# Patient Record
Sex: Female | Born: 1940
Health system: Southern US, Community
[De-identification: ages and names within clinical notes are randomized; demographics above are authoritative.]

## PROBLEM LIST (undated history)

## (undated) DIAGNOSIS — K859 Acute pancreatitis without necrosis or infection, unspecified: Secondary | ICD-10-CM

## (undated) DIAGNOSIS — F329 Major depressive disorder, single episode, unspecified: Secondary | ICD-10-CM

## (undated) DIAGNOSIS — Z8659 Personal history of other mental and behavioral disorders: Secondary | ICD-10-CM

## (undated) DIAGNOSIS — T7840XA Allergy, unspecified, initial encounter: Secondary | ICD-10-CM

## (undated) DIAGNOSIS — I779 Disorder of arteries and arterioles, unspecified: Secondary | ICD-10-CM

## (undated) DIAGNOSIS — F419 Anxiety disorder, unspecified: Secondary | ICD-10-CM

## (undated) DIAGNOSIS — M65849 Other synovitis and tenosynovitis, unspecified hand: Secondary | ICD-10-CM

## (undated) DIAGNOSIS — I701 Atherosclerosis of renal artery: Secondary | ICD-10-CM

## (undated) DIAGNOSIS — M169 Osteoarthritis of hip, unspecified: Secondary | ICD-10-CM

## (undated) DIAGNOSIS — M65839 Other synovitis and tenosynovitis, unspecified forearm: Secondary | ICD-10-CM

## (undated) DIAGNOSIS — K219 Gastro-esophageal reflux disease without esophagitis: Secondary | ICD-10-CM

## (undated) DIAGNOSIS — IMO0001 Reserved for inherently not codable concepts without codable children: Secondary | ICD-10-CM

## (undated) DIAGNOSIS — R32 Unspecified urinary incontinence: Secondary | ICD-10-CM

## (undated) DIAGNOSIS — IMO0002 Reserved for concepts with insufficient information to code with codable children: Secondary | ICD-10-CM

## (undated) DIAGNOSIS — G458 Other transient cerebral ischemic attacks and related syndromes: Secondary | ICD-10-CM

## (undated) DIAGNOSIS — C44310 Basal cell carcinoma of skin of unspecified parts of face: Secondary | ICD-10-CM

## (undated) DIAGNOSIS — F32A Depression, unspecified: Secondary | ICD-10-CM

## (undated) DIAGNOSIS — G5760 Lesion of plantar nerve, unspecified lower limb: Secondary | ICD-10-CM

## (undated) DIAGNOSIS — R0989 Other specified symptoms and signs involving the circulatory and respiratory systems: Secondary | ICD-10-CM

## (undated) DIAGNOSIS — Z8711 Personal history of peptic ulcer disease: Secondary | ICD-10-CM

## (undated) DIAGNOSIS — D649 Anemia, unspecified: Secondary | ICD-10-CM

## (undated) DIAGNOSIS — C50919 Malignant neoplasm of unspecified site of unspecified female breast: Secondary | ICD-10-CM

## (undated) DIAGNOSIS — I739 Peripheral vascular disease, unspecified: Secondary | ICD-10-CM

## (undated) DIAGNOSIS — M67919 Unspecified disorder of synovium and tendon, unspecified shoulder: Secondary | ICD-10-CM

## (undated) DIAGNOSIS — F1021 Alcohol dependence, in remission: Secondary | ICD-10-CM

## (undated) DIAGNOSIS — G629 Polyneuropathy, unspecified: Secondary | ICD-10-CM

## (undated) DIAGNOSIS — Z981 Arthrodesis status: Secondary | ICD-10-CM

## (undated) DIAGNOSIS — R010 Benign and innocent cardiac murmurs: Secondary | ICD-10-CM

## (undated) DIAGNOSIS — F1910 Other psychoactive substance abuse, uncomplicated: Secondary | ICD-10-CM

## (undated) DIAGNOSIS — R911 Solitary pulmonary nodule: Secondary | ICD-10-CM

## (undated) DIAGNOSIS — Z5189 Encounter for other specified aftercare: Secondary | ICD-10-CM

## (undated) DIAGNOSIS — J42 Unspecified chronic bronchitis: Secondary | ICD-10-CM

## (undated) DIAGNOSIS — I10 Essential (primary) hypertension: Secondary | ICD-10-CM

## (undated) DIAGNOSIS — E039 Hypothyroidism, unspecified: Secondary | ICD-10-CM

## (undated) DIAGNOSIS — Z973 Presence of spectacles and contact lenses: Secondary | ICD-10-CM

## (undated) DIAGNOSIS — F988 Other specified behavioral and emotional disorders with onset usually occurring in childhood and adolescence: Secondary | ICD-10-CM

## (undated) DIAGNOSIS — N6009 Solitary cyst of unspecified breast: Secondary | ICD-10-CM

## (undated) HISTORY — DX: Arthrodesis status: Z98.1

## (undated) HISTORY — PX: WISDOM TOOTH EXTRACTION: SHX21

## (undated) HISTORY — DX: Other specified symptoms and signs involving the circulatory and respiratory systems: R09.89

## (undated) HISTORY — DX: Personal history of peptic ulcer disease: Z87.11

## (undated) HISTORY — DX: Solitary pulmonary nodule: R91.1

## (undated) HISTORY — DX: Malignant neoplasm of unspecified site of unspecified female breast: C50.919

## (undated) HISTORY — DX: Other synovitis and tenosynovitis, unspecified forearm: M65.839

## (undated) HISTORY — DX: Alcohol dependence, in remission: F10.21

## (undated) HISTORY — DX: Atherosclerosis of renal artery: I70.1

## (undated) HISTORY — DX: Disorder of arteries and arterioles, unspecified: I77.9

## (undated) HISTORY — DX: Allergy, unspecified, initial encounter: T78.40XA

## (undated) HISTORY — DX: Other specified behavioral and emotional disorders with onset usually occurring in childhood and adolescence: F98.8

## (undated) HISTORY — DX: Encounter for other specified aftercare: Z51.89

## (undated) HISTORY — PX: COLONOSCOPY: SHX174

## (undated) HISTORY — DX: Gastro-esophageal reflux disease without esophagitis: K21.9

## (undated) HISTORY — DX: Acute pancreatitis without necrosis or infection, unspecified: K85.90

## (undated) HISTORY — PX: TONSILLECTOMY AND ADENOIDECTOMY: SUR1326

## (undated) HISTORY — DX: Lesion of plantar nerve, unspecified lower limb: G57.60

## (undated) HISTORY — DX: Other synovitis and tenosynovitis, unspecified hand: M65.849

## (undated) HISTORY — DX: Solitary cyst of unspecified breast: N60.09

## (undated) HISTORY — DX: Osteoarthritis of hip, unspecified: M16.9

## (undated) HISTORY — DX: Hypothyroidism, unspecified: E03.9

## (undated) HISTORY — DX: Personal history of other mental and behavioral disorders: Z86.59

## (undated) HISTORY — PX: OTHER SURGICAL HISTORY: SHX169

## (undated) HISTORY — PX: JOINT REPLACEMENT: SHX530

## (undated) HISTORY — DX: Unspecified urinary incontinence: R32

## (undated) HISTORY — DX: Basal cell carcinoma of skin of unspecified parts of face: C44.310

## (undated) HISTORY — DX: Unspecified chronic bronchitis: J42

## (undated) HISTORY — PX: APPENDECTOMY: SHX54

## (undated) HISTORY — DX: Peripheral vascular disease, unspecified: I73.9

## (undated) HISTORY — DX: Reserved for inherently not codable concepts without codable children: IMO0001

## (undated) HISTORY — DX: Other transient cerebral ischemic attacks and related syndromes: G45.8

## (undated) HISTORY — DX: Essential (primary) hypertension: I10

## (undated) HISTORY — DX: Polyneuropathy, unspecified: G62.9

## (undated) HISTORY — DX: Benign and innocent cardiac murmurs: R01.0

## (undated) HISTORY — DX: Other psychoactive substance abuse, uncomplicated: F19.10

---

## 1997-10-31 ENCOUNTER — Ambulatory Visit (HOSPITAL_COMMUNITY): Admission: RE | Admit: 1997-10-31 | Discharge: 1997-10-31 | Payer: Self-pay | Admitting: *Deleted

## 1998-10-04 ENCOUNTER — Encounter: Payer: Self-pay | Admitting: Emergency Medicine

## 1998-10-04 ENCOUNTER — Emergency Department (HOSPITAL_COMMUNITY): Admission: EM | Admit: 1998-10-04 | Discharge: 1998-10-04 | Payer: Self-pay | Admitting: Emergency Medicine

## 1998-10-09 ENCOUNTER — Ambulatory Visit (HOSPITAL_BASED_OUTPATIENT_CLINIC_OR_DEPARTMENT_OTHER): Admission: RE | Admit: 1998-10-09 | Discharge: 1998-10-09 | Payer: Self-pay | Admitting: Orthopedic Surgery

## 1998-12-08 ENCOUNTER — Other Ambulatory Visit: Admission: RE | Admit: 1998-12-08 | Discharge: 1998-12-08 | Payer: Self-pay | Admitting: Obstetrics and Gynecology

## 1998-12-22 ENCOUNTER — Encounter: Payer: Self-pay | Admitting: Internal Medicine

## 1998-12-22 ENCOUNTER — Emergency Department (HOSPITAL_COMMUNITY): Admission: EM | Admit: 1998-12-22 | Discharge: 1998-12-22 | Payer: Self-pay | Admitting: Emergency Medicine

## 1999-03-16 HISTORY — PX: OTHER SURGICAL HISTORY: SHX169

## 1999-06-17 ENCOUNTER — Ambulatory Visit (HOSPITAL_COMMUNITY): Admission: RE | Admit: 1999-06-17 | Discharge: 1999-06-17 | Payer: Self-pay | Admitting: Family Medicine

## 1999-06-17 ENCOUNTER — Encounter: Payer: Self-pay | Admitting: Family Medicine

## 1999-07-09 ENCOUNTER — Ambulatory Visit (HOSPITAL_COMMUNITY): Admission: RE | Admit: 1999-07-09 | Discharge: 1999-07-09 | Payer: Self-pay | Admitting: Vascular Surgery

## 1999-07-09 ENCOUNTER — Encounter: Payer: Self-pay | Admitting: Vascular Surgery

## 1999-07-17 ENCOUNTER — Observation Stay (HOSPITAL_COMMUNITY): Admission: RE | Admit: 1999-07-17 | Discharge: 1999-07-18 | Payer: Self-pay | Admitting: Vascular Surgery

## 1999-07-17 ENCOUNTER — Encounter: Payer: Self-pay | Admitting: Vascular Surgery

## 1999-07-19 ENCOUNTER — Inpatient Hospital Stay (HOSPITAL_COMMUNITY): Admission: EM | Admit: 1999-07-19 | Discharge: 1999-07-20 | Payer: Self-pay

## 1999-11-13 ENCOUNTER — Encounter: Admission: RE | Admit: 1999-11-13 | Discharge: 1999-11-13 | Payer: Self-pay | Admitting: Vascular Surgery

## 1999-11-17 ENCOUNTER — Encounter: Payer: Self-pay | Admitting: Vascular Surgery

## 1999-11-17 ENCOUNTER — Encounter: Admission: RE | Admit: 1999-11-17 | Discharge: 1999-11-17 | Payer: Self-pay | Admitting: Vascular Surgery

## 1999-12-02 ENCOUNTER — Other Ambulatory Visit: Admission: RE | Admit: 1999-12-02 | Discharge: 1999-12-02 | Payer: Self-pay | Admitting: Obstetrics and Gynecology

## 1999-12-08 ENCOUNTER — Encounter: Admission: RE | Admit: 1999-12-08 | Discharge: 1999-12-08 | Payer: Self-pay | Admitting: Obstetrics and Gynecology

## 1999-12-08 ENCOUNTER — Encounter: Payer: Self-pay | Admitting: Obstetrics and Gynecology

## 2000-12-02 ENCOUNTER — Other Ambulatory Visit: Admission: RE | Admit: 2000-12-02 | Discharge: 2000-12-02 | Payer: Self-pay | Admitting: Obstetrics and Gynecology

## 2000-12-12 ENCOUNTER — Encounter: Payer: Self-pay | Admitting: Obstetrics and Gynecology

## 2000-12-12 ENCOUNTER — Encounter: Admission: RE | Admit: 2000-12-12 | Discharge: 2000-12-12 | Payer: Self-pay | Admitting: Obstetrics and Gynecology

## 2001-05-02 ENCOUNTER — Inpatient Hospital Stay (HOSPITAL_COMMUNITY): Admission: EM | Admit: 2001-05-02 | Discharge: 2001-05-08 | Payer: Self-pay

## 2001-05-02 ENCOUNTER — Encounter: Payer: Self-pay | Admitting: Internal Medicine

## 2001-05-05 ENCOUNTER — Encounter: Payer: Self-pay | Admitting: Internal Medicine

## 2001-06-30 ENCOUNTER — Emergency Department (HOSPITAL_COMMUNITY): Admission: EM | Admit: 2001-06-30 | Discharge: 2001-06-30 | Payer: Self-pay

## 2001-12-19 ENCOUNTER — Other Ambulatory Visit: Admission: RE | Admit: 2001-12-19 | Discharge: 2001-12-19 | Payer: Self-pay | Admitting: Obstetrics and Gynecology

## 2001-12-27 ENCOUNTER — Encounter: Payer: Self-pay | Admitting: Obstetrics and Gynecology

## 2001-12-27 ENCOUNTER — Encounter: Admission: RE | Admit: 2001-12-27 | Discharge: 2001-12-27 | Payer: Self-pay | Admitting: Obstetrics and Gynecology

## 2003-04-03 ENCOUNTER — Encounter: Admission: RE | Admit: 2003-04-03 | Discharge: 2003-04-03 | Payer: Self-pay | Admitting: Obstetrics and Gynecology

## 2003-12-25 ENCOUNTER — Inpatient Hospital Stay (HOSPITAL_COMMUNITY): Admission: RE | Admit: 2003-12-25 | Discharge: 2003-12-29 | Payer: Self-pay | Admitting: Orthopaedic Surgery

## 2004-01-02 ENCOUNTER — Emergency Department (HOSPITAL_COMMUNITY): Admission: EM | Admit: 2004-01-02 | Discharge: 2004-01-03 | Payer: Self-pay | Admitting: Emergency Medicine

## 2004-07-13 ENCOUNTER — Encounter: Payer: Self-pay | Admitting: Internal Medicine

## 2004-07-13 LAB — CONVERTED CEMR LAB

## 2004-07-13 LAB — HM MAMMOGRAPHY

## 2004-07-31 ENCOUNTER — Ambulatory Visit: Payer: Self-pay | Admitting: Family Medicine

## 2004-09-12 HISTORY — PX: LUMBAR FUSION: SHX111

## 2004-10-07 ENCOUNTER — Ambulatory Visit: Payer: Self-pay | Admitting: Internal Medicine

## 2005-01-21 ENCOUNTER — Ambulatory Visit: Payer: Self-pay | Admitting: Internal Medicine

## 2005-03-03 ENCOUNTER — Emergency Department (HOSPITAL_COMMUNITY): Admission: EM | Admit: 2005-03-03 | Discharge: 2005-03-03 | Payer: Self-pay | Admitting: Emergency Medicine

## 2005-03-15 HISTORY — PX: CERVICAL FUSION: SHX112

## 2005-06-10 ENCOUNTER — Encounter: Admission: RE | Admit: 2005-06-10 | Discharge: 2005-06-10 | Payer: Self-pay | Admitting: Orthopaedic Surgery

## 2005-07-08 ENCOUNTER — Ambulatory Visit: Payer: Self-pay | Admitting: Internal Medicine

## 2005-07-14 ENCOUNTER — Inpatient Hospital Stay (HOSPITAL_COMMUNITY): Admission: RE | Admit: 2005-07-14 | Discharge: 2005-07-15 | Payer: Self-pay | Admitting: Orthopaedic Surgery

## 2005-09-17 ENCOUNTER — Ambulatory Visit: Payer: Self-pay | Admitting: Internal Medicine

## 2005-11-06 ENCOUNTER — Emergency Department (HOSPITAL_COMMUNITY): Admission: EM | Admit: 2005-11-06 | Discharge: 2005-11-06 | Payer: Self-pay | Admitting: Family Medicine

## 2005-11-10 ENCOUNTER — Ambulatory Visit: Payer: Self-pay | Admitting: Internal Medicine

## 2005-11-20 ENCOUNTER — Emergency Department (HOSPITAL_COMMUNITY): Admission: EM | Admit: 2005-11-20 | Discharge: 2005-11-20 | Payer: Self-pay | Admitting: Family Medicine

## 2005-11-25 ENCOUNTER — Ambulatory Visit: Payer: Self-pay | Admitting: Internal Medicine

## 2005-11-29 ENCOUNTER — Ambulatory Visit: Payer: Self-pay | Admitting: Cardiology

## 2005-11-29 ENCOUNTER — Ambulatory Visit: Payer: Self-pay | Admitting: Internal Medicine

## 2006-02-15 ENCOUNTER — Ambulatory Visit: Payer: Self-pay | Admitting: Internal Medicine

## 2006-02-16 ENCOUNTER — Emergency Department (HOSPITAL_COMMUNITY): Admission: EM | Admit: 2006-02-16 | Discharge: 2006-02-16 | Payer: Self-pay | Admitting: Emergency Medicine

## 2006-03-15 HISTORY — PX: TOTAL HIP ARTHROPLASTY: SHX124

## 2006-03-25 ENCOUNTER — Ambulatory Visit: Payer: Self-pay | Admitting: Internal Medicine

## 2006-04-19 ENCOUNTER — Ambulatory Visit: Payer: Self-pay | Admitting: Internal Medicine

## 2006-04-19 LAB — CONVERTED CEMR LAB: TSH: 0.07 microintl units/mL — ABNORMAL LOW (ref 0.35–5.50)

## 2006-05-10 ENCOUNTER — Encounter: Admission: RE | Admit: 2006-05-10 | Discharge: 2006-05-10 | Payer: Self-pay | Admitting: Obstetrics and Gynecology

## 2006-06-15 ENCOUNTER — Ambulatory Visit: Payer: Self-pay | Admitting: Internal Medicine

## 2006-06-21 ENCOUNTER — Ambulatory Visit: Payer: Self-pay | Admitting: Internal Medicine

## 2006-07-18 ENCOUNTER — Encounter: Admission: RE | Admit: 2006-07-18 | Discharge: 2006-07-18 | Payer: Self-pay | Admitting: Orthopaedic Surgery

## 2006-08-09 ENCOUNTER — Encounter: Payer: Self-pay | Admitting: Internal Medicine

## 2006-08-09 DIAGNOSIS — R011 Cardiac murmur, unspecified: Secondary | ICD-10-CM | POA: Insufficient documentation

## 2006-08-09 DIAGNOSIS — I10 Essential (primary) hypertension: Secondary | ICD-10-CM | POA: Insufficient documentation

## 2006-08-09 DIAGNOSIS — K219 Gastro-esophageal reflux disease without esophagitis: Secondary | ICD-10-CM | POA: Insufficient documentation

## 2007-02-13 ENCOUNTER — Encounter: Payer: Self-pay | Admitting: Internal Medicine

## 2007-02-21 ENCOUNTER — Encounter: Payer: Self-pay | Admitting: *Deleted

## 2007-02-21 DIAGNOSIS — C44300 Unspecified malignant neoplasm of skin of unspecified part of face: Secondary | ICD-10-CM | POA: Insufficient documentation

## 2007-02-21 DIAGNOSIS — E039 Hypothyroidism, unspecified: Secondary | ICD-10-CM | POA: Insufficient documentation

## 2007-02-21 DIAGNOSIS — N6009 Solitary cyst of unspecified breast: Secondary | ICD-10-CM | POA: Insufficient documentation

## 2007-02-21 DIAGNOSIS — Z8711 Personal history of peptic ulcer disease: Secondary | ICD-10-CM | POA: Insufficient documentation

## 2007-02-21 DIAGNOSIS — F988 Other specified behavioral and emotional disorders with onset usually occurring in childhood and adolescence: Secondary | ICD-10-CM | POA: Insufficient documentation

## 2007-02-21 DIAGNOSIS — Z8659 Personal history of other mental and behavioral disorders: Secondary | ICD-10-CM | POA: Insufficient documentation

## 2007-02-21 DIAGNOSIS — C44309 Unspecified malignant neoplasm of skin of other parts of face: Secondary | ICD-10-CM | POA: Insufficient documentation

## 2007-02-21 DIAGNOSIS — Z9089 Acquired absence of other organs: Secondary | ICD-10-CM | POA: Insufficient documentation

## 2007-02-21 DIAGNOSIS — R0989 Other specified symptoms and signs involving the circulatory and respiratory systems: Secondary | ICD-10-CM | POA: Insufficient documentation

## 2007-02-21 DIAGNOSIS — G5760 Lesion of plantar nerve, unspecified lower limb: Secondary | ICD-10-CM | POA: Insufficient documentation

## 2007-04-25 ENCOUNTER — Encounter: Admission: RE | Admit: 2007-04-25 | Discharge: 2007-04-25 | Payer: Self-pay | Admitting: Orthopaedic Surgery

## 2007-05-04 ENCOUNTER — Telehealth: Payer: Self-pay | Admitting: Internal Medicine

## 2007-05-05 ENCOUNTER — Encounter: Payer: Self-pay | Admitting: Internal Medicine

## 2007-06-26 ENCOUNTER — Encounter: Payer: Self-pay | Admitting: Internal Medicine

## 2007-09-05 ENCOUNTER — Telehealth: Payer: Self-pay | Admitting: Internal Medicine

## 2007-09-07 ENCOUNTER — Telehealth: Payer: Self-pay | Admitting: Internal Medicine

## 2007-09-08 ENCOUNTER — Telehealth: Payer: Self-pay | Admitting: Internal Medicine

## 2007-10-11 ENCOUNTER — Ambulatory Visit: Payer: Self-pay | Admitting: Internal Medicine

## 2007-10-11 DIAGNOSIS — IMO0001 Reserved for inherently not codable concepts without codable children: Secondary | ICD-10-CM | POA: Insufficient documentation

## 2007-10-11 DIAGNOSIS — I701 Atherosclerosis of renal artery: Secondary | ICD-10-CM | POA: Insufficient documentation

## 2007-10-20 ENCOUNTER — Telehealth: Payer: Self-pay | Admitting: Internal Medicine

## 2007-12-25 ENCOUNTER — Telehealth: Payer: Self-pay | Admitting: Internal Medicine

## 2008-01-11 ENCOUNTER — Telehealth: Payer: Self-pay | Admitting: Internal Medicine

## 2008-01-23 ENCOUNTER — Telehealth (INDEPENDENT_AMBULATORY_CARE_PROVIDER_SITE_OTHER): Payer: Self-pay | Admitting: *Deleted

## 2008-01-23 ENCOUNTER — Ambulatory Visit: Payer: Self-pay | Admitting: Internal Medicine

## 2008-01-23 DIAGNOSIS — T8484XA Pain due to internal orthopedic prosthetic devices, implants and grafts, initial encounter: Secondary | ICD-10-CM | POA: Insufficient documentation

## 2008-01-23 DIAGNOSIS — Z96649 Presence of unspecified artificial hip joint: Secondary | ICD-10-CM

## 2008-01-29 ENCOUNTER — Encounter (INDEPENDENT_AMBULATORY_CARE_PROVIDER_SITE_OTHER): Payer: Self-pay | Admitting: *Deleted

## 2008-01-29 ENCOUNTER — Telehealth: Payer: Self-pay | Admitting: Internal Medicine

## 2008-02-05 ENCOUNTER — Encounter: Payer: Self-pay | Admitting: Internal Medicine

## 2008-02-27 ENCOUNTER — Telehealth: Payer: Self-pay | Admitting: Internal Medicine

## 2008-03-15 HISTORY — PX: ORIF TIBIA FRACTURE: SHX5416

## 2008-03-19 ENCOUNTER — Encounter: Payer: Self-pay | Admitting: Internal Medicine

## 2008-03-25 ENCOUNTER — Encounter: Admission: RE | Admit: 2008-03-25 | Discharge: 2008-04-30 | Payer: Self-pay | Admitting: Neurology

## 2008-03-26 ENCOUNTER — Telehealth: Payer: Self-pay | Admitting: Internal Medicine

## 2008-04-03 ENCOUNTER — Telehealth: Payer: Self-pay | Admitting: Internal Medicine

## 2008-04-10 ENCOUNTER — Encounter: Payer: Self-pay | Admitting: Internal Medicine

## 2008-04-12 ENCOUNTER — Telehealth: Payer: Self-pay | Admitting: Internal Medicine

## 2008-04-19 ENCOUNTER — Telehealth: Payer: Self-pay | Admitting: Internal Medicine

## 2008-04-24 ENCOUNTER — Encounter: Payer: Self-pay | Admitting: Internal Medicine

## 2008-05-01 ENCOUNTER — Encounter: Admission: RE | Admit: 2008-05-01 | Discharge: 2008-06-27 | Payer: Self-pay | Admitting: Orthopedic Surgery

## 2008-05-08 ENCOUNTER — Telehealth: Payer: Self-pay | Admitting: Internal Medicine

## 2008-06-05 ENCOUNTER — Encounter: Payer: Self-pay | Admitting: Internal Medicine

## 2008-07-16 ENCOUNTER — Encounter: Payer: Self-pay | Admitting: Internal Medicine

## 2008-07-17 ENCOUNTER — Telehealth: Payer: Self-pay | Admitting: Internal Medicine

## 2008-07-24 ENCOUNTER — Encounter: Payer: Self-pay | Admitting: Internal Medicine

## 2008-07-29 ENCOUNTER — Encounter: Admission: RE | Admit: 2008-07-29 | Discharge: 2008-08-27 | Payer: Self-pay | Admitting: Neurology

## 2008-08-02 ENCOUNTER — Encounter: Admission: RE | Admit: 2008-08-02 | Discharge: 2008-08-02 | Payer: Self-pay | Admitting: Neurology

## 2008-08-16 ENCOUNTER — Encounter: Payer: Self-pay | Admitting: Internal Medicine

## 2008-08-22 ENCOUNTER — Telehealth: Payer: Self-pay | Admitting: Internal Medicine

## 2008-09-03 ENCOUNTER — Telehealth (INDEPENDENT_AMBULATORY_CARE_PROVIDER_SITE_OTHER): Payer: Self-pay | Admitting: *Deleted

## 2008-09-12 HISTORY — PX: SHOULDER ARTHROSCOPY: SHX128

## 2008-09-18 ENCOUNTER — Ambulatory Visit: Payer: Self-pay | Admitting: Internal Medicine

## 2008-09-18 LAB — CONVERTED CEMR LAB
Cholesterol: 194 mg/dL (ref 0–200)
HDL: 59.2 mg/dL (ref >39.00)
LDL Cholesterol: 117 mg/dL — ABNORMAL HIGH (ref 0–99)
Total CHOL/HDL Ratio: 3
Triglycerides: 91 mg/dL (ref 0.0–149.0)
VLDL: 18.2 mg/dL (ref 0.0–40.0)

## 2008-09-20 ENCOUNTER — Ambulatory Visit: Payer: Self-pay | Admitting: Internal Medicine

## 2008-09-20 DIAGNOSIS — G458 Other transient cerebral ischemic attacks and related syndromes: Secondary | ICD-10-CM | POA: Insufficient documentation

## 2008-09-21 DIAGNOSIS — F1021 Alcohol dependence, in remission: Secondary | ICD-10-CM | POA: Insufficient documentation

## 2008-09-21 DIAGNOSIS — I6529 Occlusion and stenosis of unspecified carotid artery: Secondary | ICD-10-CM | POA: Insufficient documentation

## 2008-09-25 ENCOUNTER — Telehealth: Payer: Self-pay | Admitting: Internal Medicine

## 2008-09-28 ENCOUNTER — Encounter: Payer: Self-pay | Admitting: Internal Medicine

## 2008-09-30 ENCOUNTER — Encounter: Payer: Self-pay | Admitting: Internal Medicine

## 2008-10-01 ENCOUNTER — Ambulatory Visit (HOSPITAL_COMMUNITY): Admission: RE | Admit: 2008-10-01 | Discharge: 2008-10-01 | Payer: Self-pay | Admitting: Orthopedic Surgery

## 2008-10-09 ENCOUNTER — Encounter: Payer: Self-pay | Admitting: Internal Medicine

## 2008-10-17 ENCOUNTER — Encounter: Admission: RE | Admit: 2008-10-17 | Discharge: 2009-01-15 | Payer: Self-pay | Admitting: Orthopedic Surgery

## 2008-10-30 ENCOUNTER — Telehealth: Payer: Self-pay | Admitting: Internal Medicine

## 2008-11-01 ENCOUNTER — Encounter: Payer: Self-pay | Admitting: Internal Medicine

## 2008-11-07 ENCOUNTER — Telehealth: Payer: Self-pay | Admitting: Internal Medicine

## 2008-11-11 ENCOUNTER — Encounter: Payer: Self-pay | Admitting: Internal Medicine

## 2008-12-12 ENCOUNTER — Encounter: Payer: Self-pay | Admitting: Internal Medicine

## 2008-12-12 ENCOUNTER — Ambulatory Visit: Payer: Self-pay | Admitting: Vascular Surgery

## 2008-12-23 ENCOUNTER — Encounter: Payer: Self-pay | Admitting: Internal Medicine

## 2009-01-17 ENCOUNTER — Encounter: Payer: Self-pay | Admitting: Internal Medicine

## 2009-01-24 ENCOUNTER — Encounter: Payer: Self-pay | Admitting: Internal Medicine

## 2009-01-30 ENCOUNTER — Telehealth: Payer: Self-pay | Admitting: Internal Medicine

## 2009-02-12 ENCOUNTER — Encounter: Payer: Self-pay | Admitting: Internal Medicine

## 2009-02-18 ENCOUNTER — Encounter: Payer: Self-pay | Admitting: Internal Medicine

## 2009-02-19 ENCOUNTER — Emergency Department (HOSPITAL_BASED_OUTPATIENT_CLINIC_OR_DEPARTMENT_OTHER): Admission: EM | Admit: 2009-02-19 | Discharge: 2009-02-19 | Payer: Self-pay | Admitting: Emergency Medicine

## 2009-02-25 ENCOUNTER — Telehealth: Payer: Self-pay | Admitting: Internal Medicine

## 2009-04-24 ENCOUNTER — Encounter: Payer: Self-pay | Admitting: Internal Medicine

## 2009-04-24 ENCOUNTER — Ambulatory Visit: Payer: Self-pay | Admitting: Vascular Surgery

## 2009-05-05 ENCOUNTER — Emergency Department (HOSPITAL_BASED_OUTPATIENT_CLINIC_OR_DEPARTMENT_OTHER)
Admission: EM | Admit: 2009-05-05 | Discharge: 2009-05-05 | Payer: Self-pay | Source: Home / Self Care | Admitting: Emergency Medicine

## 2009-05-05 ENCOUNTER — Ambulatory Visit: Payer: Self-pay | Admitting: Diagnostic Radiology

## 2009-05-07 ENCOUNTER — Encounter: Payer: Self-pay | Admitting: Internal Medicine

## 2009-05-15 ENCOUNTER — Telehealth: Payer: Self-pay | Admitting: Internal Medicine

## 2009-05-16 ENCOUNTER — Telehealth: Payer: Self-pay | Admitting: Internal Medicine

## 2009-06-11 ENCOUNTER — Telehealth: Payer: Self-pay | Admitting: Internal Medicine

## 2009-06-17 ENCOUNTER — Emergency Department (HOSPITAL_BASED_OUTPATIENT_CLINIC_OR_DEPARTMENT_OTHER): Admission: EM | Admit: 2009-06-17 | Discharge: 2009-06-17 | Payer: Self-pay | Admitting: Emergency Medicine

## 2009-06-17 ENCOUNTER — Ambulatory Visit: Payer: Self-pay | Admitting: Diagnostic Radiology

## 2009-06-20 ENCOUNTER — Ambulatory Visit (HOSPITAL_BASED_OUTPATIENT_CLINIC_OR_DEPARTMENT_OTHER): Admission: RE | Admit: 2009-06-20 | Discharge: 2009-06-20 | Payer: Self-pay | Admitting: Orthopedic Surgery

## 2009-06-27 ENCOUNTER — Encounter: Admission: RE | Admit: 2009-06-27 | Discharge: 2009-09-25 | Payer: Self-pay | Admitting: Orthopedic Surgery

## 2009-06-30 ENCOUNTER — Encounter: Payer: Self-pay | Admitting: Internal Medicine

## 2009-07-25 ENCOUNTER — Telehealth: Payer: Self-pay | Admitting: Internal Medicine

## 2009-08-04 ENCOUNTER — Encounter: Payer: Self-pay | Admitting: Internal Medicine

## 2009-08-05 ENCOUNTER — Ambulatory Visit (HOSPITAL_BASED_OUTPATIENT_CLINIC_OR_DEPARTMENT_OTHER): Admission: RE | Admit: 2009-08-05 | Discharge: 2009-08-05 | Payer: Self-pay | Admitting: Orthopedic Surgery

## 2009-08-12 ENCOUNTER — Ambulatory Visit (HOSPITAL_COMMUNITY): Admission: RE | Admit: 2009-08-12 | Discharge: 2009-08-12 | Payer: Self-pay | Admitting: Orthopedic Surgery

## 2009-09-11 ENCOUNTER — Encounter: Payer: Self-pay | Admitting: Internal Medicine

## 2009-11-06 ENCOUNTER — Telehealth (INDEPENDENT_AMBULATORY_CARE_PROVIDER_SITE_OTHER): Payer: Self-pay | Admitting: *Deleted

## 2009-11-10 ENCOUNTER — Telehealth: Payer: Self-pay | Admitting: Internal Medicine

## 2009-11-10 ENCOUNTER — Encounter: Payer: Self-pay | Admitting: Internal Medicine

## 2009-11-21 ENCOUNTER — Ambulatory Visit: Payer: Self-pay | Admitting: Internal Medicine

## 2009-11-21 ENCOUNTER — Ambulatory Visit: Payer: Self-pay | Admitting: Vascular Surgery

## 2009-11-21 LAB — CONVERTED CEMR LAB
ALT: 29 units/L (ref 0–35)
AST: 17 units/L (ref 0–37)
Albumin: 3.9 g/dL (ref 3.5–5.2)
Alkaline Phosphatase: 64 units/L (ref 39–117)
BUN: 21 mg/dL (ref 6–23)
Basophils Absolute: 0 10*3/uL (ref 0.0–0.1)
Basophils Relative: 0.3 % (ref 0.0–3.0)
Bilirubin, Direct: 0.1 mg/dL (ref 0.0–0.3)
CO2: 32 meq/L (ref 19–32)
Calcium: 9.3 mg/dL (ref 8.4–10.5)
Chloride: 103 meq/L (ref 96–112)
Cholesterol: 205 mg/dL — ABNORMAL HIGH (ref 0–200)
Creatinine, Ser: 0.9 mg/dL (ref 0.4–1.2)
Direct LDL: 124.5 mg/dL
Eosinophils Absolute: 0.2 10*3/uL (ref 0.0–0.7)
Eosinophils Relative: 2.1 % (ref 0.0–5.0)
GFR calc non Af Amer: 63.15 mL/min (ref >60)
Glucose, Bld: 79 mg/dL (ref 70–99)
HCT: 35.9 % — ABNORMAL LOW (ref 36.0–46.0)
HDL: 56.1 mg/dL (ref >39.00)
Hemoglobin: 12.4 g/dL (ref 12.0–15.0)
Lymphocytes Relative: 30.8 % (ref 12.0–46.0)
Lymphs Abs: 2.2 10*3/uL (ref 0.7–4.0)
MCHC: 34.5 g/dL (ref 30.0–36.0)
MCV: 97.3 fL (ref 78.0–100.0)
Monocytes Absolute: 0.7 10*3/uL (ref 0.1–1.0)
Monocytes Relative: 9.4 % (ref 3.0–12.0)
Neutro Abs: 4.2 10*3/uL (ref 1.4–7.7)
Neutrophils Relative %: 57.4 % (ref 43.0–77.0)
Platelets: 183 10*3/uL (ref 150.0–400.0)
Potassium: 4.4 meq/L (ref 3.5–5.1)
RBC: 3.69 M/uL — ABNORMAL LOW (ref 3.87–5.11)
RDW: 14.9 % — ABNORMAL HIGH (ref 11.5–14.6)
Sed Rate: 11 mm/hr (ref 0–22)
Sodium: 141 meq/L (ref 135–145)
TSH: 0.47 microintl units/mL (ref 0.35–5.50)
Total Bilirubin: 0.7 mg/dL (ref 0.3–1.2)
Total CHOL/HDL Ratio: 4
Total Protein: 6.3 g/dL (ref 6.0–8.3)
Triglycerides: 125 mg/dL (ref 0.0–149.0)
VLDL: 25 mg/dL (ref 0.0–40.0)
WBC: 7.3 10*3/uL (ref 4.5–10.5)

## 2009-11-25 ENCOUNTER — Ambulatory Visit: Payer: Self-pay | Admitting: Internal Medicine

## 2009-11-25 DIAGNOSIS — G609 Hereditary and idiopathic neuropathy, unspecified: Secondary | ICD-10-CM | POA: Insufficient documentation

## 2009-11-26 DIAGNOSIS — M65849 Other synovitis and tenosynovitis, unspecified hand: Secondary | ICD-10-CM

## 2009-11-26 DIAGNOSIS — M65839 Other synovitis and tenosynovitis, unspecified forearm: Secondary | ICD-10-CM | POA: Insufficient documentation

## 2009-11-28 ENCOUNTER — Encounter: Admission: RE | Admit: 2009-11-28 | Discharge: 2009-11-28 | Payer: Self-pay | Admitting: Orthopaedic Surgery

## 2010-04-05 ENCOUNTER — Encounter: Payer: Self-pay | Admitting: Internal Medicine

## 2010-04-05 ENCOUNTER — Encounter: Payer: Self-pay | Admitting: Obstetrics and Gynecology

## 2010-04-06 ENCOUNTER — Encounter: Payer: Self-pay | Admitting: Neurology

## 2010-04-08 ENCOUNTER — Encounter
Admission: RE | Admit: 2010-04-08 | Discharge: 2010-04-08 | Payer: Self-pay | Source: Home / Self Care | Attending: Obstetrics and Gynecology | Admitting: Obstetrics and Gynecology

## 2010-04-14 NOTE — Progress Notes (Signed)
Summary: LAB ORDER  Phone Note Call from Patient   Summary of Call: Patient is requesting order for labs. Especially for thyroid. Is this ok? What labs are pt's due for?  Initial call taken by: Lamar Sprinkles, CMA,  November 06, 2009 4:48 PM  Follow-up for Phone Call        last OV July '10. Had labs Nov '10.  1) needs annual exam 2) TSH 244.9, metabolic 401.9, CBC 530.81, hepatic V11.3, ESR 729.1 Follow-up by: Jacques Navy MD,  November 06, 2009 5:00 PM  Additional Follow-up for Phone Call Additional follow up Details #1::        left mess to call office back.............Marland KitchenLamar Sprinkles, CMA  November 06, 2009 5:12 PM   Patient is requesting to have cholesterol added to above orders. OK?  Additional Follow-up by: Lamar Sprinkles, CMA,  November 19, 2009 12:08 PM    Additional Follow-up for Phone Call Additional follow up Details #2::    THE LABS ARE IN [facility].  YEARLY FU IS ON SEPT 13.  LMOM FOR HER TO CALL SO WE CAN TELL HER TO GO TO THE LAB BEFORE THE APPT. Follow-up by: Hilarie Fredrickson,  November 13, 2009 10:57 AM  Additional Follow-up for Phone Call Additional follow up Details #3:: Details for Additional Follow-up Action Taken: ok for lipid 443.10  Additional Follow-up by: Jacques Navy MD,  November 19, 2009 3:30 PM

## 2010-04-14 NOTE — Letter (Signed)
Summary: [facility]  [facility]   Imported By: Sherian Rein 05/16/2009 07:44:34  _____________________________________________________________________  External Attachment:    Type:   Image     Comment:   External Document

## 2010-04-14 NOTE — Progress Notes (Signed)
Summary: DIOVAN RX  Phone Note Refill Request   Refills Requested: Medication #1:  DIOVAN 160 MG TABS 1 by mouth once daily MAIL RX for 90 day supply to pt's home, she wants to get med thru [facility].   Initial call taken by: Lamar Sprinkles, CMA,  Jul 25, 2009 3:14 PM  Follow-up for Phone Call        k, we will provide written Rx for her to fill where ever she chooses. Follow-up by: Jacques Navy MD,  Jul 28, 2009 5:01 AM  Additional Follow-up for Phone Call Additional follow up Details #1::        Mailed Additional Follow-up by: Lamar Sprinkles, CMA,  Jul 28, 2009 11:53 AM    Prescriptions: DIOVAN 160 MG TABS (VALSARTAN) 1 by mouth once daily  #90 x 3   Entered by:   Lamar Sprinkles, CMA   Authorized by:   Jacques Navy MD   Signed by:   Lamar Sprinkles, CMA on 07/28/2009   Method used:   Print then Give to Patient   RxID:   [id number]

## 2010-04-14 NOTE — Assessment & Plan Note (Signed)
Summary: YEARLY FU /MEDICARE/ LABS NEXT DAY/DIDN'T WANT TO WAIT FOR A ...   Vital Signs:  Patient profile:   70 year old female Height:      65 inches Weight:      121.50 pounds BMI:     20.29 O2 Sat:      96 % on Room air Temp:     97.8 degrees F oral Pulse rate:   76 / minute BP sitting:   128 / 62  (left arm) Cuff size:   regular  Vitals Entered By: Rose [name] CMA (November 25, 2009 2:53 PM)  O2 Flow:  Room air CC: pt here for cpx/ ab Comments Pt is due for eye exam   Primary Care Provider:  Tamberlyn [name]  CC:  pt here for cpx/ ab.  History of Present Illness: patient presents for a wellness/prevention exam and routine medical follow-up.   she is generally feeling well except for left hip pain. She reports that she has severe pain with limited walking. the pain is in the left hip which has been replaced. she has had x-rays - no abnormality. She has had intra-articular injection - no help; oral steroids - no help. she is sheduled for MRI and consult with Rose [name].  Peripheral neuropathy - she gets good relief with gabapentin and lyrica which she says works well in combination.   Feb '10 - follow up with Dr. Edilia [name] for Carotid artery disease: doppler without progressive disease - 40-59% occlusion bilaterally  Hand surgeon for injection for right thumb tenosynovitis Nov and Dec '10  Despite her hip pain she reamins 100% independent in ADLs. She shops, pays her bills, balances her check book and has normal cognitive function.   Preventive Screening-Counseling & Management  Alcohol-Tobacco     Alcohol drinks/day: 0     Smoking Status: quit  Caffeine-Diet-Exercise     Diet Comments: healthy     Does Patient Exercise: physical therapy     MSH Depression Score: not depressed  Hep-HIV-STD-Contraception     Hepatitis Risk: no risk noted     HIV Risk: no risk noted     Dental Visit-last 6 months yes     Sun Exposure-Excessive: no  Safety-Violence-Falls     Seat Belt Use:  yes     Firearms in the Home: no firearms in the home     Smoke Detectors: yes     Sexual Abuse: no     Fall Risk: moderate with hip pain     Fall Risk Counseling: yes  Current Medications (verified): 1)  Aspirin 81 Mg Tbec (Aspirin) .... Take 1 Tablet By Mouth Two Times A Day 2)  Detrol La 4 Mg Cp24 (Tolterodine Tartrate) .... Take 1 Capsule By Mouth Once A Day 3)  Glucosamine Sulfate 500 Mg Tabs (Glucosamine Sulfate) .... Take 1 Tablet By Mouth Two Times A Day 4)  Nexium 40 Mg Cpdr (Esomeprazole Magnesium) .Marland Kitchen.. 1 Capsule By Mouth Once A Day On M, W, F. 5)  Amlodipine Besylate 10 Mg Tabs (Amlodipine Besylate) .Marland Kitchen.. 1 By Mouth Once Daily 6)  Skelaxin 800 Mg Tabs (Metaxalone) .... Take 1 Tablet By Mouth Three Times A Day 7)  Sm Calcium/vitamin D 500-200 Mg-Unit Tabs (Calcium Carbonate-Vitamin D) .... Take 2 Tablet By Mouth Once A Day 8)  Synthroid 125 Mcg Tabs (Levothyroxine Sodium) .Marland Kitchen.. 1 By Mouth Once Daily 9)  Wellbutrin Xl 300 Mg Tb24 (Bupropion Hcl) .... Take 1 Tablet By Mouth Once A Day 10)  Diovan  160 Mg Tabs (Valsartan) .Marland Kitchen.. 1 By Mouth Once Daily 11)  Cymbalta 60 Mg Cpep (Duloxetine Hcl) .... Take 1 Tablet By Mouth Once A Day 12)  Furosemide 40 Mg Tabs (Furosemide) .Marland Kitchen.. 1 By Mouth Once Daily 13)  Fish Oil Otc .... Take 1 Cap By Mouth Three Times A Day 14)  Lyrica 100 Mg Caps (Pregabalin) .... Take 1 Tablet By Mouth Three Times A Day 15)  Neurontin 600 Mg Tabs (Gabapentin) .... Take 2 Tablet By Mouth Three Times A Day 16)  Slow Fe 160 (50 Fe) Mg Cr-Tabs (Ferrous Sulfate Dried) 17)  Wellbutrin Xl 150 Mg Xr24h-Tab (Bupropion Hcl) .... 1/2 At Bedtime  Allergies (verified): 1)  ! Vicodin 2)  ! 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 3 daughters - '61, '70, '71; 2 sons - '53, '55 (schizophrenic-died); 10 grandchildren retired Secretary/administrator, had own firm Lives alone with her two dogs.  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) CAROTID ARTERY DISEASE (ICD-433.10) ALCOHOL ABUSE, HX OF (ICD-V11.3) DEGENERATIVE JOINT DISEASE, LEFT HIP (ICD-715.95) FIBROMYALGIA (ICD-729.1) RENAL ARTERY STENOSIS (ICD-440.1) DEPRESSION, HX OF (ICD-V11.8) MORTON'S NEUROMA, LEFT (ICD-355.6) CAROTID BRUIT, LEFT (ICD-785.9) * SPINAL FUSION SURGERY DUODENAL ULCER, HX OF (ICD-V12.71) Hx of BREAST CYST (ICD-610.0) CARCINOMA, BASAL CELL, FACE (ICD-173.3) ATTENTION DEFICIT DISORDER, ADULT (ICD-314.00) HYPOTHYROIDISM (ICD-244.9) * LEFT BRACHIAL ARTERY REPAIR OF PSEUDOANEURYSM * PERCUTANOUS TRANSLUMINAL ANGIOPLASTY OF RENAL ARTERIES HEART MURMUR, BENIGN (ICD-785.2) GERD (ICD-530.81) HYPERTENSION (ICD-401.9)   * LEFT BRACHIAL ARTERY REPAIR OF PSEUDOANEURYSM * PERCUTANOUS TRANSLUMINAL ANGIOPLASTY OF RENAL ARTERIES HEART MURMUR, BENIGN (ICD-785.2) GERD (ICD-530.81) HYPERTENSION (ICD-401.9)        Past Surgical History: APPENDECTOMY, HX OF (ICD-V45.79) TONSILLECTOMY AND ADENOIDECTOMY, HX OF (ICD-V45.79) * LEFT BRACHIAL ARTERY REPAIR OF PSEUDOANEURYSM * PERCUTANOUS TRANSLUMINAL ANGIOPLASTY OF RENAL ARTERIES Lumbar fusion July '06  T12-L5 (Rose [name]) Cervical fusion '07 (Rose [name]) Total hip replacement '09 Left Shoulder Arthroscopy July '10 ORIF left distal tibial fx '10  Social History: Does Patient Exercise:  physical therapy Hepatitis Risk:  no risk noted HIV Risk:  no risk noted Dental Care w/in 6 mos.:  yes Sun Exposure-Excessive:  no Seat Belt Use:  yes Fall Risk:  moderate with hip pain  Review of Systems       The patient complains of decreased hearing.  The patient denies  anorexia, fever, weight loss, weight gain, vision loss, hoarseness, chest pain, syncope, dyspnea on exertion, peripheral edema, prolonged cough, headaches, abdominal pain, severe indigestion/heartburn, hematuria, incontinence, muscle weakness, suspicious skin lesions, transient blindness, difficulty walking, depression, unusual weight change, abnormal bleeding, enlarged lymph nodes, angioedema, and breast masses.    Physical Exam  General:  Thin white female i no distress Head:  normocephalic, atraumatic, and no abnormalities observed.   Eyes:  vision grossly intact, pupils equal, pupils round, corneas and lenses clear, and no injection.   Ears:  External ear exam shows no significant lesions or deformities.  Otoscopic examination reveals clear canals, tympanic membranes are intact bilaterally without bulging, retraction, inflammation or discharge. Hearing is grossly normal bilaterally. Nose:  no external deformity and no external erythema.   Mouth:  Oral mucosa and oropharynx without lesions or exudates.  Teeth in good repair. Neck:  supple, full  ROM, and no thyromegaly.   Chest Wall:  no deformities and no tenderness.   Breasts:  deferred to gyn Lungs:  Normal respiratory effort, chest expands symmetrically. Lungs are clear to auscultation, no crackles or wheezes. Heart:  Normal rate and regular rhythm. S1 and S2 normal without gallop, murmur, click, rub or other extra sounds. Abdomen:  soft, non-tender, normal bowel sounds, no guarding, no rebound tenderness, and no hepatomegaly.   Genitalia:  deferred Msk:  no joint tenderness, no joint swelling, no joint warmth, and no joint deformities.   Pulses:  2+ radial pulse Extremities:  no peripheral edema, no obvious deformity Neurologic:  alert & oriented X3, cranial nerves II-XII intact, strength normal in all extremities, gait normal, and DTRs symmetrical and normal.   Skin:  turgor normal, color normal, no suspicious lesions, and no  ulcerations.   Cervical Nodes:  no anterior cervical adenopathy and no posterior cervical adenopathy.   Psych:  Oriented X3, memory intact for recent and remote, normally interactive, good eye contact, and not anxious appearing.     Impression & Recommendations:  Problem # 1:  PERIPHERAL NEUROPATHY (ICD-356.9) A stable problem.She does get better relief with the combination of gabapentin and lyrica. She has seen Dr. Sandria Manly in the past and is also seeing a pain management physician who prescribes the lyrica.  Problem # 2:  CAROTID ARTERY DISEASE (ICD-433.10) Current with follow-up to Dr. Edilia [name] and with annual carotid dopplers.  Her updated medication list for this problem includes:    Aspirin 81 Mg Tbec (Aspirin) .Marland Kitchen... Take 1 tablet by mouth two times a day  Problem # 3:  DEGENERATIVE JOINT DISEASE, LEFT HIP (ICD-715.95) Being evaluated for on-going pain. Did suggest that if her work-up was negative that the diagnosis of piriformis syndrom be entertained. This may respond to PT  Her updated medication list for this problem includes:    Aspirin 81 Mg Tbec (Aspirin) .Marland Kitchen... Take 1 tablet by mouth two times a day  Problem # 4:  ATTENTION DEFICIT DISORDER, ADULT (ICD-314.00) Patient on multiple meds prescribed by Dr. Nolen Mu. She seems to be doing well.  Problem # 5:  HYPOTHYROIDISM (ICD-244.9) Lab results show normal range TSH - will continue present dose of synthroid.   Her updated medication list for this problem includes:    Synthroid 125 Mcg Tabs (Levothyroxine sodium) .Marland Kitchen... 1 by mouth once daily  Problem # 6:  HYPERTENSION (ICD-401.9)  Her updated medication list for this problem includes:    Amlodipine Besylate 10 Mg Tabs (Amlodipine besylate) .Marland Kitchen... 1 by mouth once daily    Diovan 160 Mg Tabs (Valsartan) .Marland Kitchen... 1 by mouth once daily    Furosemide 40 Mg Tabs (Furosemide) .Marland Kitchen... 1 by mouth once daily  BP today: 128/62 Prior BP: 104/52 (09/20/2008)  Labs Reviewed: K+: 4.4  (11/21/2009) Creat: : 0.9 (11/21/2009)     Good control on present medical regimen.   Problem # 7:  Preventive Health Care (ICD-V70.0)  Mulitple orthopedic issues currently being investigated. Medically she has been stable. Lab results are stable with an excellent lipid panel. Her exam in unremarkable with gyn/breast exam deferred to gynecology. Immunizations-Tetnus today, pneumonia vaccine '09. Recommended shingles vaccine. Last colonosocpy '04. Last mammogram on record is '06 - need current reports.   Patient has no active signs of depression. She is independent in ADLs and highly functional (see HPI).  Counseled on End of LIfe Care and provided her packet on End of LIfe planning. She was educated as to  the approximate statistics involved in resuscitation.  In summary- a complicated woman who does appear to be medically stable at this exam.  Orders: [facility] -Subsequent Annual Wellness Visit [phone])  Complete Medication List: 1)  Aspirin 81 Mg Tbec (Aspirin) .... Take 1 tablet by mouth two times a day 2)  Detrol La 4 Mg Cp24 (Tolterodine tartrate) .... Take 1 capsule by mouth once a day 3)  Glucosamine Sulfate 500 Mg Tabs (Glucosamine sulfate) .... Take 1 tablet by mouth two times a day 4)  Nexium 40 Mg Cpdr (Esomeprazole magnesium) .Marland Kitchen.. 1 capsule by mouth once a day on m, w, f. 5)  Amlodipine Besylate 10 Mg Tabs (Amlodipine besylate) .Marland Kitchen.. 1 by mouth once daily 6)  Skelaxin 800 Mg Tabs (Metaxalone) .... Take 1 tablet by mouth three times a day 7)  Sm Calcium/vitamin D 500-200 Mg-unit Tabs (Calcium carbonate-vitamin d) .... Take 2 tablet by mouth once a day 8)  Synthroid 125 Mcg Tabs (Levothyroxine sodium) .Marland Kitchen.. 1 by mouth once daily 9)  Wellbutrin Xl 300 Mg Tb24 (Bupropion hcl) .... Take 1 tablet by mouth once a day 10)  Diovan 160 Mg Tabs (Valsartan) .Marland Kitchen.. 1 by mouth once daily 11)  Cymbalta 60 Mg Cpep (Duloxetine hcl) .... Take 1 tablet by mouth once a day 12)  Furosemide 40 Mg Tabs  (Furosemide) .Marland Kitchen.. 1 by mouth once daily 13)  Fish Oil Otc  .... Take 1 cap by mouth three times a day 14)  Lyrica 100 Mg Caps (Pregabalin) .... Take 1 tablet by mouth three times a day 15)  Neurontin 600 Mg Tabs (Gabapentin) .... Take 2 tablet by mouth three times a day 16)  Slow Fe 160 (50 Fe) Mg Cr-tabs (Ferrous sulfate dried) 17)  Wellbutrin Xl 150 Mg Xr24h-tab (Bupropion hcl) .... 1/2 at bedtime  Other Orders: Tdap => 44yrs IM ([address]315) Admin 1st Vaccine (17616) Admin 1st Vaccine [facility]) [phone]) Prescriptions: DIOVAN 160 MG TABS (VALSARTAN) 1 by mouth once daily  #90 x 3   Entered by:   Ami Bullins CMA   Authorized by:   Jacques Navy MD   Signed by:   Rose [name] CMA on 11/25/2009   Method used:   Electronically to        [facility] [address] [address] (retail)       [address]       [place], [place]  [address]       Ph: [phone]       Fax: [phone]   RxID:   [id number] FUROSEMIDE 40 MG TABS (FUROSEMIDE) 1 by mouth once daily  #90 x 3   Entered by:   Ami Bullins CMA   Authorized by:   Jacques Navy MD   Signed by:   Rose [name] CMA on 11/25/2009   Method used:   Electronically to        [facility] [address] (retail)       [address]       [place], [place]  [address]       Ph: [phone]       Fax: [phone]   RxID:   [id number] AMLODIPINE BESYLATE 10 MG TABS (AMLODIPINE BESYLATE) 1 by mouth once daily  #90 x 3   Entered by:   Ami Bullins CMA   Authorized by:   Jacques Navy MD  Signed by:   Rose [name] CMA on 11/25/2009   Method used:   Electronically to        [facility] (retail)       [address]       [place], [place]  [address]       Ph: [phone]       Fax: [phone]   RxID:   [id number] SYNTHROID 125 MCG TABS (LEVOTHYROXINE SODIUM) 1 by mouth once daily  #90 x 3   Entered by:   Ami Bullins CMA   Authorized  by:   Jacques Navy MD   Signed by:   Rose [name] CMA on 11/25/2009   Method used:   Electronically to        [facility] [address] (retail)       [address]       [place], [place]  [address]       Ph: [phone]       Fax: [phone]   RxID:   [id number] NEXIUM 40 MG CPDR (ESOMEPRAZOLE MAGNESIUM) 1 capsule by mouth once a day on M, W, F.  #90 x 3   Entered by:   Rose [name] CMA   Authorized by:   Jacques Navy MD   Signed by:   Rose [name] CMA on 11/25/2009   Method used:   Electronically to        [facility] [address] (retail)       [address]       [place], [place]  [address]       Ph: [phone]       Fax: [phone]   RxID:   [id number]    Pneumovax Immunization History:    Pneumovax # 1:  Pneumovax (11/29/2007)  Tetanus/Td Vaccine    Vaccine Type: Tdap    Site: left deltoid    Mfr: GlaxoSmithKline    Dose: 0.5 ml    Route: IM    Given by: Ami Bullins CMA    Exp. Date: 12/04/2011    Lot #: [id number]    VIS given: 01/31/08 version given November 25, 2009.   Appended Document: YEARLY FU /MEDICARE/ LABS NEXT DAY/DIDN'T WANT TO WAIT FOR A ... Patient: Rose [name] Note: All result statuses are Final unless otherwise noted.  Tests: (1) TSH (TSH)   FastTSH                   0.47 uIU/mL                 0.35-5.50  Tests: (2) BMP (METABOL)   Sodium                    141 mEq/L                   135-145   Potassium                 4.4 mEq/L                   3.5-5.1   Chloride                  103 mEq/L  96-112   Carbon Dioxide            32 mEq/L                    19-32   Glucose                   79 mg/dL                    88-41   BUN                       21 mg/dL                    6-60   Creatinine                0.9 mg/dL                   6.3-0.1   Calcium                   9.3 mg/dL                   6.0-10.9   GFR                        63.15 mL/min                >60  Tests: (3) CBC Platelet w/Diff (CBCD)   White Cell Count          7.3 K/uL                    4.5-10.5   Red Cell Count       [L]  3.69 Mil/uL                 3.87-5.11   Hemoglobin                12.4 g/dL                   32.3-55.7   Hematocrit           [L]  35.9 %                      36.0-46.0   MCV                       97.3 fl                     78.0-100.0   MCHC                      34.5 g/dL                   32.2-02.5   RDW                  [H]  14.9 %                      11.5-14.6   Platelet Count            183.0 K/uL                  150.0-400.0   Neutrophil %              57.4 %  43.0-77.0   Lymphocyte %              30.8 %                      12.0-46.0   Monocyte %                9.4 %                       3.0-12.0   Eosinophils%              2.1 %                       0.0-5.0   Basophils %               0.3 %                       0.0-3.0   Neutrophill Absolute      4.2 K/uL                    1.4-7.7   Lymphocyte Absolute       2.2 K/uL                    0.7-4.0   Monocyte Absolute         0.7 K/uL                    0.1-1.0  Eosinophils, Absolute                             0.2 K/uL                    0.0-0.7   Basophils Absolute        0.0 K/uL                    0.0-0.1  Tests: (4) Hepatic/Liver Function Panel (HEPATIC)   Total Bilirubin           0.7 mg/dL                   0.3-4.7   Direct Bilirubin          0.1 mg/dL                   4.2-5.9   Alkaline Phosphatase      64 U/L                      39-117   AST                       17 U/L                      0-37   ALT                       29 U/L                      0-35   Total Protein             6.3 g/dL                    5.6-3.8  Albumin                   3.9 g/dL                    8.1-1.9  Tests: (5) Sed Rate (ESR)   Sed Rate                  11 mm/hr                    0-22  Tests: (6) Lipid Panel (LIPID)   Cholesterol          [H]  205  mg/dL                   1-478     ATP III Classification            Desirable:  < 200 mg/dL                    Borderline High:  200 - 239 mg/dL               High:  > = 240 mg/dL   Triglycerides             125.0 mg/dL                 2.9-562.1     Normal:  <150 mg/dL     Borderline High:  308 - 199 mg/dL   HDL                       65.78 mg/dL                 >46.96   VLDL Cholesterol          25.0 mg/dL                  2.9-52.8  CHO/HDL Ratio:  CHD Risk                             4                    Men          Women     1/2 Average Risk     3.4          3.3     Average Risk          5.0          4.4     2X Average Risk          9.6          7.1     3X Average Risk          15.0          11.0                           Tests: (7) Cholesterol LDL - Direct (DIRLDL)  Cholesterol LDL - Direct                             124.5 mg/dL     Optimal:  <413 mg/dL     Near or Above Optimal:  100-129 mg/dL     Borderline High:  244-010 mg/dL     High:  160-189 mg/dL     Very High:  >161 mg/dL

## 2010-04-14 NOTE — Progress Notes (Signed)
  Phone Note Refill Request Message from:  Fax from Pharmacy on November 10, 2009 9:38 AM  Refills Requested: Medication #1:  SYNTHROID 125 MCG TABS 1 by mouth once daily Initial call taken by: Ami Bullins CMA,  November 10, 2009 9:38 AM    Prescriptions: SYNTHROID 125 MCG TABS (LEVOTHYROXINE SODIUM) 1 by mouth once daily  #30 x 3   Entered by:   Ami Bullins CMA   Authorized by:   Jacques Navy MD   Signed by:   [name] CMA on 11/10/2009   Method used:   Electronically to        [facility] [address] (retail)       [address]       [place], [place]  [address]       Ph: [phone]       Fax: [phone]   RxID:   [id number]

## 2010-04-14 NOTE — Progress Notes (Signed)
Summary: med refill  Phone Note Call from Patient   Summary of Call: Patient called stating that she needs 2 meds refilled. Per the patient she is going out of town and will be out of meds before she can come in for appt. Initial call taken by: Lucious Groves,  May 16, 2009 11:46 AM  Follow-up for Phone Call        Spoke with patient and prescription goes to [facility] on [address]. Patient also needs 90 day supply. Done. Follow-up by: Lucious Groves,  May 16, 2009 3:42 PM    Prescriptions: FUROSEMIDE 40 MG TABS (FUROSEMIDE) 1 by mouth once daily  #90 x 0   Entered by:   Lucious Groves   Authorized by:   Jacques Navy MD   Signed by:   Lucious Groves on 05/16/2009   Method used:   Electronically to        [facility] [address] [address] (retail)       [address]       [place], [place]  [address]       Ph: [phone]       Fax: [phone]   RxID:   [id number] AMLODIPINE BESYLATE 10 MG TABS (AMLODIPINE BESYLATE) 1 by mouth once daily  #90 x 0   Entered by:   Lucious Groves   Authorized by:   Jacques Navy MD   Signed by:   Lucious Groves on 05/16/2009   Method used:   Electronically to        [facility] [address] (retail)       [address]       [place], [place]  [address]       Ph: [phone]       Fax: [phone]   RxID:   [id number] FUROSEMIDE 40 MG TABS (FUROSEMIDE) 1 by mouth once daily  #30 x 12   Entered by:   Zella Ball Ewing   Authorized by:   Jacques Navy MD   Signed by:   Scharlene Gloss on 05/16/2009   Method used:   Electronically to        [facility] (retail)       [address]       [place], [place]  [address]       Ph: [phone]       Fax: [phone]   RxID:   [id number] AMLODIPINE BESYLATE 10 MG TABS (AMLODIPINE BESYLATE) 1 by mouth once daily  #30 x 12   Entered by:   Scharlene Gloss   Authorized by:   Jacques Navy MD   Signed by:   Scharlene Gloss on 05/16/2009   Method used:   Electronically to        [facility] (retail)       [address]       [place], [place]  [address]       Ph: [phone]       Fax: [phone]   RxID:   [id number]

## 2010-04-14 NOTE — Progress Notes (Signed)
  Phone Note Refill Request Message from:  Fax from Pharmacy on June 11, 2009 1:49 PM  Refills Requested: Medication #1:  SYNTHROID 125 MCG TABS 1 by mouth once daily Initial call taken by: Ami Bullins CMA,  June 11, 2009 1:49 PM    Prescriptions: SYNTHROID 125 MCG TABS (LEVOTHYROXINE SODIUM) 1 by mouth once daily  #30 x 3   Entered by:   Ami Bullins CMA   Authorized by:   Jacques Navy MD   Signed by:   [name] CMA on 06/11/2009   Method used:   Electronically to        [facility] (retail)       [address]       [place], [place]  [address]       Ph: [phone]       Fax: [phone]   RxID:   [id number]

## 2010-04-14 NOTE — Progress Notes (Signed)
  Phone Note Refill Request Message from:  Fax from Pharmacy on May 15, 2009 11:23 AM  Refills Requested: Medication #1:  DIOVAN 160 MG TABS 1 by mouth once daily Initial call taken by: Ami Bullins CMA,  May 15, 2009 11:23 AM    Prescriptions: DIOVAN 160 MG TABS (VALSARTAN) 1 by mouth once daily  #90 x 3   Entered by:   Ami Bullins CMA   Authorized by:   Jacques Navy MD   Signed by:   [name] CMA on 05/15/2009   Method used:   Electronically to        [facility] (retail)       [address]       [place], [place]  [address]       Ph: [phone]       Fax: [phone]   RxID:   [id number]

## 2010-04-14 NOTE — Letter (Signed)
Summary: [facility]  [facility]   Imported By: Lester [place] 08/14/2009 10:50:09  _____________________________________________________________________  External Attachment:    Type:   Image     Comment:   External Document

## 2010-04-14 NOTE — Letter (Signed)
Summary: [name]  [name]   Imported By: Sherian Rein 07/10/2009 09:18:10  _____________________________________________________________________  External Attachment:    Type:   Image     Comment:   External Document

## 2010-04-14 NOTE — Letter (Signed)
Summary: [name]  [name]   Imported By: Sherian Rein 09/23/2009 11:37:43  _____________________________________________________________________  External Attachment:    Type:   Image     Comment:   External Document

## 2010-04-14 NOTE — Letter (Signed)
Summary: [facility]  [facility]    Imported By: Sherian Rein 05/08/2009 09:31:55  _____________________________________________________________________  External Attachment:    Type:   Image     Comment:   External Document

## 2010-04-14 NOTE — Letter (Signed)
Summary: [facility]   Imported By: Lester [place] 11/18/2009 10:01:34  _____________________________________________________________________  External Attachment:    Type:   Image     Comment:   External Document

## 2010-04-15 ENCOUNTER — Encounter: Payer: Self-pay | Admitting: Obstetrics and Gynecology

## 2010-05-07 ENCOUNTER — Encounter: Payer: Self-pay | Admitting: Internal Medicine

## 2010-05-07 ENCOUNTER — Ambulatory Visit (INDEPENDENT_AMBULATORY_CARE_PROVIDER_SITE_OTHER): Payer: Medicare Other | Admitting: Internal Medicine

## 2010-05-07 DIAGNOSIS — I10 Essential (primary) hypertension: Secondary | ICD-10-CM

## 2010-05-07 DIAGNOSIS — H1030 Unspecified acute conjunctivitis, unspecified eye: Secondary | ICD-10-CM

## 2010-05-21 NOTE — Assessment & Plan Note (Signed)
Summary: FOLLOW UP /NWS  3   Vital Signs:  Patient profile:   70 year old female Height:      65 inches (165.10 cm) Weight:      124.50 pounds (56.59 kg) BMI:     20.79 O2 Sat:      94 % on Room air Temp:     98.7 degrees F (37.06 degrees C) oral Pulse rate:   67 / minute Resp:     16 per minute BP sitting:   120 / 58  (right arm) Cuff size:   regular  Vitals Entered By: Burnard Leigh CMA(AAMA) (May 07, 2010 11:47 AM)  O2 Flow:  Room air CC: Pt here for F/U.Pt as concerns about weight gain & possibly has infection in eyes/sls,cma Is Patient Diabetic? No Comments Pt states she is no longer taking Fish Oil and needs New Rx for Skelaxin   Primary Care Provider:  Elliotte Marsalis  CC:  Pt here for F/U.Pt as concerns about weight gain & possibly has infection in eyes/sls and cma.  History of Present Illness: Rose [name] presents for weight gain and bloating - chart reviewed she has gained 3 lbs since Sept. She is not exercising.  Her hip/leg pain is much  better since doing PT and exercise.   Conjunctivitis - red eyes with exudate and stickiness. No change in vision  Current Medications (verified): 1)  Aspirin 81 Mg Tbec (Aspirin) .... Take 1 Tablet By Mouth Two Times A Day 2)  Detrol La 4 Mg Cp24 (Tolterodine Tartrate) .... Take 1 Capsule By Mouth Once A Day 3)  Glucosamine Sulfate 500 Mg Tabs (Glucosamine Sulfate) .... Take 1 Tablet By Mouth Two Times A Day 4)  Nexium 40 Mg Cpdr (Esomeprazole Magnesium) .Marland Kitchen.. 1 Capsule By Mouth Once A Day On M, W, F. 5)  Amlodipine Besylate 10 Mg Tabs (Amlodipine Besylate) .Marland Kitchen.. 1 By Mouth Once Daily 6)  Skelaxin 800 Mg Tabs (Metaxalone) .... Take 1 Tablet By Mouth Three Times A Day 7)  Sm Calcium/vitamin D 500-200 Mg-Unit Tabs (Calcium Carbonate-Vitamin D) .... Take 2 Tablet By Mouth Once A Day 8)  Synthroid 125 Mcg Tabs (Levothyroxine Sodium) .Marland Kitchen.. 1 By Mouth Once Daily 9)  Wellbutrin Xl 300 Mg Tb24 (Bupropion Hcl) .... Take 1 Tablet By Mouth Once A  Day 10)  Diovan 160 Mg Tabs (Valsartan) .Marland Kitchen.. 1 By Mouth Once Daily 11)  Cymbalta 60 Mg Cpep (Duloxetine Hcl) .... Take 1 Tablet By Mouth Once A Day 12)  Furosemide 40 Mg Tabs (Furosemide) .Marland Kitchen.. 1 By Mouth Once Daily 13)  Fish Oil Otc .... Take 1 Cap By Mouth Three Times A Day 14)  Lyrica 100 Mg Caps (Pregabalin) .... Take 1 Tablet By Mouth Three Times A Day 15)  Neurontin 600 Mg Tabs (Gabapentin) .... Take 2 Tablet By Mouth Three Times A Day 16)  Slow Fe 160 (50 Fe) Mg Cr-Tabs (Ferrous Sulfate Dried) 17)  Wellbutrin Xl 150 Mg Xr24h-Tab (Bupropion Hcl) .... 1/2 At Bedtime  Allergies (verified): 1)  ! Vicodin 2)  ! * Dilaudid 3)  Amoxicillin (Amoxicillin) 4)  Morphine Sulfate (Morphine Sulfate)  Past History:  Past Medical History: Last updated: 11/25/2009 OTHER TENOSYNOVITIS OF HAND AND WRIST (ICD-727.05) PERIPHERAL NEUROPATHY (ICD-356.9) SUBCLAVIAN STEAL SYNDROME (ICD-435.2) CAROTID ARTERY DISEASE (ICD-433.10) ALCOHOL ABUSE, HX OF (ICD-V11.3) DEGENERATIVE JOINT DISEASE, LEFT HIP (ICD-715.95) FIBROMYALGIA (ICD-729.1) RENAL ARTERY STENOSIS (ICD-440.1) DEPRESSION, HX OF (ICD-V11.8) MORTON'S NEUROMA, LEFT (ICD-355.6) CAROTID BRUIT, LEFT (ICD-785.9) * SPINAL FUSION SURGERY DUODENAL ULCER, HX  OF (ICD-V12.71) Hx of BREAST CYST (ICD-610.0) CARCINOMA, BASAL CELL, FACE (ICD-173.3) ATTENTION DEFICIT DISORDER, ADULT (ICD-314.00) HYPOTHYROIDISM (ICD-244.9) * LEFT BRACHIAL ARTERY REPAIR OF PSEUDOANEURYSM * PERCUTANOUS TRANSLUMINAL ANGIOPLASTY OF RENAL ARTERIES HEART MURMUR, BENIGN (ICD-785.2) GERD (ICD-530.81) HYPERTENSION (ICD-401.9)   * LEFT BRACHIAL ARTERY REPAIR OF PSEUDOANEURYSM * PERCUTANOUS TRANSLUMINAL ANGIOPLASTY OF RENAL ARTERIES HEART MURMUR, BENIGN (ICD-785.2) GERD (ICD-530.81) HYPERTENSION (ICD-401.9)        Past Surgical History: Last updated: 11/25/2009 APPENDECTOMY, HX OF (ICD-V45.79) TONSILLECTOMY AND ADENOIDECTOMY, HX OF (ICD-V45.79) * LEFT BRACHIAL  ARTERY REPAIR OF PSEUDOANEURYSM * PERCUTANOUS TRANSLUMINAL ANGIOPLASTY OF RENAL ARTERIES Lumbar fusion July '06  T12-L5 (Dr. Noel Gerold) Cervical fusion '07 (Dr. Noel Gerold) Total hip replacement '09 Left Shoulder Arthroscopy July '10 ORIF left distal tibial fx '10  Family History: Last updated: 10/11/2007 father-adopted mother - adopted family history unkown  Social History: Last updated: 10/11/2007 HSG, [employer] - accounting married -'60-57yr divorced; '84- 2 years, divorced. 3 daughters - '61, '70, '71; 2 sons - '53, '55 (schizophrenic-died); 10 grandchildren retired Secretary/administrator, had own firm Lives alone with her two dogs.  Physical Exam  General:  Well-developed,well-nourished,in no acute distress; alert,appropriate and cooperative throughout examination Head:  Normocephalic and atraumatic without obvious abnormalities. No apparent alopecia or balding. Eyes:  mild conjunctivial erythema  Lungs:  normal respiratory effort and normal breath sounds.   Heart:  normal rate and regular rhythm.   Abdomen:  soft, non-tender, and normal bowel sounds.     Impression & Recommendations:  Problem # 1:  HYPERTENSION (ICD-401.9)  Her updated medication list for this problem includes:    Amlodipine Besylate 10 Mg Tabs (Amlodipine besylate) .Marland Kitchen... 1 by mouth once daily    Diovan 160 Mg Tabs (Valsartan) .Marland Kitchen... 1 by mouth once daily    Furosemide 40 Mg Tabs (Furosemide) .Marland Kitchen... 1 by mouth once daily  BP today: 120/58 Prior BP: 128/62 (11/25/2009)  Labs Reviewed: K+: 4.4 (11/21/2009) Creat: : 0.9 (11/21/2009)    Good control on present regimen.  Problem # 2:  CONJUNCTIVITIS, ACUTE, BILATERAL (ICD-372.00) Patient with acute conjunctivitis with no vision change or involvment of iris or pupil.  Plan - gentamicin drops.  Her updated medication list for this problem includes:    Gentamicin Sulfate 0.3 % Soln (Gentamicin sulfate) .Marland Kitchen... 2 qtts ou q 4 for two days then three times a day for 7  days of treatment for conjunctiviits  Complete Medication List: 1)  Aspirin 81 Mg Tbec (Aspirin) .... Take 1 tablet by mouth two times a day 2)  Detrol La 4 Mg Cp24 (Tolterodine tartrate) .... Take 1 capsule by mouth once a day 3)  Glucosamine Sulfate 500 Mg Tabs (Glucosamine sulfate) .... Take 1 tablet by mouth two times a day 4)  Nexium 40 Mg Cpdr (Esomeprazole magnesium) .Marland Kitchen.. 1 capsule by mouth once a day on m, w, f. 5)  Amlodipine Besylate 10 Mg Tabs (Amlodipine besylate) .Marland Kitchen.. 1 by mouth once daily 6)  Skelaxin 800 Mg Tabs (Metaxalone) .... Take 1 tablet by mouth three times a day 7)  Sm Calcium/vitamin D 500-200 Mg-unit Tabs (Calcium carbonate-vitamin d) .... Take 2 tablet by mouth once a day 8)  Synthroid 125 Mcg Tabs (Levothyroxine sodium) .Marland Kitchen.. 1 by mouth once daily 9)  Wellbutrin Xl 300 Mg Tb24 (Bupropion hcl) .... Take 1 tablet by mouth once a day 10)  Diovan 160 Mg Tabs (Valsartan) .Marland Kitchen.. 1 by mouth once daily 11)  Cymbalta 60 Mg Cpep (Duloxetine hcl) .... Take 1 tablet by  mouth once a day 12)  Furosemide 40 Mg Tabs (Furosemide) .Marland Kitchen.. 1 by mouth once daily 13)  Fish Oil Otc  .... Take 1 cap by mouth three times a day 14)  Lyrica 100 Mg Caps (Pregabalin) .... Take 1 tablet by mouth three times a day 15)  Neurontin 600 Mg Tabs (Gabapentin) .... Take 2 tablet by mouth three times a day 16)  Slow Fe 160 (50 Fe) Mg Cr-tabs (Ferrous sulfate dried) 17)  Wellbutrin Xl 150 Mg Xr24h-tab (Bupropion hcl) .... 1/2 at bedtime 18)  Gentamicin Sulfate 0.3 % Soln (Gentamicin sulfate) .... 2 qtts ou q 4 for two days then three times a day for 7 days of treatment for conjunctiviits Prescriptions: SKELAXIN 800 MG TABS (METAXALONE) Take 1 tablet by mouth three times a day  #90 x 12   Entered and Authorized by:   Jacques Navy MD   Signed by:   Jacques Navy MD on 05/07/2010   Method used:   Electronically to        [facility] (retail)       [address]       [place], [place]  [address]       Ph: [phone]       Fax: [phone]   RxID:   [id number] GENTAMICIN SULFATE 0.3 % SOLN (GENTAMICIN SULFATE) 2 qtts OU q 4 for two days then three times a day for 7 days of treatment for conjunctiviits  #10 ml x 0   Entered and Authorized by:   Jacques Navy MD   Signed by:   Jacques Navy MD on 05/07/2010   Method used:   Electronically to        [facility] [address] [address] (retail)       [address]       [place], [place]  [address]       Ph: [phone]       Fax: [phone]   RxID:   [id number]    Orders Added: 1)  Est. Patient Level III [88416]

## 2010-06-01 ENCOUNTER — Telehealth: Payer: Self-pay | Admitting: Internal Medicine

## 2010-06-01 LAB — BASIC METABOLIC PANEL
Calcium: 10.2 mg/dL (ref 8.4–10.5)
GFR calc Af Amer: >60 mL/min (ref >60)
GFR calc non Af Amer: 56 mL/min — ABNORMAL LOW (ref >60)
Glucose, Bld: 98 mg/dL (ref 70–99)
Sodium: 140 mEq/L (ref 135–145)

## 2010-06-03 LAB — POCT I-STAT, CHEM 8
Glucose, Bld: 102 mg/dL — ABNORMAL HIGH (ref 70–99)
HCT: 36 % (ref 36.0–46.0)
Hemoglobin: 12.2 g/dL (ref 12.0–15.0)
Potassium: 4.4 mEq/L (ref 3.5–5.1)

## 2010-06-11 NOTE — Progress Notes (Signed)
  Phone Note Refill Request Message from:  Fax from Pharmacy on June 01, 2010 5:45 PM  Refills Requested: Medication #1:  AMLODIPINE BESYLATE 10 MG TABS 1 by mouth once daily Initial call taken by: Ami Bullins CMA,  June 01, 2010 5:45 PM    Prescriptions: AMLODIPINE BESYLATE 10 MG TABS (AMLODIPINE BESYLATE) 1 by mouth once daily  #90 x 3   Entered by:   Ami Bullins CMA   Authorized by:   Jacques Navy MD   Signed by:   [name] CMA on 06/01/2010   Method used:   Electronically to        [facility] [address] [address] (retail)       [address]       [place], [place]  [address]       Ph: [phone]       Fax: [phone]   RxID:   [id number]

## 2010-06-13 ENCOUNTER — Other Ambulatory Visit: Payer: Self-pay | Admitting: Internal Medicine

## 2010-06-18 ENCOUNTER — Telehealth: Payer: Self-pay | Admitting: *Deleted

## 2010-06-18 NOTE — Telephone Encounter (Signed)
Reservations if she had elevated BP on ritalin but can give it a try. Needs to submit BP readings every week.

## 2010-06-18 NOTE — Telephone Encounter (Signed)
Left mess to call office back.   

## 2010-06-18 NOTE — Telephone Encounter (Signed)
Rose [name] wants to prescribe Adderall 5mg  bid. Pt will monitor her BP closely. Is MD ok with this med?

## 2010-06-21 LAB — CBC
HCT: 37.1 % (ref 36.0–46.0)
Platelets: 191 10*3/uL (ref 150–400)
WBC: 4.9 10*3/uL (ref 4.0–10.5)

## 2010-06-21 LAB — BASIC METABOLIC PANEL
BUN: 17 mg/dL (ref 6–23)
Calcium: 10.4 mg/dL (ref 8.4–10.5)
Creatinine, Ser: 0.94 mg/dL (ref 0.4–1.2)
GFR calc non Af Amer: 59 mL/min — ABNORMAL LOW (ref >60)
Potassium: 3.8 mEq/L (ref 3.5–5.1)

## 2010-06-22 NOTE — Telephone Encounter (Signed)
Patient informed, She Rose [name] will be prescribing vyvanse for pt, and says they will be faxing info to Rose [name]. Please review and sign that it is ok for them to prescribe the med.

## 2010-06-23 NOTE — Telephone Encounter (Signed)
It is medically safe to prescribe vyvanse for this patient.

## 2010-07-02 ENCOUNTER — Telehealth: Payer: Self-pay | Admitting: *Deleted

## 2010-07-02 ENCOUNTER — Other Ambulatory Visit: Payer: Self-pay | Admitting: Internal Medicine

## 2010-07-02 ENCOUNTER — Other Ambulatory Visit (INDEPENDENT_AMBULATORY_CARE_PROVIDER_SITE_OTHER): Payer: Medicare Other

## 2010-07-02 DIAGNOSIS — I7789 Other specified disorders of arteries and arterioles: Secondary | ICD-10-CM

## 2010-07-02 DIAGNOSIS — I6529 Occlusion and stenosis of unspecified carotid artery: Secondary | ICD-10-CM

## 2010-07-02 DIAGNOSIS — I701 Atherosclerosis of renal artery: Secondary | ICD-10-CM

## 2010-07-02 DIAGNOSIS — I10 Essential (primary) hypertension: Secondary | ICD-10-CM

## 2010-07-02 LAB — CBC WITH DIFFERENTIAL/PLATELET
Basophils Relative: 0.5 % (ref 0.0–3.0)
Eosinophils Relative: 2 % (ref 0.0–5.0)
HCT: 38.1 % (ref 36.0–46.0)
Hemoglobin: 13.1 g/dL (ref 12.0–15.0)
Lymphs Abs: 2 10*3/uL (ref 0.7–4.0)
Monocytes Relative: 9.9 % (ref 3.0–12.0)
Neutro Abs: 2.5 10*3/uL (ref 1.4–7.7)
RBC: 3.88 Mil/uL (ref 3.87–5.11)
WBC: 5.2 10*3/uL (ref 4.5–10.5)

## 2010-07-02 LAB — COMPREHENSIVE METABOLIC PANEL
CO2: 31 mEq/L (ref 19–32)
Calcium: 10.1 mg/dL (ref 8.4–10.5)
Chloride: 105 mEq/L (ref 96–112)
Creatinine, Ser: 0.9 mg/dL (ref 0.4–1.2)
GFR: 63.04 mL/min (ref >60.00)
Glucose, Bld: 84 mg/dL (ref 70–99)
Total Bilirubin: 0.5 mg/dL (ref 0.3–1.2)

## 2010-07-03 ENCOUNTER — Encounter: Payer: Self-pay | Admitting: Internal Medicine

## 2010-07-07 ENCOUNTER — Telehealth: Payer: Self-pay | Admitting: *Deleted

## 2010-07-07 ENCOUNTER — Other Ambulatory Visit: Payer: Self-pay

## 2010-07-07 NOTE — Telephone Encounter (Signed)
Pt needs a call back, has questions about results and checking her BP

## 2010-07-08 NOTE — Telephone Encounter (Signed)
OK to decrease wellbutrin. BP is good.   Side bar: has Rose [name] been contacted about an appointment for Rose [name]: went to [facility] Monday.

## 2010-07-08 NOTE — Telephone Encounter (Signed)
Pt is very worried about decreasing Wellbutrin. She feels ("knows") her anxiety will increase and she can not tolerate lower dose. She does not understand why Sharyl Nimrod is recommending decrease and wants to know if Dr Debby Bud feels that she should decrease due to BP?

## 2010-07-08 NOTE — Telephone Encounter (Signed)
Spoke w/pt, Bp has been "good" 120's/50's. Valinda Hoar, psych NP, wants pt to decrease Wellbutrin - says due to possibility of increasing her blood pressure.

## 2010-07-09 NOTE — Telephone Encounter (Signed)
I differ to Merideth in regard to psychotropic meds. She is supervised by Dr. Nolen Mu which adds to my confidence.

## 2010-07-13 ENCOUNTER — Telehealth: Payer: Self-pay | Admitting: *Deleted

## 2010-07-13 NOTE — Telephone Encounter (Signed)
Valinda Hoar, NP for Dr Emerson Monte called with update for Dr Debby Bud. She is referring pt to Dr Leonides Cave for neuropsychological testing. Some of Meredith's concerns are - increase in forgetfulness, angry/pushy, decreased focus, alcohol consumption after being advised to stop, admitting to driving after drinking 3 glasses of wine (has hx of alcohol abuse), scattered thinking. Pt has also been told that she needs to decrease Wellbutrin and did for short amount of time but increased med b/c she read on the Internet that it was ok to take increased dose with vyvanse against psych.    When Sharyl Nimrod was walking pt out of the office today pt became lightheaded and lost her balance. They are worried that pt may need OV with Dr Debby Bud for eval. Would you like the office to contact the pt for f/u?

## 2010-07-14 NOTE — Telephone Encounter (Signed)
Ok for ov

## 2010-07-15 ENCOUNTER — Ambulatory Visit (INDEPENDENT_AMBULATORY_CARE_PROVIDER_SITE_OTHER): Payer: Medicare Other | Admitting: Vascular Surgery

## 2010-07-15 ENCOUNTER — Other Ambulatory Visit (INDEPENDENT_AMBULATORY_CARE_PROVIDER_SITE_OTHER): Payer: Medicare Other

## 2010-07-15 DIAGNOSIS — Z48812 Encounter for surgical aftercare following surgery on the circulatory system: Secondary | ICD-10-CM

## 2010-07-15 DIAGNOSIS — I6529 Occlusion and stenosis of unspecified carotid artery: Secondary | ICD-10-CM

## 2010-07-15 NOTE — Telephone Encounter (Signed)
Left message on machine for pt to return my call  

## 2010-07-16 NOTE — Assessment & Plan Note (Signed)
OFFICE VISIT  NARYIAH, SCHLEY DOB:  1940/05/16                                       07/15/2010 HQION#:[id number]  I saw patient in the office today for continued follow-up of her carotid disease.  I last saw her in February 2011.  At that time, she had bilateral 40% to 59% carotid stenoses.  Since that time, she had been seen in our lab and had a follow-up duplex in September 2011 which again showed bilateral 40% to 59% carotid stenosis.  On her follow-up duplex today, her stenosis on the left had progressed, and she is now in the 60% to 79% range.  Since I saw her last, she has had no history of stroke, TIAs, expressive or receptive aphasia, or amaurosis fugax.  SOCIAL HISTORY:  She is single.  She has 5 children.  She quit tobacco approximately 20 years ago.  REVIEW OF SYSTEMS:  CARDIOVASCULAR:  She does states that she occasionally gets some chest pain, although this does not happen frequently.  She does not describe any specific chest pressure, and there is no associated shortness of breath.  She has had no history of DVT or phlebitis.  She has had no history of claudication, rest pain, or nonhealing ulcers. NEUROLOGIC:  She has occasional headache. PULMONARY:  She has had no productive cough bronchitis, asthma, or wheezing.  PHYSICAL EXAMINATION:  This is a pleasant 70 year old woman who appears her stated age.  Blood pressure 118/66, heart rate is 74, respiratory rate is 12.  Lungs are clear bilaterally to auscultation. Cardiovascular:  She has a right carotid bruit.  She has a regular rate and rhythm.  She has palpable femoral pulses and warm, well-perfused feet.  Abdomen:  Soft and nontender with normal-pitched bowel sounds. Neurologic:  She has no focal weakness or paresthesias.  I have independently interpreted her carotid duplex scan which shows evidence of a 40% to 59% right carotid stenosis with a 60% to 79% left carotid stenosis.   The velocities on the left have increased, compared to her previous study, but she still is in the mid range of the 60% to 79% range.  As she is asymptomatic, she understands we would not consider a left carotid endarterectomy unless the stenosis progressed to greater than 80% or she develops new neurologic symptoms.  I have ordered a follow-up carotid duplex scan in 6 months.  I will plan on seeing her back in 1 year unless there is any significant change in her duplex at her 13-month follow-up visit.  In the meantime, I have encouraged her to begin taking aspirin, as apparently she kind of stopped taking this on her own.    [name], M.D. Electronically Signed  CSD/MEDQ  D:  07/15/2010  T:  07/16/2010  Job:  [id number]  cc:   Rosalyn Gess. Norins, MD

## 2010-07-16 NOTE — Telephone Encounter (Signed)
Transferred to scheduler for OV

## 2010-07-17 ENCOUNTER — Encounter: Payer: Self-pay | Admitting: Internal Medicine

## 2010-07-20 ENCOUNTER — Ambulatory Visit (INDEPENDENT_AMBULATORY_CARE_PROVIDER_SITE_OTHER): Payer: Medicare Other | Admitting: Internal Medicine

## 2010-07-20 VITALS — BP 90/44 | HR 91 | Temp 98.2°F | Wt 126.0 lb

## 2010-07-20 DIAGNOSIS — F988 Other specified behavioral and emotional disorders with onset usually occurring in childhood and adolescence: Secondary | ICD-10-CM

## 2010-07-20 NOTE — Progress Notes (Signed)
Subjective:    Patient ID: Rose [name], female    DOB: [birth year], 70 y.o.   MRN: [id number]  HPI Patient presents to discuss the medical safety of increasing vyvanse to 40mg  daily from 20mg . She reports that she is still feeling very disorganized and that her ADD is not helping well. She is taking welbutrin xl 150mg  two in Am , 1 in PM. She is seeing Rosebud Poles, NP. See phone messaging. She is scheduled for neuropsychological testing with Dr. Leonides Cave. She brings with her a record of BP readings all of which are in acceptable range.   Past Medical History  Diagnosis Date  . Other tenosynovitis of hand and wrist   . Unspecified hereditary and idiopathic peripheral neuropathy   . Subclavian steal syndrome   . Occlusion and stenosis of carotid artery without mention of cerebral infarction   . Personal history of alcoholism   . Osteoarthrosis, unspecified whether generalized or localized, pelvic region and thigh   . Myalgia and myositis, unspecified   . Atherosclerosis of renal artery   . Personal history of other mental disorder   . Lesion of plantar nerve   . Other symptoms involving cardiovascular system   . History of spinal fusion   . Personal history of peptic ulcer disease   . Solitary cyst of breast   . Basal cell carcinoma of face   . Attention deficit disorder without mention of hyperactivity   . Unspecified hypothyroidism   . Undiagnosed cardiac murmurs   . Esophageal reflux   . Unspecified essential hypertension   . Esophageal reflux    Past Surgical History  Procedure Date  . Appendectomy   . Tonsillectomy and adenoidectomy   . Left brachial artery repair of pseudoaneurysm   . Percutanous transluminal angioplasty of renal arteris   . Lumbar fusion 09/2004    T12-L5 (Dr. Noel Gerold)  . Cervical fusion 2007    Dr. Noel Gerold  . Total hip arthroplasty   . Shoulder arthroscopy 09/2008    Left  . Orif tibia fracture 2010    Left distal   Family History  Problem  Relation Age of Onset  . Adopted: Yes  . Schizophrenia Son    History   Social History  . Marital Status: Single    Spouse Name: N/A    Number of Children: N/A  . Years of Education: N/A   Occupational History  . retired     IT trainer, had own firm   Social History Main Topics  . Smoking status: Not on file  . Smokeless tobacco: Not on file  . Alcohol Use:   . Drug Use:   . Sexually Active:    Other Topics Concern  . Not on file   Social History Narrative   HSG-[employer]-accountingMarried '60-67yr divorced; '84-2 years, divorced3 daughters- '61, '70, '71; 2 sons-'53,'55 (schizophrenic-died); 10 grandchildrenLives alone with her 2 dogs       Review of Systems Review of Systems  Constitutional:  Negative for fever, chills, activity change and unexpected weight change.  HENT:  Negative for hearing loss, ear pain, congestion, neck stiffness and postnasal drip.   Eyes: Negative for pain, discharge and visual disturbance.  Respiratory: Negative for chest tightness and wheezing.   Cardiovascular: Negative for chest pain and palpitations.       [No decreased exercise tolerance Gastrointestinal: [No change in bowel habit. No bloating or gas. No reflux or indigestion Genitourinary: Negative for urgency, frequency, flank pain and difficulty urinating.  Musculoskeletal: Negative  for myalgias, back pain, arthralgias and gait problem.  Neurological: Negative for dizziness, tremors, weakness and headaches.  Hematological: Negative for adenopathy.  Psychiatric/Behavioral: Negative for behavioral problems and dysphoric mood.        Objective:   Physical Exam WNWD white woman HEENT - nl Cor RRR Psych- stable.        Assessment & Plan:  1. ADD - there is no medical reason, i.e. Elevated blood pressure, to prevent an increase in vyvanse.   Plan - will copy Merideth Baker on the increase.

## 2010-07-23 NOTE — Procedures (Unsigned)
CAROTID DUPLEX EXAM  INDICATION:  Carotid stenosis.  HISTORY: Diabetes:  No. Cardiac:  No. Hypertension:  Yes. Smoking:  Previous. Previous Surgery:  History of left brachial artery pseudoaneurysm repair. CV History:  Currently experiencing dizziness. Amaurosis Fugax No, Paresthesias No, Hemiparesis No.                                      RIGHT             LEFT Brachial systolic pressure:         124 Brachial Doppler waveforms:         Normal            Abnormal Vertebral direction of flow:        Antegrade         Retrograde DUPLEX VELOCITIES (cm/sec) CCA peak systolic                   62                102 ECA peak systolic                   285               123 ICA peak systolic                   136               195 ICA end diastolic                   39                67 PLAQUE MORPHOLOGY:                  Mixed             Mixed PLAQUE AMOUNT:                      Moderate          Moderate PLAQUE LOCATION:                    ECA, proximal ICA ECA, CCA, ICA  IMPRESSION: 1. Right internal carotid artery velocities suggest 40% to 59%     stenosis. 2. Left internal carotid artery velocities suggest 60% to 79%     stenosis. 3. Increased velocities as compared to the previous examination. 4. Right external carotid artery stenosis noted.  ___________________________________________ [name], M.D.  EM/MEDQ  D:  07/15/2010  T:  07/15/2010  Job:  [id number]

## 2010-07-28 ENCOUNTER — Telehealth: Payer: Self-pay

## 2010-07-28 NOTE — Assessment & Plan Note (Signed)
OFFICE VISIT   Rose [name]  DOB:  1940/07/28                                       04/24/2009  ZOXWR#:[id number]   I saw the patient in the office today for continued followup of her  carotid disease.  I had last seen her back in September 2010 with  bilateral 40% to 59% carotid stenoses.  Since I saw her last, she has  had no history of stroke, TIAs, expressive or receptive aphasia, or  amaurosis fugax.  She has had a long history of some paresthesias in her  left thumb and left index finger which she has had for years.  These  symptoms are aggravated by a sewing and seem to be related to certain  positions.  Of note, I had repaired brachial artery pseudoaneurysm in  her left arm back in May 2001.   PAST MEDICAL HISTORY:  Significant for fibromuscular dysplasia of the  renal arteries and a history of hypertension.  Her hypertension has been  well controlled on her current medications.  She also has a history of  peripheral neuropathy which she has had since 2009 and this has also  been stable.   SOCIAL HISTORY:  She is single.  She has 5 children.  She quit tobacco  15 years ago.   REVIEW OF SYSTEMS:  CARDIOVASCULAR:  She has had no recent chest pain,  chest pressure, palpitations, or arrhythmias.  She has had no  claudication, rest pain, or nonhealing ulcers.  She had no history of  DVT or phlebitis.  NEUROLOGIC:  She has had no dizziness, blackouts, headaches, or  seizures.  PULMONARY:  She had no productive cough, bronchitis, asthma, or  wheezing.   PHYSICAL EXAMINATION:  This is a pleasant, 70 year old woman who appears  her stated age.  Blood pressure is 118/67 in the right arm, 77/52 in the  left arm.  Saturation 98%.  Heart rate is 69.  HEENT is unremarkable.  Lungs are clear bilaterally to auscultation without rales, rhonchi, or  wheezing.  On cardiovascular exam, she has a right carotid bruit.  She  has a regular rate and rhythm  without murmur appreciated.  She has  palpable femoral pulses and warm, well-perfused feet.  She has a  palpable right radial pulse.  I cannot palpate a left radial pulse.  Neurologic exam:  She has no focal weakness or paresthesias.  Her  abdomen is soft and nontender with normal-pitched bowel sounds.   I did independently interpret her carotid duplex scan which shows  bilateral 40% to 59% carotid stenosis.  These velocities are stable over  the last 6 months.  She does have retrograde flow in the left vertebral  artery consistent with left subclavian steal.   I have explained we generally do not consider carotid endarterectomy  unless she developed new neurologic symptoms or the stenosis progressed  to greater than 80%.  I have ordered a followup carotid duplex scan in 6  months and plan on seeing her back at that time.  If her velocities  remains stable, we might be able to potentially consider stretching her  followup out to 1 year.   With respect to her left subclavian stenosis, this is asymptomatic.  I  do not think it explains the symptoms she is having in her thumb and  index finger on  the left hand.  If she had problems with dizziness, we  could consider evaluating this.  However, currently, she has had no  problems with dizziness.  She does know to continue taking her aspirin.  Finally, with respect to her hypertension, this has been well  controlled.     Rose [name]. Rose [name], M.D.  Electronically Signed   CSD/MEDQ  D:  04/24/2009  T:  04/25/2009  Job:  [id number]   cc:   Rose Gess. Norins, MD

## 2010-07-28 NOTE — Procedures (Signed)
CAROTID DUPLEX EXAM   INDICATION:  Carotid disease, left subclavian steal.   HISTORY:  Diabetes:  No.  Cardiac:  No.  Hypertension:  Yes.  Smoking:  Previous.  Previous Surgery:  History of left brachial artery pseudoaneurysm  repair.  CV History:  Currently asymptomatic.  Amaurosis Fugax No, Paresthesias No, Hemiparesis No                                       RIGHT             LEFT  Brachial systolic pressure:         124  Brachial Doppler waveforms:         Normal            Abnormal  Vertebral direction of flow:        Antegrade         Retrograde  DUPLEX VELOCITIES (cm/sec)  CCA peak systolic                   52                95  ECA peak systolic                   294               146  ICA peak systolic                   108               124  ICA end diastolic                   31                41  PLAQUE MORPHOLOGY:                  Mixed             Mixed  PLAQUE AMOUNT:                      Mild / moderate   Mild / moderate  PLAQUE LOCATION:                    ECA / proximal ICA                  ECA / CCA / proximal ICA   IMPRESSION:  1. No hemodynamically significant stenosis of the right internal      carotid artery is noted with mild plaque formations as described      above.  2. Doppler velocities suggest a 40%-59% stenosis of the left proximal      internal carotid artery.  3. Mild vessel tortuosity is noted in the bilateral distal internal      carotid arteries.  4. The right external carotid artery demonstrates stenosis.  5. The left brachial pressure was not obtained due to patient refusal.  6. No significant change noted when compared to the previous exam on      04/24/2009.   ___________________________________________  [name], M.D.   CH/MEDQ  D:  11/21/2009  T:  11/22/2009  Job:  [id number]

## 2010-07-28 NOTE — Op Note (Signed)
NAMEJAMAIYAH, Rose [name]              ACCOUNT NO.:  [id number]   MEDICAL RECORD NO.:  [id number]          PATIENT TYPE:  AMB   LOCATION:  SDS                          FACILITY:  [facility]   PHYSICIAN:  Eulas Post, MD    DATE OF BIRTH:  Feb 16, 1941   DATE OF PROCEDURE:  10/01/2008  DATE OF DISCHARGE:  10/01/2008                               OPERATIVE REPORT   PREOPERATIVE DIAGNOSIS:  Left shoulder impingement syndrome and adhesive  capsulitis.   POSTOPERATIVE DIAGNOSIS:  Left shoulder impingement syndrome and  adhesive capsulitis.   OPERATIVE PROCEDURE:  Left shoulder arthroscopy with acromioplasty and  manipulation under anesthesia.   SURGEON:  Eulas Post, MD   PREOPERATIVE INDICATIONS:  [name] is a 70 year old woman  who complained of left shoulder stiffness and moderate-to-severe pain  for many months, if not progressing over the course of years.  She had  difficulty with overhead activity.  She failed injections as well as  physical therapy.  She elected to undergo the above-named procedures.  The risks, benefits, and alternatives were discussed with her  preoperatively including but not limited to risks of infection,  bleeding, nerve injury, recurrent shoulder stiffness, recurrent shoulder  pain, limited function, cardiopulmonary complications, stroke, among  others and she was willing to proceed.   OPERATIVE FINDINGS:  The articular cartilage of the joint was relatively  well maintained.  The capsule had the injected appearance of adhesive  capsulitis.  The biceps tendon was intact as was the superior labrum.  There was mild fraying of the labrum both anteriorly and posteriorly.  There was intact rotator cuff as viewed from the articular and the  bursal side.  The subacromial space had moderate subacromial spurring.  Exam under anesthesia demonstrated forward flexion to 130 degrees.  Manipulation did yield audible and palpable lysis of adhesions.  External rotation was to 80 degrees with the arm at the side after the  manipulation, and external rotation was to 90 degrees with the arm in 90  degrees of abduction and internal rotation to 80 degrees with the arm at  90 degrees of abduction.  Forward flexion was to 180 degrees after the  manipulation.   OPERATIVE PROCEDURE:  The patient was brought to the operating room and  placed in supine position.  IV antibiotics were given, given her history  of a hip arthroplasty.  The left upper extremity was prepped and draped  in the usual sterile fashion after manipulation under anesthesia was  carried out with the above-named findings.  Diagnostic arthroscopy was  carried out with the above-named findings.  The arthroscopic shaver was  used to debride the labrum slightly.  The camera was then placed into  the subacromial space.  Complete bursectomy was carried out.  Acromioplasty was performed with the bur.  The CA ligament was released  with the ArthroCare.  We  then confirmed from multiple views that the rotator cuff was indeed  intact and then irrigated the shoulder copiously and closed the portals  with Monocryl followed by Steri-Strips and sterile gauze.  She tolerated  the procedure well  and there were no complications.      Eulas Post, MD  Electronically Signed     JPL/MEDQ  D:  10/01/2008  T:  10/02/2008  Job:  [id number]

## 2010-07-28 NOTE — Telephone Encounter (Signed)
Patient called Rose [name] stating that she reviewed the past medical history section of her check out sheet and it is not accurate. She is requesting a call back to discuss missing information.

## 2010-07-28 NOTE — Telephone Encounter (Signed)
Spoke w/pt and explained some of her confusion regarding the past medical history...Marland KitchenMarland KitchenMarland Kitchen I changed some of the history to reflect a more clear wording to help the patient (EX: Osteoarthrosis, unspecified whether generalized or localized, pelvic region and thigh - I changed this to how it was worded in centricity - DJD of left hip. I did not change any ICD-9 codes) She would like a couple things changed, See below.   1. Reflux is on past med history twice - I removed one BUT she wants all removed stating she has never had reflux or heartburn. So, Why is she on nexium? 2. She feels that the Renal artery stenosis should at least have FMD beside it.... Please update/add if you feel appropriate.

## 2010-07-28 NOTE — Telephone Encounter (Signed)
GERD - she is taking powerful medication for acid, either dyspepsia or gerd. Fibromuscular dysplasia and RAS are not necessarily the same. Her diagnosis is RAS, renal artery stenosis, whether it is due to FMD or other. I will leave the RAS diagnosis as more specific and descriptive.

## 2010-07-28 NOTE — Consult Note (Signed)
NEW PATIENT CONSULTATION   [name], [name]  DOB:  [birth year]                                       12/12/2008  ZOXWR#:[id number]   I saw Rose [name] in the office today in consultation concerning some mild  carotid disease.  She was referred by Dr. Debby Bud.  This is a pleasant 70-  year-old woman who I actually saw back in 2001 with a brachial artery  pseudoaneurysm of the left arm which was repaired.  The patient had  undergone a carotid duplex scan at [facility] in May  2010 which demonstrated by velocity criteria a 40-59% stenosis  bilaterally.  She was sent for vascular evaluation.  Of note her duplex  scan also at that time showed reversible flow in the left vertebral  artery.   The patient is right-handed.  She denies any history of stroke, TIAs,  expressive or receptive aphasia, or amaurosis fugax.  She has had no  significant problems with dizziness.   PAST MEDICAL HISTORY:  Is significant for fibromuscular dysplasia of the  renal arteries.  Currently her blood pressure is under good control. She  has undergone previous renal angioplasty.  She also has a history of  peripheral neuropathy which she developed in 2009.  She denies any  history of diabetes, hypercholesterolemia, history of previous  myocardial infarction or history of congestive heart failure.   PAST SURGICAL HISTORY:  Significant for a left hip replacement in August  2009.  She has also had spinal fusion in the past and cervical fusions.   SOCIAL HISTORY:  Single.  She has five children.  She is a retired IT trainer.  She quit tobacco 15 years ago.   REVIEW OF SYSTEMS:   MEDICATIONS:  Are documented on the medical history form in her chart.   PHYSICAL EXAMINATION:  GENERAL:  This is a pleasant 70 year old woman  who appears her stated age.  VITAL SIGNS:  Blood pressure is 159/75, heart rate is 101. She would  only allow [place] to take blood pressure in the right arm.  HEENT:   Unremarkable.  NECK:  Supple.  No cervical lymphadenopathy.  She has a right carotid  bruit.  LUNGS:  Clear bilaterally to auscultation.  HEART:  She has a regular rate and rhythm.  ABDOMEN:  Soft and nontender.  I cannot palpate an aneurysm.  She has  palpable femoral pulses and palpable popliteal and posterior tibial  pulses bilaterally.  NEUROLOGIC:  Exam is nonfocal with good strength in the upper  extremities and lower extremities bilaterally.   I have reviewed her Doppler study from May and by velocity criteria I  would call these bilateral 40-59% stenoses.   I have explained that we would generally not consider carotid  endarterectomy in an asymptomatic patient unless the stenosis progressed  to greater than 80%.  I have recommend a follow-up duplex scan at the  end of November which would be 6 months from her last study.  We have  briefly discussed the surgery and the potential risks associated with  this and she understands that at this point there would be no indication  for carotid endarterectomy.  I plan on seeing her back in 6 months.  She  knows to call sooner if she has problems.  In the meantime she knows to  continue taking her aspirin.  Rose [name]. Edilia Bo, M.D.  Electronically Signed   CSD/MEDQ  D:  12/12/2008  T:  12/13/2008  Job:  [id number]   cc:   Rose Gess. Norins, MD

## 2010-07-28 NOTE — Procedures (Signed)
CAROTID DUPLEX EXAM   INDICATION:  Followup carotid artery disease.   HISTORY:  Diabetes:  No.  Cardiac:  No.  Hypertension:  Yes.  Smoking:  Previous 20 years ago.  Previous Surgery:  CV History:  No.  Amaurosis Fugax No, Paresthesias No, Hemiparesis No                                       RIGHT             LEFT  Brachial systolic pressure:         120               68  Brachial Doppler waveforms:         WNL               WNL  Vertebral direction of flow:        Antegrade         Retrograde  DUPLEX VELOCITIES (cm/sec)  CCA peak systolic                   55                95  ECA peak systolic                   231               107  ICA peak systolic                   109               178  ICA end diastolic                   42                58  PLAQUE MORPHOLOGY:                  Heterogeneous     Heterogeneous  PLAQUE AMOUNT:                      Mild              Mild  PLAQUE LOCATION:                    ICA, ECA          ICA   IMPRESSION:  1. Right internal carotid artery suggests 40%-59% stenosis.  2. Left internal carotid artery suggests 40%-59% stenosis.  3. Right vertebral appears antegrade.  Left vertebral appears      retrograde.  4. Right external carotid artery stenosis.   ___________________________________________  [name], M.D.   CB/MEDQ  D:  04/24/2009  T:  04/24/2009  Job:  [id number]

## 2010-07-31 NOTE — Op Note (Signed)
Rose [name], PERREN              ACCOUNT NO.:  [id number]   MEDICAL RECORD NO.:  [id number]          PATIENT TYPE:  INP   LOCATION:  2899                         FACILITY:  [facility]   PHYSICIAN:  Sharolyn Douglas, M.D.        DATE OF BIRTH:  June 08, 1940   DATE OF PROCEDURE:  12/25/2003  DATE OF DISCHARGE:                                 OPERATIVE REPORT   PREOPERATIVE DIAGNOSIS:  Degenerative lumbar scoliosis and spinal stenosis.   POSTOPERATIVE DIAGNOSIS:  Degenerative lumbar scoliosis and spinal stenosis.   OPERATION PERFORMED:  1.  L1 through L5 posterior spinal arthrodesis.  2.  Segmental pedicle screw instrumentation, L1 through L5 using the Spinal      Concepts system.  3.  L4-5 lumbar laminectomy with wide decompression of the thecal sac and      nerve roots bilaterally.  4.  Transforaminal lumbar interbody fusion, L4-5 with placement of 8 mm PEAK      prosthetic cage.  5.  Left posterior iliac crest bone graft and 30 mL Grafton bone graft      substitute.  6.  Neural monitoring with free running and triggered EMGs.   SURGEON:  Sharolyn Douglas, M.D.   ASSISTANT:  Verlin Fester, P.A.   ANESTHESIA:  General endotracheal.   COMPLICATIONS:  None.   INDICATIONS FOR PROCEDURE:  The patient is a pleasant 70 year old female  with progressive back and bilateral lower extremity pain secondary to  degenerative lumbar scoliosis and spinal stenosis.  She has been refractory  to conservative treatment and has elected to undergo decompression and  fusion of her scoliosis in hopes of improving her symptoms and preventing  further collapse.  Risks, benefit and alternatives were reviewed.   DESCRIPTION OF PROCEDURE:  The patient was properly identified in the  holding area and taken to the operating room.  She underwent general  endotracheal anesthesia without difficulty.  She was given prophylactic IV  antibiotics.  She was carefully position on the Wilson frame.  All bony  prominences were  padded.  Face and eyes were protected at all times.  Back  prepped and draped in the usual sterile fashion.  An incision was made over  the left PSIS.  Dissection was carried down to the deep fascia.  The iliac  crest was exposed.  The PSIS was removed using a Leksell rongeur and copious  amounts of bone graft removed from between the iliac tables.  The wound was  irrigated. We closed the deep fascia with #1 Vicryl followed by 2-0 Vicryl  on the subcutaneous layer and then a 3-0 Vicryl suture on the skin edges.  That completed the posterior iliac crest bone graft harvest.   We then turned our attention to the midline.  Incision made from T12 down to  S1 in the midline.  Dissection was carried sharply through the deep fascia.  The paraspinal muscles were elevated out over the tips of the transverse  processes of L1 through L5.  We noted that there was significant hypertrophy  of the facet joints as well as scoliosis and rotational malalignment of  the  spine.  Once the bones had been fully cleaned of all soft tissue, deep  retractors were placed.  We then completed a wide laminectomy at L4-5 by  removing the entire lamina of L4 as well as the spinous process.  We then  completed a facetectomy on the right side where the scoliosis had collapsed  creating severe foraminal and lateral recess stenosis.  We found the  ligamentum to be hypertrophied.  The spinal stenosis at L4-5 was completely  decompressed.  We identified the L4 and L5 nerve roots and decompressed out  the foramen.  We then turned our attention to completing a transforaminal  lumbar interbody fusion on L4-5 on the right side.  The disk space was  identified.  The space was collapsed and we used dilators to sequentially  increase the disk space height up to 8 mm.  We used curettes to scrape the  cartilaginous end plates and remove all disk material across the  contralateral side.  We then packed the disk space with the posterior  iliac  crest bone graft.  We then inserted an 8 mm PEAK cage which had been packed  with iliac crest bone graft into the interspace in an oblique fashion.  We  monitored free running EMGs throughout the decompression as well as the TLIF  procedure and there were no deleterious changes.  We then turned our  attention to placing pedicle screws, L1 through L5 bilaterally.  We used an  anatomic probing technique.  We placed 5.5 x 40 mm screws at L1, 5.5 x 40 mm  screws at L2, 6.5 x 40 mm screws at L3, 6.5 x 45 mm screws at L4 and 7.5 x  40 mm screw on the left at L5 and a 7.5 x 45 mm screw on the right at L5.  Each pedicle hole was palpated with a ball tip feeler and there were no  breeches.  We stimulated each pedicle screw with triggered EMGs and there  were no deleterious changes.  We had good screw purchase and the patient had  adequate bone stock.  We then turned our attention to completing a posterior  spinal arthrodesis by decorticating the transverse processes of L1 through  L5 bilaterally.  The pars interarticularis and facet joints were also  decorticated.  We then packed the remaining posterior iliac crest bone graft  into the lateral gutters and this was supplemented with local bone graft  obtained from the laminectomy and spinous processes.  We also utilized 30 mL  of Grafton bone graft substitute.  We then placed the titanium rods into the  polyaxial screw heads and completed a rod rotation maneuver to partially  reduce the deformity.  The rods were bent into normal sagittal plane  lordosis.  We applied compression along the convexity of the curve and  distraction within the concavity.  The locking caps were sheared off.  Intraoperative x-ray showed appropriate positioning of the instrumentation.  The wound was irrigated.  We placed a deep Hemovac drain.  Deep fascia  closed with a running #1 Vicryl suture, subcutaneous layer closed with 2-0 Vicryl suture followed by a running 3-0  subcuticular suture on the skin  edges.  Benzoin and Steri-Strips placed.  Sterile dressing applied. The  patient was extubated without difficulty and transported to the recovery  room in stable condition, able to move the upper and lower extremities.       MC/MEDQ  D:  12/25/2003  T:  12/25/2003  Job:  [id number]

## 2010-07-31 NOTE — Consult Note (Signed)
[place]  Patient:    Rose [name], Rose [name] Visit Number: [id number] MRN: [id number]          Service Type: OBV Location: [id number] Attending Physician:  Tresa Garter Dictated by:   Ollen Gross, M.D. Proc. Date: 05/01/01 Admit Date:  05/01/2001   CC:         Sonda Primes, M.D.                          Consultation Report  REASON FOR CONSULTATION:  Back and right leg pain.  HISTORY OF PRESENT ILLNESS:  Rose [name] is a 70 year old female with a 2-week history of right of the low back pain. Began when she was sailing in the [place]. She saw a chiropractor there without relief. She returned to [facility] and saw her chiropractor here, again with no relief. She had an colonoscopy 3 days ago with a marked increase in low back and right leg pain since. The leg is not hurting when she is resting. She will only get leg pain when she is ambulating. It mainly goes down to her foot and is hurting in her toes. She is not complaining of any thigh pain at all. Pain is mainly medial in her leg and in her medial foot. She gets numbness in her toes with ambulation. At rest, the majority of her pain is in the right lower lumbar area. She is not having any groin pain at all. She feels as though her leg is somewhat weak. She is not having any left lower extremity symptoms. She is not having any bowel or bladder symptoms.  PHYSICAL EXAMINATION:  GENERAL:  Adult conversive female in no apparent distress. She has no tenderness about her pelvis. She has full range of motion of both hips without pain. Motor is 5/5 left lower extremity and right lower extremity is also 5/5 with the exception of the right EHL and right tibialis anterior both of which are only 4/5. She has slightly diminished sensation right L4, otherwise sensation intact. She has a positive straight leg raise in the right lower extremity. She has a lot of soft tissue tenderness right  lower lumbar paraspinal area, but no bony tenderness. Could not assess lumbar range of motion as she was lying supine in bed. She did not have any radiographs taken yet.  ASSESSMENT AND PLAN:  Back and right lower extremity pain. Exam and history suggests a lumbar herniated disk with impingement in the right L4 and possibly L5 nerve roots. The only concern is the weakness in dorsiflexion in upper foot and great toe. At this time I recommend lumbar plane films and lumbar MRI to rule out a herniated disk. If indeed it is a disk herniation she may need a nerve root injections or possible surgical treatment, but that would not be the first option. She has indicated that she potentially may want neurosurgical evaluation if it is a herniated disk, and that would be her decision. She is to be admitted to Dr. Adah Perl service and she will obtain a lumber MRI. Will follow up on that. Dictated by:   Ollen Gross, M.D. Attending Physician:  Tresa Garter DD:  05/01/01 TD:  05/01/01 Job: [id number]

## 2010-07-31 NOTE — H&P (Signed)
Rose [name], Rose [name]              ACCOUNT NO.:  [id number]   MEDICAL RECORD NO.:  [id number]           PATIENT TYPE:   LOCATION:                               FACILITY:  [facility]   PHYSICIAN:  Sharolyn Douglas, M.D.        DATE OF BIRTH:  1940-09-25   DATE OF ADMISSION:  07/14/2005  DATE OF DISCHARGE:                                HISTORY & PHYSICAL   CHIEF COMPLAINT:  Neck pain.   HISTORY OF PRESENT ILLNESS:  The patient is a 70 year old female who has had  neck pain for quite some time now.  She has failed conservative treatments.  Pain has gotten to the point that it is interfering with her activities of  daily living and quality of life.  It is felt her best course of management  secondary to her MRI/x-ray findings as well as her physical exam findings  would be an ACDF from C4 to C7.  Risks and benefits of this procedure were  discussed with the patient by Dr. Noel Gerold; she indicated understanding and  opted to proceed.   ALLERGIES:  AMOXICILLIN causes a rash.   MEDICATIONS:  Aspirin, Detrol, Nexium, Diovan, Synthroid, methocarbamol,  Lexapro and trazodone.   PAST MEDICAL HISTORY:  1.  Hypothyroidism.  2.  Hypertension.  3.  Urinary incontinence.  4.  Degenerative disk disease.   PAST SURGICAL HISTORY:  1.  Lumbar fusion in October of 2005.  2.  Renal angioplasty.  3.  Brachial artery repair.   SOCIAL HISTORY:  The patient denies tobacco use.  She drinks approximately 2-  3 drinks of wine or beer per day.  She is retired.  Her daughter will help  her with her postoperative course.   FAMILY HISTORY:  Noncontributory and unknown secondary to the patient being  adopted.   REVIEW OF SYSTEMS:  The patient denies fevers, chills, sweats or bleeding  tendencies.  CNS:  Denies blurred vision, double vision, seizures, headaches  or paralysis.  CARDIOVASCULAR:  Denies chest pain, angina, orthopnea,  claudication or palpitations.  PULMONARY:  Denies shortness of breath,  productive  cough or hemoptysis.  GI:  Denies nausea, vomiting, constipation,  diarrhea, melena or bloody stools.  GU:  Denies dysuria, hematuria or  discharge.  MUSCULOSKELETAL:  As per HPI.   PHYSICAL EXAMINATION:  VITAL SIGNS:  Pulse is 76, respirations 16, blood  pressure 100/64.  GENERAL APPEARANCE:  The patient is a 70 year old white female who is alert  and oriented in no acute distress, well-nourished and well-groomed, appears  stated age, pleasant and cooperative.  HEENT:  Head is normocephalic, atraumatic.  Pupils are equal, round and  reactive to light  Extraocular movements intact.  Nares patent.  Pharynx is  clear.  NECK:  Soft to palpation, no lymphadenopathy or thyromegaly noted, no bruits  appreciated.  CHEST:  Clear to auscultation bilaterally.  No rales, rhonchi, stridor,  wheezes or friction rubs.  BREASTS:  Not pertinent and not performed.  HEART:  S1 and S2.  Regular rate and rhythm.  No murmurs, gallops or rubs  noted.  ABDOMEN:  Soft to palpation, nontender and non-distended.  No organomegaly  noted.  GU:  Not pertinent and not performed.  EXTREMITIES:  As per HPI.  SKIN:  Intact without any lesions or rashes.   RADIOLOGIC FINDINGS:  X-rays show cervical degenerative disk disease.   IMPRESSION:  1.  Cervical spondylitic radiculopathy.  2.  Hypertension.  3.  Urinary incontinence.  4.  Degenerative disk disease.   PLAN:  1.  Admit to [facility] on June 14, 2005 for an ACDF from C4 to C7;      this will be done by Dr. Sharolyn Douglas.  2.  We are awaiting medical clearance from her primary care doctor.  3.  The patient refuses a Foley catheter for intraoperative or postoperative      course.  4.  The patient does not have a good response to morphine and would prefer      to Dilaudid should she need any IV medications for pain.      Verlin Fester, P.A.      Sharolyn Douglas, M.D.  Electronically Signed    CM/MEDQ  D:  07/06/2005  T:  07/06/2005  Job:   [id number]

## 2010-07-31 NOTE — Op Note (Signed)
[facility]  Patient:    Rose [name], Rose [name]                     MRN: [id number] Proc. Date: 07/19/99 Adm. Date:  [id number] Disc. Date: [id number] Attending:  Bennye Alm CC:         Evette Georges, M.D. [facility]                           Operative Report  PREOPERATIVE DIAGNOSIS:  Pseudoaneurysm of the left brachial artery.  POSTOPERATIVE DIAGNOSIS:  Pseudoaneurysm of the left brachial artery.  PROCEDURE:  Repair of left brachial artery pseudoaneurysm.  SURGEON:  [name], M.D.  ASSISTANT:  Nurse.  ANESTHESIA:  Local with sedation.  DESCRIPTION OF PROCEDURE:  The patient was taken to the operating room and sedated by anesthesia.  The left upper extremity was prepped and draped in the usual sterile fashion.  After the skin was infiltrated with 1% lidocaine, a longitudinal incision was made over the pulsatile mass and the brachial artery was dissected free above the mass and also below.  Once these were controlled, the patient was heparinized and the vessels were then clamped.  The aneurysm was then opened and the hole in the artery was then identified.  This was irrigated with heparinized saline.  There was excellent inflow and good back bleeding.  It was then closed with four interrupted 7-0 Prolene sutures and closure was done transversely.  At the completion, there was a good Doppler signal in the wrist and also a palpable radial pulse.  The artery was irrigated with papaverine.  Hemostasis was obtained in the wound.  The wound was closed with a deep layer of 3-0 Vicryl and the skin closed with 4-0 Vicryl.  A sterile dressing was applied.  The patient tolerated the procedure well and was transferred to the recovery room in satisfactory condition.  All needle and sponge counts were correct. DD:  07/19/99 TD:  07/21/99 Job: [id number]

## 2010-07-31 NOTE — H&P (Signed)
[facility]  Patient:    Rose [name], Rose [name]                       MRN: [id number] Adm. Date:  07/09/99 Attending:  [name], M.D. Dictator:   Marlowe Kays, P.A. CC:         Evette Georges, M.D. [facility]                         History and Physical  CVTS:  #[id number]                DATE OF BIRTH:  07/21/1940  CHIEF COMPLAINT:  Renovascular hypertension.  HISTORY OF PRESENT ILLNESS:  Rose [name] is a pleasant 70 year old female referred by Dr. Evette Georges for evaluation of uncontrollable hypertension. Apparently the patients condition has been present for about 10-15 years.  She has been on  Altace.  In March 2000, while visiting her primary care physicians office, her blood pressure was 220/98, and on physical examination an abdominal bruit was heard.  The follow-up MRI on June 15, 1999, suggested significant renal artery stenosis.  She will undergo an arteriogram with possible PTA on July 09, 1999.  The patient denies any shortness of breath, dyspnea on exertion, nausea, vomiting, or diaphoresis.  No diabetes mellitus.  No palpitations or chest pain.  No history of PE of DVT.  No symptoms of TIA, CVA, or amaurosis fugax.  Her physical examination is remarkable for an abdominal bruit, as well as bilateral carotid bruits.  There are no other significant findings.  PAST MEDICAL HISTORY:  1. Renovascular hypertension.  2. History of "nervous breakdown", remote.  3. Severe ADD (followed by Dr. Dub Amis).  4. History of hypothyroidism.  5. Allergic rhinitis.  6. Left carotid artery stenosis 50%-79% (newly diagnosed).  7. Arthritis.  8. History of pneumonia in 1959.  9. Significant kyphosis. 10. Status post fracture of the fourth and fifth digits in 1998. 11. Anxiety.  PAST SURGICAL HISTORY: 1. Status post appendectomy and tonsillectomy. 2. Status post Mortons neuroma, removed from the feet. 3. Status post BCC removed  from the face x 2 in 1980.  CURRENT MEDICATIONS:  1. Concerta 54 mg p.o. q.d.  2. Wellbutrin 150 mg one p.o. b.i.d.  3. Tranxene 3.75 mg one p.o. t.i.d.  4. Altace 10 mg one p.o. b.i.d.  5. Vivelle q.d., unknown dose.  6. Lasix 20 mg one p.o. q.d.  7. Synthroid 0.125 mg q.o.d.  8. __________  10 mg p.o. b.i.d.  9. Allegra 60 mg p.o. p.r.n. 10. Glucosamine p.o. q.d., unknown dose. 11. Vitamin E and calcium q.d.  ALLERGIES:  ASPIRIN AND AMOXICILLIN.  REVIEW OF SYSTEMS:  See the HPI and the past medical history for significant positives.  The patient has experienced a 10 pound weight loss in the last 18 months, after she had periodontal work.  The rest of the review of systems is essentially negative.  FAMILY HISTORY:  The patient is adopted.  She does not know anything about her family history.  SOCIAL HISTORY:  She is single.  She has been divorced for 19 years.  She had four children, one died in 24.  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  She quit three years ago the use of tobacco, unknown amount.  She denies any alcohol intake.  PHYSICAL EXAMINATION:  GENERAL:  A  well-developed, well-nourished 70 year old white female, in no acute distress, alert and oriented x 3.  VITAL SIGNS:  Blood pressure 180/80 on the right, 180/90 on the left, pulse 70,  respirations 18.  HEENT:  Head normocephalic, atraumatic.  Mild exophthalmos.  PERRLA.  EOMI. Funduscopic examination without abnormalities.  NECK:  Supple, a 4.0 cm jugular venous distention bilaterally.  There are bruits audible, more pronounced on the right than on the left.  No thyromegaly or lymphadenopathy.  CHEST:  Symmetrical on inspiration.  LUNGS:  Clear to auscultation bilaterally.  CARDIOVASCULAR:  A regular rate and rhythm with a 2/6 systolic murmur and an S4. No S3.  No rubs.  ABDOMEN:  Soft, nontender.  Bowel sounds x 4.  A 2/4 abdominal bruit  audible. o hepatosplenomegaly.  GENITOURINARY:  Deferred.  RECTAL:  Deferred.  EXTREMITIES:  No cyanosis, clubbing, or edema.  SKIN:  Reveals no ulcerations.  Normal hair pattern.  Warm temperature.  PERIPHERAL PULSES:  Carotids 2+ bilaterally.  A 1/4 bruit on the left.  A 2/4 bruit on the right.  Femoral 2+ bilaterally without bruits.  Popliteal, dorsalis pedis, and posterior tibialis 2+ bilaterally.  NEUROLOGIC:  Grossly normal.  Normal gait.  Deep tendon reflexes 2+ bilaterally. Muscle strength 5/5.  ASSESSMENT/PLAN:  A 70 year old white female with uncontrollable hypertension, ho will undergo a renal arteriogram with possible PTA by Dr. [name].  Dr. Edilia Bo has seen and evaluated this patient prior to the admission and has explained the risks and benefits involving the procedure, and the patient has agreed to continue.DD:  07/15/99 TD:  07/15/99 Job: [id number]

## 2010-07-31 NOTE — H&P (Signed)
[facility]  Patient:    Rose [name], Rose [name]                     MRN: [id number] Adm. Date:  [id number] Attending:  Bennye Alm CC:         Evette Georges, M.D. [facility]                         History and Physical  HISTORY:  This a pleasant 70 year old woman who had been having increasing problems with her blood pressure recently.  She was found to have fibromuscular dysplasia of her right renal artery and, also, a branch of her left renal artery.  She was admitted overnight for renal angioplasty, which was performed by Dr. Fredia Sorrow on Jul 17, 1999.  She had a nice result from her angioplasty and her blood pressure was well controlled after the procedure.  On postprocedural day #1, terminal ileum as noted that she had some mild upper extremity swelling, but a good radial pulse nd motor and sensory function were intact.  She was discharged to home.  The following morning, she awoke with some numbness along the ulnar distribution of her left hand.  She presented to the emergency department.  PAST MEDICAL HISTORY:  Significant for, in addition to her hypertension, attention deficit disorder.  She denies any history of diabetes or hypercholesterolemia. She She denies any history of previous myocardial infarction or congestive heart failure.  PAST SURGICAL HISTORY:  Significant for appendectomy and tonsillectomy.  SOCIAL HISTORY:  She quit smoking three years ago.  She is an Airline pilot.  FAMILY HISTORY:  She is unaware of, as she was adopted.  REVIEW OF SYSTEMS:  She has had a 10 pound weight loss in the past 18 months. S he attributes this to problems eating, as she has had periodontal work and has had  sores in her mouth.  She has a history of attention deficit disorder, and is followed by Dr. Jodi Marble.  MEDICATIONS:  Lasix 20 mg q.d.  Altace 10 mg p.o. b.i.d.  Concerta, she does not know the dose.  Wellbutrin, she does not know the  dose.   Tranxene, she does not know the dose.  Periostat, one p.o. b.i.d.  ALLERGIES:  Amoxicillin.  PHYSICAL EXAMINATION:  VITAL SIGNS:  Blood pressure 172/88.  Heart rate 80.  Saturation 97%. Respiratory rate 20.  NECK:  Right carotic bruit.  LUNGS:  Clear bilaterally to auscultation.  CARDIAC:  Regular rate and rhythm.  ABDOMEN:  Soft and nontender.  She is quite thin.  PERIPHERAL PULSES:  SHe has palpable femoral and peripheral pulses.  She has a palpable radial and brachial pulse bilaterally.  There is a pulsatile mass over the puncture site in the brachial artery in the left arm.  There is ecchymosis present.  NEUROLOGIC:  There are some mild paresthesias along the ulnar aspect of the left hand.  Motor function is intact.  IMPRESSION:  The patient appears to have developed a pseudoaneurysm of the brachial artery after catheterization via a brachial approach for angioplasty of bilateral renal artery stenoses secondary to fibromuscular dysplasia.  Given the evidence of paresthesias and possible nerve compression on examination, I think that repair of the pseudoaneurysm is indicated.  We discussed the procedure and potential complications in detail.  She understands the main risk would be wound complications.  She will be taken to the operating room for repair of the brachial artery.  DD:  07/19/99 TD:  07/19/99 Job: [id number]

## 2010-07-31 NOTE — Discharge Summary (Signed)
Rose [name], Rose [name]              ACCOUNT NO.:  [id number]   MEDICAL RECORD NO.:  [id number]          PATIENT TYPE:  INP   LOCATION:  5022                         FACILITY:  [facility]   PHYSICIAN:  Sharolyn Douglas, M.D.        DATE OF BIRTH:  Aug 05, 1940   DATE OF ADMISSION:  12/25/2003  DATE OF DISCHARGE:  12/29/2003                                 DISCHARGE SUMMARY   ADMITTING DIAGNOSES:  1.  Degenerative scoliosis with severe pain.  2.  Anxiety.  3.  Attention deficit hyperactivity disorder.  4.  Hypertension.  5.  History of ulcers in 1986.  6.  Hypothyroidism.  7.  Overactive bladder.   DISCHARGE DIAGNOSES:  1.  Status post L1-L5 posterior spinal fusion, doing well.  2.  Postoperative hemorrhagic anemia that required blood transfusion.  3.  Attention deficit hyperactive disorder.  4.  Hypertension.  5.  History of ulcers in 1986.  6.  Hypothyroidism.  7.  Overactive bladder.  8.  Postoperative mild hyponatremia and postoperative mild hypokalemia.   PROCEDURE:  On December 25, 2003 patient was taking to the operating room for  an L1-L5 posterior spinal fusion with pedicle screws  plus an L4-5 PLIF.  This was done by Sharolyn Douglas, M.D., assistant [facility]C.  Anesthesia used was general.   CONSULTS:  None.   LABORATORIES:  Preoperatively CBC with differential was within normal limits  with exception of a red count of 3.83.  Postoperatively H&H was monitored.  She reached a low of 8.1 and 23.0.  On December 25, 2003 she was transfused 2  units of packed red blood cells and increased up to 10.7 and 29.8.  Following day remained asymptomatic in that range until discharge.  PT, INR,  and PTT were checked preoperatively and were normal.  Complete metabolic  panel preoperatively was normal.  Postoperatively she did have mild  hyponatremia at 131.  Potassium was down mildly at 3.4.  Glucose was up at  149.  Calcium was 7.8, otherwise normal.  UA on October 7 was negative.  Blood  type October 7 was type A, Rh type positive, antibody screen negative.  EKG from October 7 shows normal sinus rhythm, septal infarct age  undetermined.  Additional cardiologist comments are since October 9 of 2000  more __________ with prior septal infarct.  Clinical correlation  recommended.  Read by Aggie Cosier, M.D.  X-rays from December 25, 2003 of  portable spine was used intraoperatively for localization and screw  placement.   BRIEF HISTORY:  Patient is a 70 year old female who is having increasing  progressive problems related to her back.  She has had scoliosis for a  number of years now but she is having increasing pain and problems.  At this  point the pain is getting somewhat severe.  She has failed conservative  management including activity modification, pain medications, anti-  inflammatories.  Discomfort had gotten to the point where it was interfering  with her activities and she wanted to proceed with correction before it got  any worse.  Risks and benefits of the  procedure were discussed with the  patient by Dr. Noel Gerold.  She indicated understanding and opted to proceed.   HOSPITAL COURSE:  On December 25, 2003 patient was taken to the operating  room for the above listed procedures, tolerated procedure well without any  intraoperative complications.  There was Hemovac drain placed intraoperative  with 350 mL of blood lost intraoperatively.  She was transferred to recovery  room in stable condition.  Postoperatively routine orthopedic spine protocol  was followed including 48 hours of prophylactic antibiotics.  Diet was held  at n.p.o. until she passed flatus at which time it was slowly advanced to  her regular diet.  Incentive spirometer was used q.1h.  Foley was  discontinued on postoperative day two.  DVT prophylaxis using a combination  of TED hose,  SCD's as well as early mobilization.  Physical therapy and  occupational therapy were consulted to work with the patient  on a daily  basis on progressive ambulation.  Brace used as well as back precautions.  She progressed along well with them.  Pain control was obtained initially  with combination of a PCA Dilaudid as well as Vicodin for pain.  She was  transitioned over strictly to p.o. pain medication by postoperative day and  pain control remained adequate thereafter.  Again, home medications were  restarted postoperatively.  She did progress along relatively well.  On  postoperative day one her hemoglobin was 8.1.  As previously dictated, she  was transfused at that time and responded nicely to the transfusion,  increasing up to 10.9 the following day.  Overnight on October 14 the  patient did have some agitation and anxiety issues.  Nursing did notice,  however, these resolved and she was not having any difficulty by the time we  saw her in the morning.  Again, on October 15 overnight she became somewhat  agitated with yelling at the staff.  There was some medications found in her  bed that night, five or six pills that were bagged, taken away from her.  She was instructed not to take any of her home medications, she should only  take her medications provided by the pharmacy so we can know all the  medications she is given to avoid any interactions or problems.  On October  15 in the morning she was feeling much better than she had been the previous  evening.  She was alert and oriented, not having any confusion.  By December 29, 2003 patient met all orthopedic goals.  She had progressed along well  with physical therapy.  Medically, she was stable and ready for discharge.   DISCHARGE PLAN:  Patient is a 70 year old female status post posterior  spinal fusion, doing well.   ACTIVITY:  Daily ambulation program.  Brace on when she is up.  Back  precautions at all times.  Daily dressing changes.  Keep incision dry x5  days until the drainage stops.  FOLLOWUP:  Follow up two weeks with Dr. Noel Gerold.    DISCHARGE MEDICATIONS:  1.  Vicodin for pain.  2.  Robaxin for muscle spasm.  3.  Multivitamin daily.  4.  Calcium daily.  5.  Laxative as needed.  6.  Avoid NSAIDs x3 months.  7.  Resume normal home medicines.   DIET:  Regular home diet.   CONDITION ON DISCHARGE:  Stable, improved.   DISPOSITION:  Patient being discharged to her home with her family's  assistance as well as [facility]  physical therapy and occupational  therapy.       CM/MEDQ  D:  02/19/2004  T:  02/19/2004  Job:  [id number]

## 2010-07-31 NOTE — Discharge Summary (Signed)
[facility]  Patient:    Rose [name], Rose [name] Visit Number: [id number] MRN: [id number]          Service Type: MED Location: [id number] Attending Physician:  Tresa Garter Dictated by:   Corwin Levins, M.D. [facility] Admit Date:  05/01/2001 Discharge Date: 05/08/2001                             Discharge Summary  DISCHARGE DIAGNOSES: 1. Intractable back pain and right lower extremity discomfort. 2. Hypertension. 3. Known history of cervical and lumbar degenerative joint disease. 4. Hypothyroidism. 5. History of attention deficient disorder. 6. Depression. 7. Fibromuscular dysplasia.  PROCEDURES: 1. MRI of LS spine May 02, 2001 with degenerative spondylitic changes    involving the lumbar disk, mainly L5-S1 and the facet joints mainly L3-L4,    L4-L5, L5-S1.  There was moderate to marked central canal stenosis.  There    was broad based central, paracentral, left posterolateral foraminal disk    herniations at L3-4 and L4-5 as well as central to right posterolateral    foraminal herniated nucleus pulposus L5-S1.  Also, some degenerative    changes involving SI joints. 2. Interventional radiology procedure February 21 with right L5 nerve root    block and transforaminal epidural injections 80 mg Depo-Medrol and 2 mg    Marcaine.  CONSULTS:  Dr. Lequita Halt, orthopedic.  HISTORY AND PHYSICAL:  See that dictated day of admission May 01, 2001.  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 Jodi Marble who presented for intractable back pain.  MRI was obtained, aspirin discontinued, and interventional radiologic procedure done as above as well as first parenteral and then p.o. narcotic pain control.  She was also started on a Medrol dose pack which seemed to help as well.  She gradually increased her ability to ambulate and by the time of discharge she was able  to "hop" in and out of bed. Blood pressure was mildly elevated during initial hospitalization possibly secondary to pain, +/- exacerbated by hospital use of Avapro as per protocol instead of her usual Diovan at home.  She is well controlled on 300 mg Avapro at this point and consideration should be made for changing to Avapro for outpatient blood pressure control may prove difficult with the previous Diovan.  Of note is a sedimentation rate February 17 which was within normal limits.  Hemoglobin 12.2, white blood cell count 11.1.  Urinalysis essentially normal February 17 as well.  DISPOSITION:  Discharged to home in good condition.  There are no specific activity or dietary restrictions.  She had lots of questions regarding her activity and we left it to common sense measures such as no heavy lifting, no heavy bending or stooping.  She needs to follow up with Dr. Jodi Marble regarding her anxiety and high dose Celexa and apparently she will be seeing the social services consultant prior to discharge for social concerns as well as following up with Dr. Baldo Daub in one to two weeks and is to call Dr. Baldo Daub as well for referral to Dr. Lequita Halt for follow-up lumbar epidural injections which can be done as an outpatient. Dictated by:   Corwin Levins, M.D. [facility] Attending Physician:  Tresa Garter DD:  05/08/01 TD:  05/08/01 Job: [id number]

## 2010-07-31 NOTE — Discharge Summary (Signed)
[facility]  Patient:    Rose [name], Rose [name] Visit Number: [id number] MRN: [id number]          Service Type: MED Location: [id number] Attending Physician:  Tresa Garter Dictated by:   Corwin Levins, M.D. [facility] Admit Date:  05/01/2001                             Discharge Summary  ADDENDUM  DISPOSITION:  She is discharged in good condition.  DISCHARGE MEDICATIONS:  1. Medrol Dosepak.  2. Vicodin five times per day #60 with no refill.  3. Diovan 150 mg p.o. q.d.  4. Synthroid 0.112 alternating with 0.125 mg every-other day.  5. Glucosamine daily.  6. Nexium 40 mg p.o. q.d.  7. FemHRT one q.d.  8. Celexa 80 mg p.o. q.d.  9. Arthrotec 75/200 one p.o. b.i.d. p.r.n. 10. Norvasc 5 mg p.o. q.d. 11. Wellbutrin 150 mg b.i.d. 12. Baby aspirin 81 mg p.o. q.d. 13. Zyprexa 2.5 one-half tablet q.h.s. Dictated by:   Corwin Levins, M.D. [facility] Attending Physician:  Tresa Garter DD:  05/08/01 TD:  05/08/01 Job: [id number]

## 2010-07-31 NOTE — Op Note (Signed)
NAMECARLEENA, Rose [name]              ACCOUNT NO.:  [id number]   MEDICAL RECORD NO.:  [id number]          PATIENT TYPE:  OIB   LOCATION:  5002                         FACILITY:  [facility]   PHYSICIAN:  Sharolyn Douglas, M.D.        DATE OF BIRTH:  Nov 22, 1940   DATE OF PROCEDURE:  07/14/2005  DATE OF DISCHARGE:                                 OPERATIVE REPORT   PREOPERATIVE DIAGNOSIS:  Cervical spondylotic radiculopathy.   PROCEDURE:  1.  Anterior cervical diskectomy C4/5, C5-6 and C6-7 with decompression of      the spinal cord and nerve roots bilaterally.  2.  Anterior cervical fusion C4-5, C5-6 and C6-7 with placement of three      allograft prosthesis spacers packed with local autogenous bone graft.  3.  Anterior cervical plating C4-C7 using the Abbott spine system.   SURGEON:  Sharolyn Douglas, M.D.   ASSISTANT:  Verlin Fester, P.A.   ANESTHESIA:  General endotracheal.   ESTIMATED BLOOD LOSS:  Minimal.   COMPLICATIONS:  None.   INDICATIONS:  The patient is a pleasant 70 year old female with progressive  neck and left greater than right upper extremity pain.  She has multilevel  cervical spondylosis and degenerative disk disease, most involved C4-5, C5-6  and C6-7 with foraminal narrowing and osteophytes.  At this point she has  elected to undergo ACDF three levels in hopes of improving her symptoms.  Risks and benefits were reviewed.   PROCEDURE:  She was identified in the holding area, taken to the operating  room, underwent general endotracheal anesthesia without difficulty, given  prophylactic IV antibiotics.  She was carefully positioned on the operating  room table with her neck in slight extension using the head rest.  5 pounds  halter traction applied.  The neck prepped, draped usual sterile fashion.  A  transverse incision was made at level of the cricoid cartilage in left side  of the neck.  Dissection was carried through the platysma.  The interval  between the SCM and strap  muscles medially was developed down to the  prevertebral space.  The esophagus, trachea, carotid sheath were identified  and protected at all times.  There were large anterior osteophytes at C4-5,  C5-6 and C6-7.  We placed a spinal needle at C4-5, took an x-ray to confirm  the level.  We then elevated the longus coli muscle out over the C4-5, C5-6  at C6-7 disk spaces bilaterally.  Self-retaining retractor placed.  The  microscope was draped and brought into the field.  The anterior osteophytes  were removed with Leksell rongeur.  Starting at C6-7 diskectomy was carried  back to the posterior longitudinal ligament.  Caspar distraction pins were  placed at C4-C5, C6-C7 vertebral bodies and gentle distraction applied.  We  worked back to the posterior vertebral margins.  High-speed bur used to take  down the uncovertebral joints which were enlarged and the posterior  vertebral margins.  2 mm Kerrison punch used to complete wide  foraminotomies.  We prepared the vertebral endplates.  We then placed a 6 mm  allograft prosthesis  spacer which had been packed with local bone graft from  the drill shavings.  This was countersunk 2 mm into the interspace.  We then  performed a similar procedure at C5-6. Again the disk space was severely  degenerative and the uncovertebral joints were overgrown.  Foraminotomies  were completed.  A second 6-mm allograft prosthesis spacer was then packed  with bone graft and countersunk into the C5-6 disk space.  We then turned  our attention to performing the same procedure at C4-5. At this level the  disk was even more collapsed and degenerative.  We were able to distract the  disk space up to 5 mm after taking down the uncovertebral joints.  Foraminotomies were completed.  We placed a 5 mm allograft prosthesis spacer  at C4-5 again packed with local bone graft.  This was again countersunk into  the interspace.  We then applied the anterior cervical plate from Z6-X0  with  eight 12 mm screws.  We had excellent screw purchase.  We ensured the  locking mechanism engaged.  The wound was irrigated.  Hemostasis achieved.  The esophagus, trachea, carotid sheath inspected.  There were no apparent  injuries.  Deep Penrose drain left in place.  Platysma closed with  interrupted 2-0 Vicryl, subcutaneous layer closed with interrupted 3-0  Vicryl followed by a running 4-0 subcuticular Vicryl suture on the skin  edges.  Benzoin, Steri-Strips placed.  Sterile dressing applied.  Aspen  collar placed.  The patient was extubated without difficulty and transferred  to recovery in stable condition.  It should be noted that my assistant,  Verlin Fester, P.A. assisted me throughout the procedure during the initial  exposure with retraction.  During the operation she helped by assisting  through the microscope, using the suction and retracting the soft tissue  structures.  During the fusion and plating she helped with holding the  instrumentation in place and with the wound closure.      Sharolyn Douglas, M.D.  Electronically Signed     MC/MEDQ  D:  07/14/2005  T:  07/15/2005  Job:  [id number]

## 2010-07-31 NOTE — Discharge Summary (Signed)
[facility]  Patient:    Rose [name], Rose [name]                     MRN: [id number] Adm. Date:  [id number] Disc. Date: [id number] Attending:  Bennye Alm Dictator:   Lissa Merlin, P.A. CC:         Evette Georges, M.D. [facility]                           Discharge Summary  DATE OF BIRTH:  08-Mar-1941  ADMISSION DIAGNOSIS:  Left brachial artery pseudoaneurysm (the patient is status post renal artery angioplasty via left brachial artery approach on Jul 17, 1999).  PAST MEDICAL HISTORY: 1. Hypertension, difficult to control recently. 2. Recently diagnosed fibromuscular dysplasia of the right renal artery and a    branch of the left renal artery. 3. Status post renal angioplasty on Jul 17, 1999. 4. Attention deficit disorder.  DISCHARGE DIAGNOSIS:  Left brachial artery pseudoaneurysm.  PROCEDURE:  Repair of left brachial artery pseudoaneurysm on Jul 19, 1999 by Dr.  Edilia Bo.  BRIEF HISTORY:  The patient is a pleasant 70 year old female who had been having increasing problems controlling her blood pressure recently.  SHe was found to ave fibromuscular dysplasia of her right renal artery and a branch of her left renal artery per renal angioplasty on Jul 17, 1999.  She had a nice result from her angioplasty and her blood pressure was well controlled after the procedure.  On  postprocedural day #1, it was noted that she had mild upper extremity swelling, but a good radial pulse, and motor and sensory function were intact.  She was deemed suitable for discharge to home and was discharged.  The following morning, she awoke with some numbness along the ulnar distribution of her left hand.  She presented to the emergency department.  Dr. Edilia Bo was called to evaluate the patient.  He examined the patient and believed that she had developed a pseudoaneurysm of the brachial artery after catheterization via brachial approach for angioplasty of the  bilateral renal artery stenosis secondary to fibromuscular dysplasia.  He advised repair of the pseudoaneurysm.  He discussed the procedure, risks, benefits and details of the operation with the patient.  It was agreed to proceed.  She understood that the main risk would be wound complications.  HOSPITAL COURSE:  She was taken to the operating room for repair of the brachial artery.  She tolerated the procedure well and was taken to the recovery room in  stable condition.  On postoperative day #1, her vital signs were stable.  She was afebrile.  She was alert and oriented.  Her incision appeared fine.  She had a palpable radial pulse.  She was deemed suitable for discharge to home.  She was  subsequently discharged to home.  SPECIAL INSTRUCTIONS:  The patient was instructed to call the office if she noted anything unusual with regard to her wound such as redness, swelling, discharge,  bleeding, numbness, weakness or paresthesias.  She was told to go to the emergency room if she experienced further complications such as numbness or weakness in the extremity, and Dr. Edilia Bo would follow up with her.  She was told to avoid strenuous activity and otherwise engage in activity as tolerated.  She was told  that the office would call her with regard to her follow-up appointment.  She was told to resume her previous home medications.  She was told to keep her left arm elevated when at rest.  She was tolerated to keep the area clean and dry and use soap and water only.  She was told that she could shower.  MEDICATIONS:  As above, she was told to resume her home medications with the addition of Vicodin, 1-2 p.o. q.4-6h. p.r.n. pain.  ALLERGIES:  Amoxicillin, which causes rash.  FOLLOW-UP:  The patient will see Dr. Edilia Bo on May 23 at the [facility] office. DD:  07/21/99 TD:  07/23/99 Job: [id number]

## 2010-07-31 NOTE — Assessment & Plan Note (Signed)
[facility]                             PULMONARY OFFICE NOTE   EVERETT, RICCIARDELLI                     MRN:          [id number]  DATE:02/15/2006                            DOB:          1940/05/12    This is a 70 year old white female remote smoker with a persistent cough  dating back to an acute cold that she developed in August and  persistent since that time.  The cough typically is worse in the  afternoon or evenings and is productive of minimum amounts of white  sputum, although she has a sensation of constant drainage down her  throat.  She actually denies any nocturnal or early morning awakening  due to excessive drainage, fevers, chills, sweats, chest pain or leg  swelling.   She also denies any change in weather, environmental triggers and denies  any improvement from previous exposure to prednisone and several  different courses of antibiotics.   PAST MEDICAL HISTORY:  Significant for hypertension, note that she is  not on ACE inhibitors but does take relatively high doses of Norvasc and  is status post multiple back surgeries.   ALLERGIES:  AMOXICILLIN and MORPHINE, nonspecific reactions.   MEDICATIONS:  Taken in detail on worksheet dated February 15, 2006.  Although she takes Nexium daily, it is not related to meals.   SOCIAL HISTORY:  She quit smoking 13 years ago.  Denies any unusual  travel, pet or hobby exposure.  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   FAMILY HISTORY:  Unknown as she is adopted.   REVIEW OF SYSTEMS:  Taken in detail on the worksheet and negative other  than above.   PHYSICAL EXAMINATION:  This is a pleasant 70 year old white female with  a bit of an unusual affect and attitude.  She has stable vital signs.  HEENT:  It reveals minimal cobblestoning.  No evidence of excessive  postnasal drainage  yet she clears the throat frequently during the  interview exam.  Ear canals are clear bilaterally.  No cough  elicited.  Neck supple without cervical adenopathy or tenderness.  Trachea is  midline.  No thyromegaly.  LUNG FIELDS:  Perfectly clear bilaterally to auscultation and  percussion.  HEART:  There is a regular rate and rhythm without murmur, rub, or  gallop present.  ABDOMEN:  Soft, benign without no palpable organomegaly, masses,  tenderness.  EXTREMITIES:  Warm without calf tenderness, cyanosis, clubbing or edema.   CT scan of the chest was reviewed from November 29, 2005 and suggests a  tree and bud parenchymal pattern consistent with bronchiolitis along  with COPD changes.   IMPRESSION:  This patient does not have typical chronic obstructive  pulmonary disease in that she is not limited by dyspnea nor does she  have any morning exacerbation of cough or sputum production.  In fact,  the more I talk to her, the more convinced I became she has a cyclical  cough that is upper airway in nature.  I note that she is on high doses  of calcium channel blockers which may relax the esophageal sphincter  and  allow reflux to occur and that she is taking Nexium unrelated to meals,  which may reduce its benefit.   I therefore spent extra time showing her a diagram of a cyclical cough  to try to elicit her enthusiastic participation in heavy cough  suppression with a combination of Phenergan BC with  codeine, Nexium  taken before her first and last meal every day for at least two weeks  and prednisone 10 mg tablets #14 to be tapered off over six days.   If her cough continues, the next logical step on the workup was to  proceed with a sinus CT scan and I have given the phone number to call  for this purpose.  If the cough does resolve, no further pulmonary  follow up is needed.     Charlaine Dalton. Sherene Sires, MD, [facility]  Electronically Signed    MBW/MedQ  DD: 02/15/2006  DT: 02/15/2006  Job #: [id number]   cc:   Rosalyn Gess. Norins, MD

## 2010-08-12 ENCOUNTER — Ambulatory Visit: Payer: Medicare Other | Admitting: Internal Medicine

## 2010-08-13 ENCOUNTER — Encounter: Payer: Self-pay | Admitting: Internal Medicine

## 2010-08-14 ENCOUNTER — Ambulatory Visit (INDEPENDENT_AMBULATORY_CARE_PROVIDER_SITE_OTHER): Payer: Medicare Other | Admitting: Internal Medicine

## 2010-08-14 VITALS — BP 100/60 | HR 80 | Temp 97.1°F | Wt 125.0 lb

## 2010-08-14 DIAGNOSIS — R05 Cough: Secondary | ICD-10-CM

## 2010-08-14 DIAGNOSIS — R059 Cough, unspecified: Secondary | ICD-10-CM

## 2010-08-14 MED ORDER — BENZONATATE 100 MG PO CAPS: 100.0000 mg | ORAL_CAPSULE | Freq: Three times a day (TID) | ORAL | Status: AC | PRN

## 2010-08-14 MED ORDER — PREDNISONE 10 MG PO TABS: 10.0000 mg | ORAL_TABLET | Freq: Every day | ORAL | Status: AC

## 2010-08-14 NOTE — Progress Notes (Signed)
  Subjective:    Patient ID: Rose [name], female    DOB: 11/24/1940, 70 y.o.   MRN: [id number]  HPI Patient presents with a h/o 3 weeks of cough. NO fever, chills. She does produce some mucus that isn't purulent. She does c/o post-nasal drainage.   I have reviewed the patient's medical history in detail and updated the computerized patient record.    Review of Systems  Constitutional: Negative.   HENT: Negative.   Eyes: Negative.   Respiratory: Positive for cough. Negative for chest tightness and wheezing.   Cardiovascular: Negative.   Gastrointestinal: Negative.   Neurological: Negative.   Hematological: Negative.        Objective:   Physical Exam Vitals noted HEENT- Throat clear Chest - no rales, wheezes or rhonchi Cor- RRR       Assessment & Plan:  Cough - no sign bacterial infection. Suspect cyclical cough.  Plan - robitussin DM, short course of prednisone, benzonatate perles, loratadine

## 2010-08-14 NOTE — Telephone Encounter (Signed)
refill 

## 2010-08-14 NOTE — Patient Instructions (Signed)
Cough - no evidence of infection. May be triggered by allergy and then becomes a cyclical cough. Plan - robutssin DM or the equivalent; prednisone 10mg  once a day for 7 days; benzonatate perles 1 three times a day for 7 days; loratadine 10mg  once a day as long as you have any symptoms of post-nasal drainage.

## 2010-08-24 ENCOUNTER — Telehealth: Payer: Self-pay | Admitting: *Deleted

## 2010-08-24 DIAGNOSIS — R05 Cough: Secondary | ICD-10-CM

## 2010-08-24 DIAGNOSIS — R053 Chronic cough: Secondary | ICD-10-CM

## 2010-08-24 NOTE — Telephone Encounter (Signed)
Patient informed. 

## 2010-08-24 NOTE — Telephone Encounter (Signed)
Reviewed notes and meds: if she has failed steroids, benzonatate, cough syrup and is already on PPI recommend consult from pulmonary.  Request submitted.

## 2010-08-24 NOTE — Telephone Encounter (Signed)
Pt c/o no improvement in cough since last OV. Please advise.

## 2010-09-01 DIAGNOSIS — R413 Other amnesia: Secondary | ICD-10-CM

## 2010-09-07 DIAGNOSIS — R413 Other amnesia: Secondary | ICD-10-CM

## 2010-09-14 ENCOUNTER — Telehealth: Payer: Self-pay | Admitting: *Deleted

## 2010-09-14 NOTE — Telephone Encounter (Signed)
Keep pulmonary appointment - they are very attuned to allergy and can help make this diagnosis

## 2010-09-14 NOTE — Telephone Encounter (Signed)
Pt was just referred to pulmonary. She feels strongly that she needs to see allergist first, please advise.

## 2010-09-15 NOTE — Telephone Encounter (Signed)
Left vm for pt

## 2010-09-17 ENCOUNTER — Institutional Professional Consult (permissible substitution): Payer: Medicare Other | Admitting: Pulmonary Disease

## 2010-09-23 ENCOUNTER — Ambulatory Visit (INDEPENDENT_AMBULATORY_CARE_PROVIDER_SITE_OTHER): Payer: Medicare Other | Admitting: Pulmonary Disease

## 2010-09-23 ENCOUNTER — Encounter: Payer: Self-pay | Admitting: Pulmonary Disease

## 2010-09-23 VITALS — BP 102/62 | HR 87 | Temp 98.4°F | Ht 63.5 in | Wt 127.6 lb

## 2010-09-23 DIAGNOSIS — R059 Cough, unspecified: Secondary | ICD-10-CM | POA: Insufficient documentation

## 2010-09-23 DIAGNOSIS — R05 Cough: Secondary | ICD-10-CM | POA: Insufficient documentation

## 2010-09-23 NOTE — Patient Instructions (Signed)
Stay on your nasal rinses Stop your current antihistamine, and start chlorpheniramine 8mg  at bedtime Start Qnasal nasal spray, 2 sprays in each nostril each am.  Do not sniff when using, just let inhaler do the work.  Please call in about 2-3 weeks to let me know how things are going.

## 2010-09-23 NOTE — Progress Notes (Signed)
  Subjective:    Patient ID: Rose [name], female    DOB: [birth year], 70 y.o.   MRN: [id number]  HPI The pt comes in today for evaluation of chronic cough.  She began to cough a few months ago, and was productive of only scant clear mucus.  She was treated with abx/pred/tessalon pearls, with no improvement.  She admits to having a lot of postnasal drip, and thinks it may be due to her "cats".  She has been doing nasal rinses, and feel they do help some.  She denies chest congestion, and has no breathing issues.  She denies any reflux sx currently.  She has tried nonsedating antihistamines, but has not tried some of the more potent ones.  She has not had a recent cxr, and denies any h/o asthma.    Review of Systems  Constitutional: Negative for fever and unexpected weight change.  HENT: Positive for congestion and sneezing. Negative for ear pain, nosebleeds, sore throat, rhinorrhea, trouble swallowing, dental problem, postnasal drip and sinus pressure.   Eyes: Negative for redness and itching.  Respiratory: Positive for shortness of breath. Negative for cough, chest tightness and wheezing.   Cardiovascular: Negative for palpitations and leg swelling.  Gastrointestinal: Negative for nausea and vomiting.  Genitourinary: Negative for dysuria.  Musculoskeletal: Negative for joint swelling.  Skin: Negative for rash.  Neurological: Negative for headaches.  Hematological: Does not bruise/bleed easily.  Psychiatric/Behavioral: Negative for dysphoric mood. The patient is not nervous/anxious.        Objective:   Physical Exam Constitutional:  Well developed, no acute distress  HENT:  Nares patent without discharge, mild mucosal inflamm changes.  Oropharynx without exudate, palate and uvula are normal  Eyes:  Perrla, eomi, no scleral icterus  Neck:  No JVD, no TMG  Cardiovascular:  Normal rate, regular rhythm, no rubs or gallops.  No murmurs        Intact distal pulses  Pulmonary :   Normal breath sounds, no stridor or respiratory distress   No rales, rhonchi, or wheezing  Abdominal:  Soft, nondistended, bowel sounds present.  No tenderness noted.   Musculoskeletal:  No lower extremity edema noted.  Lymph Nodes:  No cervical lymphadenopathy noted  Skin:  No cyanosis noted  Neurologic:  Alert, appropriate, moves all 4 extremities without obvious deficit.         Assessment & Plan:

## 2010-09-25 ENCOUNTER — Telehealth: Payer: Self-pay | Admitting: Pulmonary Disease

## 2010-09-25 NOTE — Telephone Encounter (Signed)
[email or web address].Karynn Deblasi, CMA  

## 2010-09-27 NOTE — Assessment & Plan Note (Signed)
The pt's cough sounds more upper airway in origin, and specifically more due to postnasal drip.  I would like to try her on a stronger antihistamine, and will add nasal ICS as a trial to see if this helps.  If she continues to have issues, she will need a cxr and spirometry for further evaluation.  She is to call me if she does not improve.

## 2010-09-30 NOTE — Telephone Encounter (Signed)
lmomtcb  

## 2010-10-01 NOTE — Telephone Encounter (Signed)
Spoke with the pt and she states the 8mg  of the chlorpheniramine made her feel jittery so she cut it in half and has been fine since so nothing further needed. Carron Curie, CMA

## 2010-10-13 ENCOUNTER — Other Ambulatory Visit: Payer: Self-pay | Admitting: Internal Medicine

## 2010-10-14 NOTE — Telephone Encounter (Signed)
Rx Done . 

## 2010-10-15 ENCOUNTER — Other Ambulatory Visit: Payer: Self-pay | Admitting: Internal Medicine

## 2010-10-15 ENCOUNTER — Telehealth: Payer: Self-pay | Admitting: Pulmonary Disease

## 2010-10-15 NOTE — Telephone Encounter (Signed)
Called and spoke with pt.  Pt saw KC on 7/11 and was given sample of Qnasl.  Pt states she is feeling "great"  States she only coughs "some" if she doesn't take the Qnasl.  Pt is also taking the Chlorpheniramine 4mg  qhs. (couldn't tolerate 8mg - made her feel jittery- see phone note from 7/13 for complete details) Pt is requesting rx for Qnasl.  KC, please advise if ok to send rx.  Thanks.

## 2010-10-16 ENCOUNTER — Telehealth: Payer: Self-pay | Admitting: Pulmonary Disease

## 2010-10-16 MED ORDER — FLUTICASONE PROPIONATE 50 MCG/ACT NA SUSP: 2.0000 | Freq: Every day | NASAL | Status: DC

## 2010-10-16 MED ORDER — BECLOMETHASONE DIPROPIONATE 80 MCG/ACT NA AERS: 2.0000 | INHALATION_SPRAY | NASAL | Status: DC

## 2010-10-16 MED ORDER — FLUTICASONE PROPIONATE 50 MCG/ACT NA SUSP: NASAL | Status: DC

## 2010-10-16 NOTE — Telephone Encounter (Signed)
KC the qnasal is too expensive and the pt wants to know if there is something cheaper?  Please advise. thanks

## 2010-10-16 NOTE — Telephone Encounter (Signed)
Pt aware of kc's response and that rx was sent to requested pharmacy

## 2010-10-16 NOTE — Telephone Encounter (Signed)
lmomtcb for pt to make her aware of flonase sent to the pharmacy and directions.

## 2010-10-16 NOTE — Telephone Encounter (Signed)
Can try flonase 2 each nare each am.  Will have to sniff as she sprays with this product, and point away from nasal septum. #1, 6 fills.

## 2010-10-16 NOTE — Telephone Encounter (Signed)
Let her know ok to stay on Qnasal and antihistamine if it is working for her Can call in rx with #1, 45fills. Don't need to see her if cough has resolved, and f/u with me as needed.

## 2010-10-16 NOTE — Telephone Encounter (Signed)
Called, spoke with pt.  She is aware KC recs she try flonase in place of the qnasal.  She is aware to use 2 sprays each nare each am and she will have to sniff as she sprays this product as well as point it aware from the nasal septum.  She verbalized understanding of these instructions and aware rx sent to [facility].  She verbalized understanding of this.

## 2010-11-05 ENCOUNTER — Other Ambulatory Visit: Payer: Self-pay | Admitting: Internal Medicine

## 2010-11-23 ENCOUNTER — Ambulatory Visit: Payer: Medicare Other | Attending: Orthopaedic Surgery | Admitting: Physical Therapy

## 2010-11-23 DIAGNOSIS — IMO0001 Reserved for inherently not codable concepts without codable children: Secondary | ICD-10-CM | POA: Insufficient documentation

## 2010-11-23 DIAGNOSIS — M2569 Stiffness of other specified joint, not elsewhere classified: Secondary | ICD-10-CM | POA: Insufficient documentation

## 2010-11-23 DIAGNOSIS — M542 Cervicalgia: Secondary | ICD-10-CM | POA: Insufficient documentation

## 2010-11-24 ENCOUNTER — Ambulatory Visit: Payer: Medicare Other | Admitting: Physical Therapy

## 2010-11-25 ENCOUNTER — Other Ambulatory Visit: Payer: Self-pay

## 2010-11-30 ENCOUNTER — Ambulatory Visit: Payer: Medicare Other | Admitting: Physical Therapy

## 2010-12-01 ENCOUNTER — Other Ambulatory Visit: Payer: Self-pay | Admitting: *Deleted

## 2010-12-01 MED ORDER — VALSARTAN 160 MG PO TABS: 160.0000 mg | ORAL_TABLET | Freq: Every day | ORAL | Status: DC

## 2010-12-07 ENCOUNTER — Ambulatory Visit: Payer: Medicare Other | Admitting: Physical Therapy

## 2010-12-09 ENCOUNTER — Ambulatory Visit: Payer: Medicare Other | Admitting: Physical Therapy

## 2010-12-10 ENCOUNTER — Ambulatory Visit: Payer: Medicare Other | Admitting: Physical Therapy

## 2010-12-15 ENCOUNTER — Ambulatory Visit: Payer: Medicare Other | Attending: Orthopaedic Surgery | Admitting: Physical Therapy

## 2010-12-15 DIAGNOSIS — IMO0001 Reserved for inherently not codable concepts without codable children: Secondary | ICD-10-CM | POA: Insufficient documentation

## 2010-12-15 DIAGNOSIS — M542 Cervicalgia: Secondary | ICD-10-CM | POA: Insufficient documentation

## 2010-12-15 DIAGNOSIS — M2569 Stiffness of other specified joint, not elsewhere classified: Secondary | ICD-10-CM | POA: Insufficient documentation

## 2010-12-16 ENCOUNTER — Ambulatory Visit: Payer: Medicare Other | Admitting: Physical Therapy

## 2010-12-17 ENCOUNTER — Ambulatory Visit: Payer: Medicare Other | Admitting: Physical Therapy

## 2010-12-22 ENCOUNTER — Ambulatory Visit: Payer: Medicare Other | Admitting: Physical Therapy

## 2010-12-24 ENCOUNTER — Ambulatory Visit: Payer: Medicare Other | Admitting: Physical Therapy

## 2010-12-25 ENCOUNTER — Ambulatory Visit: Payer: Medicare Other | Admitting: Physical Therapy

## 2010-12-28 ENCOUNTER — Ambulatory Visit: Payer: Medicare Other | Admitting: Physical Therapy

## 2010-12-30 ENCOUNTER — Ambulatory Visit: Payer: Medicare Other | Admitting: Physical Therapy

## 2010-12-30 ENCOUNTER — Other Ambulatory Visit: Payer: Self-pay | Admitting: Internal Medicine

## 2011-01-01 ENCOUNTER — Ambulatory Visit: Payer: Medicare Other | Admitting: Physical Therapy

## 2011-01-04 ENCOUNTER — Ambulatory Visit: Payer: Medicare Other | Admitting: Physical Therapy

## 2011-01-06 ENCOUNTER — Encounter: Payer: Medicare Other | Admitting: Physical Therapy

## 2011-01-08 ENCOUNTER — Ambulatory Visit: Payer: Medicare Other | Admitting: Physical Therapy

## 2011-01-11 ENCOUNTER — Ambulatory Visit: Payer: Medicare Other | Admitting: Physical Therapy

## 2011-01-13 ENCOUNTER — Encounter: Payer: Medicare Other | Admitting: Physical Therapy

## 2011-01-15 ENCOUNTER — Encounter: Payer: Medicare Other | Admitting: Physical Therapy

## 2011-01-18 ENCOUNTER — Encounter: Payer: Medicare Other | Admitting: Physical Therapy

## 2011-01-20 ENCOUNTER — Other Ambulatory Visit (INDEPENDENT_AMBULATORY_CARE_PROVIDER_SITE_OTHER): Payer: Medicare Other | Admitting: *Deleted

## 2011-01-20 DIAGNOSIS — I6529 Occlusion and stenosis of unspecified carotid artery: Secondary | ICD-10-CM

## 2011-01-21 ENCOUNTER — Encounter: Payer: Medicare Other | Admitting: Physical Therapy

## 2011-01-25 ENCOUNTER — Ambulatory Visit: Payer: Medicare Other | Attending: Orthopaedic Surgery | Admitting: Physical Therapy

## 2011-01-25 DIAGNOSIS — M2569 Stiffness of other specified joint, not elsewhere classified: Secondary | ICD-10-CM | POA: Insufficient documentation

## 2011-01-25 DIAGNOSIS — IMO0001 Reserved for inherently not codable concepts without codable children: Secondary | ICD-10-CM | POA: Insufficient documentation

## 2011-01-25 DIAGNOSIS — M542 Cervicalgia: Secondary | ICD-10-CM | POA: Insufficient documentation

## 2011-01-27 ENCOUNTER — Ambulatory Visit: Payer: Medicare Other | Admitting: Physical Therapy

## 2011-01-28 NOTE — Procedures (Unsigned)
CAROTID DUPLEX EXAM  INDICATION:  Follow up carotid disease.  HISTORY: Diabetes:  No. Cardiac:  No. Hypertension:  Yes. Smoking:  Previous. Previous Surgery:  Left brachial pseudoaneurysm repair. CV History:  Known left subclavian steal, known fibromuscular dysplasia. Amaurosis Fugax No, Paresthesias No, Hemiparesis No.                                      RIGHT                LEFT Brachial systolic pressure: Brachial Doppler waveforms: Vertebral direction of flow:        Abnormal             Retrograde DUPLEX VELOCITIES (cm/sec) CCA peak systolic                   69                   101 ECA peak systolic                   319                  155 ICA peak systolic                   173                  175 (P), 257 (D) ICA end diastolic                   52                   56 (P), 93 (D) PLAQUE MORPHOLOGY:                  Heterogenous/calcified                   Heterogenous/calcified PLAQUE AMOUNT:                      Moderate             Moderate-to- severe PLAQUE LOCATION:                    ECA/ICA              CCA/ECA/ICA  IMPRESSION: 1. 40% to 59% bilateral internal carotid artery stenosis. 2. Elevated velocities observed in the left distal internal carotid     artery, which are indicative of active fibromuscular dysplasia. 3. Left vertebral artery is retrograde with monophasic left subclavian     waveforms. 4. Right vertebral artery is antegrade but abnormal with an     essentially normal right subclavian artery.  ___________________________________________ [name], M.D.  LT/MEDQ  D:  01/20/2011  T:  01/20/2011  Job:  [id number]

## 2011-01-29 ENCOUNTER — Other Ambulatory Visit: Payer: Self-pay | Admitting: Vascular Surgery

## 2011-01-29 ENCOUNTER — Encounter: Payer: Self-pay | Admitting: Vascular Surgery

## 2011-01-29 DIAGNOSIS — I773 Arterial fibromuscular dysplasia: Secondary | ICD-10-CM

## 2011-01-29 DIAGNOSIS — I6529 Occlusion and stenosis of unspecified carotid artery: Secondary | ICD-10-CM

## 2011-01-29 DIAGNOSIS — G458 Other transient cerebral ischemic attacks and related syndromes: Secondary | ICD-10-CM

## 2011-02-02 ENCOUNTER — Ambulatory Visit: Payer: Medicare Other | Admitting: Physical Therapy

## 2011-02-08 ENCOUNTER — Encounter: Payer: Medicare Other | Admitting: Physical Therapy

## 2011-02-10 ENCOUNTER — Encounter: Payer: Medicare Other | Admitting: Physical Therapy

## 2011-02-16 ENCOUNTER — Ambulatory Visit: Payer: Medicare Other | Attending: Orthopaedic Surgery | Admitting: Physical Therapy

## 2011-02-16 DIAGNOSIS — IMO0001 Reserved for inherently not codable concepts without codable children: Secondary | ICD-10-CM | POA: Insufficient documentation

## 2011-02-16 DIAGNOSIS — M2569 Stiffness of other specified joint, not elsewhere classified: Secondary | ICD-10-CM | POA: Insufficient documentation

## 2011-02-16 DIAGNOSIS — M542 Cervicalgia: Secondary | ICD-10-CM | POA: Insufficient documentation

## 2011-02-23 ENCOUNTER — Ambulatory Visit: Payer: Medicare Other | Admitting: Physical Therapy

## 2011-02-25 ENCOUNTER — Ambulatory Visit: Payer: Medicare Other | Admitting: Physical Therapy

## 2011-03-01 ENCOUNTER — Ambulatory Visit: Payer: Medicare Other | Admitting: Physical Therapy

## 2011-03-04 ENCOUNTER — Ambulatory Visit: Payer: Medicare Other | Admitting: Physical Therapy

## 2011-03-10 ENCOUNTER — Ambulatory Visit: Payer: Medicare Other | Admitting: Physical Therapy

## 2011-03-24 ENCOUNTER — Ambulatory Visit: Payer: Medicare Other | Admitting: Physical Therapy

## 2011-03-25 ENCOUNTER — Ambulatory Visit: Payer: Medicare Other | Attending: Orthopaedic Surgery | Admitting: Physical Therapy

## 2011-03-25 DIAGNOSIS — M2569 Stiffness of other specified joint, not elsewhere classified: Secondary | ICD-10-CM | POA: Insufficient documentation

## 2011-03-25 DIAGNOSIS — M542 Cervicalgia: Secondary | ICD-10-CM | POA: Insufficient documentation

## 2011-03-25 DIAGNOSIS — IMO0001 Reserved for inherently not codable concepts without codable children: Secondary | ICD-10-CM | POA: Insufficient documentation

## 2011-03-26 ENCOUNTER — Ambulatory Visit: Payer: Medicare Other | Admitting: Physical Therapy

## 2011-03-29 ENCOUNTER — Ambulatory Visit: Payer: Medicare Other | Admitting: Physical Therapy

## 2011-03-31 ENCOUNTER — Ambulatory Visit: Payer: Medicare Other | Admitting: Physical Therapy

## 2011-04-02 ENCOUNTER — Ambulatory Visit: Payer: Medicare Other | Admitting: Physical Therapy

## 2011-04-05 ENCOUNTER — Ambulatory Visit: Payer: Medicare Other | Admitting: Physical Therapy

## 2011-04-07 ENCOUNTER — Ambulatory Visit: Payer: Medicare Other | Admitting: Physical Therapy

## 2011-04-09 ENCOUNTER — Ambulatory Visit: Payer: Medicare Other | Admitting: Physical Therapy

## 2011-04-13 ENCOUNTER — Encounter: Payer: Medicare Other | Admitting: Physical Therapy

## 2011-04-14 ENCOUNTER — Encounter: Payer: Medicare Other | Admitting: Physical Therapy

## 2011-04-15 ENCOUNTER — Encounter: Payer: Medicare Other | Admitting: Physical Therapy

## 2011-04-19 ENCOUNTER — Encounter: Payer: Medicare Other | Admitting: Physical Therapy

## 2011-04-21 ENCOUNTER — Encounter: Payer: Medicare Other | Admitting: Physical Therapy

## 2011-04-23 ENCOUNTER — Ambulatory Visit: Payer: Medicare Other | Admitting: Physical Therapy

## 2011-05-19 ENCOUNTER — Other Ambulatory Visit: Payer: Self-pay

## 2011-05-19 MED ORDER — LEVOTHYROXINE SODIUM 125 MCG PO TABS: 125.0000 ug | ORAL_TABLET | Freq: Every day | ORAL | Status: DC

## 2011-06-08 ENCOUNTER — Other Ambulatory Visit: Payer: Self-pay | Admitting: Internal Medicine

## 2011-06-15 ENCOUNTER — Other Ambulatory Visit: Payer: Self-pay | Admitting: Internal Medicine

## 2011-06-22 ENCOUNTER — Telehealth: Payer: Self-pay

## 2011-06-22 DIAGNOSIS — E785 Hyperlipidemia, unspecified: Secondary | ICD-10-CM

## 2011-06-22 NOTE — Telephone Encounter (Signed)
Pt called requesting order for lipid panel after having cholesterol checked at a fair and it was resulted at 250+.

## 2011-06-22 NOTE — Telephone Encounter (Signed)
Pt advise, labs ordered

## 2011-06-22 NOTE — Telephone Encounter (Signed)
K. 272.4

## 2011-06-24 ENCOUNTER — Other Ambulatory Visit (INDEPENDENT_AMBULATORY_CARE_PROVIDER_SITE_OTHER): Payer: Medicare Other

## 2011-06-24 ENCOUNTER — Encounter: Payer: Self-pay | Admitting: Internal Medicine

## 2011-06-24 DIAGNOSIS — E785 Hyperlipidemia, unspecified: Secondary | ICD-10-CM | POA: Diagnosis not present

## 2011-06-24 LAB — LDL CHOLESTEROL, DIRECT: Direct LDL: 161.2 mg/dL

## 2011-06-24 LAB — LIPID PANEL
HDL: 58.4 mg/dL (ref >39.00)
Triglycerides: 96 mg/dL (ref 0.0–149.0)

## 2011-06-29 ENCOUNTER — Telehealth: Payer: Self-pay | Admitting: Internal Medicine

## 2011-06-29 NOTE — Telephone Encounter (Signed)
FYI pt is moving out of [place] this week

## 2011-07-21 ENCOUNTER — Other Ambulatory Visit: Payer: Medicare Other

## 2011-07-21 ENCOUNTER — Ambulatory Visit: Payer: Medicare Other | Admitting: Vascular Surgery

## 2012-07-03 ENCOUNTER — Telehealth: Payer: Self-pay | Admitting: Internal Medicine

## 2012-07-03 NOTE — Telephone Encounter (Signed)
[facility] is calling to request refill on Patients amlodipine 10 mg, you can call it to [phone] or fax the order to [phone]

## 2012-07-03 NOTE — Telephone Encounter (Signed)
Ok to refill 

## 2012-07-04 MED ORDER — AMLODIPINE BESYLATE 10 MG PO TABS: ORAL_TABLET | ORAL | Status: DC

## 2012-07-04 NOTE — Telephone Encounter (Signed)
Faxed script bck to [facility]...Raechel Chute

## 2012-11-07 DIAGNOSIS — S8990XA Unspecified injury of unspecified lower leg, initial encounter: Secondary | ICD-10-CM | POA: Diagnosis not present

## 2012-11-07 DIAGNOSIS — E162 Hypoglycemia, unspecified: Secondary | ICD-10-CM | POA: Diagnosis not present

## 2013-01-31 ENCOUNTER — Telehealth: Payer: Self-pay

## 2013-01-31 NOTE — Telephone Encounter (Signed)
Phone call from [place] at a [facility] dentist office Dr Leonette Most ? States patient has a procedure tomorrow at 12:30. Patient has not been seen with them in over a year. Have recorded in the past that patient needs to be pre medicated for procedures. Need a written order from pcp whether or not patient need to be pre med. Please advise.

## 2013-01-31 NOTE — Telephone Encounter (Signed)
Letter done

## 2013-01-31 NOTE — Telephone Encounter (Signed)
Letter has been faxed to Dr Tomasa Hose office at [phone]

## 2013-02-16 DIAGNOSIS — M19049 Primary osteoarthritis, unspecified hand: Secondary | ICD-10-CM | POA: Diagnosis not present

## 2013-02-20 ENCOUNTER — Other Ambulatory Visit: Payer: Self-pay | Admitting: Orthopedic Surgery

## 2013-03-12 ENCOUNTER — Other Ambulatory Visit: Payer: Self-pay | Admitting: Internal Medicine

## 2013-05-09 DIAGNOSIS — L989 Disorder of the skin and subcutaneous tissue, unspecified: Secondary | ICD-10-CM | POA: Diagnosis not present

## 2013-05-09 DIAGNOSIS — J438 Other emphysema: Secondary | ICD-10-CM | POA: Diagnosis not present

## 2013-05-09 DIAGNOSIS — F3289 Other specified depressive episodes: Secondary | ICD-10-CM | POA: Diagnosis not present

## 2013-05-09 DIAGNOSIS — E039 Hypothyroidism, unspecified: Secondary | ICD-10-CM | POA: Diagnosis not present

## 2013-05-09 DIAGNOSIS — R911 Solitary pulmonary nodule: Secondary | ICD-10-CM | POA: Diagnosis not present

## 2013-05-09 DIAGNOSIS — I10 Essential (primary) hypertension: Secondary | ICD-10-CM | POA: Diagnosis not present

## 2013-05-09 DIAGNOSIS — F329 Major depressive disorder, single episode, unspecified: Secondary | ICD-10-CM | POA: Diagnosis not present

## 2013-05-09 DIAGNOSIS — R32 Unspecified urinary incontinence: Secondary | ICD-10-CM | POA: Diagnosis not present

## 2013-05-09 DIAGNOSIS — G589 Mononeuropathy, unspecified: Secondary | ICD-10-CM | POA: Diagnosis not present

## 2013-05-15 ENCOUNTER — Ambulatory Visit (HOSPITAL_BASED_OUTPATIENT_CLINIC_OR_DEPARTMENT_OTHER): Admission: RE | Admit: 2013-05-15 | Payer: Medicare Other | Source: Ambulatory Visit | Admitting: Orthopedic Surgery

## 2013-05-15 ENCOUNTER — Encounter (HOSPITAL_BASED_OUTPATIENT_CLINIC_OR_DEPARTMENT_OTHER): Admission: RE | Payer: Self-pay | Source: Ambulatory Visit

## 2013-05-15 SURGERY — CARPOMETACARPEL (CMC) SUSPENSION PLASTY
Anesthesia: Choice | Laterality: Right

## 2013-07-19 ENCOUNTER — Telehealth: Payer: Self-pay | Admitting: Physician Assistant

## 2013-07-19 ENCOUNTER — Encounter: Payer: Self-pay | Admitting: Physician Assistant

## 2013-07-19 ENCOUNTER — Ambulatory Visit (INDEPENDENT_AMBULATORY_CARE_PROVIDER_SITE_OTHER): Payer: Medicare Other | Admitting: Physician Assistant

## 2013-07-19 VITALS — BP 140/66 | HR 70 | Temp 98.0°F | Resp 14 | Ht 63.0 in | Wt 112.5 lb

## 2013-07-19 DIAGNOSIS — R0609 Other forms of dyspnea: Secondary | ICD-10-CM

## 2013-07-19 DIAGNOSIS — I10 Essential (primary) hypertension: Secondary | ICD-10-CM

## 2013-07-19 DIAGNOSIS — F3289 Other specified depressive episodes: Secondary | ICD-10-CM | POA: Diagnosis not present

## 2013-07-19 DIAGNOSIS — R053 Chronic cough: Secondary | ICD-10-CM

## 2013-07-19 DIAGNOSIS — E039 Hypothyroidism, unspecified: Secondary | ICD-10-CM | POA: Diagnosis not present

## 2013-07-19 DIAGNOSIS — R222 Localized swelling, mass and lump, trunk: Secondary | ICD-10-CM

## 2013-07-19 DIAGNOSIS — R05 Cough: Secondary | ICD-10-CM

## 2013-07-19 DIAGNOSIS — R918 Other nonspecific abnormal finding of lung field: Secondary | ICD-10-CM | POA: Insufficient documentation

## 2013-07-19 DIAGNOSIS — J449 Chronic obstructive pulmonary disease, unspecified: Secondary | ICD-10-CM

## 2013-07-19 DIAGNOSIS — E559 Vitamin D deficiency, unspecified: Secondary | ICD-10-CM | POA: Diagnosis not present

## 2013-07-19 DIAGNOSIS — J4489 Other specified chronic obstructive pulmonary disease: Secondary | ICD-10-CM

## 2013-07-19 DIAGNOSIS — R0989 Other specified symptoms and signs involving the circulatory and respiratory systems: Secondary | ICD-10-CM

## 2013-07-19 DIAGNOSIS — C44309 Unspecified malignant neoplasm of skin of other parts of face: Secondary | ICD-10-CM

## 2013-07-19 DIAGNOSIS — F32A Depression, unspecified: Secondary | ICD-10-CM | POA: Insufficient documentation

## 2013-07-19 DIAGNOSIS — R059 Cough, unspecified: Secondary | ICD-10-CM

## 2013-07-19 DIAGNOSIS — C44300 Unspecified malignant neoplasm of skin of unspecified part of face: Secondary | ICD-10-CM

## 2013-07-19 DIAGNOSIS — R0600 Dyspnea, unspecified: Secondary | ICD-10-CM

## 2013-07-19 DIAGNOSIS — F329 Major depressive disorder, single episode, unspecified: Secondary | ICD-10-CM

## 2013-07-19 MED ORDER — VALSARTAN 160 MG PO TABS: ORAL_TABLET | ORAL | Status: DC

## 2013-07-19 MED ORDER — LEVOTHYROXINE SODIUM 112 MCG PO TABS: 112.0000 ug | ORAL_TABLET | Freq: Every day | ORAL | Status: DC

## 2013-07-19 MED ORDER — CALCIUM-VITAMIN D 500-200 MG-UNIT PO TABS
1.0000 | ORAL_TABLET | Freq: Every day | ORAL | Status: DC
Start: 2013-07-19 — End: 2013-12-21

## 2013-07-19 MED ORDER — BUPROPION HCL ER (XL) 300 MG PO TB24: 300.0000 mg | ORAL_TABLET | Freq: Every day | ORAL | Status: DC

## 2013-07-19 MED ORDER — DIAZEPAM 5 MG PO TABS: 5.0000 mg | ORAL_TABLET | Freq: Two times a day (BID) | ORAL | Status: DC | PRN

## 2013-07-19 MED ORDER — DULOXETINE HCL 60 MG PO CPEP: 60.0000 mg | ORAL_CAPSULE | Freq: Every day | ORAL | Status: DC

## 2013-07-19 MED ORDER — SOLIFENACIN SUCCINATE 10 MG PO TABS: 10.0000 mg | ORAL_TABLET | Freq: Every day | ORAL | Status: DC

## 2013-07-19 MED ORDER — L-METHYLFOLATE 15 MG PO TABS: 1.0000 | ORAL_TABLET | Freq: Every day | ORAL | Status: DC

## 2013-07-19 MED ORDER — AMLODIPINE BESYLATE 10 MG PO TABS: ORAL_TABLET | ORAL | Status: DC

## 2013-07-19 NOTE — Telephone Encounter (Signed)
Relevant patient education assigned to patient using Emmi. ° °

## 2013-07-19 NOTE — Progress Notes (Signed)
Patient presents to clinic today for medication refill.  Patient will be formally re-establishing next week.  Patient also brings paperwork from her previous MD revealing CT Chest on 11/29/12 which showed a lung mass 0.9 cm in diameter concerning for malignancy.  Recommended PET scan.  Patient has not had imaging performed prior to moving back to this area.  Is requesting a PET scan.  Patient is a former smoker.  Denies cough, chest pain, hemoptysis.  Endorses occasional SOB. Patient with hx of emphysema diagnosed via same CT scan.   Past Medical History  Diagnosis Date  . Other tenosynovitis of hand and wrist   . Peripheral neuropathy   . Subclavian steal syndrome   . Carotid artery disease   . Personal history of alcoholism   . DJD (degenerative joint disease) of hip   . Myalgia and myositis, unspecified   . Atherosclerosis of renal artery   . History of depression   . Morton's neuroma     Hx of, Left  . Carotid bruit     Left  . History of spinal fusion   . Personal history of peptic ulcer disease   . Solitary cyst of breast   . Basal cell carcinoma of face   . Attention deficit disorder without mention of hyperactivity   . Unspecified hypothyroidism   . Benign heart murmur   . Esophageal reflux   . Hypertension   . Centrilobular emphysema   . Pulmonary nodule   . Chronic bronchitis     Current Outpatient Prescriptions on File Prior to Visit  Medication Sig Dispense Refill  . aspirin 81 MG tablet Take 81 mg by mouth daily.       . ferrous sulfate dried (SLOW FE) 160 (50 FE) MG TBCR Take 160 mg by mouth 2 (two) times daily.       . Glucosamine 500 MG TABS Take 1 tablet by mouth 2 (two) times daily. Glucosamine-Chondronton       No current facility-administered medications on file prior to visit.    Allergies  Allergen Reactions  . Amoxicillin     REACTION: unspecified  . Codeine   . Hydrocodone-Acetaminophen     REACTION: itching  . Hydromorphone Hcl   . Morphine  Sulfate     REACTION: unspecified    Family History  Problem Relation Age of Onset  . Adopted: Yes  . Schizophrenia Son     Deceased 56  . Hypertension Daughter     Renal  . Healthy Son     x1  . Healthy Daughter     x2    History   Social History  . Marital Status: Single    Spouse Name: N/A    Number of Children: Y  . Years of Education: N/A   Occupational History  . retired     Engineer, maintenance (IT), had own firm   Social History Main Topics  . Smoking status: Former Smoker -- 1.00 packs/day for 25 years    Types: Cigarettes    Quit date: 03/16/1983  . Smokeless tobacco: None  . Alcohol Use: None  . Drug Use: None  . Sexual Activity: None   Other Topics Concern  . None   Social History Narrative   [employer]-accounting      Married '60-4yr divorced; '84-2 years, divorced      3 daughters- '61, '70, '71; 2 sons-'53,'55 (schizophrenic-died); 10 grandchildren      Lives alone with 1 dog and 3 cats  Pt unsure of family history- was adopted.                  Review of Systems - See HPI.  All other ROS are negative.  BP 140/66  Pulse 70  Temp(Src) 98 F (36.7 C) (Oral)  Resp 14  Ht 5\' 3"  (1.6 m)  Wt 112 lb 8 oz (51.03 kg)  BMI 19.93 kg/m2  SpO2 98%  Physical Exam  Vitals reviewed. Constitutional: She is oriented to person, place, and time and well-developed, well-nourished, and in no distress.  HENT:  Head: Normocephalic and atraumatic.  Eyes: Conjunctivae are normal. Pupils are equal, round, and reactive to light.  Neck: Neck supple.  Cardiovascular: Normal rate, regular rhythm, normal heart sounds and intact distal pulses.   Pulmonary/Chest: Effort normal and breath sounds normal. No respiratory distress. She has no wheezes. She has no rales. She exhibits no tenderness.  Neurological: She is alert and oriented to person, place, and time. No cranial nerve deficit.  Skin: Skin is warm and dry. No rash noted.  Psychiatric: Affect normal.    Assessment/Plan: Essential hypertension, benign Asymptomatic.  Medications refilled.  Take as directed.  Will obtain labs at next visit in 1 week.  COPD (chronic obstructive pulmonary disease) Referral to Pulmonology for PFTs and lung mass placed.  Hypothyroidism Medications refilled.  Will repeat TSH at next visit.   Malignant neoplasm of skin of parts of face No concerning lesion at present.  Referral placed to dermatology for yearly check up.  Depression Medications refilled.  Referral placed to psychiatry for adult ADD symptoms per patient's request.  Lung mass PET scan has been ordered.  Referral placed to pulmonology.

## 2013-07-19 NOTE — Assessment & Plan Note (Signed)
Referral to Pulmonology for PFTs and lung mass placed.

## 2013-07-19 NOTE — Assessment & Plan Note (Signed)
Medications refilled.  Will repeat TSH at next visit.

## 2013-07-19 NOTE — Progress Notes (Signed)
Pre visit review using our clinic review tool, if applicable. No additional management support is needed unless otherwise documented below in the visit note/SLS  

## 2013-07-19 NOTE — Assessment & Plan Note (Signed)
PET scan has been ordered.  Referral placed to pulmonology.

## 2013-07-19 NOTE — Patient Instructions (Signed)
Please continue medications as directed.  Continue Valium PRN.  This will work for anxiety but will also help with muscle spasm.  I do not feel you should be on both the Valium and Flexeril giving your age, health issues and other medications.  For ADD -- my advice would be to let me set you up with psychiatry to find an appropriate regimen as Ritalin and Vyvvanse.  Non-stimulant may interact with antidepressant/anxiety medications. You will be contacted for PET scan and to see a pulmonologist.    Please return to clinic for Medicare Wellness exam. Come fasting for labs.

## 2013-07-19 NOTE — Assessment & Plan Note (Signed)
Asymptomatic.  Medications refilled.  Take as directed.  Will obtain labs at next visit in 1 week.

## 2013-07-19 NOTE — Assessment & Plan Note (Signed)
Medications refilled.  Referral placed to psychiatry for adult ADD symptoms per patient's request.

## 2013-07-19 NOTE — Assessment & Plan Note (Signed)
No concerning lesion at present.  Referral placed to dermatology for yearly check up.

## 2013-07-24 ENCOUNTER — Ambulatory Visit: Payer: Medicare Other | Admitting: Family

## 2013-07-26 ENCOUNTER — Other Ambulatory Visit: Payer: Self-pay | Admitting: Orthopedic Surgery

## 2013-07-27 ENCOUNTER — Institutional Professional Consult (permissible substitution): Payer: No Typology Code available for payment source | Admitting: Pulmonary Disease

## 2013-07-31 ENCOUNTER — Telehealth: Payer: Self-pay

## 2013-07-31 ENCOUNTER — Encounter (HOSPITAL_BASED_OUTPATIENT_CLINIC_OR_DEPARTMENT_OTHER): Payer: Self-pay | Admitting: *Deleted

## 2013-07-31 NOTE — Telephone Encounter (Signed)
Griffin Basil with [facility] pre-[facility] left a message stating that patient is establishing with [facility] tomorrow and she would like [facility] to get an EKG and basic metabolic panel so patient doesn't have to go in there.  Edwena Felty stated that nothing needs to be sent to her she will see it in [facility]

## 2013-07-31 NOTE — Telephone Encounter (Signed)
Patient was new patient and seen on 07/19/13 for acute for medication refills. Was to return to formally establish and for fasting labs/medicare wellness.  Patient had a few appointments scheduled but canceled them.  Was supposed to have appointment on 08/02/13 but I see she has canceled. Please make Edwena Felty aware of this if we have their number. Please call patient to make her aware that she needs labs and EKG, whether she wants to get them from [place] or from the pre-operative center.

## 2013-07-31 NOTE — Progress Notes (Signed)
Pt seeing pcp in [place] tomorrow for labs-have called to see if they can do her ekg and bmet for preop so she does not need to come in here To bring all meds dos-

## 2013-08-01 ENCOUNTER — Ambulatory Visit (HOSPITAL_COMMUNITY): Payer: Medicare Other

## 2013-08-01 NOTE — Telephone Encounter (Signed)
Spoke with patient who stated adamantly that she did not call and cancel 05.21.15 appt scheduled at 10:30am, as she was aware of necessity prior to Sx; appt showed canceled on 05.19.15 at 7:35a by front desk & stated that "we had better move someone else because she would be here for her appt 05.21.15 at 10:30a"; had supervisor run report and it showed that appointment was canceled by patient during automated 'phone tree' reminder call, this happens if patient selects Option #2 at end of message. Called pt back and LMOM to inform her of this information and also that we would see her at 10:30am tomorrow as originally scheduled [NP Transfer/CPE canceled]/SLS

## 2013-08-02 ENCOUNTER — Ambulatory Visit (INDEPENDENT_AMBULATORY_CARE_PROVIDER_SITE_OTHER): Payer: Medicare Other | Admitting: Physician Assistant

## 2013-08-02 ENCOUNTER — Ambulatory Visit: Payer: Medicare Other | Admitting: Physician Assistant

## 2013-08-02 ENCOUNTER — Encounter: Payer: Self-pay | Admitting: Physician Assistant

## 2013-08-02 VITALS — BP 128/64 | HR 71 | Temp 98.2°F | Resp 14 | Ht 63.0 in | Wt 112.2 lb

## 2013-08-02 DIAGNOSIS — E039 Hypothyroidism, unspecified: Secondary | ICD-10-CM

## 2013-08-02 DIAGNOSIS — R5383 Other fatigue: Secondary | ICD-10-CM

## 2013-08-02 DIAGNOSIS — R9431 Abnormal electrocardiogram [ECG] [EKG]: Secondary | ICD-10-CM | POA: Diagnosis not present

## 2013-08-02 DIAGNOSIS — I10 Essential (primary) hypertension: Secondary | ICD-10-CM

## 2013-08-02 DIAGNOSIS — E785 Hyperlipidemia, unspecified: Secondary | ICD-10-CM

## 2013-08-02 DIAGNOSIS — F329 Major depressive disorder, single episode, unspecified: Secondary | ICD-10-CM

## 2013-08-02 DIAGNOSIS — R5381 Other malaise: Secondary | ICD-10-CM

## 2013-08-02 DIAGNOSIS — Z85828 Personal history of other malignant neoplasm of skin: Secondary | ICD-10-CM

## 2013-08-02 DIAGNOSIS — Z0000 Encounter for general adult medical examination without abnormal findings: Secondary | ICD-10-CM | POA: Diagnosis not present

## 2013-08-02 DIAGNOSIS — F3289 Other specified depressive episodes: Secondary | ICD-10-CM

## 2013-08-02 DIAGNOSIS — Z136 Encounter for screening for cardiovascular disorders: Secondary | ICD-10-CM | POA: Diagnosis not present

## 2013-08-02 DIAGNOSIS — F32A Depression, unspecified: Secondary | ICD-10-CM

## 2013-08-02 DIAGNOSIS — Z13820 Encounter for screening for osteoporosis: Secondary | ICD-10-CM

## 2013-08-02 LAB — CBC WITH DIFFERENTIAL/PLATELET
BASOS PCT: 1 % (ref 0–1)
Basophils Absolute: 0.1 10*3/uL (ref 0.0–0.1)
EOS ABS: 0.2 10*3/uL (ref 0.0–0.7)
Eosinophils Relative: 3 % (ref 0–5)
HCT: 39.1 % (ref 36.0–46.0)
Hemoglobin: 13.3 g/dL (ref 12.0–15.0)
Lymphocytes Relative: 39 % (ref 12–46)
Lymphs Abs: 2.1 10*3/uL (ref 0.7–4.0)
MCH: 32.4 pg (ref 26.0–34.0)
MCHC: 34 g/dL (ref 30.0–36.0)
MCV: 95.1 fL (ref 78.0–100.0)
Monocytes Absolute: 0.5 10*3/uL (ref 0.1–1.0)
Monocytes Relative: 9 % (ref 3–12)
NEUTROS PCT: 48 % (ref 43–77)
Neutro Abs: 2.6 10*3/uL (ref 1.7–7.7)
Platelets: 212 10*3/uL (ref 150–400)
RBC: 4.11 MIL/uL (ref 3.87–5.11)
RDW: 13.4 % (ref 11.5–15.5)
WBC: 5.4 10*3/uL (ref 4.0–10.5)

## 2013-08-02 LAB — LIPID PANEL
CHOLESTEROL: 218 mg/dL — AB (ref 0–200)
HDL: 74 mg/dL (ref >39)
LDL CALC: 124 mg/dL — AB (ref 0–99)
Total CHOL/HDL Ratio: 2.9 Ratio
Triglycerides: 98 mg/dL (ref <150)
VLDL: 20 mg/dL (ref 0–40)

## 2013-08-02 LAB — COMPLETE METABOLIC PANEL WITH GFR
ALT: 10 U/L (ref 0–35)
AST: 14 U/L (ref 0–37)
Albumin: 4.4 g/dL (ref 3.5–5.2)
Alkaline Phosphatase: 70 U/L (ref 39–117)
BILIRUBIN TOTAL: 0.5 mg/dL (ref 0.2–1.2)
BUN: 15 mg/dL (ref 6–23)
CO2: 31 mEq/L (ref 19–32)
Calcium: 10.3 mg/dL (ref 8.4–10.5)
Chloride: 103 mEq/L (ref 96–112)
Creat: 0.99 mg/dL (ref 0.50–1.10)
GFR, Est African American: 65 mL/min
GFR, Est Non African American: 56 mL/min — ABNORMAL LOW
Glucose, Bld: 97 mg/dL (ref 70–99)
Potassium: 4.1 mEq/L (ref 3.5–5.3)
SODIUM: 140 meq/L (ref 135–145)
TOTAL PROTEIN: 7.1 g/dL (ref 6.0–8.3)

## 2013-08-02 LAB — TSH: TSH: 0.69 u[IU]/mL (ref 0.350–4.500)

## 2013-08-02 NOTE — Patient Instructions (Signed)
Please obtain labs.  I will call you with your results.  Please continue medications as directed.  You will be contacted by Pulmonology and Dermatology.  Please go to the PET scan appointment.  You will also be contacted for bone density scan to assess for osteoporosis.  We will schedule follow-up based on lab results.  Hypertension As your heart beats, it forces blood through your arteries. This force is your blood pressure. If the pressure is too high, it is called hypertension (HTN) or high blood pressure. HTN is dangerous because you may have it and not know it. High blood pressure may mean that your heart has to work harder to pump blood. Your arteries may be narrow or stiff. The extra work puts you at risk for heart disease, stroke, and other problems.  Blood pressure consists of two numbers, a higher number over a lower, 110/72, for example. It is stated as "110 over 72." The ideal is below 120 for the top number (systolic) and under 80 for the bottom (diastolic). Write down your blood pressure today. You should pay close attention to your blood pressure if you have certain conditions such as:  Heart failure.  Prior heart attack.  Diabetes  Chronic kidney disease.  Prior stroke.  Multiple risk factors for heart disease. To see if you have HTN, your blood pressure should be measured while you are seated with your arm held at the level of the heart. It should be measured at least twice. A one-time elevated blood pressure reading (especially in the Emergency Department) does not mean that you need treatment. There may be conditions in which the blood pressure is different between your right and left arms. It is important to see your caregiver soon for a recheck. Most people have essential hypertension which means that there is not a specific cause. This type of high blood pressure may be lowered by changing lifestyle factors such as:  Stress.  Smoking.  Lack of exercise.  Excessive  weight.  Drug/tobacco/alcohol use.  Eating less salt. Most people do not have symptoms from high blood pressure until it has caused damage to the body. Effective treatment can often prevent, delay or reduce that damage. TREATMENT  When a cause has been identified, treatment for high blood pressure is directed at the cause. There are a large number of medications to treat HTN. These fall into several categories, and your caregiver will help you select the medicines that are best for you. Medications may have side effects. You should review side effects with your caregiver. If your blood pressure stays high after you have made lifestyle changes or started on medicines,   Your medication(s) may need to be changed.  Other problems may need to be addressed.  Be certain you understand your prescriptions, and know how and when to take your medicine.  Be sure to follow up with your caregiver within the time frame advised (usually within two weeks) to have your blood pressure rechecked and to review your medications.  If you are taking more than one medicine to lower your blood pressure, make sure you know how and at what times they should be taken. Taking two medicines at the same time can result in blood pressure that is too low. SEEK IMMEDIATE MEDICAL CARE IF:  You develop a severe headache, blurred or changing vision, or confusion.  You have unusual weakness or numbness, or a faint feeling.  You have severe chest or abdominal pain, vomiting, or breathing problems. MAKE  SURE YOU:   Understand these instructions.  Will watch your condition.  Will get help right away if you are not doing well or get worse. Document Released: 03/01/2005 Document Revised: 05/24/2011 Document Reviewed: 10/20/2007 [facility] Patient Information 2014 [place].

## 2013-08-02 NOTE — Assessment & Plan Note (Addendum)
Initial EKG obtained showed NSR but evience of old anterior infarct.  Repositioning of the leads produced EKG with NSR and no abnormalities.

## 2013-08-02 NOTE — Assessment & Plan Note (Signed)
During the course of the visit the patient was educated and counseled about appropriate screening and preventive services including: Fall prevention, Screening mammography, Bone densitometry screening, Diabetes screening and Nutrition counseling.  Patient up-to-date on tetanus.  Refuses Prevnar and Zostavax.  Will obtain fasting labs.  EKG obtained -- see #7.  Patient followed by OG/GYN for gynecological care and mammography.

## 2013-08-02 NOTE — Assessment & Plan Note (Signed)
Will repeat TSH

## 2013-08-02 NOTE — Assessment & Plan Note (Signed)
Well-controlled at present. Continue current regimen. 

## 2013-08-02 NOTE — Progress Notes (Signed)
Pre visit review using our clinic review tool, if applicable. No additional management support is needed unless otherwise documented below in the visit note/SLS  

## 2013-08-02 NOTE — Assessment & Plan Note (Signed)
Order placed for DEXA

## 2013-08-02 NOTE — Progress Notes (Signed)
Subjective:   Patient here for Medicare annual wellness visit and management of other chronic and acute problems.  Patient also needing labs and EKG done that were ordered by the Preoperative Clinic -- Patient has upcoming Surgery on R thumb 2/2 arthritis.  Patient still waiting on PET Scan to evaluate Pulmonary mass.  Has scheduled for 6/2 with Pulmonary follow-up later that day. See note from last week for further detail.   Hypertension -- hx of RAS s/p stenting. BP controlled with Amlodipine and losartan.  Patient takes baby aspirin daily.  Is due for lipid panel to assess for hyperlipidemia.  Depression -- endorses well controlled with current regimen.  Denies SI/HI or depressed mood.   Hypothyroidism -- due for repeat TSH.       Risk factors: Hypertension, Carotid Artery Disease, Hypothyroidism.   Roster of Physicians Providing Medical Care to Patient: Brunetta Jeans, PA-C -- Family Medicine Dr. Lamonte Sakai -- Pulmonology Dr. Sena Hitch -- Orthopedic Surgery Dr. Raynelle Dick -- Urology  Dr. Radford Pax -- Previous Dermatologist.  Needs new referral.  Activities of Daily Living:  In your present state of health, do you have any difficulty performing the following activities? Preparing food and eating?: No  Bathing yourself: No  Getting dressed: No  Using the toilet:No  Moving around from place to place: No  In the past year have you fallen or had a near fall?:No     Home Safety: Has smoke detector and wears seat belts. No firearms. No excess sun exposure.  Diet and Exercise  Current exercise habits: Walking Dietary issues discussed: Healthy diet, well-balanced.  Drinks mostly skim milk.  Depression Screen  (Note: if answer to either of the following is "Yes", then a more complete depression screening is indicated)   Q1: Over the past two weeks, have you felt down, depressed or hopeless? no  Q2: Over the past two weeks, have you felt little interest or pleasure in doing things? no   The  following portions of the patient's history were reviewed and updated as appropriate: allergies, current medications, past family history, past medical history, past social history, past surgical history and problem list.   Review of Systems  Constitutional: Negative for fever and weight loss.  HENT: Negative for ear discharge, ear pain, hearing loss and tinnitus.   Eyes: Negative for blurred vision, double vision, photophobia and pain.  Respiratory: Negative for cough.   Cardiovascular: Negative for chest pain, palpitations, claudication, leg swelling and PND.  Gastrointestinal: Negative for heartburn, nausea, vomiting, abdominal pain, diarrhea, constipation, blood in stool and melena.  Genitourinary: Negative for dysuria, urgency, frequency, hematuria and flank pain.  Neurological: Negative for dizziness, seizures, loss of consciousness and headaches.  Psychiatric/Behavioral: Negative for depression, suicidal ideas, hallucinations and substance abuse. The patient is not nervous/anxious.    Objective:   Vision:  Hearing: Patient can hear a forced whisper from across the room. Body mass index: Body mass index is 19.89 kg/(m^2). Cognitive Impairment Assessment: cognition, memory and judgment appear normal.   Physical Exam  Vitals reviewed. Constitutional: She is oriented to person, place, and time and well-developed, well-nourished, and in no distress.  Cardiovascular: Normal rate, regular rhythm, normal heart sounds and intact distal pulses.   Pulmonary/Chest: Effort normal and breath sounds normal. No respiratory distress. She has no wheezes. She has no rales. She exhibits no tenderness.  Musculoskeletal: Normal range of motion.  Neurological: She is alert and oriented to person, place, and time.  Skin: Skin is warm and dry. No  rash noted.  Psychiatric: Affect normal.   Assessment:   (1) Medicare wellness utd on preventive parameters  (2) Hypertension (3) Depression  (4) Pulmonary  Mass (5) Hypothyroidism (6) Osteoporosis Screening (7) Screening for Ischemic Heat Disease  Plan:   (1) During the course of the visit the patient was educated and counseled about appropriate screening and preventive services including: Fall prevention, Screening mammography, Bone densitometry screening, Diabetes screening and Nutrition counseling.  Patient up-to-date on tetanus.  Refuses Prevnar and Zostavax.  Will obtain fasting labs.  EKG obtained -- see #7.  Patient followed by OG/GYN for gynecological care and mammography.  (2) Well controlled.  Recheck renal function and electrolytes.  Will obtain fasting lipid panel.  (3) Well controlled at present.  Continue current regimen.  (4) PET scan upcoming on 6/2.  Pulmonary consult on that same day.  (5) Will repeat TSH.   (6) Order placed for DEXA.   (7) Initial EKG obtained showed NSR but evience of old anterior infarct.  Repositioning of the leads shows NSR and no abnormalities.  Patient Instructions (the written plan) was given to the patient.

## 2013-08-02 NOTE — Assessment & Plan Note (Signed)
Well controlled.  Recheck renal function and electrolytes.  Will obtain fasting lipid panel.

## 2013-08-03 ENCOUNTER — Encounter (HOSPITAL_BASED_OUTPATIENT_CLINIC_OR_DEPARTMENT_OTHER): Payer: Self-pay | Admitting: *Deleted

## 2013-08-03 LAB — URINALYSIS, ROUTINE W REFLEX MICROSCOPIC
BILIRUBIN URINE: NEGATIVE
Glucose, UA: NEGATIVE mg/dL
HGB URINE DIPSTICK: NEGATIVE
KETONES UR: NEGATIVE mg/dL
Leukocytes, UA: NEGATIVE
NITRITE: NEGATIVE
PH: 6.5 (ref 5.0–8.0)
Protein, ur: NEGATIVE mg/dL
Specific Gravity, Urine: 1.018 (ref 1.005–1.030)
Urobilinogen, UA: 0.2 mg/dL (ref 0.0–1.0)

## 2013-08-03 NOTE — Progress Notes (Signed)
Spoke with Dr Joslin pt cleared for surgery 

## 2013-08-04 ENCOUNTER — Encounter: Payer: Self-pay | Admitting: Physician Assistant

## 2013-08-07 ENCOUNTER — Ambulatory Visit (HOSPITAL_BASED_OUTPATIENT_CLINIC_OR_DEPARTMENT_OTHER)
Admission: RE | Admit: 2013-08-07 | Discharge: 2013-08-07 | Disposition: A | Discharge status: Home / Self Care | Payer: Medicare Other | Source: Ambulatory Visit | Attending: Orthopedic Surgery | Admitting: Orthopedic Surgery

## 2013-08-07 ENCOUNTER — Encounter (HOSPITAL_BASED_OUTPATIENT_CLINIC_OR_DEPARTMENT_OTHER): Payer: Self-pay | Admitting: Certified Registered"

## 2013-08-07 ENCOUNTER — Encounter (HOSPITAL_BASED_OUTPATIENT_CLINIC_OR_DEPARTMENT_OTHER)
Admission: RE | Disposition: A | Discharge status: Home / Self Care | Payer: Self-pay | Source: Ambulatory Visit | Attending: Orthopedic Surgery

## 2013-08-07 ENCOUNTER — Ambulatory Visit (HOSPITAL_BASED_OUTPATIENT_CLINIC_OR_DEPARTMENT_OTHER): Payer: Medicare Other | Admitting: Certified Registered"

## 2013-08-07 ENCOUNTER — Encounter (HOSPITAL_BASED_OUTPATIENT_CLINIC_OR_DEPARTMENT_OTHER): Payer: Medicare Other | Admitting: Certified Registered"

## 2013-08-07 DIAGNOSIS — I10 Essential (primary) hypertension: Secondary | ICD-10-CM | POA: Insufficient documentation

## 2013-08-07 DIAGNOSIS — F329 Major depressive disorder, single episode, unspecified: Secondary | ICD-10-CM | POA: Insufficient documentation

## 2013-08-07 DIAGNOSIS — Z880 Allergy status to penicillin: Secondary | ICD-10-CM | POA: Insufficient documentation

## 2013-08-07 DIAGNOSIS — K219 Gastro-esophageal reflux disease without esophagitis: Secondary | ICD-10-CM | POA: Insufficient documentation

## 2013-08-07 DIAGNOSIS — J42 Unspecified chronic bronchitis: Secondary | ICD-10-CM | POA: Insufficient documentation

## 2013-08-07 DIAGNOSIS — E039 Hypothyroidism, unspecified: Secondary | ICD-10-CM | POA: Diagnosis not present

## 2013-08-07 DIAGNOSIS — J438 Other emphysema: Secondary | ICD-10-CM | POA: Diagnosis not present

## 2013-08-07 DIAGNOSIS — G609 Hereditary and idiopathic neuropathy, unspecified: Secondary | ICD-10-CM | POA: Insufficient documentation

## 2013-08-07 DIAGNOSIS — M19049 Primary osteoarthritis, unspecified hand: Secondary | ICD-10-CM | POA: Insufficient documentation

## 2013-08-07 DIAGNOSIS — Z885 Allergy status to narcotic agent status: Secondary | ICD-10-CM | POA: Insufficient documentation

## 2013-08-07 DIAGNOSIS — Z87891 Personal history of nicotine dependence: Secondary | ICD-10-CM | POA: Insufficient documentation

## 2013-08-07 DIAGNOSIS — I6529 Occlusion and stenosis of unspecified carotid artery: Secondary | ICD-10-CM | POA: Insufficient documentation

## 2013-08-07 DIAGNOSIS — G8918 Other acute postprocedural pain: Secondary | ICD-10-CM | POA: Diagnosis not present

## 2013-08-07 DIAGNOSIS — F1020 Alcohol dependence, uncomplicated: Secondary | ICD-10-CM | POA: Insufficient documentation

## 2013-08-07 DIAGNOSIS — Z96649 Presence of unspecified artificial hip joint: Secondary | ICD-10-CM | POA: Insufficient documentation

## 2013-08-07 DIAGNOSIS — Z79899 Other long term (current) drug therapy: Secondary | ICD-10-CM | POA: Insufficient documentation

## 2013-08-07 DIAGNOSIS — M25549 Pain in joints of unspecified hand: Secondary | ICD-10-CM | POA: Diagnosis not present

## 2013-08-07 DIAGNOSIS — F3289 Other specified depressive episodes: Secondary | ICD-10-CM | POA: Insufficient documentation

## 2013-08-07 HISTORY — PX: CARPOMETACARPEL SUSPENSION PLASTY: SHX5005

## 2013-08-07 HISTORY — DX: Reserved for concepts with insufficient information to code with codable children: IMO0002

## 2013-08-07 HISTORY — DX: Presence of spectacles and contact lenses: Z97.3

## 2013-08-07 LAB — POCT HEMOGLOBIN-HEMACUE: Hemoglobin: 13 g/dL (ref 12.0–15.0)

## 2013-08-07 SURGERY — CARPOMETACARPEL (CMC) SUSPENSION PLASTY
Anesthesia: General | Site: Thumb | Laterality: Right

## 2013-08-07 MED ORDER — GLYCOPYRROLATE 0.2 MG/ML IJ SOLN
INTRAMUSCULAR | Status: DC | PRN
  Administered 2013-08-07: 0.1 mg via INTRAVENOUS

## 2013-08-07 MED ORDER — PROPOFOL 10 MG/ML IV BOLUS
INTRAVENOUS | Status: DC | PRN
  Administered 2013-08-07: 150 mg via INTRAVENOUS

## 2013-08-07 MED ORDER — VANCOMYCIN HCL IN DEXTROSE 1-5 GM/200ML-% IV SOLN
1000.0000 mg | INTRAVENOUS | Status: AC
  Administered 2013-08-07: 1000 mg via INTRAVENOUS

## 2013-08-07 MED ORDER — 0.9 % SODIUM CHLORIDE (POUR BTL) OPTIME
TOPICAL | Status: DC | PRN
  Administered 2013-08-07: 100 mL

## 2013-08-07 MED ORDER — VANCOMYCIN HCL IN DEXTROSE 1-5 GM/200ML-% IV SOLN
INTRAVENOUS | Status: AC
  Filled 2013-08-07: qty 200

## 2013-08-07 MED ORDER — LIDOCAINE HCL (PF) 1 % IJ SOLN
INTRAMUSCULAR | Status: AC
  Filled 2013-08-07: qty 5

## 2013-08-07 MED ORDER — BUPIVACAINE-EPINEPHRINE (PF) 0.5% -1:200000 IJ SOLN
INTRAMUSCULAR | Status: DC | PRN
  Administered 2013-08-07: 23 mL

## 2013-08-07 MED ORDER — OXYCODONE-ACETAMINOPHEN 5-325 MG PO TABS: 1.0000 | ORAL_TABLET | ORAL | Status: DC | PRN

## 2013-08-07 MED ORDER — PROPOFOL 10 MG/ML IV EMUL
INTRAVENOUS | Status: AC
  Filled 2013-08-07: qty 150

## 2013-08-07 MED ORDER — LIDOCAINE HCL (PF) 1 % IJ SOLN
INTRAMUSCULAR | Status: AC
  Filled 2013-08-07: qty 30

## 2013-08-07 MED ORDER — MIDAZOLAM HCL 2 MG/2ML IJ SOLN
INTRAMUSCULAR | Status: AC
  Filled 2013-08-07: qty 2

## 2013-08-07 MED ORDER — VANCOMYCIN HCL IN DEXTROSE 1-5 GM/200ML-% IV SOLN: 1000.0000 mg | INTRAVENOUS | Status: DC

## 2013-08-07 MED ORDER — FENTANYL CITRATE 0.05 MG/ML IJ SOLN
50.0000 ug | INTRAMUSCULAR | Status: DC | PRN
  Administered 2013-08-07: 50 ug via INTRAVENOUS

## 2013-08-07 MED ORDER — EPHEDRINE SULFATE 50 MG/ML IJ SOLN
INTRAMUSCULAR | Status: DC | PRN
  Administered 2013-08-07 (×3): 10 mg via INTRAVENOUS

## 2013-08-07 MED ORDER — ONDANSETRON HCL 4 MG/2ML IJ SOLN: 4.0000 mg | Freq: Once | INTRAMUSCULAR | Status: DC | PRN

## 2013-08-07 MED ORDER — CHLORHEXIDINE GLUCONATE 4 % EX LIQD: 60.0000 mL | Freq: Once | CUTANEOUS | Status: DC

## 2013-08-07 MED ORDER — DEXAMETHASONE SODIUM PHOSPHATE 10 MG/ML IJ SOLN
INTRAMUSCULAR | Status: DC | PRN
  Administered 2013-08-07: 4 mg via INTRAVENOUS

## 2013-08-07 MED ORDER — BUPIVACAINE HCL (PF) 0.25 % IJ SOLN
INTRAMUSCULAR | Status: AC
  Filled 2013-08-07: qty 30

## 2013-08-07 MED ORDER — LIDOCAINE HCL (CARDIAC) 20 MG/ML IV SOLN
INTRAVENOUS | Status: DC | PRN
  Administered 2013-08-07: 30 mg via INTRAVENOUS

## 2013-08-07 MED ORDER — FENTANYL CITRATE 0.05 MG/ML IJ SOLN
INTRAMUSCULAR | Status: AC
  Filled 2013-08-07: qty 2

## 2013-08-07 MED ORDER — FENTANYL CITRATE 0.05 MG/ML IJ SOLN
INTRAMUSCULAR | Status: AC
  Filled 2013-08-07: qty 6

## 2013-08-07 MED ORDER — FENTANYL CITRATE 0.05 MG/ML IJ SOLN: 25.0000 ug | INTRAMUSCULAR | Status: DC | PRN

## 2013-08-07 MED ORDER — ONDANSETRON HCL 4 MG/2ML IJ SOLN
INTRAMUSCULAR | Status: DC | PRN
  Administered 2013-08-07: 4 mg via INTRAVENOUS

## 2013-08-07 MED ORDER — MIDAZOLAM HCL 2 MG/2ML IJ SOLN
1.0000 mg | INTRAMUSCULAR | Status: DC | PRN
  Administered 2013-08-07: 2 mg via INTRAVENOUS

## 2013-08-07 MED ORDER — LACTATED RINGERS IV SOLN
INTRAVENOUS | Status: DC
  Administered 2013-08-07 (×2): via INTRAVENOUS

## 2013-08-07 SURGICAL SUPPLY — 73 items
BLADE ARTHRO LOK 4 BEAVER (BLADE) IMPLANT
BLADE MINI RND TIP GREEN BEAV (BLADE) ×2 IMPLANT
BLADE SURG 15 STRL LF DISP TIS (BLADE) ×1 IMPLANT
BLADE SURG 15 STRL SS (BLADE) ×2
BNDG CMPR 9X4 STRL LF SNTH (GAUZE/BANDAGES/DRESSINGS) ×1
BNDG COHESIVE 3X5 TAN STRL LF (GAUZE/BANDAGES/DRESSINGS) ×2 IMPLANT
BNDG ESMARK 4X9 LF (GAUZE/BANDAGES/DRESSINGS) ×2 IMPLANT
BNDG GAUZE ELAST 4 BULKY (GAUZE/BANDAGES/DRESSINGS) ×2 IMPLANT
BUR EGG 3PK/BX (BURR) IMPLANT
CHLORAPREP W/TINT 26ML (MISCELLANEOUS) ×2 IMPLANT
CORDS BIPOLAR (ELECTRODE) ×2 IMPLANT
COVER MAYO STAND STRL (DRAPES) ×1 IMPLANT
COVER TABLE BACK 60X90 (DRAPES) ×2 IMPLANT
CUFF TOURNIQUET SINGLE 18IN (TOURNIQUET CUFF) ×2 IMPLANT
DECANTER SPIKE VIAL GLASS SM (MISCELLANEOUS) IMPLANT
DRAPE EXTREMITY T 121X128X90 (DRAPE) ×2 IMPLANT
DRAPE OEC MINIVIEW 54X84 (DRAPES) ×2 IMPLANT
DRAPE SURG 17X23 STRL (DRAPES) ×2 IMPLANT
GAUZE SPONGE 4X4 12PLY STRL (GAUZE/BANDAGES/DRESSINGS) ×2 IMPLANT
GAUZE SPONGE 4X4 16PLY XRAY LF (GAUZE/BANDAGES/DRESSINGS) IMPLANT
GAUZE XEROFORM 1X8 LF (GAUZE/BANDAGES/DRESSINGS) ×2 IMPLANT
GLOVE BIO SURGEON STRL SZ7.5 (GLOVE) ×2 IMPLANT
GLOVE BIOGEL PI IND STRL 7.0 (GLOVE) IMPLANT
GLOVE BIOGEL PI IND STRL 7.5 (GLOVE) ×1 IMPLANT
GLOVE BIOGEL PI IND STRL 8.5 (GLOVE) ×1 IMPLANT
GLOVE BIOGEL PI INDICATOR 7.0 (GLOVE) ×1
GLOVE BIOGEL PI INDICATOR 7.5 (GLOVE) ×1
GLOVE BIOGEL PI INDICATOR 8.5 (GLOVE) ×1
GLOVE ECLIPSE 7.0 STRL STRAW (GLOVE) ×1 IMPLANT
GLOVE EXAM NITRILE LRG STRL (GLOVE) ×1 IMPLANT
GLOVE SURG ORTHO 8.0 STRL STRW (GLOVE) ×2 IMPLANT
GOWN STRL REUS W/ TWL LRG LVL3 (GOWN DISPOSABLE) ×1 IMPLANT
GOWN STRL REUS W/TWL LRG LVL3 (GOWN DISPOSABLE) ×4
GOWN STRL REUS W/TWL XL LVL3 (GOWN DISPOSABLE) ×2 IMPLANT
NDL KEITH (NEEDLE) IMPLANT
NDL SUT 6 .5 CRC .975X.05 MAYO (NEEDLE) IMPLANT
NEEDLE 27GAX1X1/2 (NEEDLE) IMPLANT
NEEDLE KEITH (NEEDLE) IMPLANT
NEEDLE MAYO TAPER (NEEDLE)
NS IRRIG 1000ML POUR BTL (IV SOLUTION) ×2 IMPLANT
PACK BASIN DAY SURGERY FS (CUSTOM PROCEDURE TRAY) ×2 IMPLANT
PAD CAST 3X4 CTTN HI CHSV (CAST SUPPLIES) ×1 IMPLANT
PADDING CAST ABS 3INX4YD NS (CAST SUPPLIES) ×1
PADDING CAST ABS 4INX4YD NS (CAST SUPPLIES)
PADDING CAST ABS COTTON 3X4 (CAST SUPPLIES) ×1 IMPLANT
PADDING CAST ABS COTTON 4X4 ST (CAST SUPPLIES) IMPLANT
PADDING CAST COTTON 3X4 STRL (CAST SUPPLIES) ×2
RUBBERBAND STERILE (MISCELLANEOUS) IMPLANT
SLEEVE SCD COMPRESS KNEE MED (MISCELLANEOUS) ×2 IMPLANT
SPLINT PLASTER CAST XFAST 3X15 (CAST SUPPLIES) ×1 IMPLANT
SPLINT PLASTER XTRA FASTSET 3X (CAST SUPPLIES) ×1
STOCKINETTE 4X48 STRL (DRAPES) ×2 IMPLANT
STRIP CLOSURE SKIN 1/4X4 (GAUZE/BANDAGES/DRESSINGS) IMPLANT
SUT ETHIBOND 2 OS 4 DA (SUTURE) IMPLANT
SUT ETHIBOND 3-0 V-5 (SUTURE) IMPLANT
SUT FIBERWIRE #2 38 T-5 BLUE (SUTURE)
SUT FIBERWIRE 2-0 18 17.9 3/8 (SUTURE)
SUT FIBERWIRE 4-0 18 DIAM BLUE (SUTURE) ×2
SUT MERSILENE 4 0 P 3 (SUTURE) IMPLANT
SUT STEEL 3 0 (SUTURE) IMPLANT
SUT STEEL 4 0 (SUTURE) ×1 IMPLANT
SUT VIC AB 4-0 P-3 18XBRD (SUTURE) IMPLANT
SUT VIC AB 4-0 P2 18 (SUTURE) ×1 IMPLANT
SUT VIC AB 4-0 P3 18 (SUTURE)
SUT VICRYL RAPID 5 0 P 3 (SUTURE) IMPLANT
SUT VICRYL RAPIDE 4/0 PS 2 (SUTURE) ×3 IMPLANT
SUTURE FIBERWR #2 38 T-5 BLUE (SUTURE) IMPLANT
SUTURE FIBERWR 2-0 18 17.9 3/8 (SUTURE) IMPLANT
SUTURE FIBERWR 4-0 18 DIA BLUE (SUTURE) ×1 IMPLANT
SYR BULB 3OZ (MISCELLANEOUS) ×2 IMPLANT
SYR CONTROL 10ML LL (SYRINGE) IMPLANT
TOWEL OR 17X24 6PK STRL BLUE (TOWEL DISPOSABLE) ×4 IMPLANT
UNDERPAD 30X30 INCONTINENT (UNDERPADS AND DIAPERS) IMPLANT

## 2013-08-07 NOTE — Anesthesia Procedure Notes (Addendum)
Anesthesia Regional Block:  Supraclavicular block  Pre-Anesthetic Checklist: ,, timeout performed, Correct Patient, Correct Site, Correct Laterality, Correct Procedure, Correct Position, site marked, Risks and benefits discussed,  Surgical consent,  Pre-op evaluation,  At surgeon's request and post-op pain management  Laterality: Left and Upper  Prep: chloraprep       Needles:  Injection technique: Single-shot  Needle Type: Echogenic Stimulator Needle     Needle Length: 5cm 5 cm Needle Gauge: 21 and 21 G    Additional Needles:  Procedures: ultrasound guided (picture in chart) Supraclavicular block Narrative:  Start time: 08/07/2013 8:05 AM End time: 08/07/2013 8:13 AM Injection made incrementally with aspirations every 5 mL.  Performed by: Personally  Anesthesiologist: Lorrene Reid   Procedure Name: LMA Insertion Date/Time: 08/07/2013 8:41 AM Performed by: Keanu Lesniak Pre-anesthesia Checklist: Patient identified, Emergency Drugs available, Suction available and Patient being monitored Patient Re-evaluated:Patient Re-evaluated prior to inductionOxygen Delivery Method: Circle System Utilized Preoxygenation: Pre-oxygenation with 100% oxygen Intubation Type: IV induction Ventilation: Mask ventilation without difficulty LMA: LMA inserted LMA Size: 4.0 Number of attempts: 1 Airway Equipment and Method: bite block Placement Confirmation: positive ETCO2 Tube secured with: Tape Dental Injury: Teeth and Oropharynx as per pre-operative assessment

## 2013-08-07 NOTE — Transfer of Care (Signed)
Immediate Anesthesia Transfer of Care Note  Patient: Rose [name]  Procedure(s) Performed: Procedure(s): CARPOMETACARPEL ([place]) SUSPENSION PLASTY RIGHT THUMB (Right)  Patient Location: PACU  Anesthesia Type:GA combined with regional for post-op pain  Level of Consciousness: awake and patient cooperative  Airway & Oxygen Therapy: Patient Spontanous Breathing and Patient connected to face mask oxygen  Post-op Assessment: Report given to PACU RN and Post -op Vital signs reviewed and stable  Post vital signs: Reviewed and stable  Complications: No apparent anesthesia complications

## 2013-08-07 NOTE — Discharge Instructions (Addendum)
Post Anesthesia Home Care Instructions  Activity: Get plenty of rest for the remainder of the day. A responsible adult should stay with you for 24 hours following the procedure.  For the next 24 hours, DO NOT: -Drive a car -Paediatric nurse -Drink alcoholic beverages -Take any medication unless instructed by your physician -Make any legal decisions or sign important papers.  Meals: Start with liquid foods such as gelatin or soup. Progress to regular foods as tolerated. Avoid greasy, spicy, heavy foods. If nausea and/or vomiting occur, drink only clear liquids until the nausea and/or vomiting subsides. Call your physician if vomiting continues.  Special Instructions/Symptoms: Your throat may feel dry or sore from the anesthesia or the breathing tube placed in your throat during surgery. If this causes discomfort, gargle with warm salt water. The discomfort should disappear within 24 hours.   Regional Anesthesia Blocks  1. Numbness or the inability to move the "blocked" extremity may last from 3-48 hours after placement. The length of time depends on the medication injected and your individual response to the medication. If the numbness is not going away after 48 hours, call your surgeon.  2. The extremity that is blocked will need to be protected until the numbness is gone and the  Strength has returned. Because you cannot feel it, you will need to take extra care to avoid injury. Because it may be weak, you may have difficulty moving it or using it. You may not know what position it is in without looking at it while the block is in effect.  3. For blocks in the legs and feet, returning to weight bearing and walking needs to be done carefully. You will need to wait until the numbness is entirely gone and the strength has returned. You should be able to move your leg and foot normally before you try and bear weight or walk. You will need someone to be with you when you first try to ensure  you do not fall and possibly risk injury.  4. Bruising and tenderness at the needle site are common side effects and will resolve in a few days.  5. Persistent numbness or new problems with movement should be communicated to the surgeon or the [facility] [phone] [place] [phone]). [facility] Instructions Hand Surgery  Wound Care: Keep your hand elevated above the level of your heart.  Do not allow it to dangle by your side.  Keep the dressing dry and do not remove it unless your doctor advises you to do so.  He will usually change it at the time of your post-op visit.  Moving your fingers is advised to stimulate circulation but will depend on the site of your surgery.  If you have a splint applied, your doctor will advise you regarding movement.  Activity: Do not drive or operate machinery today.  Rest today and then you may return to your normal activity and work as indicated by your physician.  Diet:  Drink liquids today or eat a light diet.  You may resume a regular diet tomorrow.    General expectations: Pain for two to three days. Fingers may become slightly swollen.  Call your doctor if any of the following occur: Severe pain not relieved by pain medication. Elevated temperature. Dressing soaked with blood. Inability to move fingers. White or bluish color to fingers.   Post Anesthesia Home Care Instructions  Activity: Get plenty of rest for the remainder of the day. A responsible  adult should stay with you for 24 hours following the procedure.  For the next 24 hours, DO NOT: -Drive a car -Paediatric nurse -Drink alcoholic beverages -Take any medication unless instructed by your physician -Make any legal decisions or sign important papers.  Meals: Start with liquid foods such as gelatin or soup. Progress to regular foods as tolerated. Avoid greasy, spicy, heavy foods. If nausea and/or vomiting occur, drink only clear liquids  until the nausea and/or vomiting subsides. Call your physician if vomiting continues.  Special Instructions/Symptoms: Your throat may feel dry or sore from the anesthesia or the breathing tube placed in your throat during surgery. If this causes discomfort, gargle with warm salt water. The discomfort should disappear within 24 hours.   Regional Anesthesia Blocks  1. Numbness or the inability to move the "blocked" extremity may last from 3-48 hours after placement. The length of time depends on the medication injected and your individual response to the medication. If the numbness is not going away after 48 hours, call your surgeon.  2. The extremity that is blocked will need to be protected until the numbness is gone and the  Strength has returned. Because you cannot feel it, you will need to take extra care to avoid injury. Because it may be weak, you may have difficulty moving it or using it. You may not know what position it is in without looking at it while the block is in effect.  3. For blocks in the legs and feet, returning to weight bearing and walking needs to be done carefully. You will need to wait until the numbness is entirely gone and the strength has returned. You should be able to move your leg and foot normally before you try and bear weight or walk. You will need someone to be with you when you first try to ensure you do not fall and possibly risk injury.  4. Bruising and tenderness at the needle site are common side effects and will resolve in a few days.  5. Persistent numbness or new problems with movement should be communicated to the surgeon or the [facility] [phone] [place] [phone]).

## 2013-08-07 NOTE — H&P (Signed)
Rose [name] is a 73 year old female complaining of pain in her right thumb basilar joint. She is 72 and right handed. She complains of a mild aching type pain occasionally sharp in nature with activities. This has not resolved with bracing and anti-inflammatories or injections. She desires proceeding to have something done with the basilar joint arthritis.  PAST MEDICAL HISTORY: She is allergic to Morphine. She is on the following medications: Diovan, Amlodipine, Synthroid, Oxybutynin, Nexium, Buperpion, and Cymbalta. She has had the past surgeries: Left hip replacement, cervical spine, spine fusion.  FAMILY H ISTORY: Unknown.  SOCIAL HISTORY: She does not smoke or drink. She is retired.  REVIEW OF SYSTEMS: Positive for glasses and seizures, otherwise negative for 14 points. Rose [name] is an 73 y.o. female.   Chief Complaint: DJD right thumb HPI: see above  Past Medical History  Diagnosis Date  . Other tenosynovitis of hand and wrist   . Peripheral neuropathy   . Personal history of alcoholism   . DJD (degenerative joint disease) of hip   . Myalgia and myositis, unspecified   . History of depression   . Morton's neuroma     Hx of, Left  . Carotid bruit     Left  . History of spinal fusion   . Personal history of peptic ulcer disease   . Solitary cyst of breast   . Basal cell carcinoma of face   . Attention deficit disorder without mention of hyperactivity   . Unspecified hypothyroidism   . Benign heart murmur   . Esophageal reflux   . Hypertension   . Pulmonary nodule   . Chronic bronchitis   . Wears glasses   . Abnormal blood pressure     left arm from old brachial artery repair  . Subclavian steal syndrome   . Carotid artery disease   . Atherosclerosis of renal artery   . Centrilobular emphysema     Past Surgical History  Procedure Laterality Date  . Appendectomy    . Tonsillectomy and adenoidectomy    . Left brachial artery repair of pseudoaneurysm  2001     post a cath  . Percutanous transluminal angioplasty of renal arteris    . Lumbar fusion  09/2004    T12-L5 (Dr. Patrice Paradise)  . Cervical fusion  2007    Dr. Valli Glance approach  . Total hip arthroplasty  2008    left  . Shoulder arthroscopy  09/2008    Left  . Orif tibia fracture  2010    Left distal  . Wisdom tooth extraction      Family History  Problem Relation Age of Onset  . Adopted: Yes  . Schizophrenia Son     Deceased 25  . Hypertension Daughter     Renal  . Healthy Son     x1  . Healthy Daughter     x2   Social History:  reports that she quit smoking about 30 years ago. Her smoking use included Cigarettes. She has a 25 pack-year smoking history. She does not have any smokeless tobacco history on file. She reports that she drinks alcohol. She reports that she does not use illicit drugs.  Allergies:  Allergies  Allergen Reactions  . Amoxicillin     REACTION: unspecified  . Codeine   . Hydrocodone-Acetaminophen     REACTION: itching  . Hydromorphone Hcl   . Morphine And Related Hives and Itching  . Morphine Sulfate     REACTION: unspecified  Medications Prior to Admission  Medication Sig Dispense Refill  . amLODipine (NORVASC) 10 MG tablet TAKE 1 TABLET DAILY  90 tablet  0  . aspirin 81 MG tablet Take 81 mg by mouth daily.       Marland Kitchen buPROPion (WELLBUTRIN XL) 300 MG 24 hr tablet Take 1 tablet (300 mg total) by mouth daily.  30 tablet  1  . Calcium Carbonate-Vitamin D (CALCIUM-VITAMIN D) 500-200 MG-UNIT per tablet Take 1 tablet by mouth daily.  30 tablet  5  . diazepam (VALIUM) 5 MG tablet Take 1 tablet (5 mg total) by mouth every 12 (twelve) hours as needed for anxiety or muscle spasms (use sparingly).  30 tablet  0  . DULoxetine (CYMBALTA) 60 MG capsule Take 1 capsule (60 mg total) by mouth daily.  30 capsule  1  . ferrous sulfate dried (SLOW FE) 160 (50 FE) MG TBCR Take 160 mg by mouth 2 (two) times daily.       . Glucosamine 500 MG TABS Take 1 tablet by  mouth 2 (two) times daily. Glucosamine-Chondronton      . L-Methylfolate 15 MG TABS Take 1 tablet (15 mg total) by mouth daily.  30 tablet  1  . levothyroxine (SYNTHROID, LEVOTHROID) 112 MCG tablet Take 1 tablet (112 mcg total) by mouth daily before breakfast.  30 tablet  1  . solifenacin (VESICARE) 10 MG tablet Take 1 tablet (10 mg total) by mouth daily.  30 tablet  1  . valsartan (DIOVAN) 160 MG tablet TAKE 1 TABLET (160 MG TOTAL) BY MOUTH DAILY.  30 tablet  1    No results found for this or any previous visit (from the past 48 hour(s)).  No results found.   Pertinent items are noted in HPI.  Height 5\' 3"  (1.6 m), weight 50.803 kg (112 lb).  General appearance: alert, cooperative and appears stated age Head: Normocephalic, without obvious abnormality Neck: no JVD Resp: clear to auscultation bilaterally Cardio: regular rate and rhythm, S1, S2 normal, no murmur, click, rub or gallop GI: soft, non-tender; bowel sounds normal; no masses,  no organomegaly Extremities: extremities normal, atraumatic, no cyanosis or edema Pulses: 2+ and symmetric Skin: Skin color, texture, turgor normal. No rashes or lesions Neurologic: Grossly normal Incision/Wound: na  Assessment/Plan X-rays reveal pantrapezial arthritis.  She desires proceeding to have this surgically treated. We would recommend suspensionplasty of her right thumb. The pre, peri and post op course are discussed along with risks and complications. She is aware there is no guarantee with surgery, possibility of infection, recurrence, injury to arteries, nerves and tendons, incomplete relief of symptoms, use of the abductor pollicis longus as a tendon graft and dystrophy. This will be scheduled as an outpatient under regional anesthesia right arm.  Wynonia Sours 08/07/2013, 7:02 AM

## 2013-08-07 NOTE — Brief Op Note (Signed)
08/07/2013  10:21 AM  PATIENT:  Chriss Czar  73 y.o. female  PRE-OPERATIVE DIAGNOSIS:  carpometacarpal arthritis right thumb  POST-OPERATIVE DIAGNOSIS:  carpometacarpal arthritis right thum  PROCEDURE:  Procedure(s): CARPOMETACARPEL ([facility]) SUSPENSION PLASTY RIGHT THUMB (Right)  SURGEON:  Surgeon(s) and Role:    * Wynonia Sours, MD - Primary  PHYSICIAN ASSISTANT:   ASSISTANTS: none   ANESTHESIA:   regional and general  EBL:  Total I/O In: 1200 [I.V.:1200] Out: -   BLOOD ADMINISTERED:none  DRAINS: none   LOCAL MEDICATIONS USED:  NONE  SPECIMEN:  No Specimen  DISPOSITION OF SPECIMEN:  N/A  COUNTS:  YES  TOURNIQUET:   Total Tourniquet Time Documented: Upper Arm (Right) - 66 minutes Total: Upper Arm (Right) - 66 minutes   DICTATION: .Other Dictation: Dictation Number [id number]  PLAN OF CARE: Discharge to home after PACU  PATIENT DISPOSITION:  PACU - hemodynamically stable.

## 2013-08-07 NOTE — Progress Notes (Signed)
Assisted Dr. Crews with right, ultrasound guided, supraclavicular block. Side rails up, monitors on throughout procedure. See vital signs in flow sheet. Tolerated Procedure well. 

## 2013-08-07 NOTE — Op Note (Signed)
Dictation Number [id number]

## 2013-08-07 NOTE — Anesthesia Preprocedure Evaluation (Addendum)
Anesthesia Evaluation  Patient identified by MRN, date of birth, ID band Patient awake    Reviewed: Allergy & Precautions, NPO status   Airway Mallampati: I TM Distance: >3 FB Neck ROM: Full    Dental  (+) Teeth Intact, Dental Advisory Given   Pulmonary COPDformer smoker,  breath sounds clear to auscultation        Cardiovascular hypertension, Pt. on medications + Peripheral Vascular Disease Rhythm:Regular Rate:Normal     Neuro/Psych PSYCHIATRIC DISORDERS Depression    GI/Hepatic GERD-  Medicated,  Endo/Other  Hypothyroidism   Renal/GU Renal disease     Musculoskeletal   Abdominal   Peds  Hematology   Anesthesia Other Findings   Reproductive/Obstetrics                         Anesthesia Physical Anesthesia Plan  ASA: II  Anesthesia Plan: General   Post-op Pain Management:    Induction: Intravenous  Airway Management Planned: LMA  Additional Equipment:   Intra-op Plan:   Post-operative Plan: Extubation in OR  Informed Consent: I have reviewed the patients History and Physical, chart, labs and discussed the procedure including the risks, benefits and alternatives for the proposed anesthesia with the patient or authorized representative who has indicated his/her understanding and acceptance.   Dental advisory given  Plan Discussed with: CRNA, Anesthesiologist and Surgeon  Anesthesia Plan Comments:         Anesthesia Quick Evaluation

## 2013-08-07 NOTE — Anesthesia Postprocedure Evaluation (Signed)
  Anesthesia Post-op Note  Patient: Rose [name]  Procedure(s) Performed: Procedure(s): CARPOMETACARPEL ([place]) SUSPENSION PLASTY RIGHT THUMB (Right)  Patient Location: PACU  Anesthesia Type:GA combined with regional for post-op pain  Level of Consciousness: awake, alert  and oriented  Airway and Oxygen Therapy: Patient Spontanous Breathing  Post-op Pain: none  Post-op Assessment: Post-op Vital signs reviewed  Post-op Vital Signs: Reviewed  Last Vitals:  Filed Vitals:   08/07/13 1100  BP: 112/74  Pulse: 80  Temp:   Resp: 17    Complications: No apparent anesthesia complications

## 2013-08-08 NOTE — Op Note (Signed)
NAMEANJELI, Rose [name]              ACCOUNT NO.:  [id number]  MEDICAL RECORD NO.:  [id number]  LOCATION:                                 FACILITY:  PHYSICIAN:  Daryll Brod, M.D.            DATE OF BIRTH:  DATE OF PROCEDURE:  08/07/2013 DATE OF DISCHARGE:                              OPERATIVE REPORT   PREOPERATIVE DIAGNOSIS:  Degenerative arthritis, carpometacarpal joint, scaphotrapeziotrapezoidal joint, right thumb.  POSTOPERATIVE DIAGNOSIS:  Degenerative arthritis, carpometacarpal joint, scaphotrapeziotrapezoidal joint, right thumb.  OPERATION:  Excision of trapezium, partial resection of trapezoid with suspensionplasty using adductor pollicis longus tendon, right thumb.  SURGEON:  Daryll Brod, MD  ANESTHESIA:  Supraclavicular block, general.  ANESTHESIOLOGIST:  Lorrene Reid, M.D.  HISTORY:  The patient is a 73 year old female with a history of pantrapezial arthritis not responsive to conservative treatment.  She has elected to undergo suspension arthroplasty.  Pre, peri and postoperative course have been discussed along with risks and complications.  She is aware that there is no guarantee with surgery, possibility of infection; recurrence of injury to arteries, nerves, tendons, incomplete relief of symptoms, dystrophy.  In the preoperative area, the patient is seen, the extremity marked by both patient and surgeon.  Antibiotic given.  PROCEDURE IN DETAIL:  The patient was brought to the operating room, where a supraclavicular block carried out.  The preoperative area was then supplemented with a general anesthetic.  She was prepped using ChloraPrep, supine position, and right arm free.  A 3-minute dry time was allowed.  Time-out taken, confirming the patient and procedure.  The limb was exsanguinated with an Esmarch bandage.  Tourniquet placed high on the arm was inflated to 250 mmHg.  A curvilinear incision was made over the base of the first thumb in the metacarpal  carpal joint, carried along the first dorsal extensor compartment, carried down through subcutaneous tissue.  Bleeders were electrocauterized with bipolar. Dissection was carried between the abductor pollicis longus and extensor pollicis brevis tendon.  The joint was opened.  The radial artery identified proximally and the STT joint was also opened.  The periosteum was then elevated off the trapezium.  The trapezium was densely adherent to the trapezoid. The trapezium was then resected in sections using a rongeur.  This allowed visualization of the trapezoid which was spared. Significant degenerative changes were present at the trapezoid scaphoid joint and the proximal aspect of the trapezoid was removed with a rongeur.  Trapezium was removed as possible.  Drill hole was then placed in the base of the thumb metacarpal from the distal proximal direction. A drill hole was then placed in the base of the index metacarpal just volar and distal to the articular surface or for the first metacarpal. The separate incision was then made dorsally longitudinally, where the drill hole came out dorsally on the second metacarpal.  The most dorsal slip to the abductor pollicis longus tendon was then harvested.  A separate incision was made proximally at the musculotendinous junction. A Carroll tendon Passer was then passed through the first dorsal compartment.  A 32-gauge monofilament wire was passed around the most dorsal portion of the abductor pollicis  longus slip.  This was then harvested as a cheese cutter transecting it at the musculotendinous junction.  This was delivered distally.  A 32-gauge monofilament wire was then used to pass the abductor pollicis longus tendon, left attached to the base of the first metacarpal through the distal all proximally through the base of the thumb metacarpal then in a volar to dorsal direction through the second metacarpal.  This was retrieved dorsally. A hemostat  was then used to create a tunnel along the base of the metacarpal proximal to the extensor carpi radialis tendon and the tendon was then passed around the base of the first metacarpal and brought into the wound volarly.  This was then sutured to the abductor pollicis longus tendons and went into the second metacarpal and further reinforced with sutures to the egress from the base of the first metacarpal and then sutured onto itself again with 4-0 FiberWire sutures.  This was done with the metacarpal fully pulled to the metacarpal base with good suspension of the thumb from the second metacarpal.  The wounds were copiously irrigated with saline.  The dorsal wound on the second metacarpal was closed with interrupted 4-0 Vicryl Rapide.  The area harvesting of the base or the musculotendinous junction was closed with interrupted 4-0 Vicryl.  The capsule was then sutured closed with 4-0 Vicryl sutures, imbricating it so that it held it into an abducted position.  The subcutaneous tissue closed with interrupted 4-0 Vicryl and the skin with interrupted 4-0 Vicryl Rapide sutures.  Sterile compressive dressing, dorsal palmar thumb spica splint applied.  On deflation of the tourniquet, all fingers immediately pinked including the thumb.  She was taken to the recovery room for observation in a satisfactory condition.  She will be discharged home to return to the [facility] in 1 week on Percocet.          ______________________________ Daryll Brod, M.D.     GK/MEDQ  D:  08/07/2013  T:  08/08/2013  Job:  [id number]

## 2013-08-09 ENCOUNTER — Encounter (HOSPITAL_BASED_OUTPATIENT_CLINIC_OR_DEPARTMENT_OTHER): Payer: Self-pay | Admitting: Orthopedic Surgery

## 2013-08-14 ENCOUNTER — Institutional Professional Consult (permissible substitution): Payer: No Typology Code available for payment source | Admitting: Emergency Medicine

## 2013-08-14 ENCOUNTER — Telehealth: Payer: Self-pay | Admitting: Physician Assistant

## 2013-08-14 ENCOUNTER — Encounter (HOSPITAL_COMMUNITY)
Admission: RE | Admit: 2013-08-14 | Discharge: 2013-08-14 | Disposition: A | Discharge status: Home / Self Care | Payer: Medicare Other | Source: Ambulatory Visit | Attending: Physician Assistant | Admitting: Physician Assistant

## 2013-08-14 DIAGNOSIS — R0609 Other forms of dyspnea: Secondary | ICD-10-CM | POA: Insufficient documentation

## 2013-08-14 DIAGNOSIS — R222 Localized swelling, mass and lump, trunk: Secondary | ICD-10-CM | POA: Diagnosis not present

## 2013-08-14 DIAGNOSIS — R059 Cough, unspecified: Secondary | ICD-10-CM | POA: Insufficient documentation

## 2013-08-14 DIAGNOSIS — J8410 Pulmonary fibrosis, unspecified: Secondary | ICD-10-CM | POA: Diagnosis not present

## 2013-08-14 DIAGNOSIS — R0989 Other specified symptoms and signs involving the circulatory and respiratory systems: Secondary | ICD-10-CM | POA: Insufficient documentation

## 2013-08-14 DIAGNOSIS — R918 Other nonspecific abnormal finding of lung field: Secondary | ICD-10-CM

## 2013-08-14 DIAGNOSIS — R05 Cough: Secondary | ICD-10-CM | POA: Insufficient documentation

## 2013-08-14 LAB — GLUCOSE, CAPILLARY: GLUCOSE-CAPILLARY: 87 mg/dL (ref 70–99)

## 2013-08-14 MED ORDER — FLUDEOXYGLUCOSE F - 18 (FDG) INJECTION
6.4000 | Freq: Once | INTRAVENOUS | Status: AC | PRN
  Administered 2013-08-14: 6.4 via INTRAVENOUS

## 2013-08-14 NOTE — Telephone Encounter (Signed)
Discussed PET scan results.  No evidence of malignancy. Calcified granuloma.  Follow-up with Dr. Lamonte Sakai as scheduled.

## 2013-08-16 DIAGNOSIS — M19049 Primary osteoarthritis, unspecified hand: Secondary | ICD-10-CM | POA: Diagnosis not present

## 2013-08-21 ENCOUNTER — Institutional Professional Consult (permissible substitution): Payer: No Typology Code available for payment source | Admitting: Emergency Medicine

## 2013-08-28 DIAGNOSIS — D485 Neoplasm of uncertain behavior of skin: Secondary | ICD-10-CM | POA: Diagnosis not present

## 2013-08-28 DIAGNOSIS — C44319 Basal cell carcinoma of skin of other parts of face: Secondary | ICD-10-CM | POA: Diagnosis not present

## 2013-08-28 DIAGNOSIS — Z85828 Personal history of other malignant neoplasm of skin: Secondary | ICD-10-CM | POA: Diagnosis not present

## 2013-09-03 ENCOUNTER — Telehealth: Payer: Self-pay | Admitting: Physician Assistant

## 2013-09-03 ENCOUNTER — Encounter: Payer: Self-pay | Admitting: Physician Assistant

## 2013-09-03 DIAGNOSIS — F988 Other specified behavioral and emotional disorders with onset usually occurring in childhood and adolescence: Secondary | ICD-10-CM

## 2013-09-03 DIAGNOSIS — J432 Centrilobular emphysema: Secondary | ICD-10-CM

## 2013-09-03 NOTE — Telephone Encounter (Signed)
Per Chantel at Pulmonary, patient refuses to see a Pulmonologist.

## 2013-09-03 NOTE — Telephone Encounter (Signed)
Patient with complaints of chronic SOB. She also had a suspicious mass on imaging but PET scan ruled out malignancy.  He was sent to Pulmonary for lung testing and evaluation of her SOB.  Please call patient and assess why she is not going to the specialist.

## 2013-09-05 ENCOUNTER — Telehealth: Payer: Self-pay | Admitting: Physician Assistant

## 2013-09-05 NOTE — Telephone Encounter (Signed)
Ivin Booty, please contact pt she is very concerned

## 2013-09-05 NOTE — Telephone Encounter (Signed)
Pt wants abnormal EKG and Cardiac problems deleted off of her chart, state they are wrong due to our 12-lead EKG errors

## 2013-09-05 NOTE — Telephone Encounter (Signed)
I have removed the abnormal EKG from her problem list.  The other diagnoses -- carotid bruit, coronary artery disease, etc that were placed in record by previous PCP (Dr. Linda Hedges) will remain in her chart.

## 2013-09-05 NOTE — Telephone Encounter (Signed)
Pt states that canceled prior appt d/t PET scan showing Normal results, she has appt w/Dr Elsworth Soho on 06.25.15/SLS

## 2013-09-05 NOTE — Telephone Encounter (Signed)
Patient informed, understood & agreed; states she will contact Dr. Justice Deeds

## 2013-09-06 ENCOUNTER — Encounter: Payer: Self-pay | Admitting: Pulmonary Disease

## 2013-09-06 ENCOUNTER — Ambulatory Visit (INDEPENDENT_AMBULATORY_CARE_PROVIDER_SITE_OTHER): Payer: Medicare Other | Admitting: Pulmonary Disease

## 2013-09-06 VITALS — HR 70 | Temp 97.9°F | Resp 16 | Ht 63.0 in | Wt 112.0 lb

## 2013-09-06 DIAGNOSIS — J4489 Other specified chronic obstructive pulmonary disease: Secondary | ICD-10-CM

## 2013-09-06 DIAGNOSIS — J449 Chronic obstructive pulmonary disease, unspecified: Secondary | ICD-10-CM

## 2013-09-06 NOTE — Progress Notes (Signed)
Subjective:    Patient ID: Rose [name], female    DOB: 05-08-1940, 73 y.o.   MRN: [id number]  HPI   73 year old remote ex-smoker presents for evaluation of emphysema. She reports dyspnea and exertion that was first noted about 6 years ago when she underwent evaluation for hip replacement. She reports dyspnea on and off and for some reason associates this with her diagnosis of peripheral neuropathy. She smoked about a pack per day since the age of 27 until age 44, but 4 pack years. She lived in [place] for about 30 years, who to [place] for about 2 years and then moved back. She underwent CT chest in 11/2012 which mentioned centrilobular emphysema in both upper lobes. This also showed a calcified left lower lobe 0.8 cm nodule and evidence of old granulomatous disease in the spleen and 8.9 cm left upper lobe groundglass opacity. PET scan in 08/2013 did not show any hypermetabolic. The left upper lobe groundglass opacity has resolved.  She reports mild dyspnea when she is walking back from [employer] with her shopping cart. She denies pedal edema, chest pain, orthopnea, paroxysmal nocturnal dyspnea. She denies wheezing or frequent chest colds. She reports an early morning cough with minimal sputum production.  Spirometry today was a good effort and showed truncation of the expiratory limb. There was mild airway obstruction with ratio 69, but FEV1 was well preserved at 1.84-92% with FVC of 2.67-100%.  She has a past medical history of hypertension due to renal artery stenosis, attention deficit hyperactivity disorder on medications and subclavian steal syndrome with absent pulses in the left brachial and radial.   Past Medical History  Diagnosis Date  . Other tenosynovitis of hand and wrist   . Peripheral neuropathy   . Personal history of alcoholism   . DJD (degenerative joint disease) of hip   . Myalgia and myositis, unspecified   . History of depression   . Morton's  neuroma     Hx of, Left  . Carotid bruit     Left  . History of spinal fusion   . Personal history of peptic ulcer disease   . Solitary cyst of breast   . Basal cell carcinoma of face   . Attention deficit disorder without mention of hyperactivity   . Unspecified hypothyroidism   . Benign heart murmur   . Esophageal reflux   . Hypertension   . Pulmonary nodule   . Chronic bronchitis   . Wears glasses   . Abnormal blood pressure     left arm from old brachial artery repair  . Subclavian steal syndrome   . Carotid artery disease   . Atherosclerosis of renal artery   . Centrilobular emphysema     Past Surgical History  Procedure Laterality Date  . Appendectomy    . Tonsillectomy and adenoidectomy    . Left brachial artery repair of pseudoaneurysm  2001    post a cath  . Percutanous transluminal angioplasty of renal arteris    . Lumbar fusion  09/2004    T12-L5 (Dr. Patrice Paradise)  . Cervical fusion  2007    Dr. Valli Glance approach  . Total hip arthroplasty  2008    left  . Shoulder arthroscopy  09/2008    Left  . Orif tibia fracture  2010    Left distal  . Wisdom tooth extraction    . Carpometacarpel suspension plasty Right 08/07/2013    Procedure: CARPOMETACARPEL [facility]) SUSPENSION PLASTY RIGHT THUMB;  Surgeon: Dominica Severin  Beola Cord, MD;  Location: [facility];  Service: Orthopedics;  Laterality: Right;    Allergies  Allergen Reactions  . Amoxicillin     REACTION: unspecified  . Codeine   . Hydrocodone-Acetaminophen     REACTION: itching  . Hydromorphone Hcl   . Morphine And Related Hives and Itching  . Morphine Sulfate     REACTION: unspecified    History   Social History  . Marital Status: Single    Spouse Name: N/A    Number of Children: Y  . Years of Education: N/A   Occupational History  . retired     Engineer, maintenance (IT), had own firm   Social History Main Topics  . Smoking status: Former Smoker -- 1.00 packs/day for 25 years    Types: Cigarettes    Quit  date: 03/16/1983  . Smokeless tobacco: Never Used  . Alcohol Use: Yes     Comment: occ-hx alcohol abuse  . Drug Use: No  . Sexual Activity: Not on file   Other Topics Concern  . 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                   Family History  Problem Relation Age of Onset  . Adopted: Yes  . Schizophrenia Son     Deceased 58  . Hypertension Daughter     Renal  . Healthy Son     x1  . Healthy Daughter     x2     Review of Systems  Constitutional: Negative for fever and unexpected weight change.  HENT: Negative for congestion, dental problem, ear pain, nosebleeds, postnasal drip, rhinorrhea, sinus pressure, sneezing, sore throat and trouble swallowing.   Eyes: Negative for redness and itching.  Respiratory: Positive for shortness of breath. Negative for cough, chest tightness and wheezing.   Cardiovascular: Negative for palpitations and leg swelling.  Gastrointestinal: Negative for nausea and vomiting.  Genitourinary: Negative for dysuria.  Musculoskeletal: Negative for joint swelling.  Skin: Negative for rash.  Neurological: Negative for headaches.  Hematological: Does not bruise/bleed easily.  Psychiatric/Behavioral: Negative for dysphoric mood. The patient is not nervous/anxious.        Objective:   Physical Exam  Gen. Pleasant, thin woman, in no distress, normal affect ENT - no lesions, no post nasal drip Neck: No JVD, no thyromegaly, no carotid bruits Lungs: no use of accessory muscles, no dullness to percussion, clear without rales or rhonchi  Cardiovascular: Rhythm regular, heart sounds  normal, no murmurs or gallops, no peripheral edema Abdomen: soft and non-tender, no  hepatosplenomegaly, BS normal. Musculoskeletal: No deformities, no cyanosis or clubbing, absent pulses in the left brachial and radial Neuro:  alert, non focal       Assessment & Plan:

## 2013-09-06 NOTE — Patient Instructions (Signed)
Emphysema is mild - no medications required. PET scan is normal, no followup scanning required Call us as needed

## 2013-09-06 NOTE — Assessment & Plan Note (Signed)
Emphysema is mild - no medications required. PET scan is normal, no followup scanning required Her shortness of breath might be related to deconditioning, I doubt a cardiac etiology, I have asked her to Call us as needed or if symptoms get worse

## 2013-09-18 ENCOUNTER — Institutional Professional Consult (permissible substitution): Payer: No Typology Code available for payment source | Admitting: Pulmonary Disease

## 2013-09-18 ENCOUNTER — Other Ambulatory Visit: Payer: Self-pay | Admitting: Physician Assistant

## 2013-09-18 DIAGNOSIS — Z85828 Personal history of other malignant neoplasm of skin: Secondary | ICD-10-CM | POA: Diagnosis not present

## 2013-09-18 DIAGNOSIS — C44319 Basal cell carcinoma of skin of other parts of face: Secondary | ICD-10-CM | POA: Diagnosis not present

## 2013-09-18 DIAGNOSIS — D485 Neoplasm of uncertain behavior of skin: Secondary | ICD-10-CM | POA: Diagnosis not present

## 2013-09-21 ENCOUNTER — Encounter: Payer: Self-pay | Admitting: Physician Assistant

## 2013-09-21 ENCOUNTER — Ambulatory Visit (INDEPENDENT_AMBULATORY_CARE_PROVIDER_SITE_OTHER): Payer: Medicare Other | Admitting: Physician Assistant

## 2013-09-21 VITALS — BP 96/58 | HR 79 | Temp 98.0°F | Resp 14 | Ht 63.0 in | Wt 113.5 lb

## 2013-09-21 DIAGNOSIS — F32A Depression, unspecified: Secondary | ICD-10-CM

## 2013-09-21 DIAGNOSIS — F329 Major depressive disorder, single episode, unspecified: Secondary | ICD-10-CM

## 2013-09-21 DIAGNOSIS — F3289 Other specified depressive episodes: Secondary | ICD-10-CM | POA: Diagnosis not present

## 2013-09-21 DIAGNOSIS — M19049 Primary osteoarthritis, unspecified hand: Secondary | ICD-10-CM | POA: Diagnosis not present

## 2013-09-21 DIAGNOSIS — F988 Other specified behavioral and emotional disorders with onset usually occurring in childhood and adolescence: Secondary | ICD-10-CM

## 2013-09-21 DIAGNOSIS — I10 Essential (primary) hypertension: Secondary | ICD-10-CM

## 2013-09-21 MED ORDER — DIAZEPAM 5 MG PO TABS: 5.0000 mg | ORAL_TABLET | Freq: Two times a day (BID) | ORAL | Status: DC | PRN

## 2013-09-21 MED ORDER — AMPHETAMINE-DEXTROAMPHET ER 20 MG PO CP24: 20.0000 mg | ORAL_CAPSULE | ORAL | Status: DC

## 2013-09-21 NOTE — Patient Instructions (Signed)
Please continue medications as directed.  STOP Ritalin and begin Adderall XR.  Monitor BP twice daily and write down in a notebook.  Please bring to follow-up in 2 weeks.  Return sooner if needed.

## 2013-09-21 NOTE — Progress Notes (Signed)
Patient presents to clinic today to discuss change in ADD medication.  Patient also endorses intermittent mildly elevated BP readings at home.  Endorses BP in 140s/90s.  Endorses taking medications as directed. Denies chest pain, palpitations, vision changes, headaches, lightheadedness or dizziness. BP normotensive in clinic.  Patient does endorse increased salt intake.  Past Medical History  Diagnosis Date  . Other tenosynovitis of hand and wrist   . Peripheral neuropathy   . Personal history of alcoholism   . DJD (degenerative joint disease) of hip   . Myalgia and myositis, unspecified   . History of depression   . Morton's neuroma     Hx of, Left  . Carotid bruit     Left  . History of spinal fusion   . Personal history of peptic ulcer disease   . Solitary cyst of breast   . Basal cell carcinoma of face   . Attention deficit disorder without mention of hyperactivity   . Unspecified hypothyroidism   . Benign heart murmur   . Esophageal reflux   . Hypertension   . Pulmonary nodule   . Chronic bronchitis   . Wears glasses   . Abnormal blood pressure     left arm from old brachial artery repair  . Subclavian steal syndrome   . Carotid artery disease   . Atherosclerosis of renal artery   . Centrilobular emphysema     Current Outpatient Prescriptions on File Prior to Visit  Medication Sig Dispense Refill  . amLODipine (NORVASC) 10 MG tablet TAKE 1 TABLET DAILY  90 tablet  0  . aspirin 81 MG tablet Take 81 mg by mouth daily.       Marland Kitchen buPROPion (WELLBUTRIN XL) 300 MG 24 hr tablet TAKE ONE (1) TABLET EACH DAY  30 tablet  2  . Calcium Carbonate-Vitamin D (CALCIUM-VITAMIN D) 500-200 MG-UNIT per tablet Take 1 tablet by mouth daily.  30 tablet  5  . ferrous sulfate dried (SLOW FE) 160 (50 FE) MG TBCR Take 160 mg by mouth 2 (two) times daily.       . Glucosamine 500 MG TABS Take 1 tablet by mouth 2 (two) times daily. Glucosamine-Chondronton      . L-Methylfolate 15 MG TABS Take 1 tablet  (15 mg total) by mouth daily.  30 tablet  1  . levothyroxine (SYNTHROID, LEVOTHROID) 112 MCG tablet TAKE ONE (1) TABLET EACH DAY BEFORE BREAKFAST  30 tablet  2  . oxyCODONE-acetaminophen (ROXICET) 5-325 MG per tablet Take 1 tablet by mouth every 4 (four) hours as needed.  30 tablet  0  . valsartan (DIOVAN) 160 MG tablet TAKE 1 TABLET (160 MG TOTAL) BY MOUTH DAILY.  30 tablet  1  . VESICARE 10 MG tablet TAKE ONE (1) TABLET EACH DAY  30 tablet  2   No current facility-administered medications on file prior to visit.    Allergies  Allergen Reactions  . Amoxicillin     REACTION: unspecified  . Codeine   . Hydrocodone-Acetaminophen     REACTION: itching  . Hydromorphone Hcl   . Morphine And Related Hives and Itching  . Morphine Sulfate     REACTION: unspecified    Family History  Problem Relation Age of Onset  . Adopted: Yes  . Schizophrenia Son     Deceased 28  . Hypertension Daughter     Renal  . Healthy Son     x1  . Healthy Daughter     x2  History   Social History  . Marital Status: Single    Spouse Name: N/A    Number of Children: Y  . Years of Education: N/A   Occupational History  . retired     Engineer, maintenance (IT), had own firm   Social History Main Topics  . Smoking status: Former Smoker -- 1.00 packs/day for 25 years    Types: Cigarettes    Quit date: 03/16/1983  . Smokeless tobacco: Never Used  . Alcohol Use: Yes     Comment: occ-hx alcohol abuse  . Drug Use: No  . Sexual Activity: None   Other Topics Concern  . 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                  Review of Systems - See HPI.  All other ROS are negative.  BP 96/58  Pulse 79  Temp(Src) 98 F (36.7 C) (Oral)  Resp 14  Ht 5'  3" (1.6 m)  Wt 113 lb 8 oz (51.483 kg)  BMI 20.11 kg/m2  SpO2 97%  Physical Exam  Vitals reviewed. Constitutional: She is oriented to person, place, and time and well-developed, well-nourished, and in no distress.  HENT:  Head: Normocephalic and atraumatic.  Eyes: Conjunctivae are normal.  Cardiovascular: Normal rate, regular rhythm, normal heart sounds and intact distal pulses.   Pulmonary/Chest: Effort normal and breath sounds normal. No respiratory distress. She has no wheezes. She has no rales. She exhibits no tenderness.  Neurological: She is alert and oriented to person, place, and time.  Skin: Skin is warm and dry. No rash noted.  Psychiatric: Affect normal.   Recent Results (from the past 2160 hour(s))  CBC WITH DIFFERENTIAL     Status: None   Collection Time    08/02/13 11:22 AM      Result Value Ref Range   WBC 5.4  4.0 - 10.5 K/uL   RBC 4.11  3.87 - 5.11 MIL/uL   Hemoglobin 13.3  12.0 - 15.0 g/dL   HCT 39.1  36.0 - 46.0 %   MCV 95.1  78.0 - 100.0 fL   MCH 32.4  26.0 - 34.0 pg   MCHC 34.0  30.0 - 36.0 g/dL   RDW 13.4  11.5 - 15.5 %   Platelets 212  150 - 400 K/uL   Neutrophils Relative % 48  43 - 77 %   Neutro Abs 2.6  1.7 - 7.7 K/uL   Lymphocytes Relative 39  12 - 46 %   Lymphs Abs 2.1  0.7 - 4.0 K/uL   Monocytes Relative 9  3 - 12 %   Monocytes Absolute 0.5  0.1 - 1.0 K/uL   Eosinophils Relative 3  0 - 5 %   Eosinophils Absolute 0.2  0.0 - 0.7 K/uL   Basophils Relative 1  0 - 1 %   Basophils Absolute 0.1  0.0 - 0.1 K/uL   Smear Review Criteria for review not met    COMPLETE METABOLIC PANEL WITH GFR     Status: Abnormal   Collection Time    08/02/13 11:22 AM      Result Value Ref Range   Sodium 140  135 - 145 mEq/L   Potassium 4.1  3.5 - 5.3 mEq/L   Chloride 103  96 - 112 mEq/L   CO2 31  19 - 32 mEq/L   Glucose, Bld 97  70 - 99 mg/dL   BUN 15  6 - 23 mg/dL   Creat 0.99  0.50 - 1.10 mg/dL   Total Bilirubin 0.5  0.2 - 1.2 mg/dL   Alkaline Phosphatase 70   39 - 117 U/L   AST 14  0 - 37 U/L   ALT 10  0 - 35 U/L   Total Protein 7.1  6.0 - 8.3 g/dL   Albumin 4.4  3.5 - 5.2 g/dL   Calcium 10.3  8.4 - 10.5 mg/dL   GFR, Est African American 65     GFR, Est Non African American 56 (*)    Comment:       The estimated GFR is a calculation valid for adults (>=71 years old)     that uses the CKD-EPI algorithm to adjust for age and sex. It is       not to be used for children, pregnant women, hospitalized patients,        patients on dialysis, or with rapidly changing kidney function.     According to the NKDEP, eGFR >89 is normal, 60-89 shows mild     impairment, 30-59 shows moderate impairment, 15-29 shows severe     impairment and <15 is ESRD.        TSH     Status: None   Collection Time    08/02/13 11:22 AM      Result Value Ref Range   TSH 0.690  0.350 - 4.500 uIU/mL  LIPID PANEL     Status: Abnormal   Collection Time    08/02/13 11:22 AM      Result Value Ref Range   Cholesterol 218 (*) 0 - 200 mg/dL   Comment: ATP III Classification:           < 200        mg/dL        Desirable          200 - 239     mg/dL        Borderline High          >= 240        mg/dL        High         Triglycerides 98  <150 mg/dL   HDL 74  >39 mg/dL   Total CHOL/HDL Ratio 2.9     VLDL 20  0 - 40 mg/dL   LDL Cholesterol 124 (*) 0 - 99 mg/dL   Comment:       Total Cholesterol/HDL Ratio:CHD Risk                            Coronary Heart Disease Risk Table                                            Men       Women              1/2 Average Risk              3.4        3.3  Average Risk              5.0        4.4               2X Average Risk              9.6        7.1               3X Average Risk             23.4       11.0     Use the calculated Patient Ratio above and the CHD Risk table      to determine the patient's CHD Risk.     ATP III Classification (LDL):           < 100        mg/dL         Optimal          100 - 129      mg/dL         Near or Above Optimal          130 - 159     mg/dL         Borderline High          160 - 189     mg/dL         High           > 190        mg/dL         Very High        URINALYSIS, ROUTINE W REFLEX MICROSCOPIC     Status: None   Collection Time    08/02/13 11:22 AM      Result Value Ref Range   Color, Urine YELLOW  YELLOW   APPearance CLEAR  CLEAR   Specific Gravity, Urine 1.018  1.005 - 1.030   pH 6.5  5.0 - 8.0   Glucose, UA NEG  NEG mg/dL   Bilirubin Urine NEG  NEG   Ketones, ur NEG  NEG mg/dL   Hgb urine dipstick NEG  NEG   Protein, ur NEG  NEG mg/dL   Urobilinogen, UA 0.2  0.0 - 1.0 mg/dL   Nitrite NEG  NEG   Leukocytes, UA NEG  NEG  POCT HEMOGLOBIN-HEMACUE     Status: None   Collection Time    08/07/13  7:41 AM      Result Value Ref Range   Hemoglobin 13.0  12.0 - 15.0 g/dL  GLUCOSE, CAPILLARY     Status: None   Collection Time    08/14/13  7:44 AM      Result Value Ref Range   Glucose-Capillary 87  70 - 99 mg/dL    Assessment/Plan: ADD (attention deficit disorder) Will attempt trial of Adderall XR at 20 mg. Follow-up in 1 month.   HYPERTENSION BP normotensive in clinic.  Continue current medications.  Suspect elevated BP is now stemming from increased salt intake.  DASH diet handout given.  Follow-up in 1 month. Bring a BP log to visit.

## 2013-09-21 NOTE — Progress Notes (Signed)
Pre visit review using our clinic review tool, if applicable. No additional management support is needed unless otherwise documented below in the visit note/SLS  

## 2013-09-24 ENCOUNTER — Other Ambulatory Visit: Payer: Self-pay | Admitting: Physician Assistant

## 2013-09-25 ENCOUNTER — Telehealth: Payer: Self-pay | Admitting: Physician Assistant

## 2013-09-25 DIAGNOSIS — F988 Other specified behavioral and emotional disorders with onset usually occurring in childhood and adolescence: Secondary | ICD-10-CM | POA: Insufficient documentation

## 2013-09-25 NOTE — Assessment & Plan Note (Addendum)
Will attempt trial of Adderall XR at 20 mg. Follow-up in 1 month.

## 2013-09-25 NOTE — Telephone Encounter (Signed)
Patient left message stating her [facility] should have faxed over paperwork to be completed but they are not sure where they faxed the forms, she would like to speak with someone in regards to the forms before we complete them, requesting call back

## 2013-09-25 NOTE — Assessment & Plan Note (Signed)
BP normotensive in clinic.  Continue current medications.  Suspect elevated BP is now stemming from increased salt intake.  DASH diet handout given.  Follow-up in 1 month. Bring a BP log to visit.

## 2013-09-26 NOTE — Telephone Encounter (Signed)
Called patient at 10:30.  No answer.  Left message for patient to return call.

## 2013-09-27 NOTE — Telephone Encounter (Signed)
Spoke with patient concerning forms.  Will finish forms and fax to [place].

## 2013-09-30 ENCOUNTER — Telehealth: Payer: Self-pay | Admitting: Physician Assistant

## 2013-09-30 NOTE — Telephone Encounter (Signed)
I have completed patient's paperwork for placement to [facility]. I cannot fax over forms until she has a PPD skin test as the facility is requiring one negative result within the past 6 months.  She needs to come in for placement and reading.

## 2013-10-01 NOTE — Telephone Encounter (Signed)
LMOM with contact name and number [[facility]] for return call to [facility] office RE: schedule Nurse Visit for PPD placement needed for [facility] per provider instructions/SLS

## 2013-10-01 NOTE — Telephone Encounter (Signed)
Appointment scheduled for 10/03/13

## 2013-10-03 ENCOUNTER — Ambulatory Visit (INDEPENDENT_AMBULATORY_CARE_PROVIDER_SITE_OTHER): Payer: Medicare Other | Admitting: *Deleted

## 2013-10-03 DIAGNOSIS — Z111 Encounter for screening for respiratory tuberculosis: Secondary | ICD-10-CM

## 2013-10-03 NOTE — Progress Notes (Signed)
   Subjective:    Patient ID: Rose [name], female    DOB: 02/24/1941, 73 y.o.   MRN: [id number]  HPI    Review of Systems     Objective:   Physical Exam        Assessment & Plan:  Tuberculin skin test applied to Right ventral forearm. Explained when to return to have test read, she will come back into office in 48-72 hours. 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 Patient was informed that if she did not agree to return for PPD reading that, first of all there would be no placement and secondly she would not receive her paperwork completed from [facility], as they required PPD placement & results; pt still did not agree until provider was brought in to room to reiterate what she had already been told, she once again used foul language, apologized for it, and then proceeded to change her mind & agreed to return to office for reading/SLS

## 2013-10-05 DIAGNOSIS — Z124 Encounter for screening for malignant neoplasm of cervix: Secondary | ICD-10-CM | POA: Diagnosis not present

## 2013-10-05 DIAGNOSIS — Z01419 Encounter for gynecological examination (general) (routine) without abnormal findings: Secondary | ICD-10-CM | POA: Diagnosis not present

## 2013-10-05 DIAGNOSIS — M62838 Other muscle spasm: Secondary | ICD-10-CM | POA: Diagnosis not present

## 2013-10-05 DIAGNOSIS — M2550 Pain in unspecified joint: Secondary | ICD-10-CM | POA: Diagnosis not present

## 2013-10-05 DIAGNOSIS — M545 Low back pain, unspecified: Secondary | ICD-10-CM | POA: Diagnosis not present

## 2013-10-05 DIAGNOSIS — N318 Other neuromuscular dysfunction of bladder: Secondary | ICD-10-CM | POA: Diagnosis not present

## 2013-10-05 DIAGNOSIS — Z1231 Encounter for screening mammogram for malignant neoplasm of breast: Secondary | ICD-10-CM | POA: Diagnosis not present

## 2013-10-05 DIAGNOSIS — Z0000 Encounter for general adult medical examination without abnormal findings: Secondary | ICD-10-CM | POA: Diagnosis not present

## 2013-10-05 DIAGNOSIS — M6281 Muscle weakness (generalized): Secondary | ICD-10-CM | POA: Diagnosis not present

## 2013-10-05 DIAGNOSIS — Z4789 Encounter for other orthopedic aftercare: Secondary | ICD-10-CM | POA: Diagnosis not present

## 2013-10-09 DIAGNOSIS — C4441 Basal cell carcinoma of skin of scalp and neck: Secondary | ICD-10-CM | POA: Diagnosis not present

## 2013-10-11 ENCOUNTER — Telehealth: Payer: Self-pay | Admitting: Physician Assistant

## 2013-10-11 DIAGNOSIS — M2550 Pain in unspecified joint: Secondary | ICD-10-CM | POA: Diagnosis not present

## 2013-10-11 DIAGNOSIS — M62838 Other muscle spasm: Secondary | ICD-10-CM | POA: Diagnosis not present

## 2013-10-11 DIAGNOSIS — M6281 Muscle weakness (generalized): Secondary | ICD-10-CM | POA: Diagnosis not present

## 2013-10-11 DIAGNOSIS — Z4789 Encounter for other orthopedic aftercare: Secondary | ICD-10-CM | POA: Diagnosis not present

## 2013-10-11 DIAGNOSIS — M545 Low back pain, unspecified: Secondary | ICD-10-CM | POA: Diagnosis not present

## 2013-10-11 MED ORDER — DICLOFENAC EPOLAMINE 1.3 % TD PTCH: 1.0000 | MEDICATED_PATCH | Freq: Two times a day (BID) | TRANSDERMAL | Status: DC

## 2013-10-11 NOTE — Telephone Encounter (Signed)
Pt informed

## 2013-10-11 NOTE — Telephone Encounter (Signed)
Received PA for Flector, forward to [place]

## 2013-10-11 NOTE — Telephone Encounter (Signed)
Rx sent to [facility]. Please inform patient.

## 2013-10-11 NOTE — Telephone Encounter (Signed)
Requesting flector pain patches be called into [facility] [phone] for neck pain

## 2013-10-11 NOTE — Telephone Encounter (Signed)
Received fax request from pharmacy for PA required on Flector patches/SLS

## 2013-10-12 ENCOUNTER — Other Ambulatory Visit: Payer: Self-pay | Admitting: Physician Assistant

## 2013-10-12 NOTE — Telephone Encounter (Signed)
Currently processing PA.  Will call patient once medication is approved.

## 2013-10-12 NOTE — Telephone Encounter (Signed)
PA request for Approval faxed electronically via Cover My Meds/SLS

## 2013-10-12 NOTE — Telephone Encounter (Signed)
Rx request to pharmacy/SLS  

## 2013-10-15 NOTE — Telephone Encounter (Signed)
PA for Flector patches denied. Patient's medical history does not meet criteria for approval of this medication.  Please inform patient of this and see if she is willing to let us try a similar medication (although it may be in ointment form).

## 2013-10-15 NOTE — Telephone Encounter (Signed)
Leeanne Rio, PA-C at 10/15/2013 7:26 AM     Status: Signed        PA for Flector patches denied. Patient's medical history does not meet criteria for approval of this medication. Please inform patient of this and see if she is willing to let us try a similar medication (although it may be in ointment form).         Rockwell Germany, CMA at 10/12/2013 6:07 PM     Status: Signed        PA request for Approval faxed electronically via Cover My Meds/SLS        Leeanne Rio, PA-C at 10/12/2013 9:05 AM     Status: Signed        Currently processing PA. Will call patient once medication is approved.        Synthia Innocent at 10/11/2013 3:42 PM     Status: Signed        Received PA for Flector, forward to [employer]

## 2013-10-16 DIAGNOSIS — M545 Low back pain, unspecified: Secondary | ICD-10-CM | POA: Diagnosis not present

## 2013-10-16 DIAGNOSIS — Z4789 Encounter for other orthopedic aftercare: Secondary | ICD-10-CM | POA: Diagnosis not present

## 2013-10-16 DIAGNOSIS — M6281 Muscle weakness (generalized): Secondary | ICD-10-CM | POA: Diagnosis not present

## 2013-10-16 DIAGNOSIS — M62838 Other muscle spasm: Secondary | ICD-10-CM | POA: Diagnosis not present

## 2013-10-16 DIAGNOSIS — M2550 Pain in unspecified joint: Secondary | ICD-10-CM | POA: Diagnosis not present

## 2013-10-17 MED ORDER — DICLOFENAC SODIUM 1 % TD GEL: 2.0000 g | Freq: Three times a day (TID) | TRANSDERMAL | Status: DC

## 2013-10-17 NOTE — Telephone Encounter (Signed)
Patient states that she used to receive Flector patches via [facility] x2 years ago via [facility]; explained to patient that Insurance has changed drastically over the past two years and they are not covering medications that they use to. Pt reluctantly states that she will try the Voltaren Gel, as she states that ointments rub off too easily, explained that they are absorbed into skin like the patch; Rx to pharmacy per VO provider/SLS

## 2013-10-17 NOTE — Addendum Note (Signed)
Addended by: Rockwell Germany on: 10/17/2013 07:16 PM   Modules accepted: Orders, Medications

## 2013-10-19 ENCOUNTER — Telehealth: Payer: Self-pay | Admitting: *Deleted

## 2013-10-19 DIAGNOSIS — M542 Cervicalgia: Secondary | ICD-10-CM

## 2013-10-19 DIAGNOSIS — G8929 Other chronic pain: Secondary | ICD-10-CM

## 2013-10-19 DIAGNOSIS — M47812 Spondylosis without myelopathy or radiculopathy, cervical region: Secondary | ICD-10-CM

## 2013-10-19 DIAGNOSIS — M4322 Fusion of spine, cervical region: Secondary | ICD-10-CM

## 2013-10-19 MED ORDER — DICLOFENAC EPOLAMINE 1.3 % TD PTCH: 1.0000 | MEDICATED_PATCH | Freq: Two times a day (BID) | TRANSDERMAL | Status: DC

## 2013-10-19 NOTE — Telephone Encounter (Signed)
Patient called stating that she "had a lengthy conversation with her Insurance yesterday and they now state that they will cover the Flector patch, but that she wants to speak directly w/C[facility] about this information"; she states that she will not be available until this afternoon and she wants Einar Pheasant only to call her/SLS Please Advise.

## 2013-10-19 NOTE — Telephone Encounter (Signed)
Discussed Flector with patient.  Fibromyalgia (placed in EMR by [facility]).  Will attempt appeal of prior authorization denial for medication.

## 2013-10-23 ENCOUNTER — Telehealth: Payer: Self-pay

## 2013-10-23 NOTE — Telephone Encounter (Signed)
Received call from CSR with [facility] (no name was given) needed patient's diagnosis for Flector patch. Per 8/7 phone note Rx given for Fibromyalgia. Also gave dx of neck pain and osteoarthritis. CSR advises that information will be added to current P.A request and we will receive a determination in 24-48 hours.

## 2013-11-01 ENCOUNTER — Telehealth: Payer: Self-pay

## 2013-11-01 DIAGNOSIS — I10 Essential (primary) hypertension: Secondary | ICD-10-CM

## 2013-11-01 MED ORDER — AMLODIPINE BESYLATE 10 MG PO TABS: ORAL_TABLET | ORAL | Status: DC

## 2013-11-01 NOTE — Telephone Encounter (Signed)
Rx sent to pharmacy. Pt is due for f/u. Please call to schedule. Thank you. LDM 

## 2013-11-02 NOTE — Telephone Encounter (Signed)
Left message for patient to return my call.

## 2013-11-05 NOTE — Telephone Encounter (Signed)
Left message for patient to return my call.

## 2013-11-06 NOTE — Telephone Encounter (Signed)
Left message for patient to return my call.

## 2013-11-13 DIAGNOSIS — M6281 Muscle weakness (generalized): Secondary | ICD-10-CM | POA: Diagnosis not present

## 2013-11-13 DIAGNOSIS — M545 Low back pain, unspecified: Secondary | ICD-10-CM | POA: Diagnosis not present

## 2013-11-13 DIAGNOSIS — Z4789 Encounter for other orthopedic aftercare: Secondary | ICD-10-CM | POA: Diagnosis not present

## 2013-11-13 DIAGNOSIS — M62838 Other muscle spasm: Secondary | ICD-10-CM | POA: Diagnosis not present

## 2013-11-13 DIAGNOSIS — M2550 Pain in unspecified joint: Secondary | ICD-10-CM | POA: Diagnosis not present

## 2013-11-21 ENCOUNTER — Telehealth: Payer: Self-pay | Admitting: Physician Assistant

## 2013-11-21 NOTE — Telephone Encounter (Signed)
I nor Cody have either seen and/or faxed any paperwork; please check with P[facility]/SLS Thanks.

## 2013-11-21 NOTE — Telephone Encounter (Signed)
[facility]  ONLY GOT THE LAST 3 PAGES OF A 7 PAGE FAX YOUR SENT  PLEASE RE SEND

## 2013-12-06 ENCOUNTER — Telehealth: Payer: Self-pay

## 2013-12-06 DIAGNOSIS — Z792 Long term (current) use of antibiotics: Secondary | ICD-10-CM

## 2013-12-06 MED ORDER — CEPHALEXIN 500 MG PO CAPS: 1000.0000 mg | ORAL_CAPSULE | Freq: Once | ORAL | Status: DC

## 2013-12-06 NOTE — Telephone Encounter (Signed)
Discussed upcoming dental procedure requiring prophylactic antibiotic.  Rx sent to pharmacy.

## 2013-12-06 NOTE — Telephone Encounter (Signed)
Attempted to reach patient.  LMOM for call back.

## 2013-12-06 NOTE — Telephone Encounter (Signed)
[place] [facility] [phone]  Would like for Einar Pheasant to call her back, she has questions about her medicine

## 2013-12-08 ENCOUNTER — Other Ambulatory Visit: Payer: Self-pay | Admitting: Physician Assistant

## 2013-12-08 DIAGNOSIS — N3281 Overactive bladder: Secondary | ICD-10-CM

## 2013-12-08 DIAGNOSIS — E039 Hypothyroidism, unspecified: Secondary | ICD-10-CM

## 2013-12-13 DIAGNOSIS — M545 Low back pain: Secondary | ICD-10-CM | POA: Diagnosis not present

## 2013-12-13 DIAGNOSIS — M62838 Other muscle spasm: Secondary | ICD-10-CM | POA: Diagnosis not present

## 2013-12-13 DIAGNOSIS — M6281 Muscle weakness (generalized): Secondary | ICD-10-CM | POA: Diagnosis not present

## 2013-12-13 DIAGNOSIS — R293 Abnormal posture: Secondary | ICD-10-CM | POA: Diagnosis not present

## 2013-12-21 ENCOUNTER — Encounter: Payer: Self-pay | Admitting: Physician Assistant

## 2013-12-21 ENCOUNTER — Ambulatory Visit (INDEPENDENT_AMBULATORY_CARE_PROVIDER_SITE_OTHER): Payer: Medicare Other | Admitting: Physician Assistant

## 2013-12-21 VITALS — BP 122/58 | HR 72 | Temp 98.1°F | Resp 14 | Ht 63.0 in | Wt 112.2 lb

## 2013-12-21 DIAGNOSIS — F909 Attention-deficit hyperactivity disorder, unspecified type: Secondary | ICD-10-CM

## 2013-12-21 DIAGNOSIS — F988 Other specified behavioral and emotional disorders with onset usually occurring in childhood and adolescence: Secondary | ICD-10-CM

## 2013-12-21 DIAGNOSIS — G8929 Other chronic pain: Secondary | ICD-10-CM

## 2013-12-21 DIAGNOSIS — F32A Depression, unspecified: Secondary | ICD-10-CM

## 2013-12-21 DIAGNOSIS — I10 Essential (primary) hypertension: Secondary | ICD-10-CM

## 2013-12-21 DIAGNOSIS — M542 Cervicalgia: Secondary | ICD-10-CM | POA: Diagnosis not present

## 2013-12-21 DIAGNOSIS — M47812 Spondylosis without myelopathy or radiculopathy, cervical region: Secondary | ICD-10-CM | POA: Diagnosis not present

## 2013-12-21 DIAGNOSIS — M4322 Fusion of spine, cervical region: Secondary | ICD-10-CM | POA: Diagnosis not present

## 2013-12-21 DIAGNOSIS — F329 Major depressive disorder, single episode, unspecified: Secondary | ICD-10-CM

## 2013-12-21 MED ORDER — DICLOFENAC EPOLAMINE 1.3 % TD PTCH: 1.0000 | MEDICATED_PATCH | Freq: Two times a day (BID) | TRANSDERMAL | Status: DC

## 2013-12-21 MED ORDER — ATOMOXETINE HCL 40 MG PO CAPS: ORAL_CAPSULE | ORAL | Status: DC

## 2013-12-21 NOTE — Patient Instructions (Signed)
Please continue medications as directed.  For the ADD, I have sent in a prescription for Strattera.  Take as directed.  Follow-up with me in 1 month.  Let me know about the [employer] coupon!  Attention Deficit Hyperactivity Disorder Attention deficit hyperactivity disorder (ADHD) is a problem with behavior issues based on the way the brain functions (neurobehavioral disorder). It is a common reason for behavior and academic problems in school. SYMPTOMS  There are 3 types of ADHD. The 3 types and some of the symptoms include:  Inattentive.  Gets bored or distracted easily.  Loses or forgets things. Forgets to hand in homework.  Has trouble organizing or completing tasks.  Difficulty staying on task.  An inability to organize daily tasks and school work.  Leaving projects, chores, or homework unfinished.  Trouble paying attention or responding to details. Careless mistakes.  Difficulty following directions. Often seems like is not listening.  Dislikes activities that require sustained attention (like chores or homework).  Hyperactive-impulsive.  Feels like it is impossible to sit still or stay in a seat. Fidgeting with hands and feet.  Trouble waiting turn.  Talking too much or out of turn. Interruptive.  Speaks or acts impulsively.  Aggressive, disruptive behavior.  Constantly busy or on the go; noisy.  Often leaves seat when they are expected to remain seated.  Often runs or climbs where it is not appropriate, or feels very restless.  Combined.  Has symptoms of both of the above. Often children with ADHD feel discouraged about themselves and with school. They often perform well below their abilities in school. As children get older, the excess motor activities can calm down, but the problems with paying attention and staying organized persist. Most children do not outgrow ADHD but with good treatment can learn to cope with the symptoms. DIAGNOSIS  When ADHD is  suspected, the diagnosis should be made by professionals trained in ADHD. This professional will collect information about the individual suspected of having ADHD. Information must be collected from various settings where the person lives, works, or attends school.  Diagnosis will include:  Confirming symptoms began in childhood.  Ruling out other reasons for the child's behavior.  The health care providers will check with the child's school and check their medical records.  They will talk to teachers and parents.  Behavior rating scales for the child will be filled out by those dealing with the child on a daily basis. A diagnosis is made only after all information has been considered. TREATMENT  Treatment usually includes behavioral treatment, tutoring or extra support in school, and stimulant medicines. Because of the way a person's brain works with ADHD, these medicines decrease impulsivity and hyperactivity and increase attention. This is different than how they would work in a person who does not have ADHD. Other medicines used include antidepressants and certain blood pressure medicines. Most experts agree that treatment for ADHD should address all aspects of the person's functioning. Along with medicines, treatment should include structured classroom management at school. Parents should reward good behavior, provide constant discipline, and set limits. Tutoring should be available for the child as needed. ADHD is a lifelong condition. If untreated, the disorder can have long-term serious effects into adolescence and adulthood. HOME CARE INSTRUCTIONS   Often with ADHD there is a lot of frustration among family members dealing with the condition. Blame and anger are also feelings that are common. In many cases, because the problem affects the family as a whole, the entire  family may need help. A therapist can help the family find better ways to handle the disruptive behaviors of the person  with ADHD and promote change. If the person with ADHD is young, most of the therapist's work is with the parents. Parents will learn techniques for coping with and improving their child's behavior. Sometimes only the child with the ADHD needs counseling. Your health care providers can help you make these decisions.  Children with ADHD may need help learning how to organize. Some helpful tips include:  Keep routines the same every day from wake-up time to bedtime. Schedule all activities, including homework and playtime. Keep the schedule in a place where the person with ADHD will often see it. Mark schedule changes as far in advance as possible.  Schedule outdoor and indoor recreation.  Have a place for everything and keep everything in its place. This includes clothing, backpacks, and school supplies.  Encourage writing down assignments and bringing home needed books. Work with your child's teachers for assistance in organizing school work.  Offer your child a well-balanced diet. Breakfast that includes a balance of whole grains, protein, and fruits or vegetables is especially important for school performance. Children should avoid drinks with caffeine including:  Soft drinks.  Coffee.  Tea.  However, some older children (adolescents) may find these drinks helpful in improving their attention. Because it can also be common for adolescents with ADHD to become addicted to caffeine, talk with your health care provider about what is a safe amount of caffeine intake for your child.  Children with ADHD need consistent rules that they can understand and follow. If rules are followed, give small rewards. Children with ADHD often receive, and expect, criticism. Look for good behavior and praise it. Set realistic goals. Give clear instructions. Look for activities that can foster success and self-esteem. Make time for pleasant activities with your child. Give lots of affection.  Parents are their  children's greatest advocates. Learn as much as possible about ADHD. This helps you become a stronger and better advocate for your child. It also helps you educate your child's teachers and instructors if they feel inadequate in these areas. Parent support groups are often helpful. A national group with local chapters is called Children and Adults with Attention Deficit Hyperactivity Disorder (CHADD). SEEK MEDICAL CARE IF:  Your child has repeated muscle twitches, cough, or speech outbursts.  Your child has sleep problems.  Your child has a marked loss of appetite.  Your child develops depression.  Your child has new or worsening behavioral problems.  Your child develops dizziness.  Your child has a racing heart.  Your child has stomach pains.  Your child develops headaches. SEEK IMMEDIATE MEDICAL CARE IF:  Your child has been diagnosed with depression or anxiety and the symptoms seem to be getting worse.  Your child has been depressed and suddenly appears to have increased energy or motivation.  You are worried that your child is having a bad reaction to a medication he or she is taking for ADHD. Document Released: 02/19/2002 Document Revised: 03/06/2013 Document Reviewed: 11/06/2012 [facility] Patient Information 2015 [place], [place]. This information is not intended to replace advice given to you by your health care provider. Make sure you discuss any questions you have with your health care provider.

## 2013-12-21 NOTE — Progress Notes (Signed)
Pre visit review using our clinic review tool, if applicable. No additional management support is needed unless otherwise documented below in the visit note/SLS  

## 2013-12-21 NOTE — Progress Notes (Signed)
Patient presents to clinic today for follow-up of hypertension and medication management.  Patient currently taking medications as directed .  BP 122/58 in clinic.  Asymptomatic.  Patient wishes to discuss medication management for her adult ADD.  Patient declines to continue seeing her Psychiatrist who has been prescribing medication.  Currently on Wellbutrin 450 mg daily, Cymbalta 30 mg daily and Vyvanse 30 mg daily.  Endorses recurrence of neuropathic pain with decrease in Cymbalta to 30 mg.  Endorses persistent inattentiveness.  Stopped Vyvanse recently due to increase in BP and heart rate.  Has had similar experience with other stimulants previously. Has never tried Oncologist before.  Patient declined flu shot. Past Medical History  Diagnosis Date  . Other tenosynovitis of hand and wrist   . Peripheral neuropathy   . Personal history of alcoholism   . DJD (degenerative joint disease) of hip   . Myalgia and myositis, unspecified   . History of depression   . Morton's neuroma     Hx of, Left  . Carotid bruit     Left  . History of spinal fusion   . Personal history of peptic ulcer disease   . Solitary cyst of breast   . Basal cell carcinoma of face   . Attention deficit disorder without mention of hyperactivity   . Unspecified hypothyroidism   . Benign heart murmur   . Esophageal reflux   . Hypertension   . Pulmonary nodule   . Chronic bronchitis   . Wears glasses   . Abnormal blood pressure     left arm from old brachial artery repair  . Subclavian steal syndrome   . Carotid artery disease   . Atherosclerosis of renal artery   . Centrilobular emphysema     Current Outpatient Prescriptions on File Prior to Visit  Medication Sig Dispense Refill  . amLODipine (NORVASC) 10 MG tablet TAKE 1 TABLET DAILY  30 tablet  0  . aspirin 81 MG tablet Take 81 mg by mouth daily.       Marland Kitchen buPROPion (WELLBUTRIN XL) 300 MG 24 hr tablet TAKE ONE (1) TABLET EACH DAY  30 tablet  2  .  diazepam (VALIUM) 5 MG tablet Take 1 tablet (5 mg total) by mouth every 12 (twelve) hours as needed for anxiety or muscle spasms (use sparingly).  30 tablet  0  . DULoxetine (CYMBALTA) 60 MG capsule TAKE ONE (1) CAPSULE EACH DAY  30 capsule  2  . ferrous sulfate dried (SLOW FE) 160 (50 FE) MG TBCR Take 160 mg by mouth 2 (two) times daily.       . Glucosamine 500 MG TABS Take 1 tablet by mouth 2 (two) times daily. Glucosamine-Chondronton      . L-Methylfolate 15 MG TABS TAKE ONE (1) TABLET EACH DAY  30 tablet  3  . levothyroxine (SYNTHROID, LEVOTHROID) 112 MCG tablet TAKE ONE (1) TABLET BY MOUTH EVERY DAY BEFORE BREAKFAST  90 tablet  1  . oxyCODONE-acetaminophen (ROXICET) 5-325 MG per tablet Take 1 tablet by mouth every 4 (four) hours as needed.  30 tablet  0  . VESICARE 10 MG tablet TAKE ONE (1) TABLET BY MOUTH EVERY DAY  30 tablet  1   No current facility-administered medications on file prior to visit.    Allergies  Allergen Reactions  . Amoxicillin     REACTION: unspecified  . Codeine   . Hydrocodone-Acetaminophen     REACTION: itching  . Hydromorphone Hcl   .  Morphine And Related Hives and Itching  . Morphine Sulfate     REACTION: unspecified    Family History  Problem Relation Age of Onset  . Adopted: Yes  . Schizophrenia Son     Deceased 55  . Hypertension Daughter     Renal  . Healthy Son     x1  . Healthy Daughter     x2    History   Social History  . Marital Status: Single    Spouse Name: N/A    Number of Children: Y  . Years of Education: N/A   Occupational History  . retired     Engineer, maintenance (IT), had own firm   Social History Main Topics  . Smoking status: Former Smoker -- 1.00 packs/day for 25 years    Types: Cigarettes    Quit date: 03/16/1983  . Smokeless tobacco: Never Used  . Alcohol Use: Yes     Comment: occ-hx alcohol abuse  . Drug Use: No  . Sexual Activity: None   Other Topics Concern  . None   Social History Narrative   [employer]  College-accounting      Married '60-83yr divorced; '84-2 years, divorced      3 daughters- '61, '70, '71; 2 sons-'53,'55 (schizophrenic-died); 10 grandchildren      Lives alone with 1 dog and 3 cats      Pt unsure of family history- was adopted.                  Review of Systems - See HPI.  All other ROS are negative.  BP 122/58  Pulse 72  Temp(Src) 98.1 F (36.7 C) (Oral)  Resp 14  Ht 5\' 3"  (1.6 m)  Wt 112 lb 4 oz (50.916 kg)  BMI 19.89 kg/m2  SpO2 98%  Physical Exam  Vitals reviewed. Constitutional: She is oriented to person, place, and time and well-developed, well-nourished, and in no distress.  HENT:  Head: Normocephalic and atraumatic.  Eyes: Conjunctivae are normal.  Cardiovascular: Normal rate, regular rhythm, normal heart sounds and intact distal pulses.   Pulmonary/Chest: Effort normal and breath sounds normal. No respiratory distress. She has no wheezes. She has no rales. She exhibits no tenderness.  Neurological: She is alert and oriented to person, place, and time.  Skin: Skin is warm and dry. No rash noted.  Psychiatric: Affect normal.   Assessment/Plan: Essential hypertension, benign BP normotensive in clinic.  Continue current regimen.  DASH diet handout given.  Will routinely check BP at each visit.  ADD (attention deficit disorder) Will begin Strattera trial. Vyvvanse discontinued.  ADRs discussed with patient.  Follow-up in one month.  Depression Will decrease Wellbutrin XL to 300 mg daily.  Increase Cymbalta back to 60 mg daily.  Follow-up in 1 month.

## 2013-12-26 ENCOUNTER — Telehealth: Payer: Self-pay | Admitting: Physician Assistant

## 2013-12-26 DIAGNOSIS — I10 Essential (primary) hypertension: Secondary | ICD-10-CM

## 2013-12-26 MED ORDER — VALSARTAN 160 MG PO TABS: ORAL_TABLET | ORAL | Status: DC

## 2013-12-26 NOTE — Telephone Encounter (Signed)
Caller name: [place] Relation to pt: Call back number:  [phone] Pharmacy: [facility], [place] - [address]   Reason for call: pt wants a refill on valsartan (DIOVAN) 160 MG tablet

## 2013-12-26 NOTE — Telephone Encounter (Signed)
Refill sent.

## 2013-12-28 ENCOUNTER — Telehealth: Payer: Self-pay | Admitting: *Deleted

## 2013-12-28 NOTE — Telephone Encounter (Signed)
LMOM with contact name and number RE: patient's statement that her Valsartan dosage has been changed from 160 mg to 240 mg daily; informed patient that at her last OV on 10.09.15, there was no mention of this change and we do not have any documentation of this change; so therefore, she will need to have the Physician that made the change [i.e. Cardiology] to authorize the initial refill on this medication to her pharmacy, so that the change can be made in her chart [by a physician], as we again have no documentation and there was no patient reporting of this change prior/SLS

## 2013-12-28 NOTE — Telephone Encounter (Signed)
Caller name: [place] Relation to pt: Call back number: [phone] [facility]  Reason for call:  Pt states that her Rx was increased and she is needing an additional script sent she has to take 240 mg.

## 2013-12-28 NOTE — Telephone Encounter (Signed)
FYI

## 2013-12-28 NOTE — Telephone Encounter (Signed)
Caller name: [place]  Call back number:[phone]   Reason for call:  Returning your call.

## 2013-12-30 NOTE — Assessment & Plan Note (Signed)
Will decrease Wellbutrin XL to 300 mg daily.  Increase Cymbalta back to 60 mg daily.  Follow-up in 1 month.

## 2013-12-30 NOTE — Assessment & Plan Note (Signed)
BP normotensive in clinic.  Continue current regimen.  DASH diet handout given.  Will routinely check BP at each visit.

## 2013-12-30 NOTE — Assessment & Plan Note (Signed)
Will begin Strattera trial. Vyvvanse discontinued.  ADRs discussed with patient.  Follow-up in one month.

## 2013-12-31 MED ORDER — VALSARTAN 80 MG PO TABS: 80.0000 mg | ORAL_TABLET | Freq: Every day | ORAL | Status: DC

## 2013-12-31 NOTE — Addendum Note (Signed)
Addended by: Raiford Noble on: 12/31/2013 05:24 PM   Modules accepted: Orders

## 2013-12-31 NOTE — Telephone Encounter (Signed)
Pt is calling back.  Pt states that Elyn Aquas told her to take the 240mg .  She states he told her to do this via the phone.  Pt is very frustrated and wants to speak with [facility] only.

## 2013-12-31 NOTE — Telephone Encounter (Addendum)
Spoke with patient.  Patient taking 1.5 tablets of the Diovan 160 mg.  Was requesting one Rx for 160 mg tablet and 1 for the 80 mg tablet. BP normotensive at last check on this regimen. Patient checking BP at home and endorses good BP. Medications sent to pharmacy.

## 2013-12-31 NOTE — Addendum Note (Signed)
Addended by: Raiford Noble on: 12/31/2013 03:23 PM   Modules accepted: Medications

## 2014-01-08 ENCOUNTER — Telehealth: Payer: Self-pay | Admitting: Physician Assistant

## 2014-01-08 DIAGNOSIS — E039 Hypothyroidism, unspecified: Secondary | ICD-10-CM

## 2014-01-08 DIAGNOSIS — I10 Essential (primary) hypertension: Secondary | ICD-10-CM

## 2014-01-08 DIAGNOSIS — F988 Other specified behavioral and emotional disorders with onset usually occurring in childhood and adolescence: Secondary | ICD-10-CM

## 2014-01-08 DIAGNOSIS — F32A Depression, unspecified: Secondary | ICD-10-CM

## 2014-01-08 DIAGNOSIS — F329 Major depressive disorder, single episode, unspecified: Secondary | ICD-10-CM

## 2014-01-08 NOTE — Telephone Encounter (Signed)
Caller name: [place]  Relation to pt: self  Call back number: [phone]   Reason for call:   Pt wanted to inform atomoxetine (STRATTERA) 40 MG capsulewould like to up to 60 MG and all medication is now 90 day supply wanted to make you aware.

## 2014-01-08 NOTE — Telephone Encounter (Signed)
Left message on home # to return my call. 

## 2014-01-09 NOTE — Telephone Encounter (Signed)
Per provider, maximum daily dosage is 100 mg; pt is suppose to be taking 40  Mg twice daily according to Rx done at [facility] on 10.09.15: Medication Detail      Disp Refills Start End     atomoxetine (STRATTERA) 40 MG capsule 60 capsule 1 12/21/2013     Sig: Take 1 tablet daily for 1 week. Then increase to 1 tablet twice daily.    E-Prescribing Status: Receipt confirmed by pharmacy (12/21/2013 2:39 PM EDT)    Associated Diagnoses    ADD (attention deficit disorder) - Primary    LMOM with contact name and number for return call RE: medication management per provider instructions/SLS

## 2014-01-09 NOTE — Telephone Encounter (Signed)
Informed patient of provider instructions on daily max dosage on Strattera and she states "then just it 10 extra mg at each dosage and I'll take 100 a day", pt was asked what her reason was for requesting the increase in dosage and replied "no reason, just because, if increased a little more then it work a little more and then if I have side effects from it, we can just decrease it back down again". Patient informed that this message would be forwarded to provider for response.  Will send patient Rx in to pharmacy for 90-day supply with provider approval/SLS

## 2014-01-09 NOTE — Telephone Encounter (Signed)
She needs to come in to discuss this further.  I do not increase doses of medications "just cause".  We need the least amount of medication that allows her to focus.  The patient is currently on 40 mg Straterra twice daily.  The maximum daily dose is 100 mg.  Cannot do 60 mg twice daily.  Her option is for me to increase her Medication to a 100 mg tablet that she takes once daily.  I will send in a 30-day supply of this to her pharmacy.  She needs to come in within the next few weeks of starting this medication for follow-up.  No refills will be given until follow-up.  Please let me know if she would like me to send in the new Rx.

## 2014-01-09 NOTE — Telephone Encounter (Signed)
Pt returned your call please call [phone] (pt apologize she did not here her phone ring and will keep it close)

## 2014-01-10 MED ORDER — VALSARTAN 160 MG PO TABS: ORAL_TABLET | ORAL | Status: DC

## 2014-01-10 MED ORDER — DULOXETINE HCL 60 MG PO CPEP: ORAL_CAPSULE | ORAL | Status: DC

## 2014-01-10 MED ORDER — ATOMOXETINE HCL 100 MG PO CAPS: 100.0000 mg | ORAL_CAPSULE | Freq: Every day | ORAL | Status: DC

## 2014-01-10 MED ORDER — BUPROPION HCL ER (XL) 300 MG PO TB24: ORAL_TABLET | ORAL | Status: DC

## 2014-01-10 NOTE — Telephone Encounter (Signed)
Pt is aware.  

## 2014-01-10 NOTE — Telephone Encounter (Signed)
Pt states that she would like to increase the Straterra to 100 mg tablets in which she will take one tablet once a day.  She has also agreed to come in for a follow up visit after starting this medication.  She would also like all other prescribed medications to be sent into pharmacy as a 90-day supply.    Please advise.

## 2014-01-10 NOTE — Telephone Encounter (Signed)
Done. 90-day supplies granted of medications except new dose of Strattera which is a 30-day supply.

## 2014-01-15 DIAGNOSIS — M6281 Muscle weakness (generalized): Secondary | ICD-10-CM | POA: Diagnosis not present

## 2014-01-15 DIAGNOSIS — M545 Low back pain: Secondary | ICD-10-CM | POA: Diagnosis not present

## 2014-01-15 DIAGNOSIS — M62838 Other muscle spasm: Secondary | ICD-10-CM | POA: Diagnosis not present

## 2014-01-15 DIAGNOSIS — R293 Abnormal posture: Secondary | ICD-10-CM | POA: Diagnosis not present

## 2014-01-22 DIAGNOSIS — M62838 Other muscle spasm: Secondary | ICD-10-CM | POA: Diagnosis not present

## 2014-01-22 DIAGNOSIS — M6281 Muscle weakness (generalized): Secondary | ICD-10-CM | POA: Diagnosis not present

## 2014-01-22 DIAGNOSIS — R293 Abnormal posture: Secondary | ICD-10-CM | POA: Diagnosis not present

## 2014-01-22 DIAGNOSIS — M545 Low back pain: Secondary | ICD-10-CM | POA: Diagnosis not present

## 2014-01-29 DIAGNOSIS — M545 Low back pain: Secondary | ICD-10-CM | POA: Diagnosis not present

## 2014-01-29 DIAGNOSIS — R293 Abnormal posture: Secondary | ICD-10-CM | POA: Diagnosis not present

## 2014-01-29 DIAGNOSIS — M6281 Muscle weakness (generalized): Secondary | ICD-10-CM | POA: Diagnosis not present

## 2014-01-29 DIAGNOSIS — M62838 Other muscle spasm: Secondary | ICD-10-CM | POA: Diagnosis not present

## 2014-01-31 ENCOUNTER — Telehealth: Payer: Self-pay | Admitting: Physician Assistant

## 2014-01-31 DIAGNOSIS — C44319 Basal cell carcinoma of skin of other parts of face: Secondary | ICD-10-CM | POA: Diagnosis not present

## 2014-01-31 DIAGNOSIS — I10 Essential (primary) hypertension: Secondary | ICD-10-CM

## 2014-01-31 DIAGNOSIS — M62838 Other muscle spasm: Secondary | ICD-10-CM | POA: Diagnosis not present

## 2014-01-31 DIAGNOSIS — M545 Low back pain: Secondary | ICD-10-CM | POA: Diagnosis not present

## 2014-01-31 DIAGNOSIS — L570 Actinic keratosis: Secondary | ICD-10-CM | POA: Diagnosis not present

## 2014-01-31 DIAGNOSIS — R293 Abnormal posture: Secondary | ICD-10-CM | POA: Diagnosis not present

## 2014-01-31 DIAGNOSIS — L821 Other seborrheic keratosis: Secondary | ICD-10-CM | POA: Diagnosis not present

## 2014-01-31 DIAGNOSIS — D485 Neoplasm of uncertain behavior of skin: Secondary | ICD-10-CM | POA: Diagnosis not present

## 2014-01-31 DIAGNOSIS — D224 Melanocytic nevi of scalp and neck: Secondary | ICD-10-CM | POA: Diagnosis not present

## 2014-01-31 DIAGNOSIS — L905 Scar conditions and fibrosis of skin: Secondary | ICD-10-CM | POA: Diagnosis not present

## 2014-01-31 DIAGNOSIS — Z85828 Personal history of other malignant neoplasm of skin: Secondary | ICD-10-CM | POA: Diagnosis not present

## 2014-01-31 DIAGNOSIS — M6281 Muscle weakness (generalized): Secondary | ICD-10-CM | POA: Diagnosis not present

## 2014-01-31 DIAGNOSIS — Z08 Encounter for follow-up examination after completed treatment for malignant neoplasm: Secondary | ICD-10-CM | POA: Diagnosis not present

## 2014-01-31 NOTE — Telephone Encounter (Signed)
Caller name: [place] Relation to [id number] Call back number: [phone] Pharmacy: [facility]  Reason for call:   Patient is requesting a refill of gabapentin

## 2014-01-31 NOTE — Telephone Encounter (Signed)
Patient wants  A 90 day supply for all medications. She is requesting a callback regarding this.

## 2014-02-01 MED ORDER — AMLODIPINE BESYLATE 10 MG PO TABS: ORAL_TABLET | ORAL | Status: DC

## 2014-02-01 MED ORDER — GABAPENTIN 600 MG PO TABS: 600.0000 mg | ORAL_TABLET | Freq: Three times a day (TID) | ORAL | Status: DC | PRN

## 2014-02-01 NOTE — Telephone Encounter (Signed)
Medication filled.  

## 2014-02-01 NOTE — Telephone Encounter (Signed)
Ok to send in 30-day supply.  After follow-up once patient is reassessed, can start a 90-day supply

## 2014-02-01 NOTE — Telephone Encounter (Signed)
Please advise. Gabapentin is listed as a historical medication. Also the only remaining prescriptions that would need a refill are her amlodipine and strattera. Please advise if ok to fill these.   Last OV 12-21-13.

## 2014-02-01 NOTE — Telephone Encounter (Signed)
Can send in 90-day of amlodipine.  Rose [name] will remain 30-day for now with no additional refill as she is past due for follow-up.  In terms of the gabapentin, I have never prescribed this. I know patient has neuropathy.  Please assess who has been filling this and ask her for her dose and frequency of use.  Once she verifies all of this, we can send in a prescription.

## 2014-02-01 NOTE — Telephone Encounter (Signed)
Pt scheduled 02-12-14 @ 10;15 am.   Pt states that she takes 600mg  of gabapentin 3 times a day. This was prescribed by provider in [place]. Pt notified that we may not be able to fill this for 90 day supply. Please advise

## 2014-02-04 ENCOUNTER — Other Ambulatory Visit: Payer: Self-pay | Admitting: Physician Assistant

## 2014-02-04 DIAGNOSIS — I10 Essential (primary) hypertension: Secondary | ICD-10-CM

## 2014-02-04 DIAGNOSIS — N3281 Overactive bladder: Secondary | ICD-10-CM

## 2014-02-04 MED ORDER — SOLIFENACIN SUCCINATE 10 MG PO TABS: ORAL_TABLET | ORAL | Status: DC

## 2014-02-04 MED ORDER — VALSARTAN 160 MG PO TABS: ORAL_TABLET | ORAL | Status: DC

## 2014-02-05 DIAGNOSIS — M62838 Other muscle spasm: Secondary | ICD-10-CM | POA: Diagnosis not present

## 2014-02-05 DIAGNOSIS — M545 Low back pain: Secondary | ICD-10-CM | POA: Diagnosis not present

## 2014-02-05 DIAGNOSIS — M6281 Muscle weakness (generalized): Secondary | ICD-10-CM | POA: Diagnosis not present

## 2014-02-05 DIAGNOSIS — R293 Abnormal posture: Secondary | ICD-10-CM | POA: Diagnosis not present

## 2014-02-12 ENCOUNTER — Encounter: Payer: Self-pay | Admitting: Physician Assistant

## 2014-02-12 ENCOUNTER — Ambulatory Visit (INDEPENDENT_AMBULATORY_CARE_PROVIDER_SITE_OTHER): Payer: Medicare Other | Admitting: Physician Assistant

## 2014-02-12 VITALS — BP 127/63 | HR 80 | Temp 97.5°F | Resp 16 | Ht 63.0 in | Wt 110.2 lb

## 2014-02-12 DIAGNOSIS — G609 Hereditary and idiopathic neuropathy, unspecified: Secondary | ICD-10-CM

## 2014-02-12 DIAGNOSIS — F909 Attention-deficit hyperactivity disorder, unspecified type: Secondary | ICD-10-CM

## 2014-02-12 DIAGNOSIS — G629 Polyneuropathy, unspecified: Secondary | ICD-10-CM

## 2014-02-12 DIAGNOSIS — F988 Other specified behavioral and emotional disorders with onset usually occurring in childhood and adolescence: Secondary | ICD-10-CM

## 2014-02-12 MED ORDER — ATOMOXETINE HCL 100 MG PO CAPS: 100.0000 mg | ORAL_CAPSULE | Freq: Every day | ORAL | Status: DC

## 2014-02-12 MED ORDER — OXYCODONE-ACETAMINOPHEN 5-325 MG PO TABS: 1.0000 | ORAL_TABLET | ORAL | Status: DC | PRN

## 2014-02-12 MED ORDER — PREGABALIN 100 MG PO CAPS: 100.0000 mg | ORAL_CAPSULE | Freq: Three times a day (TID) | ORAL | Status: DC

## 2014-02-12 NOTE — Progress Notes (Signed)
Patient presents to clinic today for follow-up of ADD and to discuss medication management for her chronic nerve pain.  Patient currently on Strattera 100 mg QD.  Endorses great improvement in focus.  Denies insomnia or ADR of medication.  Patient also endorsing some side effects gabapentin.  Endorses jitteriness and twitching of legs daily after taking the Gabapentin.  States medication is also now subtherapeutic.  Endorses persistent tingling of legs and feet bilaterally. Is still ambulating well without falls.  Is taking her Cymbalta as directed as well for pain.  Past Medical History  Diagnosis Date  . Other tenosynovitis of hand and wrist   . Peripheral neuropathy   . Personal history of alcoholism   . DJD (degenerative joint disease) of hip   . Myalgia and myositis, unspecified   . History of depression   . Morton's neuroma     Hx of, Left  . Carotid bruit     Left  . History of spinal fusion   . Personal history of peptic ulcer disease   . Solitary cyst of breast   . Basal cell carcinoma of face   . Attention deficit disorder without mention of hyperactivity   . Unspecified hypothyroidism   . Benign heart murmur   . Esophageal reflux   . Hypertension   . Pulmonary nodule   . Chronic bronchitis   . Wears glasses   . Abnormal blood pressure     left arm from old brachial artery repair  . Subclavian steal syndrome   . Carotid artery disease   . Atherosclerosis of renal artery   . Centrilobular emphysema     Current Outpatient Prescriptions on File Prior to Visit  Medication Sig Dispense Refill  . amLODipine (NORVASC) 10 MG tablet TAKE 1 TABLET DAILY 90 tablet 1  . aspirin 81 MG tablet Take 81 mg by mouth daily.     Marland Kitchen buPROPion (WELLBUTRIN XL) 300 MG 24 hr tablet TAKE ONE (1) TABLET EACH DAY 90 tablet 2  . diazepam (VALIUM) 5 MG tablet Take 1 tablet (5 mg total) by mouth every 12 (twelve) hours as needed for anxiety or muscle spasms (use sparingly). 30 tablet 0  .  diclofenac (FLECTOR) 1.3 % PTCH Place 1 patch onto the skin 2 (two) times daily. Dispense one box. 5 patch 0  . DULoxetine (CYMBALTA) 60 MG capsule TAKE ONE (1) CAPSULE EACH DAY 90 capsule 2  . ferrous sulfate dried (SLOW FE) 160 (50 FE) MG TBCR Take 160 mg by mouth 2 (two) times daily.     . Glucosamine 500 MG TABS Take 1 tablet by mouth 2 (two) times daily. Glucosamine-Chondronton    . L-Methylfolate 15 MG TABS TAKE ONE (1) TABLET EACH DAY 30 tablet 3  . levothyroxine (SYNTHROID, LEVOTHROID) 112 MCG tablet TAKE ONE (1) TABLET BY MOUTH EVERY DAY BEFORE BREAKFAST 90 tablet 1  . solifenacin (VESICARE) 10 MG tablet TAKE ONE (1) TABLET BY MOUTH EVERY DAY 90 tablet 1  . valsartan (DIOVAN) 160 MG tablet Take 1.5 tablets by mouth daily. 135 tablet 1   No current facility-administered medications on file prior to visit.    Allergies  Allergen Reactions  . Amoxicillin     REACTION: unspecified  . Codeine   . Hydrocodone-Acetaminophen     REACTION: itching  . Hydromorphone Hcl   . Morphine And Related Hives and Itching  . Morphine Sulfate     REACTION: unspecified    Family History  Problem Relation Age of  Onset  . Adopted: Yes  . Schizophrenia Son     Deceased 54  . Hypertension Daughter     Renal  . Healthy Son     x1  . Healthy Daughter     x2    History   Social History  . Marital Status: Single    Spouse Name: N/A    Number of Children: Y  . Years of Education: N/A   Occupational History  . retired     Engineer, maintenance (IT), had own firm   Social History Main Topics  . Smoking status: Former Smoker -- 1.00 packs/day for 25 years    Types: Cigarettes    Quit date: 03/16/1983  . Smokeless tobacco: Never Used  . Alcohol Use: Yes     Comment: occ-hx alcohol abuse  . Drug Use: No  . Sexual Activity: None   Other Topics Concern  . 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                  Review of Systems - See HPI.  All other ROS are negative.  BP 127/63 mmHg  Pulse 80  Temp(Src) 97.5 F (36.4 C) (Oral)  Resp 16  Ht 5\' 3"  (1.6 m)  Wt 110 lb 4 oz (50.009 kg)  BMI 19.53 kg/m2  SpO2 99%  Physical Exam  Constitutional: She is oriented to person, place, and time and well-developed, well-nourished, and in no distress.  HENT:  Head: Normocephalic and atraumatic.  Eyes: Conjunctivae are normal.  Cardiovascular: Normal rate, regular rhythm, normal heart sounds and intact distal pulses.   Pulmonary/Chest: Effort normal and breath sounds normal.  Neurological: She is alert and oriented to person, place, and time. No cranial nerve deficit.  Skin: Skin is warm and dry. No rash noted.  Vitals reviewed.  Assessment/Plan: Hereditary and idiopathic peripheral neuropathy Will stop Gabapentin.  Continue Cymbalta.  Will begin Lyrica 100 mg TID. Referral placed to Neurology for nerve conduction studies.  ADD (attention deficit disorder) Doing very well.  Continue current regimen. Medications refilled.  Follow-up in 3 months.

## 2014-02-12 NOTE — Patient Instructions (Signed)
Please stop the Gabapentin and begin the Lyrica three times daily.  Continue other medications as directed.  You will be contacted by Neurology for further assessment and nerve conduction studies.  Follow-up with me in 3 months.

## 2014-02-12 NOTE — Progress Notes (Signed)
Pre visit review using our clinic review tool, if applicable. No additional management support is needed unless otherwise documented below in the visit note/SLS  

## 2014-02-12 NOTE — Assessment & Plan Note (Signed)
Will stop Gabapentin.  Continue Cymbalta.  Will begin Lyrica 100 mg TID. Referral placed to Neurology for nerve conduction studies.

## 2014-02-12 NOTE — Assessment & Plan Note (Signed)
Doing very well.  Continue current regimen. Medications refilled.  Follow-up in 3 months.

## 2014-02-14 DIAGNOSIS — M6281 Muscle weakness (generalized): Secondary | ICD-10-CM | POA: Diagnosis not present

## 2014-02-14 DIAGNOSIS — M62838 Other muscle spasm: Secondary | ICD-10-CM | POA: Diagnosis not present

## 2014-02-14 DIAGNOSIS — M545 Low back pain: Secondary | ICD-10-CM | POA: Diagnosis not present

## 2014-02-14 DIAGNOSIS — R293 Abnormal posture: Secondary | ICD-10-CM | POA: Diagnosis not present

## 2014-02-19 DIAGNOSIS — M62838 Other muscle spasm: Secondary | ICD-10-CM | POA: Diagnosis not present

## 2014-02-19 DIAGNOSIS — M6281 Muscle weakness (generalized): Secondary | ICD-10-CM | POA: Diagnosis not present

## 2014-02-19 DIAGNOSIS — R293 Abnormal posture: Secondary | ICD-10-CM | POA: Diagnosis not present

## 2014-02-19 DIAGNOSIS — M545 Low back pain: Secondary | ICD-10-CM | POA: Diagnosis not present

## 2014-02-21 DIAGNOSIS — M62838 Other muscle spasm: Secondary | ICD-10-CM | POA: Diagnosis not present

## 2014-02-21 DIAGNOSIS — M6281 Muscle weakness (generalized): Secondary | ICD-10-CM | POA: Diagnosis not present

## 2014-02-21 DIAGNOSIS — R293 Abnormal posture: Secondary | ICD-10-CM | POA: Diagnosis not present

## 2014-02-21 DIAGNOSIS — M545 Low back pain: Secondary | ICD-10-CM | POA: Diagnosis not present

## 2014-03-04 ENCOUNTER — Other Ambulatory Visit: Payer: Self-pay | Admitting: Physician Assistant

## 2014-03-04 NOTE — Telephone Encounter (Signed)
Patient requesting a refill of vesicare 90 day supply. [facility]. Patient is out of this medication

## 2014-03-04 NOTE — Telephone Encounter (Signed)
Spoke with Rose [name] at [facility].  She shared that Rx was received, however, although 90-day supply prescribed, insurance will only cover 30 tablets at a time.  Therefore, only 30 tablets are dispensed each time.

## 2014-03-04 NOTE — Telephone Encounter (Signed)
I sent in a 90-day supply one month ago. Please call pharmacy to verify they dispensed a 90-day supply.

## 2014-03-19 DIAGNOSIS — R293 Abnormal posture: Secondary | ICD-10-CM | POA: Diagnosis not present

## 2014-03-19 DIAGNOSIS — M545 Low back pain: Secondary | ICD-10-CM | POA: Diagnosis not present

## 2014-03-19 DIAGNOSIS — M6281 Muscle weakness (generalized): Secondary | ICD-10-CM | POA: Diagnosis not present

## 2014-03-19 DIAGNOSIS — M62838 Other muscle spasm: Secondary | ICD-10-CM | POA: Diagnosis not present

## 2014-03-25 ENCOUNTER — Telehealth: Payer: Self-pay | Admitting: Physician Assistant

## 2014-03-26 DIAGNOSIS — M545 Low back pain: Secondary | ICD-10-CM | POA: Diagnosis not present

## 2014-03-26 DIAGNOSIS — R293 Abnormal posture: Secondary | ICD-10-CM | POA: Diagnosis not present

## 2014-03-26 DIAGNOSIS — M6281 Muscle weakness (generalized): Secondary | ICD-10-CM | POA: Diagnosis not present

## 2014-03-26 DIAGNOSIS — M62838 Other muscle spasm: Secondary | ICD-10-CM | POA: Diagnosis not present

## 2014-03-27 ENCOUNTER — Telehealth: Payer: Self-pay | Admitting: Physician Assistant

## 2014-03-27 DIAGNOSIS — F329 Major depressive disorder, single episode, unspecified: Secondary | ICD-10-CM

## 2014-03-27 DIAGNOSIS — F32A Depression, unspecified: Secondary | ICD-10-CM

## 2014-03-27 MED ORDER — DIAZEPAM 5 MG PO TABS: 5.0000 mg | ORAL_TABLET | Freq: Two times a day (BID) | ORAL | Status: DC | PRN

## 2014-03-27 NOTE — Telephone Encounter (Signed)
Caller name: Alany Relation to pt: self Call back number: [phone] Pharmacy: [facility]  Reason for call:   Requesting diazepam refill

## 2014-03-27 NOTE — Telephone Encounter (Signed)
Rx refill granted.  Order placed. Please phone in or fax to pharmacy.

## 2014-03-27 NOTE — Telephone Encounter (Signed)
Medication Detail      Disp Refills Start End     diazepam (VALIUM) 5 MG tablet 30 tablet 0 09/21/2013     Sig - Route: Take 1 tablet (5 mg total) by mouth every 12 (twelve) hours as needed for anxiety or muscle spasms (use sparingly). - Oral    Class: Print

## 2014-03-28 DIAGNOSIS — M545 Low back pain: Secondary | ICD-10-CM | POA: Diagnosis not present

## 2014-03-28 DIAGNOSIS — M62838 Other muscle spasm: Secondary | ICD-10-CM | POA: Diagnosis not present

## 2014-03-28 DIAGNOSIS — M6281 Muscle weakness (generalized): Secondary | ICD-10-CM | POA: Diagnosis not present

## 2014-03-28 DIAGNOSIS — R293 Abnormal posture: Secondary | ICD-10-CM | POA: Diagnosis not present

## 2014-03-28 NOTE — Telephone Encounter (Signed)
Medication called in to [facility]. JG//CMA

## 2014-03-29 ENCOUNTER — Ambulatory Visit: Payer: Medicare Other | Admitting: Physician Assistant

## 2014-03-29 ENCOUNTER — Telehealth: Payer: Self-pay | Admitting: *Deleted

## 2014-03-29 NOTE — Telephone Encounter (Signed)
yes

## 2014-03-29 NOTE — Telephone Encounter (Signed)
Pt did not show for appointment 03/29/2014 at 3:00pm for BP check.   Pt called day of appointment and rescheduled for 04/02/14.  Charge no show fee?

## 2014-04-01 ENCOUNTER — Ambulatory Visit: Payer: Medicare Other | Admitting: Neurology

## 2014-04-02 ENCOUNTER — Ambulatory Visit: Payer: Medicare Other | Admitting: Physician Assistant

## 2014-04-02 DIAGNOSIS — R293 Abnormal posture: Secondary | ICD-10-CM | POA: Diagnosis not present

## 2014-04-02 DIAGNOSIS — M62838 Other muscle spasm: Secondary | ICD-10-CM | POA: Diagnosis not present

## 2014-04-02 DIAGNOSIS — M545 Low back pain: Secondary | ICD-10-CM | POA: Diagnosis not present

## 2014-04-02 DIAGNOSIS — M6281 Muscle weakness (generalized): Secondary | ICD-10-CM | POA: Diagnosis not present

## 2014-04-03 NOTE — Telephone Encounter (Signed)
See note below

## 2014-04-05 ENCOUNTER — Ambulatory Visit: Payer: Medicare Other | Admitting: Physician Assistant

## 2014-04-08 ENCOUNTER — Telehealth: Payer: Self-pay | Admitting: Physician Assistant

## 2014-04-08 NOTE — Telephone Encounter (Signed)
Spoke with the PT and she states that the RX filled on 03/28/14 is the one that she lost-

## 2014-04-08 NOTE — Telephone Encounter (Signed)
Caller name:Cammy Beedy  Relationship to patient: Self Can be reached:[phone]   Reason for call: PT states that she lost the RX diazepam (VALIUM) 5 MG tablet / stated she lost about a week ago. She is requesting it be refilled.

## 2014-04-08 NOTE — Telephone Encounter (Signed)
Please inform patient that she No Show for her 03/27/14, 04/02/14, & 04/05/14 appointments, so therefore, she would not have a prescription to lose, the prescription was Phoned In to [facility] on 01.14.16 per EMR: Medication Detail      Disp Refills Start End     diazepam (VALIUM) 5 MG tablet 30 tablet 0 03/27/2014     Sig - Route: Take 1 tablet (5 mg total) by mouth every 12 (twelve) hours as needed for anxiety or muscle spasms (use sparingly). - Oral    Class: Phone In        [place], [place] - [id number] [place], CMA at 03/28/2014 1:30 PM     Status: Signed       Expand All Collapse All   Medication called in to [facility]. JG//CMA

## 2014-04-08 NOTE — Telephone Encounter (Signed)
Patient informed that without a police report that medication was stolen, per [facility] regulations, controlled substances cannot be filled early; pt needs to sign CSC, but again has No Show for last [3] appointments [due in office Fri, 01.29.16]. Patient understood & agreed/SLS

## 2014-04-09 ENCOUNTER — Telehealth: Payer: Self-pay | Admitting: Physician Assistant

## 2014-04-09 ENCOUNTER — Other Ambulatory Visit: Payer: Self-pay | Admitting: Physician Assistant

## 2014-04-09 NOTE — Telephone Encounter (Signed)
Medication Detail      Disp Refills Start End     atomoxetine (STRATTERA) 100 MG capsule 30 capsule 1 02/12/2014     Sig - Route: Take 1 capsule (100 mg total) by mouth daily. - Oral    E-Prescribing Status: Receipt confirmed by pharmacy (02/12/2014 10:48 AM EST)      Patient is not due for refill until April 13, 2014 of Controlled medication; has No Show/Canceled past [3] appointments, will Hold for pt's scheduled Fri, 01.29.16 appointment scheduled/SLS

## 2014-04-09 NOTE — Telephone Encounter (Signed)
Caller name: Zariana Relation to pt: self Call back number: [phone] Pharmacy:  Reason for call:   Patient would like [place] to research the drug atomoxitine. She states that this works will in place of ritalin.

## 2014-04-09 NOTE — Telephone Encounter (Signed)
Patient states that she was wrong about atomoxine. Look at clonidine.

## 2014-04-10 NOTE — Telephone Encounter (Signed)
We can discuss at her visit Friday, although we have discussed this several times already.

## 2014-04-12 ENCOUNTER — Encounter: Payer: Self-pay | Admitting: Physician Assistant

## 2014-04-12 ENCOUNTER — Ambulatory Visit (INDEPENDENT_AMBULATORY_CARE_PROVIDER_SITE_OTHER): Payer: Medicare Other | Admitting: Physician Assistant

## 2014-04-12 VITALS — BP 145/59 | HR 67 | Temp 97.7°F | Resp 14 | Ht 63.0 in | Wt 113.1 lb

## 2014-04-12 DIAGNOSIS — F988 Other specified behavioral and emotional disorders with onset usually occurring in childhood and adolescence: Secondary | ICD-10-CM

## 2014-04-12 DIAGNOSIS — I10 Essential (primary) hypertension: Secondary | ICD-10-CM | POA: Diagnosis not present

## 2014-04-12 DIAGNOSIS — F909 Attention-deficit hyperactivity disorder, unspecified type: Secondary | ICD-10-CM

## 2014-04-12 MED ORDER — AMLODIPINE BESYLATE 10 MG PO TABS: 5.0000 mg | ORAL_TABLET | Freq: Every day | ORAL | Status: DC

## 2014-04-12 MED ORDER — CLONIDINE HCL 0.1 MG PO TABS: 0.1000 mg | ORAL_TABLET | Freq: Two times a day (BID) | ORAL | Status: DC

## 2014-04-12 NOTE — Progress Notes (Signed)
Pre visit review using our clinic review tool, if applicable. No additional management support is needed unless otherwise documented below in the visit note/SLS  

## 2014-04-12 NOTE — Telephone Encounter (Signed)
Rx request to pharmacy/SLS  

## 2014-04-12 NOTE — Patient Instructions (Signed)
Please decrease Amlodipine to 5 mg (1/2 tablet) daily.  Use clonidine as directed.  Continue other medications as directed.  Keep a BP log.  Check twice daily.  Follow-up in 2 weeks for BP check with nurse. If you notice BP < 100/60, call the office.    Follow-up with me in 1-2 months.

## 2014-04-14 NOTE — Assessment & Plan Note (Signed)
Continue Diovan as directed. Will decrease amlodipine to 5 mg daily.  Clonidine to be used BID as directed.  Continue BP journal.  Bring to follow-up in 2 weeks.  ADRs of medications discussed. She is to stop Clonidine if any of these are noticed.

## 2014-04-14 NOTE — Progress Notes (Signed)
Patient presents to clinic today for follow-up of ADD and HTN.  Patient endorses ADD regimen remains sub-therapeutic.  Patient was unable to remain on stimulant therapy due to uncontrolled HTN and tachycardia. Was switched to [facility] which helped some but symptoms remain.  Would like to discuss Clonidine as a potential medication to treat both ADD and HTN.  Past Medical History  Diagnosis Date  . Other tenosynovitis of hand and wrist   . Peripheral neuropathy   . Personal history of alcoholism   . DJD (degenerative joint disease) of hip   . Myalgia and myositis, unspecified   . History of depression   . Morton's neuroma     Hx of, Left  . Carotid bruit     Left  . History of spinal fusion   . Personal history of peptic ulcer disease   . Solitary cyst of breast   . Basal cell carcinoma of face   . Attention deficit disorder without mention of hyperactivity   . Unspecified hypothyroidism   . Benign heart murmur   . Esophageal reflux   . Hypertension   . Pulmonary nodule   . Chronic bronchitis   . Wears glasses   . Abnormal blood pressure     left arm from old brachial artery repair  . Subclavian steal syndrome   . Carotid artery disease   . Atherosclerosis of renal artery   . Centrilobular emphysema     Current Outpatient Prescriptions on File Prior to Visit  Medication Sig Dispense Refill  . aspirin 81 MG tablet Take 81 mg by mouth daily.     Marland Kitchen buPROPion (WELLBUTRIN XL) 300 MG 24 hr tablet TAKE ONE (1) TABLET EACH DAY 90 tablet 2  . diazepam (VALIUM) 5 MG tablet Take 1 tablet (5 mg total) by mouth every 12 (twelve) hours as needed for anxiety or muscle spasms (use sparingly). 30 tablet 0  . diclofenac (FLECTOR) 1.3 % PTCH Place 1 patch onto the skin 2 (two) times daily. Dispense one box. 5 patch 0  . DULoxetine (CYMBALTA) 60 MG capsule TAKE ONE (1) CAPSULE EACH DAY 90 capsule 2  . ferrous sulfate dried (SLOW FE) 160 (50 FE) MG TBCR Take 160 mg by mouth 2 (two) times  daily.     . Glucosamine 500 MG TABS Take 1 tablet by mouth 2 (two) times daily. Glucosamine-Chondronton    . L-Methylfolate 15 MG TABS TAKE ONE (1) TABLET EACH DAY 30 tablet 3  . levothyroxine (SYNTHROID, LEVOTHROID) 112 MCG tablet TAKE ONE (1) TABLET BY MOUTH EVERY DAY BEFORE BREAKFAST 90 tablet 1  . oxyCODONE-acetaminophen (ROXICET) 5-325 MG per tablet Take 1 tablet by mouth every 4 (four) hours as needed. 60 tablet 0  . pregabalin (LYRICA) 100 MG capsule Take 1 capsule (100 mg total) by mouth 3 (three) times daily. 90 capsule 1  . solifenacin (VESICARE) 10 MG tablet TAKE ONE (1) TABLET BY MOUTH EVERY DAY 90 tablet 1  . valsartan (DIOVAN) 160 MG tablet Take 1.5 tablets by mouth daily. 135 tablet 1  . STRATTERA 100 MG capsule TAKE ONE CAPSULE BY MOUTH DAILY 30 capsule 1   No current facility-administered medications on file prior to visit.    Allergies  Allergen Reactions  . Amoxicillin     REACTION: unspecified  . Codeine   . Hydrocodone-Acetaminophen     REACTION: itching  . Hydromorphone Hcl   . Morphine And Related Hives and Itching  . Morphine Sulfate  REACTION: unspecified    Family History  Problem Relation Age of Onset  . Adopted: Yes  . Schizophrenia Son     Deceased 91  . Hypertension Daughter     Renal  . Healthy Son     x1  . Healthy Daughter     x2    History   Social History  . Marital Status: Single    Spouse Name: N/A    Number of Children: Y  . Years of Education: N/A   Occupational History  . retired     Engineer, maintenance (IT), had own firm   Social History Main Topics  . Smoking status: Former Smoker -- 1.00 packs/day for 25 years    Types: Cigarettes    Quit date: 03/16/1983  . Smokeless tobacco: Never Used  . Alcohol Use: Yes     Comment: occ-hx alcohol abuse  . Drug Use: No  . Sexual Activity: None   Other Topics Concern  . 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                   Review of Systems - See HPI.  All other ROS are negative.  BP 145/59 mmHg  Pulse 67  Temp(Src) 97.7 F (36.5 C) (Oral)  Resp 14  Ht 5\' 3"  (1.6 m)  Wt 113 lb 2 oz (51.313 kg)  BMI 20.04 kg/m2  SpO2 94%  Physical Exam  Constitutional: She is oriented to person, place, and time and well-developed, well-nourished, and in no distress.  HENT:  Head: Normocephalic and atraumatic.  Cardiovascular: Normal rate, regular rhythm, normal heart sounds and intact distal pulses.   Pulmonary/Chest: Effort normal and breath sounds normal. No respiratory distress. She has no wheezes. She has no rales. She exhibits no tenderness.  Neurological: She is alert and oriented to person, place, and time.  Skin: Skin is warm. No rash noted.  Psychiatric: Affect normal.  Vitals reviewed.    Assessment/Plan: Essential hypertension, benign Continue Diovan as directed. Will decrease amlodipine to 5 mg daily.  Clonidine to be used BID as directed.  Continue BP journal.  Bring to follow-up in 2 weeks.  ADRs of medications discussed. She is to stop Clonidine if any of these are noticed.   ADD (attention deficit disorder) Long discussion with patient about ADD medications suitable for adults.  Since patient also has significant HTN I will be willing to attempt a 2-week trial of Clonidine BID for ADD symptoms.  Instructions on BP monitoring discussed with patient.  Follow-up 2 weeks.

## 2014-04-14 NOTE — Assessment & Plan Note (Signed)
Long discussion with patient about ADD medications suitable for adults.  Since patient also has significant HTN I will be willing to attempt a 2-week trial of Clonidine BID for ADD symptoms.  Instructions on BP monitoring discussed with patient.  Follow-up 2 weeks.

## 2014-04-30 ENCOUNTER — Telehealth: Payer: Self-pay | Admitting: Physician Assistant

## 2014-04-30 NOTE — Telephone Encounter (Signed)
Caller name:[place], [place] E Relation to pt: self  Call back [phone]:   Reason for call:  Pt would like to discuss her BP being high in the morning in need of clinical advice

## 2014-05-01 NOTE — Telephone Encounter (Signed)
If she is taking the amlodipine in the morning, I recommend she take it at night instead.  Check BP 1 hour after morning medications.  Follow-up next week.

## 2014-05-01 NOTE — Telephone Encounter (Signed)
LMOM to return call.

## 2014-05-01 NOTE — Telephone Encounter (Signed)
Patient states that she will try change in medication regimen (amlodipine at night) and clarified that she is actually taking 5mg  of amlodipine daily.  Patient refuses f/u visit stating she will call if she is still having problems. However, she requests an MRI of the left hip.

## 2014-05-01 NOTE — Telephone Encounter (Signed)
Patient states her BPs have been  861-483 systolic for last two morning.  Diastolic has been in 07P-54N.  She reports that her BP improves throughout the day to 014-840 systolic.  She reports no symptoms, no headaches, blurred vision, sob.  She is taking both the clonidine and the amlodipine 10mg .    Please advise.

## 2014-05-01 NOTE — Telephone Encounter (Signed)
Patient overdue for a BP recheck in office (was supposed to follow-up in 2 weeks). Please assess BP averages and if any symptoms are present. See if she is taking her Clonidine twice daily and verify she is only taking 5 mg of amlodipine.

## 2014-05-01 NOTE — Telephone Encounter (Signed)
Patient returned phone call. °

## 2014-05-01 NOTE — Telephone Encounter (Signed)
Apt scheduled for 05/03/14 at 3:15.

## 2014-05-01 NOTE — Telephone Encounter (Signed)
Not without appointment

## 2014-05-03 ENCOUNTER — Ambulatory Visit: Payer: Medicare Other | Admitting: Physician Assistant

## 2014-05-07 ENCOUNTER — Ambulatory Visit: Payer: Medicare Other | Admitting: Neurology

## 2014-05-10 ENCOUNTER — Telehealth: Payer: Self-pay | Admitting: Physician Assistant

## 2014-05-10 DIAGNOSIS — I10 Essential (primary) hypertension: Secondary | ICD-10-CM

## 2014-05-10 DIAGNOSIS — E039 Hypothyroidism, unspecified: Secondary | ICD-10-CM

## 2014-05-10 DIAGNOSIS — Z0000 Encounter for general adult medical examination without abnormal findings: Secondary | ICD-10-CM

## 2014-05-10 NOTE — Telephone Encounter (Signed)
Caller name: Joelene Millin Relationship to patient: [employer]  Can be reached:[phone]   Reason for call: Arrie Eastern @ [employer] waiting on fax back regarding application for residency. Faxed over request for medical records on 05/08/14. Fax # [phone]

## 2014-05-13 ENCOUNTER — Telehealth: Payer: Self-pay | Admitting: Neurology

## 2014-05-13 NOTE — Telephone Encounter (Signed)
Forms have been completed.  Unfortunately they require her to have a PPD test result taken in the past months.  Also some blood work.  The most recent blood work and PPD are too old to be used. Please call patient to come in for PPD test and labs -- needs CBC, UA and CMP.  Diagnosis -- Preventive Measure.  Once I have this, I can finish forms and fax right over.

## 2014-05-13 NOTE — Telephone Encounter (Signed)
Patient informed, understood & agreed, scheduled lab appt for Tues, 03.01.16at 9:30am, pt requested TSH also, provider informed/SLS

## 2014-05-13 NOTE — Telephone Encounter (Signed)
Pt no showed 05/07/14 NP appt w/ Dr. Posey Pronto. Referring provider notified via EPIC referral. No show letter + no show policy mailed to pt / Sherri S.

## 2014-05-14 ENCOUNTER — Other Ambulatory Visit (INDEPENDENT_AMBULATORY_CARE_PROVIDER_SITE_OTHER): Payer: Medicare Other

## 2014-05-14 DIAGNOSIS — I10 Essential (primary) hypertension: Secondary | ICD-10-CM | POA: Diagnosis not present

## 2014-05-14 DIAGNOSIS — E039 Hypothyroidism, unspecified: Secondary | ICD-10-CM

## 2014-05-14 DIAGNOSIS — Z0000 Encounter for general adult medical examination without abnormal findings: Secondary | ICD-10-CM | POA: Diagnosis not present

## 2014-05-14 DIAGNOSIS — Z23 Encounter for immunization: Secondary | ICD-10-CM

## 2014-05-14 LAB — URINALYSIS, ROUTINE W REFLEX MICROSCOPIC
BILIRUBIN URINE: NEGATIVE
Ketones, ur: NEGATIVE
NITRITE: NEGATIVE
Specific Gravity, Urine: 1.02 (ref 1.000–1.030)
TOTAL PROTEIN, URINE-UPE24: NEGATIVE
UROBILINOGEN UA: 0.2 (ref 0.0–1.0)
Urine Glucose: NEGATIVE
pH: 6 (ref 5.0–8.0)

## 2014-05-14 LAB — CBC
HCT: 37 % (ref 36.0–46.0)
Hemoglobin: 12.6 g/dL (ref 12.0–15.0)
MCHC: 34 g/dL (ref 30.0–36.0)
MCV: 97.1 fl (ref 78.0–100.0)
Platelets: 241 10*3/uL (ref 150.0–400.0)
RBC: 3.81 Mil/uL — AB (ref 3.87–5.11)
RDW: 13.1 % (ref 11.5–15.5)
WBC: 5.1 10*3/uL (ref 4.0–10.5)

## 2014-05-14 LAB — COMPREHENSIVE METABOLIC PANEL
ALT: 12 U/L (ref 0–35)
AST: 15 U/L (ref 0–37)
Albumin: 4.3 g/dL (ref 3.5–5.2)
Alkaline Phosphatase: 63 U/L (ref 39–117)
BILIRUBIN TOTAL: 0.3 mg/dL (ref 0.2–1.2)
BUN: 18 mg/dL (ref 6–23)
CO2: 32 mEq/L (ref 19–32)
CREATININE: 1.05 mg/dL (ref 0.40–1.20)
Calcium: 9.9 mg/dL (ref 8.4–10.5)
Chloride: 105 mEq/L (ref 96–112)
GFR: 54.22 mL/min — ABNORMAL LOW (ref >60.00)
Glucose, Bld: 82 mg/dL (ref 70–99)
Potassium: 4.2 mEq/L (ref 3.5–5.1)
Sodium: 139 mEq/L (ref 135–145)
Total Protein: 7.2 g/dL (ref 6.0–8.3)

## 2014-05-14 LAB — TSH: TSH: 1.94 u[IU]/mL (ref 0.35–4.50)

## 2014-05-14 NOTE — Addendum Note (Signed)
Addended by: Rockwell Germany on: 05/14/2014 09:52 AM   Modules accepted: Orders

## 2014-05-16 NOTE — Telephone Encounter (Signed)
Waiting on PPD to be resulted first.

## 2014-05-16 NOTE — Telephone Encounter (Addendum)
Caller name: Joelene Millin  Relation to pt: [employer]  Call back number: [phone]  Reason for call:  Following up on request below please fax to Fax [phone]

## 2014-05-17 ENCOUNTER — Ambulatory Visit: Payer: Medicare Other | Admitting: *Deleted

## 2014-05-17 MED ORDER — TUBERCULIN PPD 5 UNIT/0.1ML ID SOLN
5.0000 [IU] | Freq: Once | INTRADERMAL | Status: AC
  Administered 2014-05-17: 5 [IU] via INTRADERMAL

## 2014-05-17 NOTE — Progress Notes (Signed)
Pre visit review using our clinic review tool, if applicable. No additional management support is needed unless otherwise documented below in the visit note. 

## 2014-05-17 NOTE — Telephone Encounter (Signed)
Patient came for nurse visit, new PPD placed.  eal

## 2014-05-17 NOTE — Telephone Encounter (Signed)
It seems patient never came back to have her PPD read.  Is out of the window where it will be valid.  She will need to come back in for another PPD placement next week.  Please let the [employer] rep know that patient noncompliance is the reason for the delayed fax.

## 2014-05-20 ENCOUNTER — Other Ambulatory Visit (INDEPENDENT_AMBULATORY_CARE_PROVIDER_SITE_OTHER): Payer: Medicare Other

## 2014-05-20 ENCOUNTER — Encounter: Payer: Self-pay | Admitting: *Deleted

## 2014-05-20 DIAGNOSIS — R319 Hematuria, unspecified: Secondary | ICD-10-CM

## 2014-05-20 LAB — URINALYSIS, ROUTINE W REFLEX MICROSCOPIC
Bilirubin Urine: NEGATIVE
Hgb urine dipstick: NEGATIVE
Ketones, ur: NEGATIVE
LEUKOCYTES UA: NEGATIVE
NITRITE: NEGATIVE
RBC / HPF: NONE SEEN (ref 0–?)
SPECIFIC GRAVITY, URINE: 1.02 (ref 1.000–1.030)
Total Protein, Urine: NEGATIVE
URINE GLUCOSE: NEGATIVE
UROBILINOGEN UA: 0.2 (ref 0.0–1.0)
WBC, UA: NONE SEEN (ref 0–?)
pH: 6 (ref 5.0–8.0)

## 2014-05-20 LAB — TB SKIN TEST
Induration: 0 mm
TB Skin Test: NEGATIVE

## 2014-05-21 LAB — CULTURE, URINE COMPREHENSIVE
Colony Count: NO GROWTH
ORGANISM ID, BACTERIA: NO GROWTH

## 2014-05-24 ENCOUNTER — Ambulatory Visit (INDEPENDENT_AMBULATORY_CARE_PROVIDER_SITE_OTHER): Payer: Medicare Other | Admitting: Physician Assistant

## 2014-05-24 ENCOUNTER — Encounter: Payer: Self-pay | Admitting: Physician Assistant

## 2014-05-24 VITALS — BP 110/53 | HR 77 | Temp 97.7°F | Resp 14 | Ht 63.0 in | Wt 111.0 lb

## 2014-05-24 DIAGNOSIS — F32A Depression, unspecified: Secondary | ICD-10-CM

## 2014-05-24 DIAGNOSIS — G609 Hereditary and idiopathic neuropathy, unspecified: Secondary | ICD-10-CM

## 2014-05-24 DIAGNOSIS — F329 Major depressive disorder, single episode, unspecified: Secondary | ICD-10-CM

## 2014-05-24 MED ORDER — DIAZEPAM 5 MG PO TABS: 5.0000 mg | ORAL_TABLET | Freq: Two times a day (BID) | ORAL | Status: DC | PRN

## 2014-05-24 NOTE — Progress Notes (Signed)
Pre visit review using our clinic review tool, if applicable. No additional management support is needed unless otherwise documented below in the visit note/SLS  

## 2014-05-27 NOTE — Telephone Encounter (Signed)
Late entry.  Forms faxed.

## 2014-05-27 NOTE — Assessment & Plan Note (Signed)
Unfortunately only some of her records have been received for review. Record request made to for [facility] and Dr. Matthew Saras. It seems that she had a EMG and nerve conduction study about 10 years ago that did show some abnormalities. Informed patient that given how long it has been since studies, and her persistent symptoms, recommend that she follow-up with her neurologist, Dr. Posey Pronto, for further evaluation. Has upcoming appointment with sports medicine for an ultrasound of her left hip. Encouraged patient to keep that appointment. Continue Lyrica as directed. Patient refuses to increase dose of this medicine.

## 2014-05-27 NOTE — Progress Notes (Signed)
Patient presents to clinic today to go through her old neuro surgery records from her hip replacement back in 2005.  Patient endorses bilateral hip pain with burning, tingling and weakness since that time. Has significant history of polyneuropathy, currently treated with Cymbalta and Lyrica. Is wondering if she should see a specialist again.  Past Medical History  Diagnosis Date  . Other tenosynovitis of hand and wrist   . Peripheral neuropathy   . Personal history of alcoholism   . DJD (degenerative joint disease) of hip   . Myalgia and myositis, unspecified   . History of depression   . Morton's neuroma     Hx of, Left  . Carotid bruit     Left  . History of spinal fusion   . Personal history of peptic ulcer disease   . Solitary cyst of breast   . Basal cell carcinoma of face   . Attention deficit disorder without mention of hyperactivity   . Unspecified hypothyroidism   . Benign heart murmur   . Esophageal reflux   . Hypertension   . Pulmonary nodule   . Chronic bronchitis   . Wears glasses   . Abnormal blood pressure     left arm from old brachial artery repair  . Subclavian steal syndrome   . Carotid artery disease   . Atherosclerosis of renal artery   . Centrilobular emphysema     Current Outpatient Prescriptions on File Prior to Visit  Medication Sig Dispense Refill  . amLODipine (NORVASC) 10 MG tablet Take 0.5 tablets (5 mg total) by mouth daily. 90 tablet 1  . aspirin 81 MG tablet Take 81 mg by mouth daily.     Marland Kitchen buPROPion (WELLBUTRIN XL) 300 MG 24 hr tablet TAKE ONE (1) TABLET EACH DAY 90 tablet 2  . cloNIDine (CATAPRES) 0.1 MG tablet Take 1 tablet (0.1 mg total) by mouth 2 (two) times daily. 60 tablet 3  . diclofenac (FLECTOR) 1.3 % PTCH Place 1 patch onto the skin 2 (two) times daily. Dispense one box. 5 patch 0  . DULoxetine (CYMBALTA) 60 MG capsule TAKE ONE (1) CAPSULE EACH DAY 90 capsule 2  . ferrous sulfate dried (SLOW FE) 160 (50 FE) MG TBCR Take 160  mg by mouth 2 (two) times daily.     . Glucosamine 500 MG TABS Take 1 tablet by mouth 2 (two) times daily. Glucosamine-Chondronton    . L-Methylfolate 15 MG TABS TAKE ONE (1) TABLET EACH DAY 30 tablet 3  . levothyroxine (SYNTHROID, LEVOTHROID) 112 MCG tablet TAKE ONE (1) TABLET BY MOUTH EVERY DAY BEFORE BREAKFAST 90 tablet 1  . oxyCODONE-acetaminophen (ROXICET) 5-325 MG per tablet Take 1 tablet by mouth every 4 (four) hours as needed. 60 tablet 0  . pregabalin (LYRICA) 100 MG capsule Take 1 capsule (100 mg total) by mouth 3 (three) times daily. 90 capsule 1  . solifenacin (VESICARE) 10 MG tablet TAKE ONE (1) TABLET BY MOUTH EVERY DAY 90 tablet 1  . STRATTERA 100 MG capsule TAKE ONE CAPSULE BY MOUTH DAILY 30 capsule 1  . valsartan (DIOVAN) 160 MG tablet Take 1.5 tablets by mouth daily. 135 tablet 1   No current facility-administered medications on file prior to visit.    Allergies  Allergen Reactions  . Amoxicillin     REACTION: unspecified  . Codeine   . Hydrocodone-Acetaminophen     REACTION: itching  . Hydromorphone Hcl   . Morphine And Related Hives and Itching  . Morphine  Sulfate     REACTION: unspecified    Family History  Problem Relation Age of Onset  . Adopted: Yes  . Schizophrenia Son     Deceased 54  . Hypertension Daughter     Renal  . Healthy Son     x1  . Healthy Daughter     x2    History   Social History  . Marital Status: Single    Spouse Name: N/A  . Number of Children: Y  . Years of Education: N/A   Occupational History  . retired     Engineer, maintenance (IT), had own firm   Social History Main Topics  . Smoking status: Former Smoker -- 1.00 packs/day for 25 years    Types: Cigarettes    Quit date: 03/16/1983  . Smokeless tobacco: Never Used  . Alcohol Use: Yes     Comment: occ-hx alcohol abuse  . Drug Use: No  . Sexual Activity: Not on file   Other Topics Concern  . None   Social History Narrative   [employer]-accounting      Married  '60-50yrdivorced; '84-2 years, divorced      3 daughters- '61, '70, '71; 2 sons-'53,'55 (schizophrenic-died); 10 grandchildren      Lives alone with 1 dog and 3 cats      Pt unsure of family history- was adopted.                   Review of Systems - See HPI.  All other ROS are negative.  BP 110/53 mmHg  Pulse 77  Temp(Src) 97.7 F (36.5 C) (Oral)  Resp 14  Ht _0  (1.6 m)  Wt 111 lb (50.349 kg)  BMI 19.67 kg/m2  SpO2 99%  Physical Exam  Constitutional: She is oriented to person, place, and time and well-developed, well-nourished, and in no distress.  HENT:  Head: Normocephalic and atraumatic.  Cardiovascular: Normal rate, regular rhythm, normal heart sounds and intact distal pulses.   Pulmonary/Chest: Effort normal and breath sounds normal. No respiratory distress. She has no wheezes. She has no rales. She exhibits no tenderness.  Neurological: She is alert and oriented to person, place, and time.  Skin: Skin is warm and dry. No rash noted.  Psychiatric: Affect normal.  Vitals reviewed.   Recent Results (from the past 2160 hour(s))  CBC     Status: Abnormal   Collection Time: 05/14/14  9:42 AM  Result Value Ref Range   WBC 5.1 4.0 - 10.5 K/uL   RBC 3.81 (L) 3.87 - 5.11 Mil/uL   Platelets 241.0 150.0 - 400.0 K/uL   Hemoglobin 12.6 12.0 - 15.0 g/dL   HCT 37.0 36.0 - 46.0 %   MCV 97.1 78.0 - 100.0 fl   MCHC 34.0 30.0 - 36.0 g/dL   RDW 13.1 11.5 - 15.5 %  Comp Met (CMET)     Status: Abnormal   Collection Time: 05/14/14  9:42 AM  Result Value Ref Range   Sodium 139 135 - 145 mEq/L   Potassium 4.2 3.5 - 5.1 mEq/L   Chloride 105 96 - 112 mEq/L   CO2 32 19 - 32 mEq/L   Glucose, Bld 82 70 - 99 mg/dL   BUN 18 6 - 23 mg/dL   Creatinine, Ser 1.05 0.40 - 1.20 mg/dL   Total Bilirubin 0.3 0.2 - 1.2 mg/dL   Alkaline Phosphatase 63 39 - 117 U/L   AST 15 0 - 37 U/L   ALT 12 0 -  35 U/L   Total Protein 7.2 6.0 - 8.3 g/dL   Albumin 4.3 3.5 - 5.2 g/dL   Calcium 9.9 8.4  - 10.5 mg/dL   GFR 54.22 (L) >60.00 mL/min  TSH     Status: None   Collection Time: 05/14/14  9:42 AM  Result Value Ref Range   TSH 1.94 0.35 - 4.50 uIU/mL  Urinalysis, Routine w reflex microscopic     Status: Abnormal   Collection Time: 05/14/14  9:42 AM  Result Value Ref Range   Color, Urine YELLOW Yellow;Lt. Yellow   APPearance CLEAR Clear   Specific Gravity, Urine 1.020 1.000-1.030   pH 6.0 5.0 - 8.0   Total Protein, Urine NEGATIVE Negative   Urine Glucose NEGATIVE Negative   Ketones, ur NEGATIVE Negative   Bilirubin Urine NEGATIVE Negative   Hgb urine dipstick MODERATE (A) Negative   Urobilinogen, UA 0.2 0.0 - 1.0   Leukocytes, UA TRACE (A) Negative   Nitrite NEGATIVE Negative   WBC, UA 0-2/hpf 0-2/hpf   RBC / HPF 11-20/hpf (A) 0-2/hpf   Squamous Epithelial / LPF Rare(0-4/hpf) Rare(0-4/hpf)  PPD     Status: Normal   Collection Time: 05/20/14 11:22 AM  Result Value Ref Range   TB Skin Test Negative    Induration 0 mm  CULTURE, URINE COMPREHENSIVE     Status: None   Collection Time: 05/20/14 12:01 PM  Result Value Ref Range   Colony Count NO GROWTH    Organism ID, Bacteria NO GROWTH   Urinalysis, Routine w reflex microscopic     Status: Abnormal   Collection Time: 05/20/14 12:01 PM  Result Value Ref Range   Color, Urine YELLOW Yellow;Lt. Yellow   APPearance CLEAR Clear   Specific Gravity, Urine 1.020 1.000-1.030   pH 6.0 5.0 - 8.0   Total Protein, Urine NEGATIVE Negative   Urine Glucose NEGATIVE Negative   Ketones, ur NEGATIVE Negative   Bilirubin Urine NEGATIVE Negative   Hgb urine dipstick NEGATIVE Negative   Urobilinogen, UA 0.2 0.0 - 1.0   Leukocytes, UA NEGATIVE Negative   Nitrite NEGATIVE Negative   WBC, UA none seen 0-2/hpf   RBC / HPF none seen 0-2/hpf   Ca Oxalate Crys, UA Presence of (A) None    Assessment/Plan: Hereditary and idiopathic peripheral neuropathy Unfortunately only some of her records have been received for review. Record request made  to for [facility] and Dr. Matthew Saras. It seems that she had a EMG and nerve conduction study about 10 years ago that did show some abnormalities. Informed patient that given how long it has been since studies, and her persistent symptoms, recommend that she follow-up with her neurologist, Dr. Posey Pronto, for further evaluation. Has upcoming appointment with sports medicine for an ultrasound of her left hip. Encouraged patient to keep that appointment. Continue Lyrica as directed. Patient refuses to increase dose of this medicine.

## 2014-05-28 ENCOUNTER — Telehealth: Payer: Self-pay | Admitting: Physician Assistant

## 2014-05-28 DIAGNOSIS — I10 Essential (primary) hypertension: Secondary | ICD-10-CM

## 2014-05-28 MED ORDER — AMLODIPINE BESYLATE 5 MG PO TABS: 5.0000 mg | ORAL_TABLET | Freq: Every day | ORAL | Status: DC

## 2014-05-28 NOTE — Telephone Encounter (Signed)
Done

## 2014-05-28 NOTE — Telephone Encounter (Signed)
Caller name: Artisha Relation to pt: self Call back number: [phone] Pharmacy:  Reason for call:   Patient would like to talk to Ivin Booty or Janett Billow regarding records that were supposed to be sent to [place] from Dr. Ennis Forts.

## 2014-05-28 NOTE — Telephone Encounter (Signed)
Caller name: Sam from [facility]  Call back number: [phone]   Reason for call:  Pharmacy called regarding amLODipine (NORVASC) 10 MG tablet. Pt is requesting 5MG  instead of 10MG  please advise

## 2014-05-30 ENCOUNTER — Ambulatory Visit: Payer: Medicare Other | Admitting: Family Medicine

## 2014-05-30 ENCOUNTER — Telehealth: Payer: Self-pay | Admitting: Physician Assistant

## 2014-05-30 NOTE — Telephone Encounter (Signed)
Caller name: Ellsie, Violette Relation to pt: self  Call back number: [phone] Pharmacy:  Reason for call:   pt in need of clinical advice regarding clindamycin. Please advise

## 2014-05-30 NOTE — Telephone Encounter (Signed)
Returned call, phone rang several times, no opportunity to leave message

## 2014-06-03 ENCOUNTER — Ambulatory Visit: Payer: Medicare Other | Admitting: Family Medicine

## 2014-06-03 NOTE — Telephone Encounter (Signed)
LMOM with contact name and number for return call RE: advice on ABX Rx, asked for more detailed information [who px, what px for, what in general questions she has]/SLS

## 2014-06-06 ENCOUNTER — Other Ambulatory Visit: Payer: Self-pay | Admitting: Physician Assistant

## 2014-06-10 NOTE — Telephone Encounter (Signed)
Medication Detail      Disp Refills Start End     levothyroxine (SYNTHROID, LEVOTHROID) 112 MCG tablet 90 tablet 1 12/09/2013     Sig: TAKE ONE (1) TABLET BY MOUTH EVERY DAY BEFORE BREAKFAST    E-Prescribing Status: Receipt confirmed by pharmacy (12/09/2013 4:34 PM EDT)      Rx request to pharmacy/SLS

## 2014-06-18 ENCOUNTER — Ambulatory Visit (INDEPENDENT_AMBULATORY_CARE_PROVIDER_SITE_OTHER): Payer: Medicare Other | Admitting: Family Medicine

## 2014-06-18 ENCOUNTER — Other Ambulatory Visit (INDEPENDENT_AMBULATORY_CARE_PROVIDER_SITE_OTHER): Payer: Medicare Other

## 2014-06-18 ENCOUNTER — Encounter: Payer: Self-pay | Admitting: Family Medicine

## 2014-06-18 ENCOUNTER — Telehealth: Payer: Self-pay | Admitting: Family Medicine

## 2014-06-18 ENCOUNTER — Ambulatory Visit (INDEPENDENT_AMBULATORY_CARE_PROVIDER_SITE_OTHER)
Admission: RE | Admit: 2014-06-18 | Discharge: 2014-06-18 | Disposition: A | Discharge status: Home / Self Care | Payer: Medicare Other | Source: Ambulatory Visit | Attending: Family Medicine | Admitting: Family Medicine

## 2014-06-18 VITALS — BP 110/58 | HR 59 | Ht 63.0 in | Wt 109.0 lb

## 2014-06-18 DIAGNOSIS — M25552 Pain in left hip: Secondary | ICD-10-CM

## 2014-06-18 DIAGNOSIS — M129 Arthropathy, unspecified: Secondary | ICD-10-CM | POA: Diagnosis not present

## 2014-06-18 DIAGNOSIS — M6250 Muscle wasting and atrophy, not elsewhere classified, unspecified site: Secondary | ICD-10-CM

## 2014-06-18 DIAGNOSIS — Z96649 Presence of unspecified artificial hip joint: Secondary | ICD-10-CM

## 2014-06-18 DIAGNOSIS — T8484XA Pain due to internal orthopedic prosthetic devices, implants and grafts, initial encounter: Secondary | ICD-10-CM | POA: Diagnosis not present

## 2014-06-18 DIAGNOSIS — M25512 Pain in left shoulder: Secondary | ICD-10-CM

## 2014-06-18 DIAGNOSIS — M19012 Primary osteoarthritis, left shoulder: Secondary | ICD-10-CM | POA: Insufficient documentation

## 2014-06-18 DIAGNOSIS — Z471 Aftercare following joint replacement surgery: Secondary | ICD-10-CM | POA: Diagnosis not present

## 2014-06-18 DIAGNOSIS — Z96642 Presence of left artificial hip joint: Secondary | ICD-10-CM | POA: Diagnosis not present

## 2014-06-18 DIAGNOSIS — M25559 Pain in unspecified hip: Secondary | ICD-10-CM | POA: Diagnosis not present

## 2014-06-18 LAB — VITAMIN B12: Vitamin B-12: 526 pg/mL (ref 211–911)

## 2014-06-18 LAB — C-REACTIVE PROTEIN: CRP: <0.1 mg/dL — ABNORMAL LOW (ref 0.5–20.0)

## 2014-06-18 LAB — SEDIMENTATION RATE: Sed Rate: 17 mm/hr (ref 0–22)

## 2014-06-18 NOTE — Progress Notes (Signed)
[facility] [address], [place] [address] Phone: [phone] Subjective:    I'm seeing this patient by the request  of:  Leeanne Rio, PA-C   Chief complaint: Left hip pain  [id number] Rose [name] is a 74 y.o. female coming in with complaint of left hip pain. Reviewing patient's chart patient has had a history of moderate osteophytic changes of the left hip and had injections in 2009. Patient states she also had a hip replacement within a year after her last injection. Patient does not remember the true [place] we do not have any records. Patient states unfortunately the hip replacement did not go great. Patient states that she was in the hospital for 2 weeks and then in physical therapy for 2 weeks. Patient states that she continued to have pain. Patient states now the pain seems to be unrelenting. Describes it as a sharp pain with walking. Patient did follow-up with her surgeon a couple times but unfortunately they were unable to find out the cause. Patient states that she had x-rays but that was easily workup. Patient states that she feels so weak in her legs that she has difficulty actually walking on a long amount of time. Patient states that the left leg sometimes feels like it can give out on her. Rates the severity of pain as 9 out of 10. Patient is also had a history of bilateral hip pain and is on Cymbalta as well Lyrica. Patient also has oxycodone for breakthrough pain. Patient has had numerous surgeries including a lumbar fusion the patient denies any back pain.    patient is also complaining of severe left shoulder pain. Patient does use a cane in this arm and states that it seems to be worsening. States that certain activities can give her difficulty. Patient has had a past medical history significant for some type of surgery previously. Patient has not had anything done or had it evaluated at the last 2 years. Patient states that  now it is waking her up at night.  Past medical history, social, surgical and family history all reviewed in electronic medical record.  Past Medical History  Diagnosis Date  . Other tenosynovitis of hand and wrist   . Peripheral neuropathy   . Personal history of alcoholism   . DJD (degenerative joint disease) of hip   . Myalgia and myositis, unspecified   . History of depression   . Morton's neuroma     Hx of, Left  . Carotid bruit     Left  . History of spinal fusion   . Personal history of peptic ulcer disease   . Solitary cyst of breast   . Basal cell carcinoma of face   . Attention deficit disorder without mention of hyperactivity   . Unspecified hypothyroidism   . Benign heart murmur   . Esophageal reflux   . Hypertension   . Pulmonary nodule   . Chronic bronchitis   . Wears glasses   . Abnormal blood pressure     left arm from old brachial artery repair  . Subclavian steal syndrome   . Carotid artery disease   . Atherosclerosis of renal artery   . Centrilobular emphysema    Past Surgical History  Procedure Laterality Date  . Appendectomy    . Tonsillectomy and adenoidectomy    . Left brachial artery repair of pseudoaneurysm  2001    post a cath  . Percutanous transluminal angioplasty of renal  arteris    . Lumbar fusion  09/2004    T12-L5 (Dr. Patrice Paradise)  . Cervical fusion  2007    Dr. Valli Glance approach  . Total hip arthroplasty  2008    left  . Shoulder arthroscopy  09/2008    Left  . Orif tibia fracture  2010    Left distal  . Wisdom tooth extraction    . Carpometacarpel suspension plasty Right 08/07/2013    Procedure: CARPOMETACARPEL [facility]) SUSPENSION PLASTY RIGHT THUMB;  Surgeon: Wynonia Sours, MD;  Location: [facility];  Service: Orthopedics;  Laterality: Right;    History  Substance Use Topics  . Smoking status: Former Smoker -- 1.00 packs/day for 25 years    Types: Cigarettes    Quit date: 03/16/1983  . Smokeless tobacco: Never  Used  . Alcohol Use: Yes     Comment: occ-hx alcohol abuse   The current method of family planning is none. Allergies  Allergen Reactions  . Amoxicillin     REACTION: unspecified  . Codeine   . Hydrocodone-Acetaminophen     REACTION: itching  . Hydromorphone Hcl   . Morphine And Related Hives and Itching  . Morphine Sulfate     REACTION: unspecified   Review of Systems: No headache, visual changes, nausea, vomiting, diarrhea, constipation, dizziness, abdominal pain, skin rash, fevers, chills, night sweats, weight loss, swollen lymph nodes, body aches, joint swelling, muscle aches, chest pain, shortness of breath, mood changes.   Objective Blood pressure 110/58, pulse 59, height 5\' 3"  (1.6 m), weight 109 lb (49.442 kg), SpO2 98 %.  General: No apparent distress alert and oriented x3 mood and affect normal, dressed appropriately. Under weight HEENT: Pupils equal, extraocular movements intact  Respiratory: Patient's speak in full sentences and does not appear short of breath  Cardiovascular: No lower extremity edema, non tender, no erythema  Skin: Warm dry intact with no signs of infection or rash on extremities or on axial skeleton.  Abdomen: Soft nontender  Neuro: Cranial nerves II through XII are intact, neurovascularly intact in all extremities with 2+ DTRs and 2+ pulses.  Lymph: No lymphadenopathy of posterior or anterior cervical chain or axillae bilaterally.  Gait antalgic gait and walks with a cane MSK:  Non tender with full range of motion and good stability and symmetric strength and tone of , elbows, wrist,  and ankles bilaterally. Osteophytic changes of multiple joints.  Left hip exam shows the patient's incision is well-healed. No signs of infection. Patient though does have good range of motion but states that she has diffuse tenderness to even light palpation. Patient ambulating does have an antalgic gait neurovascularly intact distally. Patient has significant atrophy of  the quadriceps bilaterally but severe on the left side. No clunking occurred of the hip. Patient does have severe weakness of the quadriceps bilaterally.    Shoulder: left Inspection reveals no abnormalities, atrophy or asymmetry. Palpation is normal with no tenderness over AC joint or bicipital groove. ROM is full in all planes passively. Patient does have significant crepitus with range of motion Rotator cuff strength 4-5 but symmetric signs of impingement with positive Neer and Hawkin's tests, but negative empty can sign. Speeds and Yergason's tests normal. No labral pathology noted with negative Obrien's, negative clunk and good stability. Normal scapular function observed. No painful arc and no drop arm sign. No apprehension sign Contralateral shoulder unremarkable  Procedure note After informed written and verbal consent, patient was seated on exam table. Left shoulder was  prepped with alcohol swab and utilizing posterior approach, patient's right glenohumeral space was injected with 4:1  marcaine 0.5%: Kenalog 40mg /dL. Patient tolerated the procedure well without immediate complications..  Impression and Recommendations:     This case required medical decision making of moderate complexity.

## 2014-06-18 NOTE — Telephone Encounter (Signed)
Patient states she will be moving to [place] in two weeks for physical therapy.  Right now she is at [facility].  She states she will need an order for both places.

## 2014-06-18 NOTE — Patient Instructions (Addendum)
Good to see you.  Ice 10 minutes 2 times daily. Usually after activity and before bed. Exercises 3 times a week.  Good shoes with rigid bottom.  Rose [name], [place]rell or New balance greater then 700 Glucosamine sulfate 1500mg  twice a day is a supplement that has been shown to help moderate to severe arthritis. Vitamin D 100 IU daily Fish oil 2 grams daily.  Tumeric 500mg  twice daily.  Capsaicin topically up to four times a day may also help with pain. It's important that you continue to stay active. Controlling your weight is important.  Consider physical therapy to strengthen muscles around the joint that hurts to take pressure off of the joint itself. Shoe inserts with good arch support may be helpful.  We do make them and we can consider this.  Water aerobics and cycling with low resistance are the best two types of exercise for arthritis. Come back and see me in 4 weeks. I will release the records to you in [place] of labs and xray.

## 2014-06-18 NOTE — Progress Notes (Signed)
Pre visit review using our clinic review tool, if applicable. No additional management support is needed unless otherwise documented below in the visit note. 

## 2014-06-18 NOTE — Assessment & Plan Note (Signed)
Patient will have ordered x-rays today. We will also check some labs to see if there is any chromium cobalt elevation that would also signify possible difficulty. Patient does not want to follow-up with her orthopedic surgeon. Patient has seen multiple different providers for this previously. Depending on findings we'll discuss if an MRI is necessary. Patient will come back and see me again in 3 weeks.

## 2014-06-18 NOTE — Assessment & Plan Note (Signed)
Believe the patient does have underlying arthritis as well as some mild subacromial bursitis. Patient was given an injection today. We discussed icing regimen and topical anti-inflammatories. We discussed over-the-counter medications a could be beneficial as well. Patient and will come back and see me again in 3 weeks. We'll consider other imaging is necessary.

## 2014-06-18 NOTE — Assessment & Plan Note (Signed)
Patient does have significant atrophy of the muscles of her legs. Her left one is severely atrophied. I'm concerned that this could be secondary to a nerve root impingement. Patient states that she is not having any back pain whatsoever and would like to avoid any workup for this at this time. Depending on patient she may also have possibly eating disorder or depression neck and also be contribute in. We will continue to monitor. Patient was having labs ordered today for other metabolic conditions a could be contributing. If patient continues to have difficulty even after protein supplementation and workup we may need to consider an EMG and nerve conduction study which patient states that she is actually going to have in the near future.

## 2014-06-19 ENCOUNTER — Other Ambulatory Visit: Payer: Self-pay | Admitting: *Deleted

## 2014-06-19 DIAGNOSIS — M25552 Pain in left hip: Secondary | ICD-10-CM

## 2014-06-19 LAB — ANA: Anti Nuclear Antibody(ANA): NEGATIVE

## 2014-06-19 NOTE — Telephone Encounter (Signed)
Order entered

## 2014-06-20 LAB — PAN-ANCA
Atypical p-ANCA Screen: NEGATIVE
Myeloperoxidase Abs: <1
Serine Protease 3: <1
c-ANCA Screen: NEGATIVE
p-ANCA Screen: POSITIVE — AB

## 2014-06-20 LAB — ANCA TITERS

## 2014-06-21 DIAGNOSIS — M1812 Unilateral primary osteoarthritis of first carpometacarpal joint, left hand: Secondary | ICD-10-CM | POA: Diagnosis not present

## 2014-06-22 LAB — CHROMIUM AND COBALT, WB (MOM): Cobalt: <1 ng/mL (ref <3.0)

## 2014-06-24 ENCOUNTER — Other Ambulatory Visit: Payer: Self-pay | Admitting: Physician Assistant

## 2014-06-24 ENCOUNTER — Ambulatory Visit
Admission: RE | Admit: 2014-06-24 | Discharge: 2014-06-24 | Disposition: A | Discharge status: Home / Self Care | Payer: Medicare Other | Source: Ambulatory Visit | Attending: Family Medicine | Admitting: Family Medicine

## 2014-06-24 DIAGNOSIS — M1612 Unilateral primary osteoarthritis, left hip: Secondary | ICD-10-CM | POA: Diagnosis not present

## 2014-06-24 DIAGNOSIS — M25552 Pain in left hip: Secondary | ICD-10-CM

## 2014-06-24 DIAGNOSIS — Z471 Aftercare following joint replacement surgery: Secondary | ICD-10-CM | POA: Diagnosis not present

## 2014-06-24 DIAGNOSIS — Z96642 Presence of left artificial hip joint: Secondary | ICD-10-CM | POA: Diagnosis not present

## 2014-06-25 ENCOUNTER — Other Ambulatory Visit: Payer: Self-pay | Admitting: *Deleted

## 2014-06-25 DIAGNOSIS — R768 Other specified abnormal immunological findings in serum: Secondary | ICD-10-CM

## 2014-06-28 ENCOUNTER — Other Ambulatory Visit: Payer: Self-pay | Admitting: *Deleted

## 2014-06-28 DIAGNOSIS — M25512 Pain in left shoulder: Secondary | ICD-10-CM

## 2014-07-10 DIAGNOSIS — R2689 Other abnormalities of gait and mobility: Secondary | ICD-10-CM | POA: Diagnosis not present

## 2014-07-10 DIAGNOSIS — M6281 Muscle weakness (generalized): Secondary | ICD-10-CM | POA: Diagnosis not present

## 2014-07-16 ENCOUNTER — Encounter: Payer: Self-pay | Admitting: Family Medicine

## 2014-07-16 ENCOUNTER — Ambulatory Visit (INDEPENDENT_AMBULATORY_CARE_PROVIDER_SITE_OTHER): Payer: Medicare Other | Admitting: Family Medicine

## 2014-07-16 VITALS — BP 122/64 | HR 83 | Ht 63.0 in | Wt 109.0 lb

## 2014-07-16 DIAGNOSIS — M6250 Muscle wasting and atrophy, not elsewhere classified, unspecified site: Secondary | ICD-10-CM

## 2014-07-16 DIAGNOSIS — M129 Arthropathy, unspecified: Secondary | ICD-10-CM

## 2014-07-16 DIAGNOSIS — Z96649 Presence of unspecified artificial hip joint: Secondary | ICD-10-CM

## 2014-07-16 DIAGNOSIS — T8484XD Pain due to internal orthopedic prosthetic devices, implants and grafts, subsequent encounter: Secondary | ICD-10-CM

## 2014-07-16 DIAGNOSIS — M19012 Primary osteoarthritis, left shoulder: Secondary | ICD-10-CM

## 2014-07-16 NOTE — Assessment & Plan Note (Signed)
resolved 

## 2014-07-16 NOTE — Progress Notes (Signed)
Pre visit review using our clinic review tool, if applicable. No additional management support is needed unless otherwise documented below in the visit note. 

## 2014-07-16 NOTE — Patient Instructions (Signed)
Good to see you Rose [name] is your friend after activity especially to the shoulder Look into yetti or swell coolers Physical therapy for your shoulder Continue the protein for sure.  You have made great strides.  If shoulder is not better see me again in 4-6 weeks.

## 2014-07-16 NOTE — Assessment & Plan Note (Signed)
Patient is improving overall. Discussed icing regimen and home exercises. Patient will continue with the conservative therapy and patient is going to formal physical therapy at her living establishment that'll be L4. Patient then can follow-up with me again in 4-6 weeks if not completely resolved and we'll consider another injection into the shoulder.

## 2014-07-16 NOTE — Progress Notes (Signed)
[name] [address], [place] [address] Phone: [phone] Subjective:    I'm seeing this patient by the request  of:  Leeanne Rio, PA-C   Chief complaint: Left hip pain  BTD:Rose [name] is a 74 y.o. female coming in with complaint of left hip pain. Reviewing patient's chart patient has had a history of moderate osteophytic changes of the left hip and had injections in 2009. Patient states she also had a hip replacement within a year after her last injection.patient also started on protein supplementation recently because she continued to have the chronic pain as well as weakness in her legs. Patient notices significant improvement and states that 80% of her daily pains is significantly better. Patient has been able to increase her activity as well. Denies any new symptoms.     Continue the left shoulder pain, stops her from exercises but not daily activities. Patient has been going to formal physical therapy somewhat and has noticed some mild improvement but continues to have some discomfort. Nothing that his stopping her from her activities now. Patient states that it can be some mild weakness sometimes.  Past medical history, social, surgical and family history all reviewed in electronic medical record.  Past Medical History  Diagnosis Date  . Other tenosynovitis of hand and wrist   . Peripheral neuropathy   . Personal history of alcoholism   . DJD (degenerative joint disease) of hip   . Myalgia and myositis, unspecified   . History of depression   . Morton's neuroma     Hx of, Left  . Carotid bruit     Left  . History of spinal fusion   . Personal history of peptic ulcer disease   . Solitary cyst of breast   . Basal cell carcinoma of face   . Attention deficit disorder without mention of hyperactivity   . Unspecified hypothyroidism   . Benign heart murmur   . Esophageal reflux   . Hypertension   . Pulmonary  nodule   . Chronic bronchitis   . Wears glasses   . Abnormal blood pressure     left arm from old brachial artery repair  . Subclavian steal syndrome   . Carotid artery disease   . Atherosclerosis of renal artery   . Centrilobular emphysema    Past Surgical History  Procedure Laterality Date  . Appendectomy    . Tonsillectomy and adenoidectomy    . Left brachial artery repair of pseudoaneurysm  2001    post a cath  . Percutanous transluminal angioplasty of renal arteris    . Lumbar fusion  09/2004    T12-L5 (Dr. Patrice Paradise)  . Cervical fusion  2007    Dr. Valli Glance approach  . Total hip arthroplasty  2008    left  . Shoulder arthroscopy  09/2008    Left  . Orif tibia fracture  2010    Left distal  . Wisdom tooth extraction    . Carpometacarpel suspension plasty Right 08/07/2013    Procedure: [facility]L [facility]) SUSPENSION PLASTY RIGHT THUMB;  Surgeon: Wynonia Sours, MD;  Location: [place];  Service: Orthopedics;  Laterality: Right;    History  Substance Use Topics  . Smoking status: Former Smoker -- 1.00 packs/day for 25 years    Types: Cigarettes    Quit date: 03/16/1983  . Smokeless tobacco: Never Used  . Alcohol Use: Yes     Comment: occ-hx alcohol abuse  The current method of family planning is none. Allergies  Allergen Reactions  . Amoxicillin     REACTION: unspecified  . Codeine   . Hydrocodone-Acetaminophen     REACTION: itching  . Hydromorphone Hcl   . Morphine And Related Hives and Itching  . Morphine Sulfate     REACTION: unspecified   Review of Systems: No headache, visual changes, nausea, vomiting, diarrhea, constipation, dizziness, abdominal pain, skin rash, fevers, chills, night sweats, weight loss, swollen lymph nodes, body aches, joint swelling, muscle aches, chest pain, shortness of breath, mood changes.   Objective Blood pressure 122/64, pulse 83, height 5\' 3"  (1.6 m), weight 109 lb (49.442 kg), SpO2 98 %.  General: No  apparent distress alert and oriented x3 mood and affect normal, dressed appropriately. Under weight HEENT: Pupils equal, extraocular movements intact  Respiratory: Patient's speak in full sentences and does not appear short of breath  Cardiovascular: No lower extremity edema, non tender, no erythema  Skin: Warm dry intact with no signs of infection or rash on extremities or on axial skeleton.  Abdomen: Soft nontender  Neuro: Cranial nerves II through XII are intact, neurovascularly intact in all extremities with 2+ DTRs and 2+ pulses.  Lymph: No lymphadenopathy of posterior or anterior cervical chain or axillae bilaterally.  Gait antalgic gait but not using a cane MSK:  Non tender with full range of motion and good stability and symmetric strength and tone of , elbows, wrist,  and ankles bilaterally. Osteophytic changes of multiple joints.  Left hip exam shows the patient's incision is well-healed. No signs of infection. Patient though does have good range of motion but states that she has diffuse tenderness to even light palpation. Improvement and patient's walking    Shoulder: left Inspection reveals no abnormalities, atrophy or asymmetry. Palpation is normal with no tenderness over AC joint or bicipital groove. ROM is full in all planes passively. Patient does have significant crepitus with range of motion Rotator cuff strength 4-5 but symmetricmild improvement signs of impingement with positive Neer and Hawkin's tests, but negative empty can sign.mild improvement Speeds and Yergason's tests normal. No labral pathology noted with negative Obrien's, negative clunk and good stability. Normal scapular function observed. No painful arc and no drop arm sign. No apprehension sign Contralateral shoulder unremarkable  Impression and Recommendations:     This case required medical decision making of moderate complexity.

## 2014-07-16 NOTE — Assessment & Plan Note (Signed)
Patient has been doing somewhat better with the protein supplementation and will continue. We discussed icing regimen as well. Patient will come back and see me again in 3-4 weeks. Discussed timing of nutrition after exercises well.

## 2014-07-17 DIAGNOSIS — M6281 Muscle weakness (generalized): Secondary | ICD-10-CM | POA: Diagnosis not present

## 2014-07-17 DIAGNOSIS — R2689 Other abnormalities of gait and mobility: Secondary | ICD-10-CM | POA: Diagnosis not present

## 2014-07-19 DIAGNOSIS — M6281 Muscle weakness (generalized): Secondary | ICD-10-CM | POA: Diagnosis not present

## 2014-07-19 DIAGNOSIS — R2689 Other abnormalities of gait and mobility: Secondary | ICD-10-CM | POA: Diagnosis not present

## 2014-07-22 DIAGNOSIS — R2689 Other abnormalities of gait and mobility: Secondary | ICD-10-CM | POA: Diagnosis not present

## 2014-07-22 DIAGNOSIS — M6281 Muscle weakness (generalized): Secondary | ICD-10-CM | POA: Diagnosis not present

## 2014-07-24 DIAGNOSIS — R2689 Other abnormalities of gait and mobility: Secondary | ICD-10-CM | POA: Diagnosis not present

## 2014-07-24 DIAGNOSIS — M6281 Muscle weakness (generalized): Secondary | ICD-10-CM | POA: Diagnosis not present

## 2014-07-25 DIAGNOSIS — R2689 Other abnormalities of gait and mobility: Secondary | ICD-10-CM | POA: Diagnosis not present

## 2014-07-25 DIAGNOSIS — M6281 Muscle weakness (generalized): Secondary | ICD-10-CM | POA: Diagnosis not present

## 2014-07-26 ENCOUNTER — Telehealth: Payer: Self-pay | Admitting: Physician Assistant

## 2014-07-26 NOTE — Telephone Encounter (Signed)
Pt notified // pt to call pharmacy to make sure theres a refill.

## 2014-07-26 NOTE — Telephone Encounter (Signed)
She should have 3 refills on file.  Did she call in refill yet?

## 2014-07-26 NOTE — Telephone Encounter (Signed)
rx refill strattera 100 mg  Last OV- 05/24/14 Last refilled- 06/24/14 #30 / 3 rf  LAst UDS- none

## 2014-07-26 NOTE — Telephone Encounter (Signed)
Caller: Dewanna Hurston Ph#: [phone] Pharmacy: [facility] for refill of STRATTERA 100 MG capsule. Currently out of medication. Taking once per day.

## 2014-07-29 ENCOUNTER — Telehealth: Payer: Self-pay | Admitting: Physician Assistant

## 2014-07-29 MED ORDER — CIPROFLOXACIN HCL 250 MG PO TABS: 250.0000 mg | ORAL_TABLET | Freq: Two times a day (BID) | ORAL | Status: DC

## 2014-07-29 NOTE — Telephone Encounter (Addendum)
I am assuming she means ciprofloxacin. Please verify with patient.  Rx already sent in. If anything needs to be changed let me know, as typically other antibiotics are used -- Amoxicillin, Clindamycin, etc.

## 2014-07-29 NOTE — Telephone Encounter (Signed)
Patient informed antibiotic sent in.

## 2014-07-29 NOTE — Telephone Encounter (Signed)
NEEDS AN RX FOR CIFOLOXIN REQUIRED BY THE DENTIST  [facility]   DENTIST APPT TOMORROW

## 2014-07-30 DIAGNOSIS — M6281 Muscle weakness (generalized): Secondary | ICD-10-CM | POA: Diagnosis not present

## 2014-07-30 DIAGNOSIS — R2689 Other abnormalities of gait and mobility: Secondary | ICD-10-CM | POA: Diagnosis not present

## 2014-07-31 ENCOUNTER — Emergency Department (HOSPITAL_BASED_OUTPATIENT_CLINIC_OR_DEPARTMENT_OTHER)
Admission: EM | Admit: 2014-07-31 | Discharge: 2014-07-31 | Disposition: A | Discharge status: Home / Self Care | Payer: Medicare Other | Attending: Emergency Medicine | Admitting: Emergency Medicine

## 2014-07-31 ENCOUNTER — Encounter (HOSPITAL_BASED_OUTPATIENT_CLINIC_OR_DEPARTMENT_OTHER): Payer: Self-pay | Admitting: Emergency Medicine

## 2014-07-31 ENCOUNTER — Emergency Department (HOSPITAL_BASED_OUTPATIENT_CLINIC_OR_DEPARTMENT_OTHER): Payer: Medicare Other

## 2014-07-31 DIAGNOSIS — K219 Gastro-esophageal reflux disease without esophagitis: Secondary | ICD-10-CM | POA: Diagnosis not present

## 2014-07-31 DIAGNOSIS — Y9389 Activity, other specified: Secondary | ICD-10-CM | POA: Diagnosis not present

## 2014-07-31 DIAGNOSIS — Z8739 Personal history of other diseases of the musculoskeletal system and connective tissue: Secondary | ICD-10-CM | POA: Diagnosis not present

## 2014-07-31 DIAGNOSIS — I10 Essential (primary) hypertension: Secondary | ICD-10-CM | POA: Diagnosis not present

## 2014-07-31 DIAGNOSIS — F909 Attention-deficit hyperactivity disorder, unspecified type: Secondary | ICD-10-CM | POA: Diagnosis not present

## 2014-07-31 DIAGNOSIS — Z7982 Long term (current) use of aspirin: Secondary | ICD-10-CM | POA: Diagnosis not present

## 2014-07-31 DIAGNOSIS — S93401A Sprain of unspecified ligament of right ankle, initial encounter: Secondary | ICD-10-CM | POA: Diagnosis not present

## 2014-07-31 DIAGNOSIS — R011 Cardiac murmur, unspecified: Secondary | ICD-10-CM | POA: Insufficient documentation

## 2014-07-31 DIAGNOSIS — Z79899 Other long term (current) drug therapy: Secondary | ICD-10-CM | POA: Diagnosis not present

## 2014-07-31 DIAGNOSIS — Z880 Allergy status to penicillin: Secondary | ICD-10-CM | POA: Diagnosis not present

## 2014-07-31 DIAGNOSIS — W1839XA Other fall on same level, initial encounter: Secondary | ICD-10-CM | POA: Diagnosis not present

## 2014-07-31 DIAGNOSIS — Z8709 Personal history of other diseases of the respiratory system: Secondary | ICD-10-CM | POA: Diagnosis not present

## 2014-07-31 DIAGNOSIS — Z792 Long term (current) use of antibiotics: Secondary | ICD-10-CM | POA: Diagnosis not present

## 2014-07-31 DIAGNOSIS — E039 Hypothyroidism, unspecified: Secondary | ICD-10-CM | POA: Diagnosis not present

## 2014-07-31 DIAGNOSIS — R600 Localized edema: Secondary | ICD-10-CM | POA: Diagnosis not present

## 2014-07-31 DIAGNOSIS — Z8742 Personal history of other diseases of the female genital tract: Secondary | ICD-10-CM | POA: Diagnosis not present

## 2014-07-31 DIAGNOSIS — Y998 Other external cause status: Secondary | ICD-10-CM | POA: Insufficient documentation

## 2014-07-31 DIAGNOSIS — Z85828 Personal history of other malignant neoplasm of skin: Secondary | ICD-10-CM | POA: Insufficient documentation

## 2014-07-31 DIAGNOSIS — Z96641 Presence of right artificial hip joint: Secondary | ICD-10-CM | POA: Insufficient documentation

## 2014-07-31 DIAGNOSIS — Y9289 Other specified places as the place of occurrence of the external cause: Secondary | ICD-10-CM | POA: Insufficient documentation

## 2014-07-31 DIAGNOSIS — Z8711 Personal history of peptic ulcer disease: Secondary | ICD-10-CM | POA: Diagnosis not present

## 2014-07-31 DIAGNOSIS — M25571 Pain in right ankle and joints of right foot: Secondary | ICD-10-CM | POA: Diagnosis not present

## 2014-07-31 DIAGNOSIS — Z791 Long term (current) use of non-steroidal anti-inflammatories (NSAID): Secondary | ICD-10-CM | POA: Insufficient documentation

## 2014-07-31 DIAGNOSIS — S99911A Unspecified injury of right ankle, initial encounter: Secondary | ICD-10-CM | POA: Diagnosis not present

## 2014-07-31 DIAGNOSIS — Z87891 Personal history of nicotine dependence: Secondary | ICD-10-CM | POA: Diagnosis not present

## 2014-07-31 DIAGNOSIS — R031 Nonspecific low blood-pressure reading: Secondary | ICD-10-CM | POA: Diagnosis not present

## 2014-07-31 DIAGNOSIS — G629 Polyneuropathy, unspecified: Secondary | ICD-10-CM | POA: Insufficient documentation

## 2014-07-31 DIAGNOSIS — M7989 Other specified soft tissue disorders: Secondary | ICD-10-CM | POA: Diagnosis not present

## 2014-07-31 MED ORDER — OXYCODONE-ACETAMINOPHEN 5-325 MG PO TABS: 1.0000 | ORAL_TABLET | ORAL | Status: DC | PRN

## 2014-07-31 MED ORDER — OXYCODONE-ACETAMINOPHEN 5-325 MG PO TABS
1.0000 | ORAL_TABLET | Freq: Once | ORAL | Status: AC
  Administered 2014-07-31: 1 via ORAL
  Filled 2014-07-31: qty 1

## 2014-07-31 NOTE — ED Notes (Signed)
Patient given ankle ASO to go home with

## 2014-07-31 NOTE — ED Notes (Signed)
Patient reports that she fell about 8 30 last night and it woke her up throughout the night.

## 2014-07-31 NOTE — ED Notes (Signed)
Patient reports that she has no ride home. [facility] called and they do not have tranportation at night. So a cab called for the patient

## 2014-07-31 NOTE — ED Provider Notes (Signed)
CSN: [id number]     Arrival date & time 07/31/14  0321 History   First MD Initiated Contact with Patient 07/31/14 [phone]     Chief Complaint  Patient presents with  . Ankle Injury     (Consider location/radiation/quality/duration/timing/severity/associated sxs/prior Treatment) HPI This is a 74 year old female with some weakness in her left hip due to complications of a hip replacement. She was ambulating about 8:30 PM yesterday evening and lost her balance and fell. In the process she twisted her right ankle. She is now having moderate to severe pain in the right ankle, worse with movement or attempted ambulation. The pain is located posterior and inferior to the medial malleolus that there is also associated swelling and ecchymosis of the lateral aspect of the ankle. She denies other injury.  Past Medical History  Diagnosis Date  . Other tenosynovitis of hand and wrist   . Peripheral neuropathy   . Personal history of alcoholism   . DJD (degenerative joint disease) of hip   . Myalgia and myositis, unspecified   . History of depression   . Morton's neuroma     Hx of, Left  . Carotid bruit     Left  . History of spinal fusion   . Personal history of peptic ulcer disease   . Solitary cyst of breast   . Basal cell carcinoma of face   . Attention deficit disorder without mention of hyperactivity   . Unspecified hypothyroidism   . Benign heart murmur   . Esophageal reflux   . Hypertension   . Pulmonary nodule   . Chronic bronchitis   . Wears glasses   . Abnormal blood pressure     left arm from old brachial artery repair  . Subclavian steal syndrome   . Carotid artery disease   . Atherosclerosis of renal artery   . Centrilobular emphysema    Past Surgical History  Procedure Laterality Date  . Appendectomy    . Tonsillectomy and adenoidectomy    . Left brachial artery repair of pseudoaneurysm  2001    post a cath  . Percutanous transluminal angioplasty of renal arteris    .  Lumbar fusion  09/2004    T12-L5 (Dr. Patrice Paradise)  . Cervical fusion  2007    Dr. Valli Glance approach  . Total hip arthroplasty  2008    left  . Shoulder arthroscopy  09/2008    Left  . Orif tibia fracture  2010    Left distal  . Wisdom tooth extraction    . Carpometacarpel suspension plasty Right 08/07/2013    Procedure: [facility]OM[facility]PEL [facility]) SUSPENSION PLASTY RIGHT THUMB;  Surgeon: Wynonia Sours, MD;  Location: [place];  Service: Orthopedics;  Laterality: Right;   Family History  Problem Relation Age of Onset  . Adopted: Yes  . Schizophrenia Son     Deceased 45  . Hypertension Daughter     Renal  . Healthy Son     x1  . Healthy Daughter     x2   History  Substance Use Topics  . Smoking status: Former Smoker -- 1.00 packs/day for 25 years    Types: Cigarettes    Quit date: 03/16/1983  . Smokeless tobacco: Never Used  . Alcohol Use: Yes     Comment: occ-hx alcohol abuse   OB History    No data available     Review of Systems  All other systems reviewed and are negative.   Allergies  Amoxicillin; Codeine;  Hydrocodone-acetaminophen; Hydromorphone hcl; Morphine and related; and Morphine sulfate  Home Medications   Prior to Admission medications   Medication Sig Start Date End Date Taking? Authorizing Provider  amLODipine (NORVASC) 5 MG tablet Take 1 tablet (5 mg total) by mouth daily. 05/28/14   Brunetta Jeans, PA-C  aspirin 81 MG tablet Take 81 mg by mouth daily.     Historical Provider, MD  buPROPion (WELLBUTRIN XL) 300 MG 24 hr tablet TAKE ONE (1) TABLET EACH DAY 01/10/14   Brunetta Jeans, PA-C  ciprofloxacin (CIPRO) 250 MG tablet Take 1 tablet (250 mg total) by mouth 2 (two) times daily. 07/29/14   Brunetta Jeans, PA-C  cloNIDine (CATAPRES) 0.1 MG tablet Take 1 tablet (0.1 mg total) by mouth 2 (two) times daily. 04/12/14   Brunetta Jeans, PA-C  diazepam (VALIUM) 5 MG tablet Take 1 tablet (5 mg total) by mouth every 12 (twelve) hours as  needed for anxiety or muscle spasms (use sparingly). 05/24/14   Brunetta Jeans, PA-C  diclofenac (FLECTOR) 1.3 % PTCH Place 1 patch onto the skin 2 (two) times daily. Dispense one box. 12/21/13   Brunetta Jeans, PA-C  DULoxetine (CYMBALTA) 60 MG capsule TAKE ONE (1) CAPSULE EACH DAY 01/10/14   Brunetta Jeans, PA-C  ferrous sulfate dried (SLOW FE) 160 (50 FE) MG TBCR Take 160 mg by mouth 2 (two) times daily.     Historical Provider, MD  Glucosamine 500 MG TABS Take 1 tablet by mouth 2 (two) times daily. Glucosamine-Chondronton    Historical Provider, MD  L-Methylfolate 15 MG TABS TAKE ONE (1) TABLET EACH DAY 12/09/13   Brunetta Jeans, PA-C  levothyroxine (SYNTHROID, LEVOTHROID) 112 MCG tablet TAKE ONE (1) TABLET BY MOUTH EVERY DAY BEFORE BREAKFAST 06/10/14   Brunetta Jeans, PA-C  oxyCODONE-acetaminophen (ROXICET) 5-325 MG per tablet Take 1 tablet by mouth every 4 (four) hours as needed. 02/12/14   Brunetta Jeans, PA-C  pregabalin (LYRICA) 100 MG capsule Take 1 capsule (100 mg total) by mouth 3 (three) times daily. 02/12/14   Brunetta Jeans, PA-C  solifenacin (VESICARE) 10 MG tablet TAKE ONE (1) TABLET BY MOUTH EVERY DAY 02/04/14   Brunetta Jeans, PA-C  STRATTERA 100 MG capsule TAKE ONE (1) CAPSULE EACH DAY 06/24/14   Brunetta Jeans, PA-C  valsartan (DIOVAN) 160 MG tablet Take 1.5 tablets by mouth daily. 02/04/14   Brunetta Jeans, PA-C   BP 132/66 mmHg  Pulse 72  Temp(Src) 98.1 F (36.7 C) (Oral)  Resp 18  Ht 5\' 3"  (1.6 m)  Wt 110 lb (49.896 kg)  BMI 19.49 kg/m2  SpO2 97%   Physical Exam  General: Well-developed, well-nourished female in no acute distress; appearance consistent with age of record HENT: normocephalic; atraumatic Eyes: pupils equal, round and reactive to light; extraocular muscles intact Neck: supple; nontender Heart: regular rate and rhythm Lungs: clear to auscultation bilaterally Abdomen: soft; nondistended; nontender Extremities: No deformity; tenderness  around right medial malleolus with ecchymosis and swelling over the right lateral malleolus, pain on passive range of motion of right ankle, no proximal fibular tenderness; pulses normal Neurologic: Awake, alert and oriented; motor function intact in all extremities and symmetric; no facial droop Skin: Warm and dry Psychiatric: Normal mood and affect    ED Course  Procedures (including critical care time)   MDM  Nursing notes and vitals signs, including pulse oximetry, reviewed.  Summary of this visit's results, reviewed by myself:  Imaging Studies:  Dg Ankle Complete Right  07/31/2014   CLINICAL DATA:  Fall 8 hours prior, now with right ankle pain and swelling.  EXAM: RIGHT ANKLE - COMPLETE 3+ VIEW  COMPARISON:  05/05/2009  FINDINGS: No fracture or dislocation. The alignment and joint spaces are maintained. Small well corticated osseous density adjacent to the distal fibula, unchanged from prior exam. Tiny plantar calcaneal spur and Achilles tendon enthesophyte. There is mild soft tissue edema laterally.  IMPRESSION: Soft tissue edema without acute fracture or dislocation.   Electronically Signed   By: Jeb Levering M.D.   On: 07/31/2014 04:12      Shanon Rosser, MD 07/31/14 [phone]

## 2014-08-01 DIAGNOSIS — M6281 Muscle weakness (generalized): Secondary | ICD-10-CM | POA: Diagnosis not present

## 2014-08-01 DIAGNOSIS — R2689 Other abnormalities of gait and mobility: Secondary | ICD-10-CM | POA: Diagnosis not present

## 2014-08-07 DIAGNOSIS — M6281 Muscle weakness (generalized): Secondary | ICD-10-CM | POA: Diagnosis not present

## 2014-08-07 DIAGNOSIS — R2689 Other abnormalities of gait and mobility: Secondary | ICD-10-CM | POA: Diagnosis not present

## 2014-08-19 ENCOUNTER — Telehealth: Payer: Self-pay | Admitting: Physician Assistant

## 2014-08-19 NOTE — Telephone Encounter (Signed)
Relation to pt: self  Call back number: [phone]    Reason for call:  Pt states she has been having diarrhea and would like to drop off a specimen. Please advise

## 2014-08-19 NOTE — Telephone Encounter (Signed)
Pt called again to check on status. Can order be entered for specimen?

## 2014-08-20 ENCOUNTER — Other Ambulatory Visit: Payer: Self-pay | Admitting: Physician Assistant

## 2014-08-20 ENCOUNTER — Telehealth: Payer: Self-pay | Admitting: Physician Assistant

## 2014-08-20 ENCOUNTER — Ambulatory Visit: Payer: Medicare Other | Admitting: Physician Assistant

## 2014-08-20 DIAGNOSIS — M6281 Muscle weakness (generalized): Secondary | ICD-10-CM | POA: Diagnosis not present

## 2014-08-20 DIAGNOSIS — R2689 Other abnormalities of gait and mobility: Secondary | ICD-10-CM | POA: Diagnosis not present

## 2014-08-20 NOTE — Telephone Encounter (Addendum)
Called and spoke with the pt and she stated that for the last 2 weeks she have been having diarrhea which only happens in the evening.  She stated that she would have diarrhea for 3 days then stop for 1 day then the diarrhea would start again.  Verbally informed Einar Pheasant of this message.  Cody verbally stated that the pt will need an office visit.  Will not be able to send out the specimen without seeing her.   Informed the pt that Einar Pheasant would like for her to come in for an office visit.  She will need to be evaluated before sending off her specimen.  Pt agreed and scheduled an appt.//AB/CMA

## 2014-08-20 NOTE — Telephone Encounter (Signed)
Please charge.  She made appointment yesterday and canceled less than 24 hours before her appointment time.

## 2014-08-20 NOTE — Telephone Encounter (Signed)
Pt left voicemail at 8:55am canceling her 10:15am appointment stating she does not have transportation and she feels better.

## 2014-08-20 NOTE — Telephone Encounter (Signed)
Rx request to pharmacy/SLS  

## 2014-08-22 DIAGNOSIS — M79641 Pain in right hand: Secondary | ICD-10-CM | POA: Diagnosis not present

## 2014-08-22 DIAGNOSIS — S61219A Laceration without foreign body of unspecified finger without damage to nail, initial encounter: Secondary | ICD-10-CM | POA: Diagnosis not present

## 2014-08-22 DIAGNOSIS — R2689 Other abnormalities of gait and mobility: Secondary | ICD-10-CM | POA: Diagnosis not present

## 2014-08-22 DIAGNOSIS — M6281 Muscle weakness (generalized): Secondary | ICD-10-CM | POA: Diagnosis not present

## 2014-08-27 ENCOUNTER — Ambulatory Visit (HOSPITAL_BASED_OUTPATIENT_CLINIC_OR_DEPARTMENT_OTHER)
Admission: RE | Admit: 2014-08-27 | Discharge: 2014-08-27 | Disposition: A | Discharge status: Home / Self Care | Payer: Medicare Other | Source: Ambulatory Visit | Attending: Orthopedic Surgery | Admitting: Orthopedic Surgery

## 2014-08-27 ENCOUNTER — Encounter (HOSPITAL_BASED_OUTPATIENT_CLINIC_OR_DEPARTMENT_OTHER): Payer: Self-pay | Admitting: Orthopedic Surgery

## 2014-08-27 ENCOUNTER — Encounter (HOSPITAL_BASED_OUTPATIENT_CLINIC_OR_DEPARTMENT_OTHER)
Admission: RE | Disposition: A | Discharge status: Home / Self Care | Payer: Self-pay | Source: Ambulatory Visit | Attending: Orthopedic Surgery

## 2014-08-27 ENCOUNTER — Ambulatory Visit (HOSPITAL_BASED_OUTPATIENT_CLINIC_OR_DEPARTMENT_OTHER): Payer: Medicare Other | Admitting: Anesthesiology

## 2014-08-27 DIAGNOSIS — Y9289 Other specified places as the place of occurrence of the external cause: Secondary | ICD-10-CM | POA: Diagnosis not present

## 2014-08-27 DIAGNOSIS — Z885 Allergy status to narcotic agent status: Secondary | ICD-10-CM | POA: Diagnosis not present

## 2014-08-27 DIAGNOSIS — Z888 Allergy status to other drugs, medicaments and biological substances status: Secondary | ICD-10-CM | POA: Diagnosis not present

## 2014-08-27 DIAGNOSIS — L928 Other granulomatous disorders of the skin and subcutaneous tissue: Secondary | ICD-10-CM | POA: Insufficient documentation

## 2014-08-27 DIAGNOSIS — J439 Emphysema, unspecified: Secondary | ICD-10-CM | POA: Diagnosis not present

## 2014-08-27 DIAGNOSIS — Y998 Other external cause status: Secondary | ICD-10-CM | POA: Diagnosis not present

## 2014-08-27 DIAGNOSIS — Z87891 Personal history of nicotine dependence: Secondary | ICD-10-CM | POA: Diagnosis not present

## 2014-08-27 DIAGNOSIS — I10 Essential (primary) hypertension: Secondary | ICD-10-CM | POA: Diagnosis not present

## 2014-08-27 DIAGNOSIS — F329 Major depressive disorder, single episode, unspecified: Secondary | ICD-10-CM | POA: Insufficient documentation

## 2014-08-27 DIAGNOSIS — Y9389 Activity, other specified: Secondary | ICD-10-CM | POA: Diagnosis not present

## 2014-08-27 DIAGNOSIS — L0889 Other specified local infections of the skin and subcutaneous tissue: Secondary | ICD-10-CM | POA: Diagnosis not present

## 2014-08-27 DIAGNOSIS — I739 Peripheral vascular disease, unspecified: Secondary | ICD-10-CM | POA: Insufficient documentation

## 2014-08-27 DIAGNOSIS — M1610 Unilateral primary osteoarthritis, unspecified hip: Secondary | ICD-10-CM | POA: Diagnosis not present

## 2014-08-27 DIAGNOSIS — E039 Hypothyroidism, unspecified: Secondary | ICD-10-CM | POA: Diagnosis not present

## 2014-08-27 DIAGNOSIS — Z881 Allergy status to other antibiotic agents status: Secondary | ICD-10-CM | POA: Diagnosis not present

## 2014-08-27 DIAGNOSIS — G629 Polyneuropathy, unspecified: Secondary | ICD-10-CM | POA: Insufficient documentation

## 2014-08-27 DIAGNOSIS — F1021 Alcohol dependence, in remission: Secondary | ICD-10-CM | POA: Insufficient documentation

## 2014-08-27 DIAGNOSIS — W458XXA Other foreign body or object entering through skin, initial encounter: Secondary | ICD-10-CM | POA: Insufficient documentation

## 2014-08-27 DIAGNOSIS — Z7982 Long term (current) use of aspirin: Secondary | ICD-10-CM | POA: Diagnosis not present

## 2014-08-27 DIAGNOSIS — K219 Gastro-esophageal reflux disease without esophagitis: Secondary | ICD-10-CM | POA: Insufficient documentation

## 2014-08-27 DIAGNOSIS — Z981 Arthrodesis status: Secondary | ICD-10-CM | POA: Insufficient documentation

## 2014-08-27 DIAGNOSIS — S66324A Laceration of extensor muscle, fascia and tendon of right ring finger at wrist and hand level, initial encounter: Secondary | ICD-10-CM | POA: Diagnosis not present

## 2014-08-27 DIAGNOSIS — Z96642 Presence of left artificial hip joint: Secondary | ICD-10-CM | POA: Insufficient documentation

## 2014-08-27 DIAGNOSIS — T814XXA Infection following a procedure, initial encounter: Secondary | ICD-10-CM | POA: Diagnosis not present

## 2014-08-27 HISTORY — PX: MINOR IRRIGATION AND DEBRIDEMENT OF WOUND: SHX6239

## 2014-08-27 SURGERY — MINOR IRRIGATION AND DEBRIDEMENT OF WOUND
Anesthesia: LOCAL | Site: Finger | Laterality: Right

## 2014-08-27 MED ORDER — LIDOCAINE HCL (PF) 1 % IJ SOLN
INTRAMUSCULAR | Status: AC
  Filled 2014-08-27: qty 30

## 2014-08-27 MED ORDER — OXYCODONE-ACETAMINOPHEN 10-325 MG PO TABS: 1.0000 | ORAL_TABLET | ORAL | Status: DC | PRN

## 2014-08-27 MED ORDER — LIDOCAINE HCL (PF) 1 % IJ SOLN
INTRAMUSCULAR | Status: DC | PRN
  Administered 2014-08-27: 3 mL

## 2014-08-27 MED ORDER — CHLORHEXIDINE GLUCONATE 4 % EX LIQD: 60.0000 mL | Freq: Once | CUTANEOUS | Status: DC

## 2014-08-27 MED ORDER — BUPIVACAINE HCL (PF) 0.25 % IJ SOLN
INTRAMUSCULAR | Status: DC | PRN
Start: 2014-08-27 — End: 2014-08-27
  Administered 2014-08-27: 3 mL

## 2014-08-27 MED ORDER — SULFAMETHOXAZOLE-TRIMETHOPRIM 400-80 MG PO TABS: 1.0000 | ORAL_TABLET | Freq: Two times a day (BID) | ORAL | Status: DC

## 2014-08-27 SURGICAL SUPPLY — 38 items
BLADE MINI RND TIP GREEN BEAV (BLADE) IMPLANT
BLADE SURG 15 STRL LF DISP TIS (BLADE) ×1 IMPLANT
BLADE SURG 15 STRL SS (BLADE) ×2
BNDG CMPR 9X4 STRL LF SNTH (GAUZE/BANDAGES/DRESSINGS) ×1
BNDG COHESIVE 1X5 TAN STRL LF (GAUZE/BANDAGES/DRESSINGS) IMPLANT
BNDG ESMARK 4X9 LF (GAUZE/BANDAGES/DRESSINGS) ×2 IMPLANT
CHLORAPREP W/TINT 26ML (MISCELLANEOUS) ×1 IMPLANT
CORDS BIPOLAR (ELECTRODE) IMPLANT
COVER BACK TABLE 60X90IN (DRAPES) ×2 IMPLANT
COVER MAYO STAND STRL (DRAPES) ×2 IMPLANT
CUFF TOURNIQUET SINGLE 18IN (TOURNIQUET CUFF) ×2 IMPLANT
DRAPE EXTREMITY T 121X128X90 (DRAPE) ×2 IMPLANT
DRAPE SURG 17X23 STRL (DRAPES) ×2 IMPLANT
GAUZE SPONGE 4X4 12PLY STRL (GAUZE/BANDAGES/DRESSINGS) ×2 IMPLANT
GAUZE XEROFORM 1X8 LF (GAUZE/BANDAGES/DRESSINGS) ×2 IMPLANT
GLOVE BIOGEL PI IND STRL 7.0 (GLOVE) IMPLANT
GLOVE BIOGEL PI IND STRL 8.5 (GLOVE) ×1 IMPLANT
GLOVE BIOGEL PI INDICATOR 7.0 (GLOVE) ×1
GLOVE BIOGEL PI INDICATOR 8.5 (GLOVE) ×1
GLOVE ECLIPSE 6.5 STRL STRAW (GLOVE) ×2 IMPLANT
GLOVE EXAM NITRILE MD LF STRL (GLOVE) ×1 IMPLANT
GLOVE SURG ORTHO 8.0 STRL STRW (GLOVE) ×2 IMPLANT
GOWN STRL REUS W/ TWL LRG LVL3 (GOWN DISPOSABLE) ×1 IMPLANT
GOWN STRL REUS W/TWL LRG LVL3 (GOWN DISPOSABLE) ×2
GOWN STRL REUS W/TWL XL LVL3 (GOWN DISPOSABLE) ×2 IMPLANT
NEEDLE PRECISIONGLIDE 27X1.5 (NEEDLE) IMPLANT
NS IRRIG 1000ML POUR BTL (IV SOLUTION) ×2 IMPLANT
PACK BASIN DAY SURGERY FS (CUSTOM PROCEDURE TRAY) ×2 IMPLANT
PADDING CAST ABS 4INX4YD NS (CAST SUPPLIES)
PADDING CAST ABS COTTON 4X4 ST (CAST SUPPLIES) IMPLANT
SPLINT FINGER 5.25 BULB (SOFTGOODS) ×2 IMPLANT
STOCKINETTE 4X48 STRL (DRAPES) ×2 IMPLANT
SUT VIC AB 4-0 P2 18 (SUTURE) IMPLANT
SYR BULB 3OZ (MISCELLANEOUS) ×2 IMPLANT
SYR CONTROL 10ML LL (SYRINGE) ×2 IMPLANT
TOWEL OR 17X24 6PK STRL BLUE (TOWEL DISPOSABLE) ×2 IMPLANT
TRAY DSU PREP LF (CUSTOM PROCEDURE TRAY) ×1 IMPLANT
UNDERPAD 30X30 (UNDERPADS AND DIAPERS) ×2 IMPLANT

## 2014-08-27 NOTE — Anesthesia Preprocedure Evaluation (Deleted)
Anesthesia Evaluation  Patient identified by MRN, date of birth, ID band Patient awake    Reviewed: Allergy & Precautions, NPO status   Airway Mallampati: I  TM Distance: >3 FB Neck ROM: Full    Dental  (+) Teeth Intact, Dental Advisory Given   Pulmonary COPDformer smoker,  breath sounds clear to auscultation        Cardiovascular hypertension, Pt. on medications + Peripheral Vascular Disease Rhythm:Regular Rate:Normal     Neuro/Psych PSYCHIATRIC DISORDERS    GI/Hepatic GERD-  Medicated,  Endo/Other  Hypothyroidism   Renal/GU      Musculoskeletal  (+) Arthritis -,   Abdominal   Peds  Hematology   Anesthesia Other Findings   Reproductive/Obstetrics                             Anesthesia Physical Anesthesia Plan  ASA: II  Anesthesia Plan: General   Post-op Pain Management:    Induction: Intravenous  Airway Management Planned: LMA  Additional Equipment:   Intra-op Plan:   Post-operative Plan: Extubation in OR  Informed Consent: I have reviewed the patients History and Physical, chart, labs and discussed the procedure including the risks, benefits and alternatives for the proposed anesthesia with the patient or authorized representative who has indicated his/her understanding and acceptance.   Dental advisory given  Plan Discussed with: CRNA and Surgeon  Anesthesia Plan Comments:         Anesthesia Quick Evaluation

## 2014-08-27 NOTE — Op Note (Signed)
Dictation Number [id number]

## 2014-08-27 NOTE — H&P (Addendum)
Rose [name] is a 74 year old female who incurred a laceration to her right  ring  finger, This was closed at urgent care one week agobut has become inflamed , swollen and painful.Marland Kitchen   PAST MEDICAL HISTORY: She is allergic to Morphine. She is on the following medications: Diovan, Amlodipine, Synthroid, Oxybutynin, Nexium, Buperpion, and Cymbalta. She has had the past surgeries: Left hip replacement, cervical spine, spine fusion.  FAMILY H ISTORY: Unknown.  SOCIAL HISTORY: She does not smoke or drink. She is retired. Rose [name] is an 74 y.o. female.   Chief Complaint: swollen right ring finger HPI: see abpve  Past Medical History  Diagnosis Date  . Other tenosynovitis of hand and wrist   . Peripheral neuropathy   . Personal history of alcoholism   . DJD (degenerative joint disease) of hip   . Myalgia and myositis, unspecified   . History of depression   . Morton's neuroma     Hx of, Left  . Carotid bruit     Left  . History of spinal fusion   . Personal history of peptic ulcer disease   . Solitary cyst of breast   . Basal cell carcinoma of face   . Attention deficit disorder without mention of hyperactivity   . Unspecified hypothyroidism   . Benign heart murmur   . Esophageal reflux   . Hypertension   . Pulmonary nodule   . Chronic bronchitis   . Wears glasses   . Abnormal blood pressure     left arm from old brachial artery repair  . Subclavian steal syndrome   . Carotid artery disease   . Atherosclerosis of renal artery   . Centrilobular emphysema     Past Surgical History  Procedure Laterality Date  . Appendectomy    . Tonsillectomy and adenoidectomy    . Left brachial artery repair of pseudoaneurysm  2001    post a cath  . Percutanous transluminal angioplasty of renal arteris    . Lumbar fusion  09/2004    T12-L5 (Dr. Patrice Paradise)  . Cervical fusion  2007    Dr. Valli Glance approach  . Total hip arthroplasty  2008    left  . Shoulder arthroscopy  09/2008   Left  . Orif tibia fracture  2010    Left distal  . Wisdom tooth extraction    . Carpometacarpel suspension plasty Right 08/07/2013    Procedure: CARPOMETACARPEL [facility]) SUSPENSION PLASTY RIGHT THUMB;  Surgeon: Wynonia Sours, MD;  Location: [place];  Service: Orthopedics;  Laterality: Right;    Family History  Problem Relation Age of Onset  . Adopted: Yes  . Schizophrenia Son     Deceased 93  . Hypertension Daughter     Renal  . Healthy Son     x1  . Healthy Daughter     x2   Social History:  reports that she quit smoking about 31 years ago. Her smoking use included Cigarettes. She has a 25 pack-year smoking history. She has never used smokeless tobacco. She reports that she drinks alcohol. She reports that she does not use illicit drugs.  Allergies:  Allergies  Allergen Reactions  . Amoxicillin     REACTION: unspecified  . Codeine   . Hydrocodone-Acetaminophen     REACTION: itching  . Hydromorphone Hcl   . Morphine And Related Hives and Itching  . Morphine Sulfate     REACTION: unspecified  . Nsaids     Pt can't remember  what reaction she had    Medications Prior to Admission  Medication Sig Dispense Refill  . amLODipine (NORVASC) 5 MG tablet Take 1 tablet (5 mg total) by mouth daily. 90 tablet 0  . aspirin 81 MG tablet Take 81 mg by mouth daily.     Marland Kitchen buPROPion (WELLBUTRIN XL) 300 MG 24 hr tablet TAKE ONE (1) TABLET EACH DAY 90 tablet 2  . cloNIDine (CATAPRES) 0.1 MG tablet TAKE ONE TABLET TWICE DAILY 60 tablet 1  . diazepam (VALIUM) 5 MG tablet Take 1 tablet (5 mg total) by mouth every 12 (twelve) hours as needed for anxiety or muscle spasms (use sparingly). 30 tablet 2  . DULoxetine (CYMBALTA) 60 MG capsule TAKE ONE (1) CAPSULE EACH DAY 90 capsule 2  . ferrous sulfate dried (SLOW FE) 160 (50 FE) MG TBCR Take 160 mg by mouth 2 (two) times daily.     . Glucosamine 500 MG TABS Take 1 tablet by mouth 2 (two) times daily. Glucosamine-Chondronton    .  L-Methylfolate 15 MG TABS TAKE ONE (1) TABLET EACH DAY 30 tablet 3  . levothyroxine (SYNTHROID, LEVOTHROID) 112 MCG tablet TAKE ONE (1) TABLET BY MOUTH EVERY DAY BEFORE BREAKFAST 90 tablet 0  . STRATTERA 100 MG capsule TAKE ONE (1) CAPSULE EACH DAY 30 capsule 3  . valsartan (DIOVAN) 160 MG tablet Take 1.5 tablets by mouth daily. 135 tablet 1  . VESICARE 10 MG tablet TAKE ONE (1) TABLET EACH DAY 90 tablet 0  . ciprofloxacin (CIPRO) 250 MG tablet Take 1 tablet (250 mg total) by mouth 2 (two) times daily. 6 tablet 0  . diclofenac (FLECTOR) 1.3 % PTCH Place 1 patch onto the skin 2 (two) times daily. Dispense one box. 5 patch 0  . oxyCODONE-acetaminophen (ROXICET) 5-325 MG per tablet Take 1 tablet by mouth every 4 (four) hours as needed (for pain; may cause constipation). 20 tablet 0  . pregabalin (LYRICA) 100 MG capsule Take 1 capsule (100 mg total) by mouth 3 (three) times daily. 90 capsule 1    No results found for this or any previous visit (from the past 48 hour(s)).  No results found.   Pertinent items are noted in HPI.  Blood pressure 113/72, pulse 74, temperature 98 F (36.7 C), temperature source Oral, resp. rate 20, SpO2 96 %.  General appearance: alert, cooperative and appears stated age Head: Normocephalic, without obvious abnormality Neck: no JVD Resp: clear to auscultation bilaterally Cardio: regular rate and rhythm, S1, S2 normal, no murmur, click, rub or gallop GI: soft, non-tender; bowel sounds normal; no masses,  no organomegaly Extremities: laceration with selling left ring finger Pulses: 2+ and symmetric Skin: Skin color, texture, turgor normal. No rashes or lesions Neurologic: Grossly normal Incision/Wound: Infected laceration left ring finger  Assessment/Plan Infected laceration right ring finger Plan I@D  left ring finger  Rose [name] R 08/27/2014, 12:00 PM

## 2014-08-27 NOTE — Brief Op Note (Signed)
08/27/2014  12:41 PM  PATIENT:  Rose [name]  74 y.o. female  PRE-OPERATIVE DIAGNOSIS:  foreign body right index   POST-OPERATIVE DIAGNOSIS:  * No post-op diagnosis entered *  PROCEDURE:  Procedure(s): FOREIGN BODY REMOVAL ADULT (Right) IRRIGATION AND DEBRIDEMENT EXTREMITY (Right)  SURGEON:  Surgeon(s) and Role:    * Daryll Brod, MD - Primary  PHYSICIAN ASSISTANT:   ASSISTANTS: none   ANESTHESIA:   local  EBL:     BLOOD ADMINISTERED:none  DRAINS: none   LOCAL MEDICATIONS USED:  BUPIVICAINE and lidocaine  SPECIMEN:  Source of Specimen:  culture  DISPOSITION OF SPECIMEN:  PATHOLOGY  COUNTS:  YES  TOURNIQUET:   Total Tourniquet Time Documented: Upper Arm (Right) - 8 minutes Total: Upper Arm (Right) - 8 minutes   DICTATION: .Other Dictation: Dictation Number [id number]  PLAN OF CARE: Discharge to home after PACU  PATIENT DISPOSITION:  PACU - hemodynamically stable.

## 2014-08-27 NOTE — Discharge Instructions (Addendum)

## 2014-08-28 ENCOUNTER — Encounter (HOSPITAL_BASED_OUTPATIENT_CLINIC_OR_DEPARTMENT_OTHER): Payer: Self-pay | Admitting: Orthopedic Surgery

## 2014-08-28 NOTE — Op Note (Signed)
Rose [name], Rose [name]              ACCOUNT NO.:  [id number]  MEDICAL RECORD NO.:  [id number]  LOCATION:                               FACILITY:  [facility]  PHYSICIAN:  Daryll Brod, M.D.       DATE OF BIRTH:  06-03-1940  DATE OF PROCEDURE:  08/27/2014 DATE OF DISCHARGE:  08/27/2014                              OPERATIVE REPORT   PREOPERATIVE DIAGNOSIS:  Infection status post laceration, right ring finger.  POSTOPERATIVE DIAGNOSIS:  Infection status post laceration, right ring finger plus laceration extensor tendon, right ring finger.  OPERATION:  Incision and drainage with cultures.  SURGEON:  Daryll Brod, MD  ANESTHESIA:  Metacarpal block with bupivacaine and lidocaine, 0.25% bupivacaine, 1% lidocaine, both without epinephrine.  HISTORY:  The patient is a 74 year old female, who suffered laceration at the dorsal aspect of the proximal phalanx, midshaft of her right ring finger.  This was repaired approximately 1 week ago, swelled up over the weekend, has become more red and painful for her.  X-rays reveal a questionable foreign body on the volar radial aspect of the finger.  The laceration is entirely dorsal with a slight ulnar drift to it.  Plan is for incision and drainage.  She is aware that there is no guarantee with surgery; possibility of infection; recurrence of injury to arteries, nerves, tendons; possibility that an extensor tendon laceration has occurred.  In the preoperative area, the patient was seen, the extremity marked by both patient and surgeon.  PROCEDURE IN DETAIL:  The patient was brought to the operating room, where a metacarpal block was given with 0.25% bupivacaine, 1% Xylocaine, both without epinephrine, 6 mL was used.  After adequate anesthesia was afforded, she was prepped using ChloraPrep, and that the wound was essentially closed, a tourniquet was placed high and the arm was inflated to 250 mmHg after elevation for exsanguination.  The incision was  opened with blunt and sharp dissection.  The granulation tissue was present, this was excised and sent to Pathology with cultures.  There was a moderate amount of cloudy fluid, this was sent to cultures, both aerobic and anaerobic.  The extensor tendon was found to be lacerated, retracted, this was not repaired.  At this point in time, due to the changes in the surrounding tissue, an exploration was then performed to the radial side.  No loose foreign material was encountered.  The wound was copiously irrigated with saline, packed with Iodoform gauze. Sterile compressive dressing and splint applied.  On deflation of the tourniquet, all fingers immediately pinked.  She was taken to the recovery room for observation in satisfactory condition.  She will be discharged home to return to the [facility] in 1 week on oxycodone and Septra DS.    ______________________________ Daryll Brod, M.D.   ______________________________ Daryll Brod, M.D.    GK/MEDQ  D:  08/27/2014  T:  08/28/2014  Job:  [id number]

## 2014-08-29 DIAGNOSIS — S61401D Unspecified open wound of right hand, subsequent encounter: Secondary | ICD-10-CM | POA: Diagnosis not present

## 2014-08-29 DIAGNOSIS — S66324D Laceration of extensor muscle, fascia and tendon of right ring finger at wrist and hand level, subsequent encounter: Secondary | ICD-10-CM | POA: Diagnosis not present

## 2014-08-29 DIAGNOSIS — M79644 Pain in right finger(s): Secondary | ICD-10-CM | POA: Diagnosis not present

## 2014-08-30 LAB — WOUND CULTURE: Culture: NO GROWTH

## 2014-09-01 LAB — ANAEROBIC CULTURE

## 2014-09-02 DIAGNOSIS — S66324D Laceration of extensor muscle, fascia and tendon of right ring finger at wrist and hand level, subsequent encounter: Secondary | ICD-10-CM | POA: Diagnosis not present

## 2014-09-02 DIAGNOSIS — M79644 Pain in right finger(s): Secondary | ICD-10-CM | POA: Diagnosis not present

## 2014-09-02 DIAGNOSIS — S61401D Unspecified open wound of right hand, subsequent encounter: Secondary | ICD-10-CM | POA: Diagnosis not present

## 2014-09-04 ENCOUNTER — Telehealth: Payer: Self-pay | Admitting: Physician Assistant

## 2014-09-04 DIAGNOSIS — M6281 Muscle weakness (generalized): Secondary | ICD-10-CM | POA: Diagnosis not present

## 2014-09-04 DIAGNOSIS — G629 Polyneuropathy, unspecified: Secondary | ICD-10-CM

## 2014-09-04 DIAGNOSIS — R2689 Other abnormalities of gait and mobility: Secondary | ICD-10-CM | POA: Diagnosis not present

## 2014-09-04 MED ORDER — PREGABALIN 100 MG PO CAPS: 100.0000 mg | ORAL_CAPSULE | Freq: Three times a day (TID) | ORAL | Status: DC

## 2014-09-04 NOTE — Telephone Encounter (Signed)
Caller name: Rodman Key at [facility] on [address] Can be reached: [phone]  Reason for call: Pt transferring from [facility] to [facility]. She requested refill on gabapentin (NEURONTIN) 600 MG tablet . I advised Rodman Key that the RX was previously discontinued. Should pt still be taking gabapentin (NEURONTIN) 600 MG tablet ? If so please send in RX to [facility].

## 2014-09-04 NOTE — Telephone Encounter (Signed)
Patient should be taking Lyrica and not Gabapentin. Refill not appropriate.  Patient should contact our office regarding refill

## 2014-09-04 NOTE — Telephone Encounter (Signed)
Medication has been prescribed since December of last year so that would be what she has been taking. Refill granted.  Will need follow-up before additional refills.

## 2014-09-04 NOTE — Telephone Encounter (Signed)
Notified pt to take Lyrica and not Gabapentin. Pt states she was unaware of the change but out of Lyrica as well. Please send refill to [facility] on [address].

## 2014-09-06 DIAGNOSIS — S66324D Laceration of extensor muscle, fascia and tendon of right ring finger at wrist and hand level, subsequent encounter: Secondary | ICD-10-CM | POA: Diagnosis not present

## 2014-09-06 DIAGNOSIS — M79644 Pain in right finger(s): Secondary | ICD-10-CM | POA: Diagnosis not present

## 2014-09-06 DIAGNOSIS — S61401D Unspecified open wound of right hand, subsequent encounter: Secondary | ICD-10-CM | POA: Diagnosis not present

## 2014-09-09 ENCOUNTER — Other Ambulatory Visit: Payer: Self-pay

## 2014-09-09 DIAGNOSIS — M79644 Pain in right finger(s): Secondary | ICD-10-CM | POA: Diagnosis not present

## 2014-09-09 DIAGNOSIS — S66324D Laceration of extensor muscle, fascia and tendon of right ring finger at wrist and hand level, subsequent encounter: Secondary | ICD-10-CM | POA: Diagnosis not present

## 2014-09-09 DIAGNOSIS — S61401D Unspecified open wound of right hand, subsequent encounter: Secondary | ICD-10-CM | POA: Diagnosis not present

## 2014-09-11 DIAGNOSIS — M6281 Muscle weakness (generalized): Secondary | ICD-10-CM | POA: Diagnosis not present

## 2014-09-11 DIAGNOSIS — R2689 Other abnormalities of gait and mobility: Secondary | ICD-10-CM | POA: Diagnosis not present

## 2014-09-12 DIAGNOSIS — M79644 Pain in right finger(s): Secondary | ICD-10-CM | POA: Diagnosis not present

## 2014-09-12 DIAGNOSIS — S61401D Unspecified open wound of right hand, subsequent encounter: Secondary | ICD-10-CM | POA: Diagnosis not present

## 2014-09-12 DIAGNOSIS — S66324D Laceration of extensor muscle, fascia and tendon of right ring finger at wrist and hand level, subsequent encounter: Secondary | ICD-10-CM | POA: Diagnosis not present

## 2014-09-18 ENCOUNTER — Other Ambulatory Visit: Payer: Self-pay | Admitting: Physician Assistant

## 2014-09-18 ENCOUNTER — Ambulatory Visit (INDEPENDENT_AMBULATORY_CARE_PROVIDER_SITE_OTHER): Payer: Medicare Other | Admitting: Physician Assistant

## 2014-09-18 ENCOUNTER — Encounter: Payer: Self-pay | Admitting: Physician Assistant

## 2014-09-18 VITALS — BP 126/80 | HR 72 | Temp 98.0°F | Resp 16 | Wt 108.5 lb

## 2014-09-18 DIAGNOSIS — F32A Depression, unspecified: Secondary | ICD-10-CM

## 2014-09-18 DIAGNOSIS — G609 Hereditary and idiopathic neuropathy, unspecified: Secondary | ICD-10-CM

## 2014-09-18 DIAGNOSIS — F329 Major depressive disorder, single episode, unspecified: Secondary | ICD-10-CM

## 2014-09-18 MED ORDER — SERTRALINE HCL 100 MG PO TABS: 100.0000 mg | ORAL_TABLET | Freq: Every day | ORAL | Status: DC

## 2014-09-18 MED ORDER — GABAPENTIN 600 MG PO TABS: 600.0000 mg | ORAL_TABLET | Freq: Three times a day (TID) | ORAL | Status: DC

## 2014-09-18 NOTE — Patient Instructions (Signed)
Please stop the Lyrica and resume the Gabapentin as directed. We are going to start the Sertraline instead of the Wellbutrin for mood and focus. Continue the clonidine and Cymbalta.   Follow-up with me in 2 months.

## 2014-09-18 NOTE — Progress Notes (Signed)
Pre visit review using our clinic review tool, if applicable. No additional management support is needed unless otherwise documented below in the visit note. 

## 2014-09-19 ENCOUNTER — Other Ambulatory Visit: Payer: Self-pay | Admitting: Orthopedic Surgery

## 2014-09-22 NOTE — Assessment & Plan Note (Signed)
Continue Cymbalta. Will begin Sertraline 100 mg daily. Follow-up 1 month.

## 2014-09-22 NOTE — Progress Notes (Signed)
Patient presents to clinic today for medication management. Patient has questions about why she was taken off of her Gabapentin and started on Lyrica for neuropathy. Patient also notes Wellbutrin sub-therapeutic for depressive symptoms. Remains on her Cymbalta daily but states she stopped her Wellbutrin as it was ineffective. Denies SI/HI.  Past Medical History  Diagnosis Date  . Other tenosynovitis of hand and wrist   . Peripheral neuropathy   . Personal history of alcoholism   . DJD (degenerative joint disease) of hip   . Myalgia and myositis, unspecified   . History of depression   . Morton's neuroma     Hx of, Left  . Carotid bruit     Left  . History of spinal fusion   . Personal history of peptic ulcer disease   . Solitary cyst of breast   . Basal cell carcinoma of face   . Attention deficit disorder without mention of hyperactivity   . Unspecified hypothyroidism   . Benign heart murmur   . Esophageal reflux   . Hypertension   . Pulmonary nodule   . Chronic bronchitis   . Wears glasses   . Abnormal blood pressure     left arm from old brachial artery repair  . Subclavian steal syndrome   . Carotid artery disease   . Atherosclerosis of renal artery   . Centrilobular emphysema     Current Outpatient Prescriptions on File Prior to Visit  Medication Sig Dispense Refill  . aspirin 81 MG tablet Take 81 mg by mouth daily.     . cloNIDine (CATAPRES) 0.1 MG tablet TAKE ONE TABLET TWICE DAILY 60 tablet 1  . diazepam (VALIUM) 5 MG tablet Take 1 tablet (5 mg total) by mouth every 12 (twelve) hours as needed for anxiety or muscle spasms (use sparingly). 30 tablet 2  . diclofenac (FLECTOR) 1.3 % PTCH Place 1 patch onto the skin 2 (two) times daily. Dispense one box. 5 patch 0  . DULoxetine (CYMBALTA) 60 MG capsule TAKE ONE (1) CAPSULE EACH DAY 90 capsule 2  . ferrous sulfate dried (SLOW FE) 160 (50 FE) MG TBCR Take 160 mg by mouth 2 (two) times daily.     . Glucosamine 500 MG  TABS Take 1 tablet by mouth 2 (two) times daily. Glucosamine-Chondronton    . L-Methylfolate 15 MG TABS TAKE ONE (1) TABLET EACH DAY 30 tablet 3  . oxyCODONE-acetaminophen (PERCOCET) 10-325 MG per tablet Take 1 tablet by mouth every 4 (four) hours as needed for pain. 30 tablet 0  . oxyCODONE-acetaminophen (ROXICET) 5-325 MG per tablet Take 1 tablet by mouth every 4 (four) hours as needed (for pain; may cause constipation). 20 tablet 0  . STRATTERA 100 MG capsule TAKE ONE (1) CAPSULE EACH DAY 30 capsule 3  . valsartan (DIOVAN) 160 MG tablet Take 1.5 tablets by mouth daily. 135 tablet 1  . VESICARE 10 MG tablet TAKE ONE (1) TABLET EACH DAY 90 tablet 0  . ciprofloxacin (CIPRO) 250 MG tablet Take 1 tablet (250 mg total) by mouth 2 (two) times daily. (Patient not taking: Reported on 09/18/2014) 6 tablet 0  . sulfamethoxazole-trimethoprim (BACTRIM) 400-80 MG per tablet Take 1 tablet by mouth 2 (two) times daily. (Patient not taking: Reported on 09/18/2014) 20 tablet 0   No current facility-administered medications on file prior to visit.    Allergies  Allergen Reactions  . Amoxicillin     REACTION: unspecified  . Codeine   . Hydrocodone-Acetaminophen  REACTION: itching  . Hydromorphone Hcl   . Morphine And Related Hives and Itching  . Morphine Sulfate     REACTION: unspecified  . Nsaids     Pt can't remember what reaction she had    Family History  Problem Relation Age of Onset  . Adopted: Yes  . Schizophrenia Son     Deceased 27  . Hypertension Daughter     Renal  . Healthy Son     x1  . Healthy Daughter     x2    History   Social History  . Marital Status: Single    Spouse Name: N/A  . Number of Children: Y  . Years of Education: N/A   Occupational History  . retired     Engineer, maintenance (IT), had own firm   Social History Main Topics  . Smoking status: Former Smoker -- 1.00 packs/day for 25 years    Types: Cigarettes    Quit date: 03/16/1983  . Smokeless tobacco: Never Used  .  Alcohol Use: Yes     Comment: occ-hx alcohol abuse  . Drug Use: No  . Sexual Activity: Not on file   Other Topics Concern  . 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                   Review of Systems - See HPI.  All other ROS are negative.  BP 126/80 mmHg  Pulse 72  Temp(Src) 98 F (36.7 C) (Oral)  Resp 16  Wt 108 lb 8 oz (49.215 kg)  SpO2 97%  Physical Exam  Constitutional: She is oriented to person, place, and time and well-developed, well-nourished, and in no distress.  HENT:  Head: Normocephalic and atraumatic.  Cardiovascular: Normal rate, regular rhythm, normal heart sounds and intact distal pulses.   Pulmonary/Chest: Effort normal and breath sounds normal. No respiratory distress. She has no wheezes. She has no rales. She exhibits no tenderness.  Neurological: She is alert and oriented to person, place, and time.  Skin: Skin is warm and dry. No rash noted.  Psychiatric: Affect normal.  Vitals reviewed.   Recent Results (from the past 2160 hour(s))  Anaerobic culture     Status: None   Collection Time: 08/27/14  2:48 PM  Result Value Ref Range   Specimen Description WOUND RIGHT FINGER    Special Requests RIGHT RING FINGER SPECIMEN A    Gram Stain      RARE WBC PRESENT, PREDOMINANTLY PMN NO SQUAMOUS EPITHELIAL CELLS SEEN NO ORGANISMS SEEN Performed at [facility]    Culture      NO ANAEROBES ISOLATED Performed at [facility]    Report Status 09/01/2014 FINAL   Fungus Culture with Smear     Status: None (Preliminary result)   Collection Time: 08/27/14  2:50 PM  Result Value Ref Range   Specimen Description WOUND RIGHT FINGER    Special Requests RIGHT RING FINGER SPECIMEN A      Fungal Smear      NO YEAST OR FUNGAL ELEMENTS SEEN Performed at [facility]    Culture      CULTURE IN PROGRESS FOR FOUR WEEKS Performed  at [facility]    Report Status PENDING   Wound culture     Status: None   Collection Time: 08/27/14  2:51 PM  Result Value Ref Range   Specimen Description WOUND RIGHT FINGER    Special Requests RIGHT RING FINGER SPECIMEN A     Gram Stain      RARE WBC PRESENT, PREDOMINANTLY PMN NO SQUAMOUS EPITHELIAL CELLS SEEN NO ORGANISMS SEEN Performed at [facility]    Culture      NO GROWTH 2 DAYS Performed at [facility]    Report Status 08/30/2014 FINAL     Assessment/Plan: Depression Continue Cymbalta. Will begin Sertraline 100 mg daily. Follow-up 1 month.  Hereditary and idiopathic peripheral neuropathy Records reviewed and shared with patient where at her December 2015 visit she had felt Gabapentin was not effective and was making her jittery. This was when the decision was to try Lyrica. Patient has been on medication for 6 months and had previously endorses improvement in symptoms. Now feels Lyrica is sub therapeutic and costly and refuses to take anymore. Wishes to go back on Gabapentin. Unfortunately this seems to be a common theme with patient, wanting to switch medications back and forth. Will restart Gabapentin at former dose but discussed with patient that if she notes not effective we will need to titrate dose or get Neurology on board.

## 2014-09-22 NOTE — Assessment & Plan Note (Signed)
Records reviewed and shared with patient where at her December 2015 visit she had felt Gabapentin was not effective and was making her jittery. This was when the decision was to try Lyrica. Patient has been on medication for 6 months and had previously endorses improvement in symptoms. Now feels Lyrica is sub therapeutic and costly and refuses to take anymore. Wishes to go back on Gabapentin. Unfortunately this seems to be a common theme with patient, wanting to switch medications back and forth. Will restart Gabapentin at former dose but discussed with patient that if she notes not effective we will need to titrate dose or get Neurology on board.

## 2014-09-23 ENCOUNTER — Encounter: Payer: Self-pay | Admitting: Physician Assistant

## 2014-09-23 ENCOUNTER — Ambulatory Visit (INDEPENDENT_AMBULATORY_CARE_PROVIDER_SITE_OTHER): Payer: Medicare Other | Admitting: Physician Assistant

## 2014-09-23 VITALS — BP 123/57 | HR 73 | Ht 63.0 in | Wt 110.6 lb

## 2014-09-23 DIAGNOSIS — H6123 Impacted cerumen, bilateral: Secondary | ICD-10-CM

## 2014-09-23 DIAGNOSIS — H6120 Impacted cerumen, unspecified ear: Secondary | ICD-10-CM | POA: Insufficient documentation

## 2014-09-23 DIAGNOSIS — M6281 Muscle weakness (generalized): Secondary | ICD-10-CM | POA: Diagnosis not present

## 2014-09-23 DIAGNOSIS — R2689 Other abnormalities of gait and mobility: Secondary | ICD-10-CM | POA: Diagnosis not present

## 2014-09-23 LAB — FUNGUS CULTURE W SMEAR: Fungal Smear: NONE SEEN

## 2014-09-23 NOTE — Progress Notes (Signed)
Patient presents to clinic today c/o cerumen impaction of ears bilaterally noted by Audiologist at her assisted living facility. States she was told ears need to be cleaned out so that she can have audiology examination.  Denies ear pain or pressure. Denies drainage from ear.  Past Medical History  Diagnosis Date  . Other tenosynovitis of hand and wrist   . Peripheral neuropathy   . Personal history of alcoholism   . DJD (degenerative joint disease) of hip   . Myalgia and myositis, unspecified   . History of depression   . Morton's neuroma     Hx of, Left  . Carotid bruit     Left  . History of spinal fusion   . Personal history of peptic ulcer disease   . Solitary cyst of breast   . Basal cell carcinoma of face   . Attention deficit disorder without mention of hyperactivity   . Unspecified hypothyroidism   . Benign heart murmur   . Esophageal reflux   . Hypertension   . Pulmonary nodule   . Chronic bronchitis   . Wears glasses   . Abnormal blood pressure     left arm from old brachial artery repair  . Subclavian steal syndrome   . Carotid artery disease   . Atherosclerosis of renal artery   . Centrilobular emphysema     Current Outpatient Prescriptions on File Prior to Visit  Medication Sig Dispense Refill  . amLODipine (NORVASC) 10 MG tablet TAKE 1 TABLET DAILY 90 tablet 1  . aspirin 81 MG tablet Take 81 mg by mouth daily.     . ciprofloxacin (CIPRO) 250 MG tablet Take 1 tablet (250 mg total) by mouth 2 (two) times daily. 6 tablet 0  . cloNIDine (CATAPRES) 0.1 MG tablet TAKE ONE TABLET TWICE DAILY 60 tablet 1  . diazepam (VALIUM) 5 MG tablet Take 1 tablet (5 mg total) by mouth every 12 (twelve) hours as needed for anxiety or muscle spasms (use sparingly). 30 tablet 2  . diclofenac (FLECTOR) 1.3 % PTCH Place 1 patch onto the skin 2 (two) times daily. Dispense one box. 5 patch 0  . DULoxetine (CYMBALTA) 60 MG capsule TAKE ONE (1) CAPSULE EACH DAY 90 capsule 2  . ferrous  sulfate dried (SLOW FE) 160 (50 FE) MG TBCR Take 160 mg by mouth 2 (two) times daily.     Marland Kitchen gabapentin (NEURONTIN) 600 MG tablet Take 1 tablet (600 mg total) by mouth 3 (three) times daily. 90 tablet 1  . Glucosamine 500 MG TABS Take 1 tablet by mouth 2 (two) times daily. Glucosamine-Chondronton    . L-Methylfolate 15 MG TABS TAKE ONE (1) TABLET EACH DAY 30 tablet 3  . levothyroxine (SYNTHROID, LEVOTHROID) 112 MCG tablet TAKE 1 TABLET (112 MCG TOTAL) BY MOUTH DAILY BEFORE BREAKFAST. 30 tablet 3  . oxyCODONE-acetaminophen (PERCOCET) 10-325 MG per tablet Take 1 tablet by mouth every 4 (four) hours as needed for pain. 30 tablet 0  . oxyCODONE-acetaminophen (ROXICET) 5-325 MG per tablet Take 1 tablet by mouth every 4 (four) hours as needed (for pain; may cause constipation). 20 tablet 0  . sertraline (ZOLOFT) 100 MG tablet Take 1 tablet (100 mg total) by mouth daily. 30 tablet 3  . STRATTERA 100 MG capsule TAKE ONE (1) CAPSULE EACH DAY 30 capsule 3  . valsartan (DIOVAN) 160 MG tablet Take 1.5 tablets by mouth daily. 135 tablet 1  . VESICARE 10 MG tablet TAKE ONE (1) TABLET EACH DAY  90 tablet 0   No current facility-administered medications on file prior to visit.    Allergies  Allergen Reactions  . Amoxicillin     REACTION: unspecified  . Codeine   . Hydrocodone-Acetaminophen     REACTION: itching  . Hydromorphone Hcl   . Morphine And Related Hives and Itching  . Morphine Sulfate     REACTION: unspecified  . Nsaids     Pt can't remember what reaction she had    Family History  Problem Relation Age of Onset  . Adopted: Yes  . Schizophrenia Son     Deceased 58  . Hypertension Daughter     Renal  . Healthy Son     x1  . Healthy Daughter     x2    History   Social History  . Marital Status: Single    Spouse Name: N/A  . Number of Children: Y  . Years of Education: N/A   Occupational History  . retired     Engineer, maintenance (IT), had own firm   Social History Main Topics  . Smoking  status: Former Smoker -- 1.00 packs/day for 25 years    Types: Cigarettes    Quit date: 03/16/1983  . Smokeless tobacco: Never Used  . Alcohol Use: Yes     Comment: occ-hx alcohol abuse  . Drug Use: No  . Sexual Activity: Not on file   Other Topics Concern  . 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                  Review of Systems - See HPI.  All other ROS are negative.  BP 123/57 mmHg  Pulse 73  Ht 5\' 3"  (1.6 m)  Wt 110 lb 9.6 oz (50.168 kg)  BMI 19.60 kg/m2  SpO2 99%  Physical Exam  Constitutional: She is oriented to person, place, and time and well-developed, well-nourished, and in no distress.  HENT:  Head: Normocephalic and atraumatic.  Cerumen impaction bilaterally. R sided impaction removed successfully with curette. L sided cannot be removed with curette. Will need irrigation.  Cardiovascular: Normal rate, regular rhythm, normal heart sounds and intact distal pulses.   Pulmonary/Chest: Effort normal.  Neurological: She is alert and oriented to person, place, and time.  Skin: Skin is warm and dry. No rash noted.  Psychiatric: Affect normal.  Vitals reviewed.   Recent Results (from the past 2160 hour(s))  Anaerobic culture     Status: None   Collection Time: 08/27/14  2:48 PM  Result Value Ref Range   Specimen Description WOUND RIGHT FINGER    Special Requests RIGHT RING FINGER SPECIMEN A    Gram Stain      RARE WBC PRESENT, PREDOMINANTLY PMN NO SQUAMOUS EPITHELIAL CELLS SEEN NO ORGANISMS SEEN Performed at [facility]    Culture      NO ANAEROBES ISOLATED Performed at [facility]    Report Status 09/01/2014 FINAL   Fungus Culture with Smear     Status: None   Collection Time:  08/27/14  2:50 PM  Result Value Ref Range   Specimen Description WOUND RIGHT FINGER    Special Requests RIGHT RING FINGER  SPECIMEN A     Fungal Smear      NO YEAST OR FUNGAL ELEMENTS SEEN Performed at [facility]    Culture      No Fungi Isolated in 4 Weeks Performed at [facility]    Report Status 09/23/2014 FINAL   Wound culture     Status: None   Collection Time: 08/27/14  2:51 PM  Result Value Ref Range   Specimen Description WOUND RIGHT FINGER    Special Requests RIGHT RING FINGER SPECIMEN A     Gram Stain      RARE WBC PRESENT, PREDOMINANTLY PMN NO SQUAMOUS EPITHELIAL CELLS SEEN NO ORGANISMS SEEN Performed at [facility]    Culture      NO GROWTH 2 DAYS Performed at [facility]    Report Status 08/30/2014 FINAL     Assessment/Plan: Cerumen impaction Bilateral. R side impaction removed via curette. Left-sided impaction removed via irrigation. Home instructions given. Follow-up PRN.

## 2014-09-23 NOTE — Assessment & Plan Note (Addendum)
Bilateral. R side impaction removed via curette. Left-sided impaction removed via irrigation. Home instructions given. Follow-up PRN.

## 2014-09-23 NOTE — Patient Instructions (Signed)
Cerumen Impaction °A cerumen impaction is when the wax in your ear forms a plug. This plug usually causes reduced hearing. Sometimes it also causes an earache or dizziness. Removing a cerumen impaction can be difficult and painful. The wax sticks to the ear canal. The canal is sensitive and bleeds easily. If you try to remove a heavy wax buildup with a cotton tipped swab, you may push it in further. °Irrigation with water, suction, and small ear curettes may be used to clear out the wax. If the impaction is fixed to the skin in the ear canal, ear drops may be needed for a few days to loosen the wax. People who build up a lot of wax frequently can use ear wax removal products available in your local drugstore. °SEEK MEDICAL CARE IF:  °You develop an earache, increased hearing loss, or marked dizziness. °Document Released: 04/08/2004 Document Revised: 05/24/2011 Document Reviewed: 05/29/2009 °[employer]® Patient Information ©2015 [employer]. This information is not intended to replace advice given to you by your health care provider. Make sure you discuss any questions you have with your health care provider. ° °

## 2014-09-23 NOTE — Progress Notes (Signed)
Pre visit review using our clinic review tool, if applicable. No additional management support is needed unless otherwise documented below in the visit note. 

## 2014-09-24 ENCOUNTER — Encounter (HOSPITAL_BASED_OUTPATIENT_CLINIC_OR_DEPARTMENT_OTHER): Payer: Self-pay | Admitting: *Deleted

## 2014-09-26 ENCOUNTER — Ambulatory Visit (HOSPITAL_BASED_OUTPATIENT_CLINIC_OR_DEPARTMENT_OTHER): Payer: Medicare Other | Admitting: Anesthesiology

## 2014-09-26 ENCOUNTER — Ambulatory Visit (HOSPITAL_BASED_OUTPATIENT_CLINIC_OR_DEPARTMENT_OTHER)
Admission: RE | Admit: 2014-09-26 | Discharge: 2014-09-26 | Disposition: A | Discharge status: Home / Self Care | Payer: Medicare Other | Source: Ambulatory Visit | Attending: Orthopedic Surgery | Admitting: Orthopedic Surgery

## 2014-09-26 ENCOUNTER — Encounter (HOSPITAL_BASED_OUTPATIENT_CLINIC_OR_DEPARTMENT_OTHER): Payer: Self-pay | Admitting: Orthopedic Surgery

## 2014-09-26 ENCOUNTER — Encounter (HOSPITAL_BASED_OUTPATIENT_CLINIC_OR_DEPARTMENT_OTHER)
Admission: RE | Disposition: A | Discharge status: Home / Self Care | Payer: Self-pay | Source: Ambulatory Visit | Attending: Orthopedic Surgery

## 2014-09-26 DIAGNOSIS — F329 Major depressive disorder, single episode, unspecified: Secondary | ICD-10-CM | POA: Diagnosis not present

## 2014-09-26 DIAGNOSIS — S56125A Laceration of flexor muscle, fascia and tendon of right ring finger at forearm level, initial encounter: Secondary | ICD-10-CM | POA: Diagnosis not present

## 2014-09-26 DIAGNOSIS — Z7982 Long term (current) use of aspirin: Secondary | ICD-10-CM | POA: Insufficient documentation

## 2014-09-26 DIAGNOSIS — Z85828 Personal history of other malignant neoplasm of skin: Secondary | ICD-10-CM | POA: Insufficient documentation

## 2014-09-26 DIAGNOSIS — Y939 Activity, unspecified: Secondary | ICD-10-CM | POA: Insufficient documentation

## 2014-09-26 DIAGNOSIS — X58XXXA Exposure to other specified factors, initial encounter: Secondary | ICD-10-CM | POA: Insufficient documentation

## 2014-09-26 DIAGNOSIS — K219 Gastro-esophageal reflux disease without esophagitis: Secondary | ICD-10-CM | POA: Insufficient documentation

## 2014-09-26 DIAGNOSIS — G629 Polyneuropathy, unspecified: Secondary | ICD-10-CM | POA: Insufficient documentation

## 2014-09-26 DIAGNOSIS — J449 Chronic obstructive pulmonary disease, unspecified: Secondary | ICD-10-CM | POA: Diagnosis not present

## 2014-09-26 DIAGNOSIS — L980 Pyogenic granuloma: Secondary | ICD-10-CM | POA: Diagnosis not present

## 2014-09-26 DIAGNOSIS — F988 Other specified behavioral and emotional disorders with onset usually occurring in childhood and adolescence: Secondary | ICD-10-CM | POA: Diagnosis not present

## 2014-09-26 DIAGNOSIS — Y999 Unspecified external cause status: Secondary | ICD-10-CM | POA: Insufficient documentation

## 2014-09-26 DIAGNOSIS — Z96642 Presence of left artificial hip joint: Secondary | ICD-10-CM | POA: Diagnosis not present

## 2014-09-26 DIAGNOSIS — Z79899 Other long term (current) drug therapy: Secondary | ICD-10-CM | POA: Diagnosis not present

## 2014-09-26 DIAGNOSIS — F419 Anxiety disorder, unspecified: Secondary | ICD-10-CM | POA: Insufficient documentation

## 2014-09-26 DIAGNOSIS — E039 Hypothyroidism, unspecified: Secondary | ICD-10-CM | POA: Diagnosis not present

## 2014-09-26 DIAGNOSIS — I10 Essential (primary) hypertension: Secondary | ICD-10-CM | POA: Diagnosis not present

## 2014-09-26 DIAGNOSIS — S66324A Laceration of extensor muscle, fascia and tendon of right ring finger at wrist and hand level, initial encounter: Secondary | ICD-10-CM | POA: Diagnosis not present

## 2014-09-26 DIAGNOSIS — Z87891 Personal history of nicotine dependence: Secondary | ICD-10-CM | POA: Insufficient documentation

## 2014-09-26 DIAGNOSIS — Y929 Unspecified place or not applicable: Secondary | ICD-10-CM | POA: Diagnosis not present

## 2014-09-26 DIAGNOSIS — Z981 Arthrodesis status: Secondary | ICD-10-CM | POA: Diagnosis not present

## 2014-09-26 DIAGNOSIS — S66324D Laceration of extensor muscle, fascia and tendon of right ring finger at wrist and hand level, subsequent encounter: Secondary | ICD-10-CM | POA: Diagnosis not present

## 2014-09-26 HISTORY — DX: Anxiety disorder, unspecified: F41.9

## 2014-09-26 HISTORY — PX: REPAIR EXTENSOR TENDON: SHX5382

## 2014-09-26 HISTORY — DX: Anemia, unspecified: D64.9

## 2014-09-26 HISTORY — DX: Depression, unspecified: F32.A

## 2014-09-26 HISTORY — DX: Major depressive disorder, single episode, unspecified: F32.9

## 2014-09-26 LAB — POCT I-STAT, CHEM 8
BUN: 43 mg/dL — ABNORMAL HIGH (ref 6–20)
CHLORIDE: 112 mmol/L — AB (ref 101–111)
Calcium, Ion: 1.04 mmol/L — ABNORMAL LOW (ref 1.13–1.30)
Creatinine, Ser: 1 mg/dL (ref 0.44–1.00)
Glucose, Bld: 99 mg/dL (ref 65–99)
HEMATOCRIT: 35 % — AB (ref 36.0–46.0)
HEMOGLOBIN: 11.9 g/dL — AB (ref 12.0–15.0)
Potassium: 4.3 mmol/L (ref 3.5–5.1)
Sodium: 139 mmol/L (ref 135–145)
TCO2: 19 mmol/L (ref 0–100)

## 2014-09-26 SURGERY — REPAIR, TENDON, EXTENSOR
Anesthesia: Regional | Site: Finger | Laterality: Right

## 2014-09-26 MED ORDER — GLYCOPYRROLATE 0.2 MG/ML IJ SOLN: 0.2000 mg | Freq: Once | INTRAMUSCULAR | Status: DC | PRN

## 2014-09-26 MED ORDER — SCOPOLAMINE 1 MG/3DAYS TD PT72
1.0000 | MEDICATED_PATCH | Freq: Once | TRANSDERMAL | Status: DC | PRN
Start: 2014-09-26 — End: 2014-09-26

## 2014-09-26 MED ORDER — ONDANSETRON HCL 4 MG/2ML IJ SOLN
INTRAMUSCULAR | Status: DC | PRN
  Administered 2014-09-26: 4 mg via INTRAVENOUS

## 2014-09-26 MED ORDER — EPHEDRINE SULFATE 50 MG/ML IJ SOLN
INTRAMUSCULAR | Status: DC | PRN
  Administered 2014-09-26 (×2): 10 mg via INTRAVENOUS

## 2014-09-26 MED ORDER — ONDANSETRON HCL 4 MG/2ML IJ SOLN
4.0000 mg | Freq: Once | INTRAMUSCULAR | Status: DC | PRN
Start: 2014-09-26 — End: 2014-09-26

## 2014-09-26 MED ORDER — PROPOFOL INFUSION 10 MG/ML OPTIME
INTRAVENOUS | Status: DC | PRN
  Administered 2014-09-26: 75 ug/kg/min via INTRAVENOUS

## 2014-09-26 MED ORDER — BUPIVACAINE HCL (PF) 0.25 % IJ SOLN
INTRAMUSCULAR | Status: DC | PRN
  Administered 2014-09-26: 4 mL

## 2014-09-26 MED ORDER — CHLORHEXIDINE GLUCONATE 4 % EX LIQD: 60.0000 mL | Freq: Once | CUTANEOUS | Status: DC

## 2014-09-26 MED ORDER — MIDAZOLAM HCL 2 MG/2ML IJ SOLN
INTRAMUSCULAR | Status: AC
  Filled 2014-09-26: qty 2

## 2014-09-26 MED ORDER — VANCOMYCIN HCL IN DEXTROSE 1-5 GM/200ML-% IV SOLN: 1000.0000 mg | INTRAVENOUS | Status: DC

## 2014-09-26 MED ORDER — OXYCODONE-ACETAMINOPHEN 5-325 MG PO TABS: 1.0000 | ORAL_TABLET | ORAL | Status: DC | PRN

## 2014-09-26 MED ORDER — FENTANYL CITRATE (PF) 100 MCG/2ML IJ SOLN
50.0000 ug | INTRAMUSCULAR | Status: DC | PRN
  Administered 2014-09-26 (×2): 50 ug via INTRAVENOUS

## 2014-09-26 MED ORDER — MIDAZOLAM HCL 2 MG/2ML IJ SOLN: 1.0000 mg | INTRAMUSCULAR | Status: DC | PRN

## 2014-09-26 MED ORDER — FENTANYL CITRATE (PF) 100 MCG/2ML IJ SOLN: 25.0000 ug | INTRAMUSCULAR | Status: DC | PRN

## 2014-09-26 MED ORDER — LACTATED RINGERS IV SOLN
INTRAVENOUS | Status: DC
  Administered 2014-09-26: 12:00:00 via INTRAVENOUS

## 2014-09-26 MED ORDER — VANCOMYCIN HCL IN DEXTROSE 1-5 GM/200ML-% IV SOLN
1000.0000 mg | INTRAVENOUS | Status: AC
  Administered 2014-09-26: 1000 mg via INTRAVENOUS

## 2014-09-26 MED ORDER — VANCOMYCIN HCL IN DEXTROSE 1-5 GM/200ML-% IV SOLN
INTRAVENOUS | Status: AC
  Filled 2014-09-26: qty 200

## 2014-09-26 MED ORDER — FENTANYL CITRATE (PF) 100 MCG/2ML IJ SOLN
INTRAMUSCULAR | Status: AC
  Filled 2014-09-26: qty 4

## 2014-09-26 SURGICAL SUPPLY — 82 items
BAG DECANTER FOR FLEXI CONT (MISCELLANEOUS) IMPLANT
BALL CTTN LRG ABS STRL LF (GAUZE/BANDAGES/DRESSINGS)
BLADE MINI RND TIP GREEN BEAV (BLADE) IMPLANT
BLADE SURG 15 STRL LF DISP TIS (BLADE) ×1 IMPLANT
BLADE SURG 15 STRL SS (BLADE) ×2
BNDG CMPR 9X4 STRL LF SNTH (GAUZE/BANDAGES/DRESSINGS) ×1
BNDG COHESIVE 3X5 TAN STRL LF (GAUZE/BANDAGES/DRESSINGS) ×2 IMPLANT
BNDG ESMARK 4X9 LF (GAUZE/BANDAGES/DRESSINGS) ×2 IMPLANT
BNDG GAUZE ELAST 4 BULKY (GAUZE/BANDAGES/DRESSINGS) ×2 IMPLANT
CHLORAPREP W/TINT 26ML (MISCELLANEOUS) ×2 IMPLANT
CORDS BIPOLAR (ELECTRODE) ×2 IMPLANT
COTTONBALL LRG STERILE PKG (GAUZE/BANDAGES/DRESSINGS) IMPLANT
COVER BACK TABLE 60X90IN (DRAPES) ×2 IMPLANT
COVER MAYO STAND STRL (DRAPES) ×2 IMPLANT
CUFF TOURNIQUET SINGLE 18IN (TOURNIQUET CUFF) ×2 IMPLANT
DECANTER SPIKE VIAL GLASS SM (MISCELLANEOUS) IMPLANT
DRAIN TLS ROUND 10FR (DRAIN) IMPLANT
DRAPE EXTREMITY T 121X128X90 (DRAPE) ×2 IMPLANT
DRAPE OEC MINIVIEW 54X84 (DRAPES) IMPLANT
DRAPE SURG 17X23 STRL (DRAPES) ×2 IMPLANT
DRSG KUZMA FLUFF (GAUZE/BANDAGES/DRESSINGS) IMPLANT
GAUZE SPONGE 4X4 12PLY STRL (GAUZE/BANDAGES/DRESSINGS) ×2 IMPLANT
GAUZE SPONGE 4X4 16PLY XRAY LF (GAUZE/BANDAGES/DRESSINGS) IMPLANT
GAUZE XEROFORM 1X8 LF (GAUZE/BANDAGES/DRESSINGS) ×2 IMPLANT
GLOVE BIO SURGEON STRL SZ 6.5 (GLOVE) ×2 IMPLANT
GLOVE BIOGEL PI IND STRL 7.0 (GLOVE) IMPLANT
GLOVE BIOGEL PI IND STRL 7.5 (GLOVE) IMPLANT
GLOVE BIOGEL PI IND STRL 8.5 (GLOVE) ×1 IMPLANT
GLOVE BIOGEL PI INDICATOR 7.0 (GLOVE) ×2
GLOVE BIOGEL PI INDICATOR 7.5 (GLOVE) ×1
GLOVE BIOGEL PI INDICATOR 8.5 (GLOVE) ×1
GLOVE SURG ORTHO 8.0 STRL STRW (GLOVE) ×2 IMPLANT
GOWN STRL REUS W/ TWL LRG LVL3 (GOWN DISPOSABLE) ×2 IMPLANT
GOWN STRL REUS W/TWL LRG LVL3 (GOWN DISPOSABLE) ×4
GOWN STRL REUS W/TWL XL LVL3 (GOWN DISPOSABLE) ×2 IMPLANT
K-WIRE .035X4 (WIRE) IMPLANT
LOOP VESSEL MAXI BLUE (MISCELLANEOUS) IMPLANT
NDL KEITH (NEEDLE) IMPLANT
NDL PRECISIONGLIDE 27X1.5 (NEEDLE) IMPLANT
NEEDLE HYPO 22GX1.5 SAFETY (NEEDLE) IMPLANT
NEEDLE KEITH (NEEDLE) IMPLANT
NEEDLE PRECISIONGLIDE 27X1.5 (NEEDLE) ×2 IMPLANT
NS IRRIG 1000ML POUR BTL (IV SOLUTION) ×2 IMPLANT
PACK BASIN DAY SURGERY FS (CUSTOM PROCEDURE TRAY) ×2 IMPLANT
PAD CAST 3X4 CTTN HI CHSV (CAST SUPPLIES) ×1 IMPLANT
PADDING CAST ABS 3INX4YD NS (CAST SUPPLIES)
PADDING CAST ABS 4INX4YD NS (CAST SUPPLIES) ×1
PADDING CAST ABS COTTON 3X4 (CAST SUPPLIES) IMPLANT
PADDING CAST ABS COTTON 4X4 ST (CAST SUPPLIES) ×1 IMPLANT
PADDING CAST COTTON 3X4 STRL (CAST SUPPLIES) ×2
SLEEVE SCD COMPRESS KNEE MED (MISCELLANEOUS) ×2 IMPLANT
SPLINT PLASTER CAST XFAST 3X15 (CAST SUPPLIES) ×5 IMPLANT
SPLINT PLASTER XTRA FASTSET 3X (CAST SUPPLIES) ×5
STOCKINETTE 4X48 STRL (DRAPES) ×2 IMPLANT
SUT CHROMIC 5 0 P 3 (SUTURE) IMPLANT
SUT ETHIBOND 3-0 V-5 (SUTURE) IMPLANT
SUT ETHILON 4 0 PS 2 18 (SUTURE) ×1 IMPLANT
SUT ETHILON 5 0 PC 1 (SUTURE) ×2 IMPLANT
SUT FIBERWIRE 2-0 18 17.9 3/8 (SUTURE)
SUT FIBERWIRE 4-0 18 TAPR NDL (SUTURE)
SUT MERSILENE 2.0 SH NDLE (SUTURE) IMPLANT
SUT MERSILENE 3 0 FS 1 (SUTURE) IMPLANT
SUT MERSILENE 4 0 P 3 (SUTURE) ×1 IMPLANT
SUT POLY BUTTON 15MM (SUTURE) IMPLANT
SUT PROLENE 2 0 SH DA (SUTURE) IMPLANT
SUT SILK 2 0 FS (SUTURE) IMPLANT
SUT SILK 4 0 PS 2 (SUTURE) IMPLANT
SUT STEEL 3 0 (SUTURE) IMPLANT
SUT STEEL 4 0 V 26 (SUTURE) IMPLANT
SUT VIC AB 3-0 PS1 18 (SUTURE)
SUT VIC AB 3-0 PS1 18XBRD (SUTURE) IMPLANT
SUT VIC AB 4-0 P-3 18XBRD (SUTURE) IMPLANT
SUT VIC AB 4-0 P3 18 (SUTURE)
SUT VICRYL 4-0 PS2 18IN ABS (SUTURE) IMPLANT
SUTURE FIBERWR 2-0 18 17.9 3/8 (SUTURE) IMPLANT
SUTURE FIBERWR 4-0 18 TAPR NDL (SUTURE) IMPLANT
SYR BULB 3OZ (MISCELLANEOUS) ×2 IMPLANT
SYR CONTROL 10ML LL (SYRINGE) ×2 IMPLANT
TOWEL OR 17X24 6PK STRL BLUE (TOWEL DISPOSABLE) ×2 IMPLANT
TOWEL OR NON WOVEN STRL DISP B (DISPOSABLE) ×2 IMPLANT
TUBE FEEDING 5FR 15 INCH (TUBING) IMPLANT
UNDERPAD 30X30 (UNDERPADS AND DIAPERS) ×1 IMPLANT

## 2014-09-26 NOTE — H&P (View-Only) (Signed)
Rose [name] is a 74 year old female who incurred a laceration to her right  ring  finger, This was closed at urgent care one week agobut has become inflamed , swollen and painful.Marland Kitchen   PAST MEDICAL HISTORY: She is allergic to Morphine. She is on the following medications: Diovan, Amlodipine, Synthroid, Oxybutynin, Nexium, Buperpion, and Cymbalta. She has had the past surgeries: Left hip replacement, cervical spine, spine fusion.  FAMILY H ISTORY: Unknown.  SOCIAL HISTORY: She does not smoke or drink. She is retired. Rose [name] is an 74 y.o. female.   Chief Complaint: swollen right ring finger HPI: see abpve  Past Medical History  Diagnosis Date  . Other tenosynovitis of hand and wrist   . Peripheral neuropathy   . Personal history of alcoholism   . DJD (degenerative joint disease) of hip   . Myalgia and myositis, unspecified   . History of depression   . Morton's neuroma     Hx of, Left  . Carotid bruit     Left  . History of spinal fusion   . Personal history of peptic ulcer disease   . Solitary cyst of breast   . Basal cell carcinoma of face   . Attention deficit disorder without mention of hyperactivity   . Unspecified hypothyroidism   . Benign heart murmur   . Esophageal reflux   . Hypertension   . Pulmonary nodule   . Chronic bronchitis   . Wears glasses   . Abnormal blood pressure     left arm from old brachial artery repair  . Subclavian steal syndrome   . Carotid artery disease   . Atherosclerosis of renal artery   . Centrilobular emphysema     Past Surgical History  Procedure Laterality Date  . Appendectomy    . Tonsillectomy and adenoidectomy    . Left brachial artery repair of pseudoaneurysm  2001    post a cath  . Percutanous transluminal angioplasty of renal arteris    . Lumbar fusion  09/2004    T12-L5 (Dr. Patrice [name])  . Cervical fusion  2007    Dr. Valli [name] approach  . Total hip arthroplasty  2008    left  . Shoulder arthroscopy  09/2008   Left  . Orif tibia fracture  2010    Left distal  . Wisdom tooth extraction    . Carpometacarpel suspension plasty Right 08/07/2013    Procedure: CARPOMETACARPEL [facility]) SUSPENSION PLASTY RIGHT THUMB;  Surgeon: Rose Sours, MD;  Location: [facility];  Service: Orthopedics;  Laterality: Right;    Family History  Problem Relation Age of Onset  . Adopted: Yes  . Schizophrenia Son     Deceased 60  . Hypertension Daughter     Renal  . Healthy Son     x1  . Healthy Daughter     x2   Social History:  reports that she quit smoking about 31 years ago. Her smoking use included Cigarettes. She has a 25 pack-year smoking history. She has never used smokeless tobacco. She reports that she drinks alcohol. She reports that she does not use illicit drugs.  Allergies:  Allergies  Allergen Reactions  . Amoxicillin     REACTION: unspecified  . Codeine   . Hydrocodone-Acetaminophen     REACTION: itching  . Hydromorphone Hcl   . Morphine And Related Hives and Itching  . Morphine Sulfate     REACTION: unspecified  . Nsaids     Pt can't remember  what reaction she had    Medications Prior to Admission  Medication Sig Dispense Refill  . amLODipine (NORVASC) 5 MG tablet Take 1 tablet (5 mg total) by mouth daily. 90 tablet 0  . aspirin 81 MG tablet Take 81 mg by mouth daily.     Marland Kitchen buPROPion (WELLBUTRIN XL) 300 MG 24 hr tablet TAKE ONE (1) TABLET EACH DAY 90 tablet 2  . cloNIDine (CATAPRES) 0.1 MG tablet TAKE ONE TABLET TWICE DAILY 60 tablet 1  . diazepam (VALIUM) 5 MG tablet Take 1 tablet (5 mg total) by mouth every 12 (twelve) hours as needed for anxiety or muscle spasms (use sparingly). 30 tablet 2  . DULoxetine (CYMBALTA) 60 MG capsule TAKE ONE (1) CAPSULE EACH DAY 90 capsule 2  . ferrous sulfate dried (SLOW FE) 160 (50 FE) MG TBCR Take 160 mg by mouth 2 (two) times daily.     . Glucosamine 500 MG TABS Take 1 tablet by mouth 2 (two) times daily. Glucosamine-Chondronton    .  L-Methylfolate 15 MG TABS TAKE ONE (1) TABLET EACH DAY 30 tablet 3  . levothyroxine (SYNTHROID, LEVOTHROID) 112 MCG tablet TAKE ONE (1) TABLET BY MOUTH EVERY DAY BEFORE BREAKFAST 90 tablet 0  . STRATTERA 100 MG capsule TAKE ONE (1) CAPSULE EACH DAY 30 capsule 3  . valsartan (DIOVAN) 160 MG tablet Take 1.5 tablets by mouth daily. 135 tablet 1  . VESICARE 10 MG tablet TAKE ONE (1) TABLET EACH DAY 90 tablet 0  . ciprofloxacin (CIPRO) 250 MG tablet Take 1 tablet (250 mg total) by mouth 2 (two) times daily. 6 tablet 0  . diclofenac (FLECTOR) 1.3 % PTCH Place 1 patch onto the skin 2 (two) times daily. Dispense one box. 5 patch 0  . oxyCODONE-acetaminophen (ROXICET) 5-325 MG per tablet Take 1 tablet by mouth every 4 (four) hours as needed (for pain; may cause constipation). 20 tablet 0  . pregabalin (LYRICA) 100 MG capsule Take 1 capsule (100 mg total) by mouth 3 (three) times daily. 90 capsule 1    No results found for this or any previous visit (from the past 48 hour(s)).  No results found.   Pertinent items are noted in HPI.  Blood pressure 113/72, pulse 74, temperature 98 F (36.7 C), temperature source Oral, resp. rate 20, SpO2 96 %.  General appearance: alert, cooperative and appears stated age Head: Normocephalic, without obvious abnormality Neck: no JVD Resp: clear to auscultation bilaterally Cardio: regular rate and rhythm, S1, S2 normal, no murmur, click, rub or gallop GI: soft, non-tender; bowel sounds normal; no masses,  no organomegaly Extremities: laceration with selling left ring finger Pulses: 2+ and symmetric Skin: Skin color, texture, turgor normal. No rashes or lesions Neurologic: Grossly normal Incision/Wound: Infected laceration left ring finger  Assessment/Plan Infected laceration right ring finger Plan I@D  left ring finger  Rose [name] R 08/27/2014, 12:00 PM

## 2014-09-26 NOTE — Op Note (Signed)
Dictation Number [id number]

## 2014-09-26 NOTE — Discharge Instructions (Addendum)

## 2014-09-26 NOTE — Anesthesia Postprocedure Evaluation (Signed)
Anesthesia Post Note  Patient: Rose [name]  Procedure(s) Performed: Procedure(s) (LRB): REPAIR EXTENSOR TENDON RIGHT RING FINGER  (Right)  Anesthesia type: general  Patient location: PACU  Post pain: Pain level controlled  Post assessment: Patient's Cardiovascular Status Stable  Last Vitals:  Filed Vitals:   09/26/14 1314  BP:   Pulse: 72  Temp:   Resp: 16    Post vital signs: Reviewed and stable  Level of consciousness: sedated  Complications: No apparent anesthesia complications

## 2014-09-26 NOTE — Transfer of Care (Signed)
Immediate Anesthesia Transfer of Care Note  Patient: Rose [name]  Procedure(s) Performed: Procedure(s): REPAIR EXTENSOR TENDON RIGHT RING FINGER  (Right)  Patient Location: PACU  Anesthesia Type:Bier block  Level of Consciousness: awake, oriented and patient cooperative  Airway & Oxygen Therapy: Patient Spontanous Breathing and Patient connected to face mask oxygen  Post-op Assessment: Report given to RN and Post -op Vital signs reviewed and stable  Post vital signs: Reviewed and stable  Last Vitals:  Filed Vitals:   09/26/14 1157  BP: 109/60  Pulse: 68  Temp: 36.6 C  Resp: 20    Complications: No apparent anesthesia complications

## 2014-09-26 NOTE — Anesthesia Preprocedure Evaluation (Signed)
Anesthesia Evaluation  Patient identified by MRN, date of birth, ID band Patient awake    Reviewed: Allergy & Precautions, NPO status , Patient's Chart, lab work & pertinent test results  Airway Mallampati: I  TM Distance: >3 FB Neck ROM: Full    Dental   Pulmonary COPDformer smoker,    Pulmonary exam normal       Cardiovascular hypertension, Pt. on medications Normal cardiovascular exam    Neuro/Psych Anxiety Depression    GI/Hepatic GERD-  Medicated and Controlled,  Endo/Other    Renal/GU      Musculoskeletal   Abdominal   Peds  Hematology   Anesthesia Other Findings   Reproductive/Obstetrics                             Anesthesia Physical Anesthesia Plan  ASA: II  Anesthesia Plan: Bier Block   Post-op Pain Management:    Induction: Intravenous  Airway Management Planned: Simple Face Mask  Additional Equipment:   Intra-op Plan:   Post-operative Plan:   Informed Consent: I have reviewed the patients History and Physical, chart, labs and discussed the procedure including the risks, benefits and alternatives for the proposed anesthesia with the patient or authorized representative who has indicated his/her understanding and acceptance.     Plan Discussed with: CRNA and Surgeon  Anesthesia Plan Comments:         Anesthesia Quick Evaluation

## 2014-09-26 NOTE — Brief Op Note (Signed)
09/26/2014  12:48 PM  PATIENT:  Rose [name]  74 y.o. female  PRE-OPERATIVE DIAGNOSIS:  laceration extensor tendon right ring finger   POST-OPERATIVE DIAGNOSIS:  laceration extensor tendon right ring finger   PROCEDURE:  Procedure(s): REPAIR EXTENSOR TENDON RIGHT RING FINGER  (Right)  SURGEON:  Surgeon(s) and Role:    * Daryll Brod, MD - Primary  PHYSICIAN ASSISTANT:   ASSISTANTS: none   ANESTHESIA:   local and regional  EBL:     BLOOD ADMINISTERED:none  DRAINS: none   LOCAL MEDICATIONS USED:  BUPIVICAINE   SPECIMEN:  No Specimen  DISPOSITION OF SPECIMEN:  N/A  COUNTS:  YES  TOURNIQUET:   Total Tourniquet Time Documented: Forearm (Right) - 19 minutes Total: Forearm (Right) - 19 minutes   DICTATION: .Other Dictation: Dictation Number [id number]  PLAN OF CARE: Discharge to home after PACU  PATIENT DISPOSITION:  PACU - hemodynamically stable.

## 2014-09-26 NOTE — Interval H&P Note (Signed)
History and Physical Interval Note:  09/26/2014 9:57 AM  Rose [name]  has presented today for surgery, with the diagnosis of laceration extensor tendon right ring finger   The various methods of treatment have been discussed with the patient and family. After consideration of risks, benefits and other options for treatment, the patient has consented to  Procedure(s): REPAIR EXTENSOR TENDON RIGHT RING FINGER  (Right) as a surgical intervention .  The patient's history has been reviewed, patient examined, no change in status, stable for surgery.  I have reviewed the patient's chart and labs.  Questions were answered to the patient's satisfaction.     Soliana Kitko R

## 2014-09-27 ENCOUNTER — Encounter (HOSPITAL_BASED_OUTPATIENT_CLINIC_OR_DEPARTMENT_OTHER): Payer: Self-pay | Admitting: Orthopedic Surgery

## 2014-09-27 NOTE — Op Note (Signed)
Rose [name], Rose [name]              ACCOUNT NO.:  [id number]  MEDICAL RECORD NO.:  [id number]  LOCATION:                               FACILITY:  [facility]  PHYSICIAN:  Daryll Brod, M.D.       DATE OF BIRTH:  03/09/1941  DATE OF PROCEDURE:  09/26/2014 DATE OF DISCHARGE:  09/26/2014                              OPERATIVE REPORT   PREOPERATIVE DIAGNOSIS:  Laceration extensor tendon, right ring finger.  POSTOPERATIVE DIAGNOSIS:  Laceration extensor tendon, right ring finger.  OPERATION:  Repair extensor tendon, right ring finger.  SURGEON:  Daryll Brod, MD.  ANESTHESIA:  Forearm IV regional.  ANESTHESIOLOGIST:  Crissie Sickles. Conrad Puhi, M.D.  HISTORY:  The patient is a 74 year old female who suffered a laceration over the dorsal aspect proximal phalanx, right ring finger.  She underwent debridement of an infection 2 weeks ago.  She is admitted now for repair to the tendon laceration.  Pre, peri, and postoperative course have been discussed along with risks and complications.  She is aware there is no guarantee with the surgery, possibility of infection, recurrence of injury to arteries, nerves, tendons, incomplete relief of symptoms, and dystrophy.  In the preoperative area, the patient is seen, the extremity marked by both patient and surgeon, antibiotic given.  PROCEDURE IN DETAIL:  The patient was brought to the operating room, where forearm-based IV regional anesthetic was carried out without difficulty.  She was prepped using ChloraPrep, supine position with the right arm free.  A 3-minute dry time was allowed.  Time-out taken, confirming the patient and procedure.  An incision was made removing a small pyogenic granuloma which had occurred in the central aspect of the wound.  This was excised in toto.  No purulent material was encountered. The dissection deepened to the extensor tendon.  The scar was removed with sharp dissection and repair performed after irrigation with figure- of-eight  4-0 [place] sutures.  The wound was again irrigated.  Skin closed with interrupted 4-0 nylon sutures.  Sterile compressive dressing, volar splint was applied with the wrist dorsiflexed and fingers in slightly extended position.  The patient tolerated the procedure well.  On deflation of the tourniquet, all fingers immediately pinked.  She was taken to the recovery room for observation in satisfactory condition.  She will be discharged home to return to the [facility] in 1 week on Norco.    ______________________________ Daryll Brod, M.D.   ______________________________ Daryll Brod, M.D.    GK/MEDQ  D:  09/26/2014  T:  09/27/2014  Job:  [id number]

## 2014-10-13 ENCOUNTER — Other Ambulatory Visit: Payer: Self-pay | Admitting: Physician Assistant

## 2014-10-21 ENCOUNTER — Encounter: Payer: Self-pay | Admitting: Physician Assistant

## 2014-10-21 ENCOUNTER — Ambulatory Visit (INDEPENDENT_AMBULATORY_CARE_PROVIDER_SITE_OTHER): Payer: Medicare Other | Admitting: Physician Assistant

## 2014-10-21 ENCOUNTER — Telehealth: Payer: Self-pay | Admitting: Physician Assistant

## 2014-10-21 VITALS — BP 118/50 | HR 74 | Temp 97.9°F | Ht 63.0 in | Wt 114.0 lb

## 2014-10-21 DIAGNOSIS — R2681 Unsteadiness on feet: Secondary | ICD-10-CM | POA: Diagnosis not present

## 2014-10-21 DIAGNOSIS — K921 Melena: Secondary | ICD-10-CM

## 2014-10-21 DIAGNOSIS — R159 Full incontinence of feces: Secondary | ICD-10-CM | POA: Diagnosis not present

## 2014-10-21 NOTE — Assessment & Plan Note (Signed)
Hemoccult negative. Black stools potentially due to iron supplementation as this should cause darker stools if being utilized correctly. Giving fecal urgency and incontinence, will refer to GI for further assessment. Patient to continue supplement as directed. Will check CBC today. No NSAIDs. Stop alcohol consumption. Alarm signs/symptoms discussed with patient.

## 2014-10-21 NOTE — Patient Instructions (Addendum)
Your quick Hemoccult test is negative, but further assessment is needed. Color of stools can very much reflect your iron supplementation, especially in the absence of other symptoms. However you need GI assessment for fecal incontinence and as you are overdue for colonoscopy.  Please stop by the lab for blood work. I will call you with your results. Stop alcohol consumption and use of any anti-inflammatories.  Continue other medications as directed. You will be contacted by Gastroenterology (GI) for assessment. If anything acutely worsens of you develop lightheadedness or shortness of breath, please call 911.  You will also be contacted by Physical Therapy for strengthening and evaluation of gait/balance. Follow-up with Dr. Tamala Julian as scheduled.

## 2014-10-21 NOTE — Telephone Encounter (Addendum)
Called and spoke with Jarrett Soho and informed her of the note below.  She stated that she received the information and thanks.  Confirmation received.//AB/CMA

## 2014-10-21 NOTE — Progress Notes (Signed)
Pre visit review using our clinic review tool, if applicable. No additional management support is needed unless otherwise documented below in the visit note. 

## 2014-10-21 NOTE — Telephone Encounter (Addendum)
Called and spoke with Jarrett Soho with [facility] and she wanted to inquire if the pt needs to pre-medicated before an appt.  If the pt does not they would like a letter stating that the pt no longer needs to be pre-medicated before an appt.//AB/CMA

## 2014-10-21 NOTE — Telephone Encounter (Signed)
Caller name: Earnest Bailey  Relation to pt: Chief of Staff Dr. Dannielle Burn, DDS: [facility] Call back number: [phone] and fax # [phone]    Reason for call:  Inquiring about pre meds  patient has an appointment at 12noon today. Would like to speak with someone as soon as possible regarding script.

## 2014-10-21 NOTE — Progress Notes (Signed)
Patient presents to clinic today c/o black stool noted intermittently over the past [redacted] weeks along with alternating loose and firm stools. Denies change to diet. Endorses one glass of wine per day. Denies NSAID use. Denies nausea, vomiting, reflux, abdominal pain, tenesmus or hematochezia. Has history of iron deficiency for which she is currently on Slow Fe 2 tablets per day.  Is overdue for colonoscopy. Denies family hx of colorectal cancer. Denies SOB or fatigue. Does endorse long-standing history of fecal urgency and incontinence. Is not followed by GI or colorectal surgery.  Past Medical History  Diagnosis Date  . Other tenosynovitis of hand and wrist   . Peripheral neuropathy   . Personal history of alcoholism   . DJD (degenerative joint disease) of hip   . Myalgia and myositis, unspecified   . History of depression   . Morton's neuroma     Hx of, Left  . Carotid bruit     Left  . History of spinal fusion   . Personal history of peptic ulcer disease   . Solitary cyst of breast   . Basal cell carcinoma of face   . Attention deficit disorder without mention of hyperactivity   . Unspecified hypothyroidism   . Benign heart murmur   . Esophageal reflux   . Hypertension   . Pulmonary nodule   . Chronic bronchitis   . Wears glasses   . Abnormal blood pressure     left arm from old brachial artery repair  . Subclavian steal syndrome   . Carotid artery disease   . Atherosclerosis of renal artery   . Centrilobular emphysema   . Depression   . Anxiety   . Anemia     Current Outpatient Prescriptions on File Prior to Visit  Medication Sig Dispense Refill  . amLODipine (NORVASC) 10 MG tablet TAKE 1 TABLET DAILY 90 tablet 1  . aspirin 81 MG tablet Take 81 mg by mouth daily.     . cloNIDine (CATAPRES) 0.1 MG tablet TAKE 1 TABLET BY MOUTH TWICE DAILY 60 tablet 3  . diazepam (VALIUM) 5 MG tablet Take 1 tablet (5 mg total) by mouth every 12 (twelve) hours as needed for anxiety or  muscle spasms (use sparingly). 30 tablet 2  . DULoxetine (CYMBALTA) 60 MG capsule TAKE ONE (1) CAPSULE EACH DAY 90 capsule 2  . ferrous sulfate dried (SLOW FE) 160 (50 FE) MG TBCR Take 160 mg by mouth 2 (two) times daily.     Marland Kitchen gabapentin (NEURONTIN) 600 MG tablet TAKE 1 TABLET (600 MG TOTAL) BY MOUTH 3 (THREE) TIMES DAILY. 90 tablet 3  . Glucosamine 500 MG TABS Take 1 tablet by mouth 2 (two) times daily. Glucosamine-Chondronton    . L-Methylfolate 15 MG TABS TAKE ONE (1) TABLET EACH DAY 30 tablet 3  . levothyroxine (SYNTHROID, LEVOTHROID) 112 MCG tablet TAKE 1 TABLET (112 MCG TOTAL) BY MOUTH DAILY BEFORE BREAKFAST. 30 tablet 3  . sertraline (ZOLOFT) 100 MG tablet Take 1 tablet (100 mg total) by mouth daily. 30 tablet 3  . STRATTERA 100 MG capsule TAKE ONE (1) CAPSULE EACH DAY 30 capsule 3  . valsartan (DIOVAN) 160 MG tablet Take 1.5 tablets by mouth daily. 135 tablet 1  . VESICARE 10 MG tablet TAKE 1 TABLET BY MOUTH DAILY 30 tablet 5   No current facility-administered medications on file prior to visit.    Allergies  Allergen Reactions  . Amoxicillin     REACTION: unspecified  . Codeine   .  Hydrocodone-Acetaminophen     REACTION: itching  . Hydromorphone Hcl   . Morphine And Related Hives and Itching  . Morphine Sulfate     REACTION: unspecified  . Nsaids     Pt can't remember what reaction she had    Family History  Problem Relation Age of Onset  . Adopted: Yes  . Schizophrenia Son     Deceased 4  . Hypertension Daughter     Renal  . Healthy Son     x1  . Healthy Daughter     x2    History   Social History  . Marital Status: Single    Spouse Name: N/A  . Number of Children: Y  . Years of Education: N/A   Occupational History  . retired     Engineer, maintenance (IT), had own firm   Social History Main Topics  . Smoking status: Former Smoker -- 1.00 packs/day for 25 years    Types: Cigarettes    Quit date: 03/16/1983  . Smokeless tobacco: Never Used  . Alcohol Use: Yes      Comment: occ-hx alcohol abuse  . Drug Use: No  . Sexual Activity: Not on file   Other Topics Concern  . None   Social History Narrative   [employer]-accounting      Married '60-60yr divorced; '84-2 years, divorced      3 daughters- '61, '70, '71; 2 sons-'53,'55 (schizophrenic-died); 10 grandchildren      Lives alone with 1 dog and 3 cats      Pt unsure of family history- was adopted.                  Review of Systems - See HPI.  All other ROS are negative.  BP 118/50 mmHg  Pulse 74  Temp(Src) 97.9 F (36.6 C) (Oral)  Ht 5\' 3"  (1.6 m)  Wt 114 lb (51.71 kg)  BMI 20.20 kg/m2  SpO2 94%  Physical Exam  Constitutional: She is well-developed, well-nourished, and in no distress.  HENT:  Head: Normocephalic and atraumatic.  Eyes: Conjunctivae are normal.  Cardiovascular: Normal rate, regular rhythm, normal heart sounds and intact distal pulses.   Pulmonary/Chest: Effort normal and breath sounds normal. No respiratory distress. She has no wheezes. She has no rales. She exhibits no tenderness.  Abdominal: Soft. Bowel sounds are normal. She exhibits mass. She exhibits no distension. There is no tenderness. There is no rebound and no guarding.  Genitourinary: Guaiac negative stool.  Skin: Skin is warm and dry. No rash noted.  Psychiatric: Affect normal.  Vitals reviewed.   Recent Results (from the past 2160 hour(s))  Anaerobic culture     Status: None   Collection Time: 08/27/14  2:48 PM  Result Value Ref Range   Specimen Description WOUND RIGHT FINGER    Special Requests RIGHT RING FINGER SPECIMEN A    Gram Stain      RARE WBC PRESENT, PREDOMINANTLY PMN NO SQUAMOUS EPITHELIAL CELLS SEEN NO ORGANISMS SEEN Performed at [facility]    Culture      NO ANAEROBES ISOLATED Performed at [facility]    Report Status 09/01/2014 FINAL   Fungus Culture with Smear     Status: None   Collection Time: 08/27/14  2:50 PM  Result Value Ref Range    Specimen Description WOUND RIGHT FINGER    Special Requests RIGHT RING FINGER SPECIMEN A     Fungal Smear      NO YEAST OR FUNGAL  ELEMENTS SEEN Performed at [facility]    Culture      No Fungi Isolated in 4 Weeks Performed at [facility]    Report Status 09/23/2014 FINAL   Wound culture     Status: None   Collection Time: 08/27/14  2:51 PM  Result Value Ref Range   Specimen Description WOUND RIGHT FINGER    Special Requests RIGHT RING FINGER SPECIMEN A     Gram Stain      RARE WBC PRESENT, PREDOMINANTLY PMN NO SQUAMOUS EPITHELIAL CELLS SEEN NO ORGANISMS SEEN Performed at [facility]    Culture      NO GROWTH 2 DAYS Performed at [facility]    Report Status 08/30/2014 FINAL   I-STAT, chem 8     Status: Abnormal   Collection Time: 09/26/14 12:14 PM  Result Value Ref Range   Sodium 139 135 - 145 mmol/L   Potassium 4.3 3.5 - 5.1 mmol/L   Chloride 112 (H) 101 - 111 mmol/L   BUN 43 (H) 6 - 20 mg/dL   Creatinine, Ser 1.00 0.44 - 1.00 mg/dL   Glucose, Bld 99 65 - 99 mg/dL   Calcium, Ion 1.04 (L) 1.13 - 1.30 mmol/L   TCO2 19 0 - 100 mmol/L   Hemoglobin 11.9 (L) 12.0 - 15.0 g/dL   HCT 35.0 (L) 36.0 - 46.0 %    Assessment/Plan: Fecal incontinence Hemoccult negative. Black stools potentially due to iron supplementation as this should cause darker stools if being utilized correctly. Giving fecal urgency and incontinence, will refer to GI for further assessment. Patient to continue supplement as directed. Will check CBC today. No NSAIDs. Stop alcohol consumption. Alarm signs/symptoms discussed with patient.

## 2014-10-21 NOTE — Telephone Encounter (Signed)
Please fax over letter and guidelines attached. She does not need antibiotics. Please call Earnest Bailey and inform her of this and that we are faxing over letter.

## 2014-10-22 LAB — CBC
HEMATOCRIT: 33.6 % — AB (ref 36.0–46.0)
Hemoglobin: 11.2 g/dL — ABNORMAL LOW (ref 12.0–15.0)
MCHC: 33.4 g/dL (ref 30.0–36.0)
MCV: 99 fl (ref 78.0–100.0)
PLATELETS: 207 10*3/uL (ref 150.0–400.0)
RBC: 3.39 Mil/uL — AB (ref 3.87–5.11)
RDW: 13 % (ref 11.5–15.5)
WBC: 6.1 10*3/uL (ref 4.0–10.5)

## 2014-10-23 ENCOUNTER — Encounter: Payer: Self-pay | Admitting: *Deleted

## 2014-10-24 ENCOUNTER — Other Ambulatory Visit: Payer: Self-pay | Admitting: Physician Assistant

## 2014-10-27 ENCOUNTER — Encounter: Payer: Self-pay | Admitting: Physician Assistant

## 2014-10-29 ENCOUNTER — Encounter: Payer: Self-pay | Admitting: Family Medicine

## 2014-10-29 ENCOUNTER — Ambulatory Visit (INDEPENDENT_AMBULATORY_CARE_PROVIDER_SITE_OTHER): Payer: Medicare Other | Admitting: Family Medicine

## 2014-10-29 VITALS — BP 120/62 | HR 88 | Ht 63.0 in | Wt 114.0 lb

## 2014-10-29 DIAGNOSIS — T8484XD Pain due to internal orthopedic prosthetic devices, implants and grafts, subsequent encounter: Secondary | ICD-10-CM

## 2014-10-29 DIAGNOSIS — M2170 Unequal limb length (acquired), unspecified site: Secondary | ICD-10-CM

## 2014-10-29 DIAGNOSIS — M129 Arthropathy, unspecified: Secondary | ICD-10-CM | POA: Diagnosis not present

## 2014-10-29 DIAGNOSIS — Z96649 Presence of unspecified artificial hip joint: Secondary | ICD-10-CM

## 2014-10-29 DIAGNOSIS — M19012 Primary osteoarthritis, left shoulder: Secondary | ICD-10-CM

## 2014-10-29 NOTE — Assessment & Plan Note (Signed)
If worsening symptoms we'll consider injection. Patient at last injection 4 months ago. Patient is using his significant more secondary to the injury on her right hand. Patient will come back again in 6 weeks if continuing to have trouble.

## 2014-10-29 NOTE — Progress Notes (Signed)
Pre visit review using our clinic review tool, if applicable. No additional management support is needed unless otherwise documented below in the visit note. 

## 2014-10-29 NOTE — Patient Instructions (Signed)
Good to see you Ice is your friend Heel lift in left shoe.  Try a compression sleeve on thigh (tommie copper) with walking.  Continue the iron and take vitamin C 500mg  with it. We are moving in the right direction.  65mg  of elemental daily at least.  Continue the good shoes Exercises to the shoulder 3 times a week and likely just overuse until you get your right hand back.  Try the pennsaid on shoulder if needed See me again in 6 weeks and if shoulder is not better then we will consider injection.

## 2014-10-29 NOTE — Progress Notes (Signed)
[facility] [place], [place] [address] Phone: [phone] Subjective:    I'm seeing this patient by the request  of:  Leeanne Rio, PA-C   Chief complaint: Left hip pain follow-up  JKD:[id number] Rose [name] is a 74 y.o. female coming in with complaint of left hip pain. Reviewing patient's chart patient has had a history of moderate osteophytic changes of the left hip and had injections in 2009. Patient states she also had a hip replacement within a year after her last injection.patient also started on protein supplementation recently because she continued to have the chronic pain as well as weakness in her legs. Patient was doing significant better at last follow-up greater than 3 months ago. Patient states overall she continues to have some pain with walking. States that it seems to be more anterior. States that can sometimes be a snapping sensation. Stopping her from activity still.     Continue the left shoulder pain, stops her from exercises but not daily activities. Patient unfortunately had an injury to her right hand when she cut tendons and needed surgical repair. Patient has been using her left arm a lot more and is having more left shoulder pain. Describes it as a dull aching sensation. No radiation down the arm or any numbness or tingling. States though that it seems to be little worse than previous exam.  Has noticed with patient's fatigue though she is doing significantly better with the [facility] replacement.  Past medical history, social, surgical and family history all reviewed in electronic medical record.  Past Medical History  Diagnosis Date  . Other tenosynovitis of hand and wrist   . Peripheral neuropathy   . Personal history of alcoholism   . DJD (degenerative joint disease) of hip   . Myalgia and myositis, unspecified   . History of depression   . Morton's neuroma     Hx of, Left  . Carotid bruit     Left  .  History of spinal fusion   . Personal history of peptic ulcer disease   . Solitary cyst of breast   . Basal cell carcinoma of face   . Attention deficit disorder without mention of hyperactivity   . Unspecified hypothyroidism   . Benign heart murmur   . Esophageal reflux   . Hypertension   . Pulmonary nodule   . Chronic bronchitis   . Wears glasses   . Abnormal blood pressure     left arm from old brachial artery repair  . Subclavian steal syndrome   . Carotid artery disease   . Atherosclerosis of renal artery   . Centrilobular emphysema   . Depression   . Anxiety   . Anemia    Past Surgical History  Procedure Laterality Date  . Appendectomy    . Tonsillectomy and adenoidectomy    . Left brachial artery repair of pseudoaneurysm  2001    post a cath  . Percutanous transluminal angioplasty of renal arteris    . Lumbar fusion  09/2004    T12-L5 (Dr. Patrice Paradise)  . Cervical fusion  2007    Dr. Valli Glance approach  . Total hip arthroplasty  2008    left  . Shoulder arthroscopy  09/2008    Left  . Orif tibia fracture  2010    Left distal  . Wisdom tooth extraction    . Carpometacarpel suspension plasty Right 08/07/2013    Procedure: CARPOMETACARPEL ([place]) SUSPENSION PLASTY RIGHT THUMB;  Surgeon: Wynonia Sours, MD;  Location: [facility];  Service: Orthopedics;  Laterality: Right;  . Minor irrigation and debridement of wound Right 08/27/2014    Procedure: MINOR IRRIGATION AND DEBRIDEMENT OF WOUND;  Surgeon: Daryll Brod, MD;  Location: [facility];  Service: Orthopedics;  Laterality: Right;  . Joint replacement Left   . Repair extensor tendon Right 09/26/2014    Procedure: REPAIR EXTENSOR TENDON RIGHT RING FINGER ;  Surgeon: Daryll Brod, MD;  Location: [facility];  Service: Orthopedics;  Laterality: Right;    Social History  Substance Use Topics  . Smoking status: Former Smoker -- 1.00 packs/day for 25 years    Types: Cigarettes    Quit  date: 03/16/1983  . Smokeless tobacco: Never Used  . Alcohol Use: Yes     Comment: occ-hx alcohol abuse   The current method of family planning is none. Allergies  Allergen Reactions  . Amoxicillin     REACTION: unspecified  . Codeine   . Hydrocodone-Acetaminophen     REACTION: itching  . Hydromorphone Hcl   . Morphine And Related Hives and Itching  . Morphine Sulfate     REACTION: unspecified  . Nsaids     Pt can't remember what reaction she had   Review of Systems: No headache, visual changes, nausea, vomiting, diarrhea, constipation, dizziness, abdominal pain, skin rash, fevers, chills, night sweats, weight loss, swollen lymph nodes, body aches, joint swelling, muscle aches, chest pain, shortness of breath, mood changes.   Objective Blood pressure 120/62, pulse 88, height 5\' 3"  (1.6 m), weight 114 lb (51.71 kg), SpO2 96 %.  General: No apparent distress alert and oriented x3 mood and affect normal, dressed appropriately. Under weight HEENT: Pupils equal, extraocular movements intact  Respiratory: Patient's speak in full sentences and does not appear short of breath  Cardiovascular: No lower extremity edema, non tender, no erythema  Skin: Warm dry intact with no signs of infection or rash on extremities or on axial skeleton.  Abdomen: Soft nontender  Neuro: Cranial nerves II through XII are intact, neurovascularly intact in all extremities with 2+ DTRs and 2+ pulses.  Lymph: No lymphadenopathy of posterior or anterior cervical chain or axillae bilaterally.  Gait antalgic gait but not using a cane MSK:  Non tender with full range of motion and good stability and symmetric strength and tone of , elbows, wrist,  and ankles bilaterally. Osteophytic changes of multiple joints.  Left hip exam shows the patient's incision is well-healed. No signs of infection. Patient though does have good range of motion but states that she has diffuse tenderness to even light palpation. Improvement  and patient's walking   Leg length discrepancy noted with a half inch shorter on the left leg     Shoulder: left Mild atrophy noted Palpation is normal with no tenderness over AC joint or bicipital groove. ROM is full in all planes passively. Patient does have significant crepitus with range of motion Rotator cuff strength 4-5 but symmetricmild improvement signs of impingement with positive Neer and Hawkin's tests, but negative empty can sign.mild improvement Speeds and Yergason's tests normal. No labral pathology noted with negative Obrien's, negative clunk and good stability. Normal scapular function observed. No painful arc and no drop arm sign. No apprehension sign Contralateral shoulder unremarkable  Impression and Recommendations:     This case required medical decision making of moderate complexity.

## 2014-10-29 NOTE — Assessment & Plan Note (Signed)
Patient does have a leg length discrepancy and I think this could be causing the pain. Workup including x-rays and CT scan were unremarkable for any signs of loosening. Patient continues to take gabapentin, Cymbalta, as well as Zoloft fairly regularly. We discussed icing. Patient with continue to wear good shoes and we will try a heel lift.

## 2014-11-04 ENCOUNTER — Telehealth: Payer: Self-pay | Admitting: Family Medicine

## 2014-11-04 NOTE — Telephone Encounter (Signed)
Patient called to ask for a letter to be written. She wishes to get a dog, but her housing complex will not allow her. She states that she wants to utilize a dog as a service dog to hold her up and help her walk. She advised that she wants to adopt a dog and have it trained to be a service dog.

## 2014-11-05 NOTE — Telephone Encounter (Signed)
I am not optimistic it will help I can try if she wants.

## 2014-11-06 ENCOUNTER — Other Ambulatory Visit: Payer: Self-pay | Admitting: Physician Assistant

## 2014-11-06 NOTE — Telephone Encounter (Signed)
Rx request to pharmacy/SLS  

## 2014-11-11 DIAGNOSIS — D649 Anemia, unspecified: Secondary | ICD-10-CM | POA: Diagnosis not present

## 2014-11-11 DIAGNOSIS — Z01419 Encounter for gynecological examination (general) (routine) without abnormal findings: Secondary | ICD-10-CM | POA: Diagnosis not present

## 2014-11-11 DIAGNOSIS — Z1231 Encounter for screening mammogram for malignant neoplasm of breast: Secondary | ICD-10-CM | POA: Diagnosis not present

## 2014-11-11 DIAGNOSIS — Z1389 Encounter for screening for other disorder: Secondary | ICD-10-CM | POA: Diagnosis not present

## 2014-11-12 DIAGNOSIS — Z85828 Personal history of other malignant neoplasm of skin: Secondary | ICD-10-CM | POA: Diagnosis not present

## 2014-11-12 DIAGNOSIS — L570 Actinic keratosis: Secondary | ICD-10-CM | POA: Diagnosis not present

## 2014-11-12 DIAGNOSIS — Z08 Encounter for follow-up examination after completed treatment for malignant neoplasm: Secondary | ICD-10-CM | POA: Diagnosis not present

## 2014-11-12 DIAGNOSIS — L821 Other seborrheic keratosis: Secondary | ICD-10-CM | POA: Diagnosis not present

## 2014-11-19 ENCOUNTER — Telehealth: Payer: Self-pay | Admitting: Physician Assistant

## 2014-11-19 ENCOUNTER — Ambulatory Visit: Payer: Medicare Other | Admitting: Physician Assistant

## 2014-11-19 NOTE — Telephone Encounter (Signed)
Charge. 

## 2014-11-19 NOTE — Telephone Encounter (Signed)
Pt was no show today 11/19/14 10:00am, pt left VM 11/19/14 10:06am that she just woke up, left msg for pt to reschedule appt, charge for no show?

## 2014-11-20 DIAGNOSIS — Z0289 Encounter for other administrative examinations: Secondary | ICD-10-CM

## 2014-12-03 ENCOUNTER — Telehealth: Payer: Self-pay | Admitting: Physician Assistant

## 2014-12-03 DIAGNOSIS — R2681 Unsteadiness on feet: Secondary | ICD-10-CM

## 2014-12-03 NOTE — Telephone Encounter (Signed)
Relation to VO:UZHQ Call back [phone]   Reason for call:  Patient requesting a physical therapy at [facility] where she lives

## 2014-12-04 NOTE — Telephone Encounter (Signed)
Referral placed.

## 2014-12-10 ENCOUNTER — Encounter: Payer: Self-pay | Admitting: Physician Assistant

## 2014-12-13 ENCOUNTER — Encounter: Payer: Self-pay | Admitting: Physician Assistant

## 2014-12-16 NOTE — Telephone Encounter (Signed)
Caller name: Jerene Pitch  Relation to pt: Engineer, maintenance (IT) back number: [phone]  / fax [phone]   Reason for call:  Jerene Pitch from [facility] called to inform PA of fax and phone number.

## 2014-12-16 NOTE — Telephone Encounter (Signed)
Physician Order completed and faxed to [facility]/SLS

## 2014-12-16 NOTE — Telephone Encounter (Signed)
Spoke with Jerene Pitch at [facility]: Order for PT to Evaluate & Treat; will use following diagnosis from pt's  OV with Dr. [facility]]: ADELIS DOCTER  10/29/2014 1:15 PM  Office Visit  MRN:  [id number]   Description: Female DOB: 1940/11/17  Camiyah Friberg: Lyndal Pulley, DO  Department: [facility]       Diagnoses     Pain due to total hip replacement, subsequent encounter - Primary    ICD-9-CM: V58.89, 996.77, 338.18, V43.64 ICD-10-CM: T84.84XD    Leg length discrepancy     ICD-9-CM: 736.81 ICD-10-CM: M21.70    Arthritis of left shoulder region     ICD-9-CM: 716.91 ICD-10-CM: M12.9       Reason for Visit     Reason for Visit History

## 2014-12-16 NOTE — Telephone Encounter (Signed)
Please call to see what is needed. [facility] note they have already sent over records, etc. Can we make sure the Rehab guys at [facility] have received everything?

## 2014-12-16 NOTE — Telephone Encounter (Signed)
LMOM with contact name and number for return call RE: what orders/information is needed in Physician Order to be faxed for patient's PT per provider request/SLS

## 2014-12-23 DIAGNOSIS — R2689 Other abnormalities of gait and mobility: Secondary | ICD-10-CM | POA: Diagnosis not present

## 2014-12-23 DIAGNOSIS — M6281 Muscle weakness (generalized): Secondary | ICD-10-CM | POA: Diagnosis not present

## 2014-12-31 DIAGNOSIS — M6281 Muscle weakness (generalized): Secondary | ICD-10-CM | POA: Diagnosis not present

## 2014-12-31 DIAGNOSIS — R2689 Other abnormalities of gait and mobility: Secondary | ICD-10-CM | POA: Diagnosis not present

## 2015-01-03 DIAGNOSIS — R2689 Other abnormalities of gait and mobility: Secondary | ICD-10-CM | POA: Diagnosis not present

## 2015-01-03 DIAGNOSIS — M6281 Muscle weakness (generalized): Secondary | ICD-10-CM | POA: Diagnosis not present

## 2015-01-06 ENCOUNTER — Other Ambulatory Visit: Payer: Self-pay | Admitting: Physician Assistant

## 2015-01-09 DIAGNOSIS — M6281 Muscle weakness (generalized): Secondary | ICD-10-CM | POA: Diagnosis not present

## 2015-01-09 DIAGNOSIS — R2689 Other abnormalities of gait and mobility: Secondary | ICD-10-CM | POA: Diagnosis not present

## 2015-01-15 DIAGNOSIS — R2689 Other abnormalities of gait and mobility: Secondary | ICD-10-CM | POA: Diagnosis not present

## 2015-01-15 DIAGNOSIS — R2681 Unsteadiness on feet: Secondary | ICD-10-CM | POA: Diagnosis not present

## 2015-01-15 DIAGNOSIS — M6281 Muscle weakness (generalized): Secondary | ICD-10-CM | POA: Diagnosis not present

## 2015-01-15 DIAGNOSIS — M25552 Pain in left hip: Secondary | ICD-10-CM | POA: Diagnosis not present

## 2015-01-16 DIAGNOSIS — R2681 Unsteadiness on feet: Secondary | ICD-10-CM | POA: Diagnosis not present

## 2015-01-16 DIAGNOSIS — M25552 Pain in left hip: Secondary | ICD-10-CM | POA: Diagnosis not present

## 2015-01-16 DIAGNOSIS — M6281 Muscle weakness (generalized): Secondary | ICD-10-CM | POA: Diagnosis not present

## 2015-01-16 DIAGNOSIS — R2689 Other abnormalities of gait and mobility: Secondary | ICD-10-CM | POA: Diagnosis not present

## 2015-01-18 ENCOUNTER — Other Ambulatory Visit: Payer: Self-pay | Admitting: Physician Assistant

## 2015-01-21 DIAGNOSIS — M6281 Muscle weakness (generalized): Secondary | ICD-10-CM | POA: Diagnosis not present

## 2015-01-21 DIAGNOSIS — R2689 Other abnormalities of gait and mobility: Secondary | ICD-10-CM | POA: Diagnosis not present

## 2015-01-21 DIAGNOSIS — R2681 Unsteadiness on feet: Secondary | ICD-10-CM | POA: Diagnosis not present

## 2015-01-21 DIAGNOSIS — M25552 Pain in left hip: Secondary | ICD-10-CM | POA: Diagnosis not present

## 2015-01-28 ENCOUNTER — Ambulatory Visit (INDEPENDENT_AMBULATORY_CARE_PROVIDER_SITE_OTHER): Payer: Medicare Other | Admitting: Physician Assistant

## 2015-01-28 ENCOUNTER — Encounter: Payer: Self-pay | Admitting: Physician Assistant

## 2015-01-28 VITALS — BP 102/48 | HR 70 | Temp 97.6°F | Resp 14 | Ht 63.0 in | Wt 122.4 lb

## 2015-01-28 DIAGNOSIS — I10 Essential (primary) hypertension: Secondary | ICD-10-CM

## 2015-01-28 DIAGNOSIS — R2681 Unsteadiness on feet: Secondary | ICD-10-CM | POA: Diagnosis not present

## 2015-01-28 DIAGNOSIS — J019 Acute sinusitis, unspecified: Secondary | ICD-10-CM

## 2015-01-28 DIAGNOSIS — B9689 Other specified bacterial agents as the cause of diseases classified elsewhere: Secondary | ICD-10-CM

## 2015-01-28 DIAGNOSIS — M25552 Pain in left hip: Secondary | ICD-10-CM | POA: Diagnosis not present

## 2015-01-28 DIAGNOSIS — M6281 Muscle weakness (generalized): Secondary | ICD-10-CM | POA: Diagnosis not present

## 2015-01-28 DIAGNOSIS — R2689 Other abnormalities of gait and mobility: Secondary | ICD-10-CM | POA: Diagnosis not present

## 2015-01-28 MED ORDER — AZITHROMYCIN 250 MG PO TABS: ORAL_TABLET | ORAL | Status: DC

## 2015-01-28 NOTE — Assessment & Plan Note (Signed)
Rx Azithromycin.  Increase fluids.  Rest.  Saline nasal spray.  Probiotic.  Mucinex as directed.  Humidifier in bedroom.  Call or return to clinic if symptoms are not improving.  

## 2015-01-28 NOTE — Assessment & Plan Note (Signed)
Will decrease amlodipine to 5 mg daily due to low BP. Recommend increase fluids and eat a well-balanced diet.  Check BP at home daily and follow-up with numbers in 1 week.

## 2015-01-28 NOTE — Patient Instructions (Signed)
Please take antibiotic as directed.  Increase fluid intake.  Use Saline nasal spray.  Take a daily multivitamin. Continue Mucinex.  Place a humidifier in the bedroom.  Please call or return clinic if symptoms are not improving.  Please decrease the amlodipine to 1/2 tablet daily. Stay hydrated. Check BP daily and call me with these values in 1 week. This way we can make further changes if needed.  Sinusitis Sinusitis is redness, soreness, and swelling (inflammation) of the paranasal sinuses. Paranasal sinuses are air pockets within the bones of your face (beneath the eyes, the middle of the forehead, or above the eyes). In healthy paranasal sinuses, mucus is able to drain out, and air is able to circulate through them by way of your nose. However, when your paranasal sinuses are inflamed, mucus and air can become trapped. This can allow bacteria and other germs to grow and cause infection. Sinusitis can develop quickly and last only a short time (acute) or continue over a long period (chronic). Sinusitis that lasts for more than 12 weeks is considered chronic.  CAUSES  Causes of sinusitis include:  Allergies.  Structural abnormalities, such as displacement of the cartilage that separates your nostrils (deviated septum), which can decrease the air flow through your nose and sinuses and affect sinus drainage.  Functional abnormalities, such as when the small hairs (cilia) that line your sinuses and help remove mucus do not work properly or are not present. SYMPTOMS  Symptoms of acute and chronic sinusitis are the same. The primary symptoms are pain and pressure around the affected sinuses. Other symptoms include:  Upper toothache.  Earache.  Headache.  Bad breath.  Decreased sense of smell and taste.  A cough, which worsens when you are lying flat.  Fatigue.  Fever.  Thick drainage from your nose, which often is green and may contain pus (purulent).  Swelling and warmth over the  affected sinuses. DIAGNOSIS  Your caregiver will perform a physical exam. During the exam, your caregiver may:  Look in your nose for signs of abnormal growths in your nostrils (nasal polyps).  Tap over the affected sinus to check for signs of infection.  View the inside of your sinuses (endoscopy) with a special imaging device with a light attached (endoscope), which is inserted into your sinuses. If your caregiver suspects that you have chronic sinusitis, one or more of the following tests may be recommended:  Allergy tests.  Nasal culture A sample of mucus is taken from your nose and sent to a lab and screened for bacteria.  Nasal cytology A sample of mucus is taken from your nose and examined by your caregiver to determine if your sinusitis is related to an allergy. TREATMENT  Most cases of acute sinusitis are related to a viral infection and will resolve on their own within 10 days. Sometimes medicines are prescribed to help relieve symptoms (pain medicine, decongestants, nasal steroid sprays, or saline sprays).  However, for sinusitis related to a bacterial infection, your caregiver will prescribe antibiotic medicines. These are medicines that will help kill the bacteria causing the infection.  Rarely, sinusitis is caused by a fungal infection. In theses cases, your caregiver will prescribe antifungal medicine. For some cases of chronic sinusitis, surgery is needed. Generally, these are cases in which sinusitis recurs more than 3 times per year, despite other treatments. HOME CARE INSTRUCTIONS   Drink plenty of water. Water helps thin the mucus so your sinuses can drain more easily.  Use a humidifier.  Inhale steam 3 to 4 times a day (for example, sit in the bathroom with the shower running).  Apply a warm, moist washcloth to your face 3 to 4 times a day, or as directed by your caregiver.  Use saline nasal sprays to help moisten and clean your sinuses.  Take over-the-counter or  prescription medicines for pain, discomfort, or fever only as directed by your caregiver. SEEK IMMEDIATE MEDICAL CARE IF:  You have increasing pain or severe headaches.  You have nausea, vomiting, or drowsiness.  You have swelling around your face.  You have vision problems.  You have a stiff neck.  You have difficulty breathing. MAKE SURE YOU:   Understand these instructions.  Will watch your condition.  Will get help right away if you are not doing well or get worse. Document Released: 03/01/2005 Document Revised: 05/24/2011 Document Reviewed: 03/16/2011 [facility] Patient Information 2014 [place], [place].

## 2015-01-28 NOTE — Progress Notes (Signed)
Pre visit review using our clinic review tool, if applicable. No additional management support is needed unless otherwise documented below in the visit note/SLS  

## 2015-01-28 NOTE — Progress Notes (Signed)
Patient presents to clinic today c/o 1.5 weeks of productive cough, chest congestion, PND with fatigue. Denies fever, chills, chest pain or sinus symptoms.  Endorses symptoms initially improving but have worsened. Endorses now eye redness and drainage with crusting first noticed this morning.  Past Medical History  Diagnosis Date  . Other tenosynovitis of hand and wrist   . Peripheral neuropathy ([place])   . Personal history of alcoholism ([place])   . DJD (degenerative joint disease) of hip   . Myalgia and myositis, unspecified   . History of depression   . Morton's neuroma     Hx of, Left  . Carotid bruit     Left  . History of spinal fusion   . Personal history of peptic ulcer disease   . Solitary cyst of breast   . Basal cell carcinoma of face   . Attention deficit disorder without mention of hyperactivity   . Unspecified hypothyroidism   . Benign heart murmur   . Esophageal reflux   . Hypertension   . Pulmonary nodule   . Chronic bronchitis ([place])   . Wears glasses   . Abnormal blood pressure     left arm from old brachial artery repair  . Subclavian steal syndrome   . Carotid artery disease ([place])   . Atherosclerosis of renal artery ([place])   . Centrilobular emphysema ([place])   . Depression   . Anxiety   . Anemia     Current Outpatient Prescriptions on File Prior to Visit  Medication Sig Dispense Refill  . amLODipine (NORVASC) 10 MG tablet TAKE 1 TABLET DAILY 90 tablet 1  . aspirin 81 MG tablet Take 81 mg by mouth daily.     . cloNIDine (CATAPRES) 0.1 MG tablet TAKE 1 TABLET BY MOUTH TWICE DAILY 60 tablet 2  . diazepam (VALIUM) 5 MG tablet Take 1 tablet (5 mg total) by mouth every 12 (twelve) hours as needed for anxiety or muscle spasms (use sparingly). 30 tablet 2  . DULoxetine (CYMBALTA) 60 MG capsule TAKE 1 CAPSULE (60 MG TOTAL) BY MOUTH DAILY. 30 capsule 2  . ferrous sulfate dried (SLOW FE) 160 (50 FE) MG TBCR Take 160 mg by mouth 2 (two) times daily.     Marland Kitchen gabapentin  (NEURONTIN) 600 MG tablet TAKE 1 TABLET (600 MG TOTAL) BY MOUTH 3 (THREE) TIMES DAILY. 90 tablet 3  . Glucosamine 500 MG TABS Take 1 tablet by mouth 2 (two) times daily. Glucosamine-Chondronton    . L-Methylfolate 15 MG TABS TAKE ONE (1) TABLET EACH DAY 30 tablet 3  . levothyroxine (SYNTHROID, LEVOTHROID) 112 MCG tablet TAKE 1 TABLET (112 MCG TOTAL) BY MOUTH DAILY BEFORE BREAKFAST. 30 tablet 2  . STRATTERA 100 MG capsule TAKE ONE CAPSULE BY MOUTH DAILY 30 capsule 5  . valsartan (DIOVAN) 160 MG tablet Take 1.5 tablets by mouth daily. 135 tablet 1  . VESICARE 10 MG tablet TAKE 1 TABLET BY MOUTH DAILY 30 tablet 5   No current facility-administered medications on file prior to visit.    Allergies  Allergen Reactions  . Amoxicillin     REACTION: unspecified  . Codeine   . Hydrocodone-Acetaminophen     REACTION: itching  . Hydromorphone Hcl   . Morphine And Related Hives and Itching  . Morphine Sulfate     REACTION: unspecified  . Nsaids     Pt can't remember what reaction she had    Family History  Problem Relation Age of Onset  . Adopted:  Yes  . Schizophrenia Son     Deceased 14  . Hypertension Daughter     Renal  . Healthy Son     x1  . Healthy Daughter     x2    Social History   Social History  . Marital Status: Single    Spouse Name: N/A  . Number of Children: Y  . Years of Education: N/A   Occupational History  . retired     Engineer, maintenance (IT), had own firm   Social History Main Topics  . Smoking status: Former Smoker -- 1.00 packs/day for 25 years    Types: Cigarettes    Quit date: 03/16/1983  . Smokeless tobacco: Never Used  . Alcohol Use: Yes     Comment: occ-hx alcohol abuse  . Drug Use: No  . Sexual Activity: Not Asked   Other Topics Concern  . None   Social History Narrative   [employer]-accounting      Married '60-46yr divorced; '84-2 years, divorced      3 daughters- '61, '70, '71; 2 sons-'53,'55 (schizophrenic-died); 10 grandchildren       Lives alone with 1 dog and 3 cats      Pt unsure of family history- was adopted.                  Review of Systems - See HPI.  All other ROS are negative.  BP 102/48 mmHg  Pulse 70  Temp(Src) 97.6 F (36.4 C) (Oral)  Resp 14  Ht 5\' 3"  (1.6 m)  Wt 122 lb 6 oz (55.509 kg)  BMI 21.68 kg/m2  SpO2 99%  Physical Exam  Constitutional: She is oriented to person, place, and time and well-developed, well-nourished, and in no distress.  HENT:  Head: Normocephalic and atraumatic.  Right Ear: External ear normal.  Left Ear: External ear normal.  Nose: Nose normal.  Mouth/Throat: Oropharynx is clear and moist. No oropharyngeal exudate.  TM within normal limit bilaterally.  Eyes: Conjunctivae are normal.  Neck: Neck supple.  Cardiovascular: Normal rate, regular rhythm, normal heart sounds and intact distal pulses.   Pulmonary/Chest: Effort normal and breath sounds normal. No respiratory distress. She has no wheezes. She has no rales. She exhibits no tenderness.  Neurological: She is alert and oriented to person, place, and time.  Skin: Skin is warm and dry. No rash noted.  Psychiatric: Affect normal.  Vitals reviewed.  Assessment/Plan: Essential hypertension Will decrease amlodipine to 5 mg daily due to low BP. Recommend increase fluids and eat a well-balanced diet.  Check BP at home daily and follow-up with numbers in 1 week.  Acute bacterial sinusitis Rx Azithromycin.  Increase fluids.  Rest.  Saline nasal spray.  Probiotic.  Mucinex as directed.  Humidifier in bedroom.  Call or return to clinic if symptoms are not improving.

## 2015-01-30 DIAGNOSIS — M25552 Pain in left hip: Secondary | ICD-10-CM | POA: Diagnosis not present

## 2015-01-30 DIAGNOSIS — R2689 Other abnormalities of gait and mobility: Secondary | ICD-10-CM | POA: Diagnosis not present

## 2015-01-30 DIAGNOSIS — R2681 Unsteadiness on feet: Secondary | ICD-10-CM | POA: Diagnosis not present

## 2015-01-30 DIAGNOSIS — M6281 Muscle weakness (generalized): Secondary | ICD-10-CM | POA: Diagnosis not present

## 2015-01-31 ENCOUNTER — Telehealth: Payer: Self-pay | Admitting: Physician Assistant

## 2015-01-31 DIAGNOSIS — M1812 Unilateral primary osteoarthritis of first carpometacarpal joint, left hand: Secondary | ICD-10-CM | POA: Diagnosis not present

## 2015-01-31 MED ORDER — ERYTHROMYCIN 5 MG/GM OP OINT: TOPICAL_OINTMENT | OPHTHALMIC | Status: DC

## 2015-01-31 NOTE — Telephone Encounter (Signed)
Antibiotic given for sinusitis. Ok to send in Rx Romycin ointment to apply TID to affected eye.

## 2015-01-31 NOTE — Telephone Encounter (Signed)
Patient informed, understood & agreed, new Rx to pharmacy/SLS

## 2015-01-31 NOTE — Telephone Encounter (Signed)
Caller name:Self   Can be reached: [phone]  Reason for call: Eye is still sore even with antibiotics given 2 days ago. States that eye is worse than it was then

## 2015-01-31 NOTE — Addendum Note (Signed)
Addended by: Rockwell Germany on: 01/31/2015 04:52 PM   Modules accepted: Orders

## 2015-02-25 ENCOUNTER — Telehealth: Payer: Self-pay | Admitting: Physician Assistant

## 2015-02-25 NOTE — Telephone Encounter (Signed)
.  mychart

## 2015-03-04 ENCOUNTER — Encounter: Payer: Self-pay | Admitting: Family Medicine

## 2015-03-04 ENCOUNTER — Ambulatory Visit (INDEPENDENT_AMBULATORY_CARE_PROVIDER_SITE_OTHER): Payer: Medicare Other | Admitting: Family Medicine

## 2015-03-04 ENCOUNTER — Other Ambulatory Visit (INDEPENDENT_AMBULATORY_CARE_PROVIDER_SITE_OTHER): Payer: Medicare Other

## 2015-03-04 VITALS — BP 96/58 | HR 75 | Ht 63.0 in | Wt 122.0 lb

## 2015-03-04 DIAGNOSIS — T8484XD Pain due to internal orthopedic prosthetic devices, implants and grafts, subsequent encounter: Secondary | ICD-10-CM

## 2015-03-04 DIAGNOSIS — M25552 Pain in left hip: Secondary | ICD-10-CM | POA: Diagnosis not present

## 2015-03-04 DIAGNOSIS — M6580 Other synovitis and tenosynovitis, unspecified site: Secondary | ICD-10-CM

## 2015-03-04 DIAGNOSIS — Z96649 Presence of unspecified artificial hip joint: Secondary | ICD-10-CM

## 2015-03-04 DIAGNOSIS — M6289 Other specified disorders of muscle: Secondary | ICD-10-CM | POA: Insufficient documentation

## 2015-03-04 NOTE — Assessment & Plan Note (Signed)
No loosening noted on CT scan in 2016.

## 2015-03-04 NOTE — Progress Notes (Signed)
Pre visit review using our clinic review tool, if applicable. No additional management support is needed unless otherwise documented below in the visit note. 

## 2015-03-04 NOTE — Assessment & Plan Note (Signed)
Patient given injection today surrounding an area that was a potential calcine 5 loose body within the muscle belly itself. Possibly calcified nerve. Patient didn't feel somewhat better but did have some pain from the injection itself. We discussed that patient has many other comorbidities and her contributing. We discussed the possibility of this being more of a lumbar radiculopathy which patient is adamant it is not. Discussed with patient I wanted doing anything more aggressive than the secondary to patient having a total hip and she would be an increased risk of infection. We did discuss this at great length before the even to superficial injection. Patient is going to continue to try to increase her activity and given a trial of topical anti-inflammatory. We discussed icing heat as well as manual massage to break up the calcium and vitamin D supplementation. Discussed with patient and no visible find true reason to her pain and could be secondary to the neuropathy. Patient will come back and see me again in 3-4 weeks for further evaluation and treatment.  Spent  25 minutes with patient face-to-face and had greater than 50% of counseling including as described above in assessment and plan.

## 2015-03-04 NOTE — Patient Instructions (Signed)
Good to see you  Ice is your friend Try a massager on the lateral hip and break up the calcium Vitamin D 2000 IU daily pennsaid pinkie amount topically 2 times daily as needed.  Give it at least 3 weeks.  See me again in 3-4 weeks to see how you are dong Happy holidays!

## 2015-03-04 NOTE — Progress Notes (Signed)
[name] [place],  [address] Phone: [phone] Subjective:     Chief complaint: Left hip pain follow-up  [id number] Rose [name] is a 74 y.o. female coming in with complaint of left hip pain. Reviewing patient's chart patient has had a history of moderate osteophytic changes of the left hip and had injections in 2009. Patient did have hip replacement. Patient was seen previously and we did get a CT scan of the hip. No significant loosening noted. Last time we saw him patient was back in August. Patient states pain seems to be a little different. Seems to be more on the lateral aspect of the hip. Seems to be worse with activity. States that it can be a sharp pain. Not as much groin pain. States that the muscle pain she was having has improved. Continues to try to stay active. States that the pain that can be intolerable that keeps her from activities. Patient is needing some type of relief.       Has noticed with patient's fatigue though she is doing significantly better with the [facility] replacement.  Past medical history, social, surgical and family history all reviewed in electronic medical record.  Past Medical History  Diagnosis Date  . Other tenosynovitis of hand and wrist   . Peripheral neuropathy ([place])   . Personal history of alcoholism ([place])   . DJD (degenerative joint disease) of hip   . Myalgia and myositis, unspecified   . History of depression   . Morton's neuroma     Hx of, Left  . Carotid bruit     Left  . History of spinal fusion   . Personal history of peptic ulcer disease   . Solitary cyst of breast   . Basal cell carcinoma of face   . Attention deficit disorder without mention of hyperactivity   . Unspecified hypothyroidism   . Benign heart murmur   . Esophageal reflux   . Hypertension   . Pulmonary nodule   . Chronic bronchitis ([place])   . Wears glasses   . Abnormal blood pressure     left arm from  old brachial artery repair  . Subclavian steal syndrome   . Carotid artery disease ([place])   . Atherosclerosis of renal artery ([place])   . Centrilobular emphysema ([place])   . Depression   . Anxiety   . Anemia    Past Surgical History  Procedure Laterality Date  . Appendectomy    . Tonsillectomy and adenoidectomy    . Left brachial artery repair of pseudoaneurysm  2001    post a cath  . Percutanous transluminal angioplasty of renal arteris    . Lumbar fusion  09/2004    T12-L5 (Dr. Patrice Paradise)  . Cervical fusion  2007    Dr. Valli Glance approach  . Total hip arthroplasty  2008    left  . Shoulder arthroscopy  09/2008    Left  . Orif tibia fracture  2010    Left distal  . Wisdom tooth extraction    . Carpometacarpel suspension plasty Right 08/07/2013    Procedure: CARPOMETACARPEL [facility]) SUSPENSION PLASTY RIGHT THUMB;  Surgeon: Wynonia Sours, MD;  Location: [place];  Service: Orthopedics;  Laterality: Right;  . Minor irrigation and debridement of wound Right 08/27/2014    Procedure: MINOR IRRIGATION AND DEBRIDEMENT OF WOUND;  Surgeon: Daryll Brod, MD;  Location: [place];  Service: Orthopedics;  Laterality: Right;  .  Joint replacement Left   . Repair extensor tendon Right 09/26/2014    Procedure: REPAIR EXTENSOR TENDON RIGHT RING FINGER ;  Surgeon: Daryll Brod, MD;  Location: [place];  Service: Orthopedics;  Laterality: Right;    Social History  Substance Use Topics  . Smoking status: Former Smoker -- 1.00 packs/day for 25 years    Types: Cigarettes    Quit date: 03/16/1983  . Smokeless tobacco: Never Used  . Alcohol Use: Yes     Comment: occ-hx alcohol abuse   The current method of family planning is none. Allergies  Allergen Reactions  . Amoxicillin     REACTION: unspecified  . Codeine   . Hydrocodone-Acetaminophen     REACTION: itching  . Hydromorphone Hcl   . Morphine And Related Hives and Itching  . Morphine Sulfate      REACTION: unspecified  . Nsaids     Pt can't remember what reaction she had   Review of Systems: No headache, visual changes, nausea, vomiting, diarrhea, constipation, dizziness, abdominal pain, skin rash, fevers, chills, night sweats, weight loss, swollen lymph nodes, body aches, joint swelling, muscle aches, chest pain, shortness of breath, mood changes.   Objective Blood pressure 96/58, pulse 75, height 5\' 3"  (1.6 m), weight 122 lb (55.339 kg), SpO2 96 %.  General: No apparent distress alert and oriented x3 mood and affect normal, dressed appropriately. Under weight HEENT: Pupils equal, extraocular movements intact  Respiratory: Patient's speak in full sentences and does not appear short of breath  Cardiovascular: No lower extremity edema, non tender, no erythema  Skin: Warm dry intact with no signs of infection or rash on extremities or on axial skeleton.  Abdomen: Soft nontender  Neuro: Cranial nerves II through XII are intact, neurovascularly intact in all extremities with 2+ DTRs and 2+ pulses.  Lymph: No lymphadenopathy of posterior or anterior cervical chain or axillae bilaterally.  Gait antalgic gait but not using a cane MSK:  Non tender with full range of motion and good stability and symmetric strength and tone of , elbows, wrist,  and ankles bilaterally. Osteophytic changes of multiple joints.  Left hip exam shows the patient's incision is well-healed. No signs of infection. Patient though does have good range of motion tenderness seems to be more localized on the lateral aspect of the hip just superior to the greater trochanteric area. Seems to be mostly on the midline.  MSK US performed of: left This study was ordered, performed, and interpreted by Charlann Boxer D.O.  Hip: Trochanteric bursa Not seen. Patient though has an overlying tensor fascia lata in this area that does have what appears to be a calcific nerve. Mild increase in Doppler flow. No mass appreciated. Surrounding  hypoechoic changes. Acetabular labrum visualized and without tears, displacement, or effusion in joint. Femoral neck appears unremarkable without increased power doppler signal along [place].  IMPRESSION:  Question will tensor fascia long time syndrome with questionable calcific body.  Procedure: Real-time Ultrasound Guided Injection of left anterior fascia lata Device: GE Logiq E  Ultrasound guided injection is preferred based studies that show increased duration, increased effect, greater accuracy, decreased procedural pain, increased response rate, and decreased cost with ultrasound guided versus blind injection.  Verbal informed consent obtained.  Time-out conducted.  Noted no overlying erythema, induration, or other signs of local infection.  Skin prepped in a sterile fashion.  Local anesthesia: Topical Ethyl chloride.  With sterile technique and under real time ultrasound guidance:  Stayed significant superficial  from patient's replacement. Went to the calcific area and did have an injection of a total of 3 mL of 0.5% Marcaine and 1 mL of Kenalog 40 mg/dL. Completed without difficulty  Pain immediately resolved suggesting accurate placement of the medication.  Advised to call if fevers/chills, erythema, induration, drainage, or persistent bleeding.  Images permanently stored and available for review in the ultrasound unit.  Impression: Technically successful ultrasound guided injection.   Impression and Recommendations:     This case required medical decision making of moderate complexity.

## 2015-03-11 ENCOUNTER — Telehealth: Payer: Self-pay

## 2015-03-11 ENCOUNTER — Other Ambulatory Visit: Payer: Self-pay | Admitting: *Deleted

## 2015-03-11 DIAGNOSIS — I10 Essential (primary) hypertension: Secondary | ICD-10-CM

## 2015-03-11 MED ORDER — VALSARTAN 160 MG PO TABS: ORAL_TABLET | ORAL | Status: DC

## 2015-03-11 NOTE — Telephone Encounter (Signed)
Left msg to schedule AWV ort CPE for patient

## 2015-03-11 NOTE — Telephone Encounter (Signed)
Rx sent to the pharmacy by e-script.//AB/CMA 

## 2015-03-12 ENCOUNTER — Encounter: Payer: Self-pay | Admitting: Family Medicine

## 2015-03-26 ENCOUNTER — Ambulatory Visit: Payer: Medicare Other | Admitting: Physician Assistant

## 2015-03-28 ENCOUNTER — Encounter: Payer: Self-pay | Admitting: Physician Assistant

## 2015-03-28 NOTE — Telephone Encounter (Signed)
Marked to charge and mailing letter °

## 2015-03-28 NOTE — Telephone Encounter (Signed)
charge 

## 2015-03-28 NOTE — Telephone Encounter (Signed)
Pt was no show 03/26/15 1:30pm for [facility], pt has not rescheduled, conf with pt 03/25/15, charge or no charge?

## 2015-04-04 NOTE — Telephone Encounter (Signed)
Patient states that she called morning of 03/26/15 notifying us that she fell and was not able to make it in for appt. Pt states she did not remember who she spoke with. Still charge?

## 2015-04-04 NOTE — Telephone Encounter (Signed)
[place] - please do not charge pt no show fee for [facility] 03/26/15 Charlena Cross - another pt that states calling in and no record of call

## 2015-04-04 NOTE — Telephone Encounter (Signed)
No charge. 

## 2015-04-08 ENCOUNTER — Telehealth: Payer: Self-pay

## 2015-04-08 NOTE — Telephone Encounter (Signed)
[place] patient for pre-visit information.

## 2015-04-09 ENCOUNTER — Encounter: Payer: Self-pay | Admitting: Physician Assistant

## 2015-04-09 ENCOUNTER — Ambulatory Visit (INDEPENDENT_AMBULATORY_CARE_PROVIDER_SITE_OTHER): Payer: Medicare Other | Admitting: Physician Assistant

## 2015-04-09 VITALS — BP 108/62 | HR 104 | Temp 98.7°F | Ht 63.0 in | Wt 119.1 lb

## 2015-04-09 DIAGNOSIS — E039 Hypothyroidism, unspecified: Secondary | ICD-10-CM

## 2015-04-09 DIAGNOSIS — Z780 Asymptomatic menopausal state: Secondary | ICD-10-CM

## 2015-04-09 DIAGNOSIS — Z0000 Encounter for general adult medical examination without abnormal findings: Secondary | ICD-10-CM

## 2015-04-09 DIAGNOSIS — R809 Proteinuria, unspecified: Secondary | ICD-10-CM

## 2015-04-09 DIAGNOSIS — F329 Major depressive disorder, single episode, unspecified: Secondary | ICD-10-CM

## 2015-04-09 DIAGNOSIS — F32A Depression, unspecified: Secondary | ICD-10-CM

## 2015-04-09 DIAGNOSIS — I10 Essential (primary) hypertension: Secondary | ICD-10-CM

## 2015-04-09 DIAGNOSIS — F909 Attention-deficit hyperactivity disorder, unspecified type: Secondary | ICD-10-CM

## 2015-04-09 DIAGNOSIS — F988 Other specified behavioral and emotional disorders with onset usually occurring in childhood and adolescence: Secondary | ICD-10-CM

## 2015-04-09 MED ORDER — BUPROPION HCL ER (SR) 100 MG PO TB12: 100.0000 mg | ORAL_TABLET | Freq: Two times a day (BID) | ORAL | Status: DC

## 2015-04-09 NOTE — Patient Instructions (Signed)
Please go to the lab for blood work. I will call with your results. You will be contacted to schedule your bone density test.  Please reduce the Diovan to 1 tablet daily only. Continue the amlodipine as directed.  Since we are stopping the Strattera we will add-on low-dose Wellbutrin for your ADD and mood. Continue the Cymbalta as directed.  Follow-up with me in 1 month.  Follow-up with Dr. Tamala Julian as scheduled.  Preventive Care for Adults, Female A healthy lifestyle and preventive care can promote health and wellness. Preventive health guidelines for women include the following key practices.  A routine yearly physical is a good way to check with your health care provider about your health and preventive screening. It is a chance to share any concerns and updates on your health and to receive a thorough exam.  Visit your dentist for a routine exam and preventive care every 6 months. Brush your teeth twice a day and floss once a day. Good oral hygiene prevents tooth decay and gum disease.  The frequency of eye exams is based on your age, health, family medical history, use of contact lenses, and other factors. Follow your health care provider's recommendations for frequency of eye exams.  Eat a healthy diet. Foods like vegetables, fruits, whole grains, low-fat dairy products, and lean protein foods contain the nutrients you need without too many calories. Decrease your intake of foods high in solid fats, added sugars, and salt. Eat the right amount of calories for you.Get information about a proper diet from your health care provider, if necessary.  Regular physical exercise is one of the most important things you can do for your health. Most adults should get at least 150 minutes of moderate-intensity exercise (any activity that increases your heart rate and causes you to sweat) each week. In addition, most adults need muscle-strengthening exercises on 2 or more days a week.  Maintain a  healthy weight. The body mass index (BMI) is a screening tool to identify possible weight problems. It provides an estimate of body fat based on height and weight. Your health care provider can find your BMI and can help you achieve or maintain a healthy weight.For adults 20 years and older:  A BMI below 18.5 is considered underweight.  A BMI of 18.5 to 24.9 is normal.  A BMI of 25 to 29.9 is considered overweight.  A BMI of 30 and above is considered obese.  Maintain normal blood lipids and cholesterol levels by exercising and minimizing your intake of saturated fat. Eat a balanced diet with plenty of fruit and vegetables. Blood tests for lipids and cholesterol should begin at age 46 and be repeated every 5 years. If your lipid or cholesterol levels are high, you are over 50, or you are at high risk for heart disease, you may need your cholesterol levels checked more frequently.Ongoing high lipid and cholesterol levels should be treated with medicines if diet and exercise are not working.  If you smoke, find out from your health care provider how to quit. If you do not use tobacco, do not start.  Lung cancer screening is recommended for adults aged 25-80 years who are at high risk for developing lung cancer because of a history of smoking. A yearly low-dose CT scan of the lungs is recommended for people who have at least a 30-pack-year history of smoking and are a current smoker or have quit within the past 15 years. A pack year of smoking is smoking an  average of 1 pack of cigarettes a day for 1 year (for example: 1 pack a day for 30 years or 2 packs a day for 15 years). Yearly screening should continue until the smoker has stopped smoking for at least 15 years. Yearly screening should be stopped for people who develop a health problem that would prevent them from having lung cancer treatment.  If you are pregnant, do not drink alcohol. If you are breastfeeding, be very cautious about drinking  alcohol. If you are not pregnant and choose to drink alcohol, do not have more than 1 drink per day. One drink is considered to be 12 ounces (355 mL) of beer, 5 ounces (148 mL) of wine, or 1.5 ounces (44 mL) of liquor.  Avoid use of street drugs. Do not share needles with anyone. Ask for help if you need support or instructions about stopping the use of drugs.  High blood pressure causes heart disease and increases the risk of stroke. Your blood pressure should be checked at least every 1 to 2 years. Ongoing high blood pressure should be treated with medicines if weight loss and exercise do not work.  If you are 16-68 years old, ask your health care provider if you should take aspirin to prevent strokes.  Diabetes screening is done by taking a blood sample to check your blood glucose level after you have not eaten for a certain period of time (fasting). If you are not overweight and you do not have risk factors for diabetes, you should be screened once every 3 years starting at age 23. If you are overweight or obese and you are 35-34 years of age, you should be screened for diabetes every year as part of your cardiovascular risk assessment.  Breast cancer screening is essential preventive care for women. You should practice "breast self-awareness." This means understanding the normal appearance and feel of your breasts and may include breast self-examination. Any changes detected, no matter how small, should be reported to a health care provider. Women in their 43s and 30s should have a clinical breast exam (CBE) by a health care provider as part of a regular health exam every 1 to 3 years. After age 75, women should have a CBE every year. Starting at age 32, women should consider having a mammogram (breast X-ray test) every year. Women who have a family history of breast cancer should talk to their health care provider about genetic screening. Women at a high risk of breast cancer should talk to their  health care providers about having an MRI and a mammogram every year.  Breast cancer gene (BRCA)-related cancer risk assessment is recommended for women who have family members with BRCA-related cancers. BRCA-related cancers include breast, ovarian, tubal, and peritoneal cancers. Having family members with these cancers may be associated with an increased risk for harmful changes (mutations) in the breast cancer genes BRCA1 and BRCA2. Results of the assessment will determine the need for genetic counseling and BRCA1 and BRCA2 testing.  Your health care provider may recommend that you be screened regularly for cancer of the pelvic organs (ovaries, uterus, and vagina). This screening involves a pelvic examination, including checking for microscopic changes to the surface of your cervix (Pap test). You may be encouraged to have this screening done every 3 years, beginning at age 25.  For women ages 6-65, health care providers may recommend pelvic exams and Pap testing every 3 years, or they may recommend the Pap and pelvic exam, combined with  testing for human papilloma virus (HPV), every 5 years. Some types of HPV increase your risk of cervical cancer. Testing for HPV may also be done on women of any age with unclear Pap test results.  Other health care providers may not recommend any screening for nonpregnant women who are considered low risk for pelvic cancer and who do not have symptoms. Ask your health care provider if a screening pelvic exam is right for you.  If you have had past treatment for cervical cancer or a condition that could lead to cancer, you need Pap tests and screening for cancer for at least 20 years after your treatment. If Pap tests have been discontinued, your risk factors (such as having a new sexual partner) need to be reassessed to determine if screening should resume. Some women have medical problems that increase the chance of getting cervical cancer. In these cases, your health  care provider may recommend more frequent screening and Pap tests.  Colorectal cancer can be detected and often prevented. Most routine colorectal cancer screening begins at the age of 26 years and continues through age 72 years. However, your health care provider may recommend screening at an earlier age if you have risk factors for colon cancer. On a yearly basis, your health care provider may provide home test kits to check for hidden blood in the stool. Use of a small camera at the end of a tube, to directly examine the colon (sigmoidoscopy or colonoscopy), can detect the earliest forms of colorectal cancer. Talk to your health care provider about this at age 66, when routine screening begins. Direct exam of the colon should be repeated every 5-10 years through age 62 years, unless early forms of precancerous polyps or small growths are found.  People who are at an increased risk for hepatitis B should be screened for this virus. You are considered at high risk for hepatitis B if:  You were born in a country where hepatitis B occurs often. Talk with your health care provider about which countries are considered high risk.  Your parents were born in a high-risk country and you have not received a shot to protect against hepatitis B (hepatitis B vaccine).  You have HIV or AIDS.  You use needles to inject street drugs.  You live with, or have sex with, someone who has hepatitis B.  You get hemodialysis treatment.  You take certain medicines for conditions like cancer, organ transplantation, and autoimmune conditions.  Hepatitis C blood testing is recommended for all people born from [birth year] through 1965 and any individual with known risks for hepatitis C.  Practice safe sex. Use condoms and avoid high-risk sexual practices to reduce the spread of sexually transmitted infections (STIs). STIs include gonorrhea, chlamydia, syphilis, trichomonas, herpes, HPV, and human immunodeficiency virus (HIV).  Herpes, HIV, and HPV are viral illnesses that have no cure. They can result in disability, cancer, and death.  You should be screened for sexually transmitted illnesses (STIs) including gonorrhea and chlamydia if:  You are sexually active and are younger than 24 years.  You are older than 24 years and your health care provider tells you that you are at risk for this type of infection.  Your sexual activity has changed since you were last screened and you are at an increased risk for chlamydia or gonorrhea. Ask your health care provider if you are at risk.  If you are at risk of being infected with HIV, it is recommended that you take  a prescription medicine daily to prevent HIV infection. This is called preexposure prophylaxis (PrEP). You are considered at risk if:  You are sexually active and do not regularly use condoms or know the HIV status of your partner(s).  You take drugs by injection.  You are sexually active with a partner who has HIV.  Talk with your health care provider about whether you are at high risk of being infected with HIV. If you choose to begin PrEP, you should first be tested for HIV. You should then be tested every 3 months for as long as you are taking PrEP.  Osteoporosis is a disease in which the bones lose minerals and strength with aging. This can result in serious bone fractures or breaks. The risk of osteoporosis can be identified using a bone density scan. Women ages 35 years and over and women at risk for fractures or osteoporosis should discuss screening with their health care providers. Ask your health care provider whether you should take a calcium supplement or vitamin D to reduce the rate of osteoporosis.  Menopause can be associated with physical symptoms and risks. Hormone replacement therapy is available to decrease symptoms and risks. You should talk to your health care provider about whether hormone replacement therapy is right for you.  Use  sunscreen. Apply sunscreen liberally and repeatedly throughout the day. You should seek shade when your shadow is shorter than you. Protect yourself by wearing long sleeves, pants, a wide-brimmed hat, and sunglasses year round, whenever you are outdoors.  Once a month, do a whole body skin exam, using a mirror to look at the skin on your back. Tell your health care provider of new moles, moles that have irregular borders, moles that are larger than a pencil eraser, or moles that have changed in shape or color.  Stay current with required vaccines (immunizations).  Influenza vaccine. All adults should be immunized every year.  Tetanus, diphtheria, and acellular pertussis (Td, Tdap) vaccine. Pregnant women should receive 1 dose of Tdap vaccine during each pregnancy. The dose should be obtained regardless of the length of time since the last dose. Immunization is preferred during the 27th-36th week of gestation. An adult who has not previously received Tdap or who does not know her vaccine status should receive 1 dose of Tdap. This initial dose should be followed by tetanus and diphtheria toxoids (Td) booster doses every 10 years. Adults with an unknown or incomplete history of completing a 3-dose immunization series with Td-containing vaccines should begin or complete a primary immunization series including a Tdap dose. Adults should receive a Td booster every 10 years.  Varicella vaccine. An adult without evidence of immunity to varicella should receive 2 doses or a second dose if she has previously received 1 dose. Pregnant females who do not have evidence of immunity should receive the first dose after pregnancy. This first dose should be obtained before leaving the health care facility. The second dose should be obtained 4-8 weeks after the first dose.  Human papillomavirus (HPV) vaccine. Females aged 13-26 years who have not received the vaccine previously should obtain the 3-dose series. The vaccine  is not recommended for use in pregnant females. However, pregnancy testing is not needed before receiving a dose. If a female is found to be pregnant after receiving a dose, no treatment is needed. In that case, the remaining doses should be delayed until after the pregnancy. Immunization is recommended for any person with an immunocompromised condition through the age  of 26 years if she did not get any or all doses earlier. During the 3-dose series, the second dose should be obtained 4-8 weeks after the first dose. The third dose should be obtained 24 weeks after the first dose and 16 weeks after the second dose.  Zoster vaccine. One dose is recommended for adults aged 74 years or older unless certain conditions are present.  Measles, mumps, and rubella (MMR) vaccine. Adults born before 7 generally are considered immune to measles and mumps. Adults born in 1 or later should have 1 or more doses of MMR vaccine unless there is a contraindication to the vaccine or there is laboratory evidence of immunity to each of the three diseases. A routine second dose of MMR vaccine should be obtained at least 28 days after the first dose for students attending postsecondary schools, health care workers, or international travelers. People who received inactivated measles vaccine or an unknown type of measles vaccine during 1963-1967 should receive 2 doses of MMR vaccine. People who received inactivated mumps vaccine or an unknown type of mumps vaccine before 1979 and are at high risk for mumps infection should consider immunization with 2 doses of MMR vaccine. For females of childbearing age, rubella immunity should be determined. If there is no evidence of immunity, females who are not pregnant should be vaccinated. If there is no evidence of immunity, females who are pregnant should delay immunization until after pregnancy. Unvaccinated health care workers born before [birth year] who lack laboratory evidence of measles,  mumps, or rubella immunity or laboratory confirmation of disease should consider measles and mumps immunization with 2 doses of MMR vaccine or rubella immunization with 1 dose of MMR vaccine.  Pneumococcal 13-valent conjugate (PCV13) vaccine. When indicated, a person who is uncertain of his immunization history and has no record of immunization should receive the PCV13 vaccine. All adults 58 years of age and older should receive this vaccine. An adult aged 5 years or older who has certain medical conditions and has not been previously immunized should receive 1 dose of PCV13 vaccine. This PCV13 should be followed with a dose of pneumococcal polysaccharide (PPSV23) vaccine. Adults who are at high risk for pneumococcal disease should obtain the PPSV23 vaccine at least 8 weeks after the dose of PCV13 vaccine. Adults older than 77 years of age who have normal immune system function should obtain the PPSV23 vaccine dose at least 1 year after the dose of PCV13 vaccine.  Pneumococcal polysaccharide (PPSV23) vaccine. When PCV13 is also indicated, PCV13 should be obtained first. All adults aged 41 years and older should be immunized. An adult younger than age 90 years who has certain medical conditions should be immunized. Any person who resides in a nursing home or long-term care facility should be immunized. An adult smoker should be immunized. People with an immunocompromised condition and certain other conditions should receive both PCV13 and PPSV23 vaccines. People with human immunodeficiency virus (HIV) infection should be immunized as soon as possible after diagnosis. Immunization during chemotherapy or radiation therapy should be avoided. Routine use of PPSV23 vaccine is not recommended for American Indians, [place] Natives, or people younger than 65 years unless there are medical conditions that require PPSV23 vaccine. When indicated, people who have unknown immunization and have no record of immunization should  receive PPSV23 vaccine. One-time revaccination 5 years after the first dose of PPSV23 is recommended for people aged 19-64 years who have chronic kidney failure, nephrotic syndrome, asplenia, or immunocompromised conditions. People who  received 1-2 doses of PPSV23 before age 60 years should receive another dose of PPSV23 vaccine at age 67 years or later if at least 5 years have passed since the previous dose. Doses of PPSV23 are not needed for people immunized with PPSV23 at or after age 57 years.  Meningococcal vaccine. Adults with asplenia or persistent complement component deficiencies should receive 2 doses of quadrivalent meningococcal conjugate (MenACWY-D) vaccine. The doses should be obtained at least 2 months apart. Microbiologists working with certain meningococcal bacteria, [employer] recruits, people at risk during an outbreak, and people who travel to or live in countries with a high rate of meningitis should be immunized. A first-year college student up through age 46 years who is living in a residence hall should receive a dose if she did not receive a dose on or after her 16th birthday. Adults who have certain high-risk conditions should receive one or more doses of vaccine.  Hepatitis A vaccine. Adults who wish to be protected from this disease, have certain high-risk conditions, work with hepatitis A-infected animals, work in hepatitis A research labs, or travel to or work in countries with a high rate of hepatitis A should be immunized. Adults who were previously unvaccinated and who anticipate close contact with an international adoptee during the first 60 days after arrival in the [place] from a country with a high rate of hepatitis A should be immunized.  Hepatitis B vaccine. Adults who wish to be protected from this disease, have certain high-risk conditions, may be exposed to blood or other infectious body fluids, are household contacts or sex partners of hepatitis B positive people,  are clients or workers in certain care facilities, or travel to or work in countries with a high rate of hepatitis B should be immunized.  Haemophilus influenzae type b (Hib) vaccine. A previously unvaccinated person with asplenia or sickle cell disease or having a scheduled splenectomy should receive 1 dose of Hib vaccine. Regardless of previous immunization, a recipient of a hematopoietic stem cell transplant should receive a 3-dose series 6-12 months after her successful transplant. Hib vaccine is not recommended for adults with HIV infection. Preventive Services / Frequency Ages 29 to 59 years  Blood pressure check.** / Every 3-5 years.  Lipid and cholesterol check.** / Every 5 years beginning at age 48.  Clinical breast exam.** / Every 3 years for women in their 73s and 64s.  BRCA-related cancer risk assessment.** / For women who have family members with a BRCA-related cancer (breast, ovarian, tubal, or peritoneal cancers).  Pap test.** / Every 2 years from ages 24 through 10. Every 3 years starting at age 63 through age 73 or 60 with a history of 3 consecutive normal Pap tests.  HPV screening.** / Every 3 years from ages 57 through ages 48 to 63 with a history of 3 consecutive normal Pap tests.  Hepatitis C blood test.** / For any individual with known risks for hepatitis C.  Skin self-exam. / Monthly.  Influenza vaccine. / Every year.  Tetanus, diphtheria, and acellular pertussis (Tdap, Td) vaccine.** / Consult your health care provider. Pregnant women should receive 1 dose of Tdap vaccine during each pregnancy. 1 dose of Td every 10 years.  Varicella vaccine.** / Consult your health care provider. Pregnant females who do not have evidence of immunity should receive the first dose after pregnancy.  HPV vaccine. / 3 doses over 6 months, if 56 and younger. The vaccine is not recommended for use in pregnant  females. However, pregnancy testing is not needed before receiving a  dose.  Measles, mumps, rubella (MMR) vaccine.** / You need at least 1 dose of MMR if you were born in 1957 or later. You may also need a 2nd dose. For females of childbearing age, rubella immunity should be determined. If there is no evidence of immunity, females who are not pregnant should be vaccinated. If there is no evidence of immunity, females who are pregnant should delay immunization until after pregnancy.  Pneumococcal 13-valent conjugate (PCV13) vaccine.** / Consult your health care provider.  Pneumococcal polysaccharide (PPSV23) vaccine.** / 1 to 2 doses if you smoke cigarettes or if you have certain conditions.  Meningococcal vaccine.** / 1 dose if you are age 41 to 77 years and a Market researcher living in a residence hall, or have one of several medical conditions, you need to get vaccinated against meningococcal disease. You may also need additional booster doses.  Hepatitis A vaccine.** / Consult your health care provider.  Hepatitis B vaccine.** / Consult your health care provider.  Haemophilus influenzae type b (Hib) vaccine.** / Consult your health care provider. Ages 68 to 51 years  Blood pressure check.** / Every year.  Lipid and cholesterol check.** / Every 5 years beginning at age 12 years.  Lung cancer screening. / Every year if you are aged 64-80 years and have a 30-pack-year history of smoking and currently smoke or have quit within the past 15 years. Yearly screening is stopped once you have quit smoking for at least 15 years or develop a health problem that would prevent you from having lung cancer treatment.  Clinical breast exam.** / Every year after age 93 years.  BRCA-related cancer risk assessment.** / For women who have family members with a BRCA-related cancer (breast, ovarian, tubal, or peritoneal cancers).  Mammogram.** / Every year beginning at age 75 years and continuing for as long as you are in good health. Consult with your health care  provider.  Pap test.** / Every 3 years starting at age 53 years through age 70 or 59 years with a history of 3 consecutive normal Pap tests.  HPV screening.** / Every 3 years from ages 59 years through ages 38 to 49 years with a history of 3 consecutive normal Pap tests.  Fecal occult blood test (FOBT) of stool. / Every year beginning at age 64 years and continuing until age 21 years. You may not need to do this test if you get a colonoscopy every 10 years.  Flexible sigmoidoscopy or colonoscopy.** / Every 5 years for a flexible sigmoidoscopy or every 10 years for a colonoscopy beginning at age 68 years and continuing until age 27 years.  Hepatitis C blood test.** / For all people born from [birth year] through 1965 and any individual with known risks for hepatitis C.  Skin self-exam. / Monthly.  Influenza vaccine. / Every year.  Tetanus, diphtheria, and acellular pertussis (Tdap/Td) vaccine.** / Consult your health care provider. Pregnant women should receive 1 dose of Tdap vaccine during each pregnancy. 1 dose of Td every 10 years.  Varicella vaccine.** / Consult your health care provider. Pregnant females who do not have evidence of immunity should receive the first dose after pregnancy.  Zoster vaccine.** / 1 dose for adults aged 79 years or older.  Measles, mumps, rubella (MMR) vaccine.** / You need at least 1 dose of MMR if you were born in 1957 or later. You may also need a second dose. For females  of childbearing age, rubella immunity should be determined. If there is no evidence of immunity, females who are not pregnant should be vaccinated. If there is no evidence of immunity, females who are pregnant should delay immunization until after pregnancy.  Pneumococcal 13-valent conjugate (PCV13) vaccine.** / Consult your health care provider.  Pneumococcal polysaccharide (PPSV23) vaccine.** / 1 to 2 doses if you smoke cigarettes or if you have certain conditions.  Meningococcal vaccine.** /  Consult your health care provider.  Hepatitis A vaccine.** / Consult your health care provider.  Hepatitis B vaccine.** / Consult your health care provider.  Haemophilus influenzae type b (Hib) vaccine.** / Consult your health care provider. Ages 66 years and over  Blood pressure check.** / Every year.  Lipid and cholesterol check.** / Every 5 years beginning at age 84 years.  Lung cancer screening. / Every year if you are aged 56-80 years and have a 30-pack-year history of smoking and currently smoke or have quit within the past 15 years. Yearly screening is stopped once you have quit smoking for at least 15 years or develop a health problem that would prevent you from having lung cancer treatment.  Clinical breast exam.** / Every year after age 41 years.  BRCA-related cancer risk assessment.** / For women who have family members with a BRCA-related cancer (breast, ovarian, tubal, or peritoneal cancers).  Mammogram.** / Every year beginning at age 85 years and continuing for as long as you are in good health. Consult with your health care provider.  Pap test.** / Every 3 years starting at age 47 years through age 96 or 74 years with 3 consecutive normal Pap tests. Testing can be stopped between 65 and 70 years with 3 consecutive normal Pap tests and no abnormal Pap or HPV tests in the past 10 years.  HPV screening.** / Every 3 years from ages 63 years through ages 23 or 46 years with a history of 3 consecutive normal Pap tests. Testing can be stopped between 65 and 70 years with 3 consecutive normal Pap tests and no abnormal Pap or HPV tests in the past 10 years.  Fecal occult blood test (FOBT) of stool. / Every year beginning at age 45 years and continuing until age 36 years. You may not need to do this test if you get a colonoscopy every 10 years.  Flexible sigmoidoscopy or colonoscopy.** / Every 5 years for a flexible sigmoidoscopy or every 10 years for a colonoscopy beginning at age  74 years and continuing until age 21 years.  Hepatitis C blood test.** / For all people born from [birth year] through 1967 and any individual with known risks for hepatitis C.  Osteoporosis screening.** / A one-time screening for women ages 75 years and over and women at risk for fractures or osteoporosis.  Skin self-exam. / Monthly.  Influenza vaccine. / Every year.  Tetanus, diphtheria, and acellular pertussis (Tdap/Td) vaccine.** / 1 dose of Td every 10 years.  Varicella vaccine.** / Consult your health care provider.  Zoster vaccine.** / 1 dose for adults aged 87 years or older.  Pneumococcal 13-valent conjugate (PCV13) vaccine.** / Consult your health care provider.  Pneumococcal polysaccharide (PPSV23) vaccine.** / 1 dose for all adults aged 65 years and older.  Meningococcal vaccine.** / Consult your health care provider.  Hepatitis A vaccine.** / Consult your health care provider.  Hepatitis B vaccine.** / Consult your health care provider.  Haemophilus influenzae type b (Hib) vaccine.** / Consult your health care provider. ** Family  history and personal history of risk and conditions may change your health care provider's recommendations.   This information is not intended to replace advice given to you by your health care provider. Make sure you discuss any questions you have with your health care provider.   Document Released: 04/27/2001 Document Revised: 03/22/2014 Document Reviewed: 07/27/2010 Elsevier Interactive Patient Education [employer].

## 2015-04-10 LAB — COMPREHENSIVE METABOLIC PANEL
ALT: 16 U/L (ref 0–35)
AST: 18 U/L (ref 0–37)
Albumin: 4.3 g/dL (ref 3.5–5.2)
Alkaline Phosphatase: 72 U/L (ref 39–117)
BUN: 25 mg/dL — ABNORMAL HIGH (ref 6–23)
CO2: 29 meq/L (ref 19–32)
Calcium: 10.1 mg/dL (ref 8.4–10.5)
Chloride: 105 mEq/L (ref 96–112)
Creatinine, Ser: 1.15 mg/dL (ref 0.40–1.20)
GFR: 48.7 mL/min — AB (ref >60.00)
GLUCOSE: 106 mg/dL — AB (ref 70–99)
POTASSIUM: 4.7 meq/L (ref 3.5–5.1)
Sodium: 142 mEq/L (ref 135–145)
Total Bilirubin: 0.5 mg/dL (ref 0.2–1.2)
Total Protein: 7.3 g/dL (ref 6.0–8.3)

## 2015-04-10 LAB — LIPID PANEL
Cholesterol: 204 mg/dL — ABNORMAL HIGH (ref 0–200)
HDL: 78.1 mg/dL (ref >39.00)
LDL Cholesterol: 107 mg/dL — ABNORMAL HIGH (ref 0–99)
NONHDL: 125.72
Total CHOL/HDL Ratio: 3
Triglycerides: 92 mg/dL (ref 0.0–149.0)
VLDL: 18.4 mg/dL (ref 0.0–40.0)

## 2015-04-10 LAB — URINALYSIS, ROUTINE W REFLEX MICROSCOPIC
BILIRUBIN URINE: NEGATIVE
Ketones, ur: NEGATIVE
LEUKOCYTES UA: NEGATIVE
Nitrite: NEGATIVE
Specific Gravity, Urine: 1.015 (ref 1.000–1.030)
TOTAL PROTEIN, URINE-UPE24: 30 — AB
UROBILINOGEN UA: 0.2 (ref 0.0–1.0)
Urine Glucose: NEGATIVE
pH: 6 (ref 5.0–8.0)

## 2015-04-10 LAB — TSH: TSH: 0.37 u[IU]/mL (ref 0.35–4.50)

## 2015-04-15 ENCOUNTER — Ambulatory Visit: Payer: Medicare Other | Admitting: Family Medicine

## 2015-04-18 ENCOUNTER — Other Ambulatory Visit: Payer: Self-pay | Admitting: Physician Assistant

## 2015-04-20 MED ORDER — VALSARTAN 160 MG PO TABS: ORAL_TABLET | ORAL | Status: DC

## 2015-04-20 NOTE — Progress Notes (Signed)
Subjective:    Rose [name] is a 75 y.o. female who presents for Medicare Annual/Subsequent preventive examination.  Preventive Screening-Counseling & Management  Tobacco History  Smoking status  . Former Smoker -- 1.00 packs/day for 25 years  . Types: Cigarettes  . Quit date: 03/16/1983  Smokeless tobacco  . Never Used     Problems Prior to Visit Hypertension -- Endorses taking medications as directed. Notes BP getting slightly low sometimes. Patient denies chest pain, palpitations, lightheadedness, dizziness, vision changes or frequent headaches.  BP Readings from Last 3 Encounters:  04/09/15 108/62  03/04/15 96/58  01/28/15 102/48   Hypothyroidism -- Endorses taking levothyroxine as directed. Is due for repeat labs.  ADHD -- Has tried numerous medications previously. Are avoiding stimulants due to age and BMI as patient is underweight and stimulants may worsen due to appetite suppression. Was recently on Strattera and Clonidine without major improvement in symptoms. Has stopped the [facility] as she felt it was not beneficial. Would like to attempt Wellbutrin for symptoms.   Current Problems (verified) Patient Active Problem List   Diagnosis Date Noted  . Tensor fascia lata syndrome 03/04/2015  . Leg length discrepancy 10/29/2014  . Fecal incontinence 10/21/2014  . Muscle atrophy 06/18/2014  . Arthritis of left shoulder region 06/18/2014  . ADD (attention deficit disorder) 09/25/2013  . Encounter for Medicare annual wellness exam 08/02/2013  . Screening for osteoporosis 08/02/2013  . Depression 07/19/2013  . Hypothyroidism 07/19/2013  . Lung mass 07/19/2013  . COPD (chronic obstructive pulmonary disease) ([place]) 07/19/2013  . OTHER TENOSYNOVITIS OF HAND AND WRIST 11/26/2009  . Hereditary and idiopathic peripheral neuropathy 11/25/2009  . CAROTID ARTERY DISEASE 09/21/2008  . ALCOHOL ABUSE, HX OF 09/21/2008  . SUBCLAVIAN STEAL SYNDROME 09/20/2008  . Pain due to  total hip replacement ([place]) 01/23/2008  . RENAL ARTERY STENOSIS 10/11/2007  . Malignant neoplasm of skin of parts of face 02/21/2007  . MORTON'S NEUROMA, LEFT 02/21/2007  . BREAST CYST 02/21/2007  . CAROTID BRUIT, LEFT 02/21/2007  . DUODENAL ULCER, HX OF 02/21/2007  . Other acquired absence of organ 02/21/2007  . CARCINOMA, BASAL CELL, FACE 02/21/2007  . Essential hypertension 08/09/2006  . GERD 08/09/2006  . HEART MURMUR, BENIGN 08/09/2006    Medications Prior to Visit Current Outpatient Prescriptions on File Prior to Visit  Medication Sig Dispense Refill  . aspirin 81 MG tablet Take 81 mg by mouth daily.     . cloNIDine (CATAPRES) 0.1 MG tablet TAKE 1 TABLET BY MOUTH TWICE DAILY 60 tablet 2  . DULoxetine (CYMBALTA) 60 MG capsule TAKE 1 CAPSULE (60 MG TOTAL) BY MOUTH DAILY. 30 capsule 2  . ferrous sulfate dried (SLOW FE) 160 (50 FE) MG TBCR Take 160 mg by mouth 2 (two) times daily.     Marland Kitchen gabapentin (NEURONTIN) 600 MG tablet TAKE 1 TABLET (600 MG TOTAL) BY MOUTH 3 (THREE) TIMES DAILY. 90 tablet 3  . Glucosamine 500 MG TABS Take 1 tablet by mouth 2 (two) times daily. Glucosamine-Chondronton    . L-Methylfolate 15 MG TABS TAKE ONE (1) TABLET EACH DAY 30 tablet 3  . valsartan (DIOVAN) 160 MG tablet Take 1.5 tablets by mouth daily. 135 tablet 1  . VESICARE 10 MG tablet TAKE 1 TABLET BY MOUTH DAILY 30 tablet 5   No current facility-administered medications on file prior to visit.    Current Medications (verified) Current Outpatient Prescriptions  Medication Sig Dispense Refill  . aspirin 81 MG tablet Take 81 mg by mouth  daily.     . cloNIDine (CATAPRES) 0.1 MG tablet TAKE 1 TABLET BY MOUTH TWICE DAILY 60 tablet 2  . DULoxetine (CYMBALTA) 60 MG capsule TAKE 1 CAPSULE (60 MG TOTAL) BY MOUTH DAILY. 30 capsule 2  . ferrous sulfate dried (SLOW FE) 160 (50 FE) MG TBCR Take 160 mg by mouth 2 (two) times daily.     Marland Kitchen gabapentin (NEURONTIN) 600 MG tablet TAKE 1 TABLET (600 MG TOTAL) BY MOUTH 3  (THREE) TIMES DAILY. 90 tablet 3  . Glucosamine 500 MG TABS Take 1 tablet by mouth 2 (two) times daily. Glucosamine-Chondronton    . L-Methylfolate 15 MG TABS TAKE ONE (1) TABLET EACH DAY 30 tablet 3  . valsartan (DIOVAN) 160 MG tablet Take 1.5 tablets by mouth daily. 135 tablet 1  . VESICARE 10 MG tablet TAKE 1 TABLET BY MOUTH DAILY 30 tablet 5  . buPROPion (WELLBUTRIN SR) 100 MG 12 hr tablet Take 1 tablet (100 mg total) by mouth 2 (two) times daily. 60 tablet 0  . levothyroxine (SYNTHROID, LEVOTHROID) 112 MCG tablet TAKE 1 TABLET (112 MCG TOTAL) BY MOUTH DAILY BEFORE BREAKFAST. 30 tablet 5   No current facility-administered medications for this visit.     Allergies (verified) Amoxicillin; Codeine; Hydrocodone-acetaminophen; Hydromorphone hcl; Morphine and related; Morphine sulfate; and Nsaids   PAST HISTORY  Family History Family History  Problem Relation Age of Onset  . Adopted: Yes  . Schizophrenia Son     Deceased 77  . Hypertension Daughter     Renal  . Healthy Son     x1  . Healthy Daughter     x2    Social History Social History  Substance Use Topics  . Smoking status: Former Smoker -- 1.00 packs/day for 25 years    Types: Cigarettes    Quit date: 03/16/1983  . Smokeless tobacco: Never Used  . Alcohol Use: Yes     Comment: occ-hx alcohol abuse     Are there smokers in your home (other than you)? No  Risk Factors Current exercise habits: Home exercise routine includes walking 1 hrs per day.  Dietary issues discussed: Body mass index is 21.11 kg/(m^2).   Cardiac risk factors: advanced age (older than 68 for men, 57 for women) and dyslipidemia.  Depression Screen (Note: if answer to either of the following is "Yes", a more complete depression screening is indicated)   Over the past two weeks, have you felt down, depressed or hopeless? No  Over the past two weeks, have you felt little interest or pleasure in doing things? No  Have you lost interest or  pleasure in daily life? No  Do you often feel hopeless? No  Do you cry easily over simple problems? No  Activities of Daily Living In your present state of health, do you have any difficulty performing the following activities?:  Driving? No Managing money?  No Feeding yourself? No Getting from bed to chair? No Climbing a flight of stairs? No Preparing food and eating?: No Bathing or showering? No Getting dressed: No Getting to the toilet? No Using the toilet:No Moving around from place to place: No In the past year have you fallen or had a near fall?:No   Are you sexually active?  No  Do you have more than one partner?  N/A  Hearing Difficulties: No Do you often ask people to speak up or repeat themselves? No Do you experience ringing or noises in your ears? No Do you have difficulty understanding  soft or whispered voices? No   Do you feel that you have a problem with memory? No  Do you often misplace items? No  Do you feel safe at home?  Yes  Cognitive Testing  Alert? Yes  Normal Appearance?Yes  Oriented to person? Yes  Place? Yes   Time? Yes  Recall of three objects?  Yes  Can perform simple calculations? Yes  Displays appropriate judgment?Yes   Advanced Directives have been discussed with the patient? Yes  List the Names of Other Physician/Practitioners you currently use: 1.  See EMR for comprehensive list  Indicate any recent Medical Services you may have received from other than [facility] providers in the past year (date may be approximate).  Immunization History  Administered Date(s) Administered  . PPD Test 10/03/2013, 05/14/2014, 05/17/2014  . Pneumococcal Polysaccharide-23 11/29/2007  . Td 03/16/1999, 11/25/2009    Screening Tests Health Maintenance  Topic Date Due  . ZOSTAVAX  10/24/1998  . DEXA SCAN  10/24/2003  . PNA vac Low Risk Adult (2 of 2 - PCV13) 11/28/2008  . DTaP/Tdap/Td (1 - Tdap) 11/26/2009  . INFLUENZA VACCINE  10/14/2014  . TETANUS/TDAP   11/26/2019   All answers were reviewed with the patient and necessary referrals were made:  Leeanne Rio, PA-C   04/20/2015   History reviewed: allergies, current medications, past family history, past medical history, past social history, past surgical history and problem list  Review of Systems Pertinent items noted in HPI and remainder of comprehensive ROS otherwise negative.    Objective:      Body mass index is 21.11 kg/(m^2). BP 108/62 mmHg  Pulse 104  Temp(Src) 98.7 F (37.1 C) (Oral)  Ht 5\' 3"  (1.6 m)  Wt 119 lb 2 oz (54.035 kg)  BMI 21.11 kg/m2  SpO2 96%  General appearance: alert, cooperative, appears stated age and no distress Head: Normocephalic, without obvious abnormality, atraumatic Eyes: conjunctivae/corneas clear. PERRL, EOM's intact. Fundi benign. Ears: normal TM's and external ear canals both ears Nose: Nares normal. Septum midline. Mucosa normal. No drainage or sinus tenderness. Throat: lips, mucosa, and tongue normal; teeth and gums normal Lungs: clear to auscultation bilaterally Heart: regular rate and rhythm, S1, S2 normal, no murmur, click, rub or gallop Skin: Skin color, texture, turgor normal. No rashes or lesions Lymph nodes: Cervical, supraclavicular, and axillary nodes normal. Neurologic: Alert and oriented X 3, normal strength and tone. Normal symmetric reflexes. Normal coordination and gait     Assessment:     (1) Medicare Wellness, Subsequent (2) Hypertension  (3) Hypothyroidism (4) ADHD     Plan:     (1) During the course of the visit the patient was educated and counseled about appropriate screening and preventive services including:    Screening mammography  Bone densitometry screening - Bone Density ordered  Nutrition counseling   Advanced directives: has an advanced directive - a copy HAS NOT been provided.  Will obtain fasting labs today.  (2) Will continue amlodipine 2.5 mg daily. Will decrease Diovan to 1  tablet daily. Increase fluids. Follow-up 2 weeks. Will work on weaning off of clonidine next.  (3) Will obtain repeat TSH today.  (4) Will start Wellbutrin at 100 mg SR as directed. Continue other medications as directed.  Patient Instructions (the written plan) was given to the patient.  Medicare Attestation I have personally reviewed: The patient's medical and social history Their use of alcohol, tobacco or illicit drugs Their current medications and supplements The patient's functional ability including  ADLs,fall risks, home safety risks, cognitive, and hearing and visual impairment Diet and physical activities Evidence for depression or mood disorders  The patient's weight, height, BMI, and visual acuity have been recorded in the chart.  I have made referrals, counseling, and provided education to the patient based on review of the above and I have provided the patient with a written personalized care plan for preventive services.     Raiford Noble [place], [place]   04/20/2015

## 2015-04-21 ENCOUNTER — Other Ambulatory Visit (INDEPENDENT_AMBULATORY_CARE_PROVIDER_SITE_OTHER): Payer: Medicare Other

## 2015-04-21 DIAGNOSIS — R809 Proteinuria, unspecified: Secondary | ICD-10-CM | POA: Diagnosis not present

## 2015-04-21 LAB — URINALYSIS, ROUTINE W REFLEX MICROSCOPIC
Bilirubin Urine: NEGATIVE
Ketones, ur: NEGATIVE
Leukocytes, UA: NEGATIVE
Nitrite: NEGATIVE
SPECIFIC GRAVITY, URINE: 1.025 (ref 1.000–1.030)
TOTAL PROTEIN, URINE-UPE24: NEGATIVE
URINE GLUCOSE: NEGATIVE
UROBILINOGEN UA: 0.2 (ref 0.0–1.0)
pH: 6 (ref 5.0–8.0)

## 2015-04-22 ENCOUNTER — Ambulatory Visit (INDEPENDENT_AMBULATORY_CARE_PROVIDER_SITE_OTHER): Payer: Medicare Other | Admitting: Family Medicine

## 2015-04-22 ENCOUNTER — Encounter: Payer: Self-pay | Admitting: Family Medicine

## 2015-04-22 VITALS — BP 112/60 | HR 69 | Ht 63.0 in | Wt 122.0 lb

## 2015-04-22 DIAGNOSIS — T8484XD Pain due to internal orthopedic prosthetic devices, implants and grafts, subsequent encounter: Secondary | ICD-10-CM

## 2015-04-22 DIAGNOSIS — Z96649 Presence of unspecified artificial hip joint: Secondary | ICD-10-CM

## 2015-04-22 NOTE — Assessment & Plan Note (Signed)
Seem stable at this time. Encourage patient to continue home exercises. Patient has topical anti-inflammatories as needed. Iron supplementation at think will continue to be helping and we did discuss vitamin C supplementation with it. Encourage glucosamine since she does not have any significant arthritic changes on the contralateral side. Patient will continue to stay active. If any worsening symptoms she will come back and see me again for further evaluation. Multiple questions answered today.  Spent  25 minutes with patient face-to-face and had greater than 50% of counseling including as described above in assessment and plan.

## 2015-04-22 NOTE — Progress Notes (Signed)
Pre visit review using our clinic review tool, if applicable. No additional management support is needed unless otherwise documented below in the visit note. 

## 2015-04-22 NOTE — Patient Instructions (Signed)
Good to see you  Ice is your friend when needed Tylenol 500mg  3 times daily can help with arthritis pain.   Tart cherry extract at night OCntinue the vitamin D Arnica lotion to the bruise can help Stay active and give yourself a pat on the back you are doing great! See me again  When you need me.

## 2015-04-22 NOTE — Progress Notes (Signed)
[name] [address] [place], [place] [address] Phone: [phone] Subjective:     Chief complaint: Left hip pain follow-up  [id number] Rose [name] is a 75 y.o. female coming in with complaint of left hip pain. Reviewing patient's chart patient has had a history of moderate osteophytic changes of the left hip and had injections in 2009. Patient did have hip replacement. Patient was seen previously and we did get a CT scan of the hip. No significant loosening noted. Last time we saw him patient she did have more of a tensor fascia lata syndrome and patient was given an injection. An states that it was completely pain-free for approximately 3 weeks. Started coming back slowly. Having some mild discomfort but nothing has considerable as what it was previously. Patient is also having some problems with her right hip pain. Seems to be more when she is in a flexed position for a long amount of time. Sometimes has weakness in the hips bilaterally. Does have a past medical history significant for a fusion in her back. Patient states she has gotten a puppy and being more active. This is cost her to have some more aches and pains from time to time but she states overall she think she is doing well.      Continuing the iron supplementation which has given her significant more energy with no side effects.  Past medical history, social, surgical and family history all reviewed in electronic medical record.  Past Medical History  Diagnosis Date  . Other tenosynovitis of hand and wrist   . Peripheral neuropathy ([place])   . Personal history of alcoholism ([place])   . DJD (degenerative joint disease) of hip   . Myalgia and myositis, unspecified   . History of depression   . Morton's neuroma     Hx of, Left  . Carotid bruit     Left  . History of spinal fusion   . Personal history of peptic ulcer disease   . Solitary cyst of breast   . Basal cell carcinoma of face    . Attention deficit disorder without mention of hyperactivity   . Unspecified hypothyroidism   . Benign heart murmur   . Esophageal reflux   . Hypertension   . Pulmonary nodule   . Chronic bronchitis ([place])   . Wears glasses   . Abnormal blood pressure     left arm from old brachial artery repair  . Subclavian steal syndrome   . Carotid artery disease ([place])   . Atherosclerosis of renal artery ([place])   . Centrilobular emphysema ([place])   . Depression   . Anxiety   . Anemia    Past Surgical History  Procedure Laterality Date  . Appendectomy    . Tonsillectomy and adenoidectomy    . Left brachial artery repair of pseudoaneurysm  2001    post a cath  . Percutanous transluminal angioplasty of renal arteris    . Lumbar fusion  09/2004    T12-L5 (Dr. Patrice Paradise)  . Cervical fusion  2007    Dr. Valli Glance approach  . Total hip arthroplasty  2008    left  . Shoulder arthroscopy  09/2008    Left  . Orif tibia fracture  2010    Left distal  . Wisdom tooth extraction    . Carpometacarpel suspension plasty Right 08/07/2013    Procedure: CARPOMETACARPEL [facility]) SUSPENSION PLASTY RIGHT THUMB;  Surgeon: Wynonia Sours, MD;  Location: [facility];  Service: Orthopedics;  Laterality: Right;  . Minor irrigation and debridement of wound Right 08/27/2014    Procedure: MINOR IRRIGATION AND DEBRIDEMENT OF WOUND;  Surgeon: Daryll Brod, MD;  Location: [facility];  Service: Orthopedics;  Laterality: Right;  . Joint replacement Left   . Repair extensor tendon Right 09/26/2014    Procedure: REPAIR EXTENSOR TENDON RIGHT RING FINGER ;  Surgeon: Daryll Brod, MD;  Location: [facility];  Service: Orthopedics;  Laterality: Right;    Social History  Substance Use Topics  . Smoking status: Former Smoker -- 1.00 packs/day for 25 years    Types: Cigarettes    Quit date: 03/16/1983  . Smokeless tobacco: Never Used  . Alcohol Use: Yes     Comment: occ-hx alcohol abuse    The current method of family planning is none. Allergies  Allergen Reactions  . Amoxicillin     REACTION: unspecified  . Codeine   . Hydrocodone-Acetaminophen     REACTION: itching  . Hydromorphone Hcl   . Morphine And Related Hives and Itching  . Morphine Sulfate     REACTION: unspecified  . Nsaids     Pt can't remember what reaction she had   Review of Systems: No headache, visual changes, nausea, vomiting, diarrhea, constipation, dizziness, abdominal pain, skin rash, fevers, chills, night sweats, weight loss, swollen lymph nodes, body aches, joint swelling, muscle aches, chest pain, shortness of breath, mood changes.   Objective Blood pressure 112/60, pulse 69, height 5\' 3"  (1.6 m), weight 122 lb (55.339 kg), SpO2 92 %.  General: No apparent distress alert and oriented x3 mood and affect normal, dressed appropriately. Under weight HEENT: Pupils equal, extraocular movements intact  Respiratory: Patient's speak in full sentences and does not appear short of breath  Cardiovascular: No lower extremity edema, non tender, no erythema  Skin: Warm dry intact with no signs of infection or rash on extremities or on axial skeleton.  Abdomen: Soft nontender  Neuro: Cranial nerves II through XII are intact, neurovascularly intact in all extremities with 2+ DTRs and 2+ pulses.  Lymph: No lymphadenopathy of posterior or anterior cervical chain or axillae bilaterally.  Gait antalgic gait but not using a cane MSK:  Non tender with full range of motion and good stability and symmetric strength and tone of , elbows, wrist,  and ankles bilaterally. Osteophytic changes of multiple joints.  Left hip exam shows the patient's incision is well-healed. No signs of infection. Patient though does have good range of motio and no tenderness on exam. Right hip shows full range of motion with no pain in the groin. Full strength. Neurovascular intact distally bilaterally. Negative straight leg test  bilaterally.     Impression and Recommendations:     This case required medical decision making of moderate complexity.

## 2015-05-05 ENCOUNTER — Other Ambulatory Visit: Payer: Self-pay | Admitting: Physician Assistant

## 2015-05-12 ENCOUNTER — Telehealth: Payer: Self-pay | Admitting: Physician Assistant

## 2015-05-12 NOTE — Telephone Encounter (Signed)
Relation to [id number] Call back [phone]   Reason for call:  Patient would like to discuss STRATTERA 100 MG capsule

## 2015-05-12 NOTE — Telephone Encounter (Signed)
Called patient who states this office needs to call her Insurance company to tell them that the Strattera  100 mg prescribed is a Tier 4 medication and she needed a Tier exception. States she cannot take stimulant medications because they raise her BP. Called pharmacy to request they send PA. Ran medication while I waited. Pharmacist states medication went through without a PA. States medication will only cost 100 something with insurance instead of 400.

## 2015-05-21 ENCOUNTER — Telehealth: Payer: Self-pay | Admitting: Physician Assistant

## 2015-05-21 NOTE — Telephone Encounter (Signed)
Called and spoke with the pt and informed her that we checked her thyroid level on (04/09/15) and it was normal.  Pt stated that she forgot.  She said that she had been feeling tired so she thought it may be coming from her thyroid.   Informed the pt again that her thyroid level was normal.  She verbalized understanding.//AB/CMA

## 2015-05-21 NOTE — Telephone Encounter (Signed)
Caller name:Self  Can be reached: [phone]   Reason for call: Request to have labs drawn to check her Thyroid. States she has not had it checked in a few years

## 2015-05-24 ENCOUNTER — Other Ambulatory Visit: Payer: Self-pay | Admitting: Physician Assistant

## 2015-06-03 ENCOUNTER — Other Ambulatory Visit: Payer: Self-pay | Admitting: Physician Assistant

## 2015-06-03 NOTE — Telephone Encounter (Signed)
Last OV 01/28/15 wellbutrin last filled 05/05/15 #60 with 0

## 2015-06-11 ENCOUNTER — Other Ambulatory Visit: Payer: Self-pay | Admitting: Physician Assistant

## 2015-06-11 NOTE — Telephone Encounter (Signed)
Rx sent to the pharmacy by e-script.//AB/CMA 

## 2015-06-12 DIAGNOSIS — L821 Other seborrheic keratosis: Secondary | ICD-10-CM | POA: Diagnosis not present

## 2015-06-12 DIAGNOSIS — Z08 Encounter for follow-up examination after completed treatment for malignant neoplasm: Secondary | ICD-10-CM | POA: Diagnosis not present

## 2015-06-12 DIAGNOSIS — Z85828 Personal history of other malignant neoplasm of skin: Secondary | ICD-10-CM | POA: Diagnosis not present

## 2015-06-23 ENCOUNTER — Other Ambulatory Visit: Payer: Self-pay | Admitting: Physician Assistant

## 2015-06-29 ENCOUNTER — Other Ambulatory Visit: Payer: Self-pay | Admitting: Physician Assistant

## 2015-07-05 ENCOUNTER — Other Ambulatory Visit: Payer: Self-pay | Admitting: Physician Assistant

## 2015-07-24 ENCOUNTER — Other Ambulatory Visit: Payer: Self-pay | Admitting: Physician Assistant

## 2015-07-31 ENCOUNTER — Other Ambulatory Visit: Payer: Self-pay | Admitting: Physician Assistant

## 2015-07-31 MED ORDER — GABAPENTIN 600 MG PO TABS: ORAL_TABLET | ORAL | Status: DC

## 2015-08-04 ENCOUNTER — Telehealth: Payer: Self-pay | Admitting: Physician Assistant

## 2015-08-04 DIAGNOSIS — I10 Essential (primary) hypertension: Secondary | ICD-10-CM

## 2015-08-04 NOTE — Telephone Encounter (Signed)
Can be reached: [phone] Pharmacy: [facility], [address]  Reason for call: Pt states that pharmacy called [place] and were told that she does not need amlodipine and denied refill. Pt states that she does take it every night and is out. She needs it for tonight. Pt requesting call.

## 2015-08-05 MED ORDER — AMLODIPINE BESYLATE 5 MG PO TABS: 5.0000 mg | ORAL_TABLET | Freq: Every day | ORAL | Status: DC

## 2015-08-05 NOTE — Telephone Encounter (Signed)
Called and spoke with the pt and informed her of the note below.  Pt verbalized understanding and agreed.  New prescription sent to the pharmacy by e-script.  Pt stated that she will call back to schedule a follow-up appt.//AB/CMA

## 2015-08-05 NOTE — Telephone Encounter (Signed)
Called and [facility] @ 9:56am @ [phone]) asking the pt to RTC regarding the note below.//AB/CMA

## 2015-08-05 NOTE — Telephone Encounter (Signed)
She should be taking 2.5 mg daily but has been taking 5 regularly with normal BP at home. Ok to send in Rx for amlodipine 5 mg but she needs follow-up scheduled before further refills will be given.

## 2015-08-05 NOTE — Telephone Encounter (Signed)
Pt returned call.   CB: [phone]

## 2015-08-12 ENCOUNTER — Other Ambulatory Visit: Payer: Self-pay | Admitting: Physician Assistant

## 2015-08-12 NOTE — Telephone Encounter (Signed)
Rx request to pharmacy/SLS Requested drug refills are authorized, however, the patient needs further evaluation and/or laboratory testing before further refills are given. Ask her to make an appointment for this.  Please call patient and schedule F/U visit prior to future refill authorizations per provider/SLS Thanks.

## 2015-08-13 NOTE — Telephone Encounter (Signed)
LM for pt to call and schedule f/u appt with Einar Pheasant was due in Feb.

## 2015-08-19 ENCOUNTER — Encounter: Payer: Self-pay | Admitting: Physician Assistant

## 2015-08-19 ENCOUNTER — Ambulatory Visit (INDEPENDENT_AMBULATORY_CARE_PROVIDER_SITE_OTHER): Payer: Medicare Other | Admitting: Physician Assistant

## 2015-08-19 VITALS — BP 116/63 | HR 76 | Temp 97.5°F | Resp 16 | Ht 63.0 in | Wt 120.5 lb

## 2015-08-19 DIAGNOSIS — I10 Essential (primary) hypertension: Secondary | ICD-10-CM

## 2015-08-19 DIAGNOSIS — D509 Iron deficiency anemia, unspecified: Secondary | ICD-10-CM

## 2015-08-19 MED ORDER — AMLODIPINE BESYLATE 5 MG PO TABS: 10.0000 mg | ORAL_TABLET | Freq: Every day | ORAL | Status: DC

## 2015-08-19 MED ORDER — VALSARTAN 160 MG PO TABS: ORAL_TABLET | ORAL | Status: DC

## 2015-08-19 MED ORDER — BUPROPION HCL ER (SR) 100 MG PO TB12: 100.0000 mg | ORAL_TABLET | Freq: Two times a day (BID) | ORAL | Status: DC

## 2015-08-19 NOTE — Patient Instructions (Signed)
Please take medications as directed by your medication list.  Go to the lab for blood work.  I will call you with your results.  You will be contacted by Urology and Gastroenterology.  Follow-up with me in 3 months.

## 2015-08-19 NOTE — Progress Notes (Signed)
Patient presents to clinic today for follow-up of hypertension. Patient recently requested refill but there was question over dosing as she has not been taking as directed. She is currently prescribed amlodipine 5 mg daily. Has been taking 10 mg daily as directed. Is also prescribed Diovan 160 mg daily. Has been taking 1 tablet daily on some days and 1.5 daily on others. Patient denies chest pain, palpitations, lightheadedness, dizziness, vision changes or frequent headaches.  BP Readings from Last 3 Encounters:  08/19/15 116/63  04/22/15 112/60  04/09/15 108/62   Past Medical History  Diagnosis Date  . Other tenosynovitis of hand and wrist   . Peripheral neuropathy ([place])   . Personal history of alcoholism ([place])   . DJD (degenerative joint disease) of hip   . Myalgia and myositis, unspecified   . History of depression   . Morton's neuroma     Hx of, Left  . Carotid bruit     Left  . History of spinal fusion   . Personal history of peptic ulcer disease   . Solitary cyst of breast   . Basal cell carcinoma of face   . Attention deficit disorder without mention of hyperactivity   . Unspecified hypothyroidism   . Benign heart murmur   . Esophageal reflux   . Hypertension   . Pulmonary nodule   . Chronic bronchitis ([place])   . Wears glasses   . Abnormal blood pressure     left arm from old brachial artery repair  . Subclavian steal syndrome   . Carotid artery disease ([place])   . Atherosclerosis of renal artery ([place])   . Centrilobular emphysema ([name])   . Depression   . Anxiety   . Anemia     Current Outpatient Prescriptions on File Prior to Visit  Medication Sig Dispense Refill  . amLODipine (NORVASC) 5 MG tablet Take 1 tablet (5 mg total) by mouth daily. (Patient taking differently: Take 5 mg by mouth daily. Patient takes 1.5 to 2 Tablets Daily.) 90 tablet 0  . aspirin 81 MG tablet Take 81 mg by mouth daily.     Marland Kitchen buPROPion (WELLBUTRIN SR) 100 MG 12 hr tablet TAKE ONE TABLET  BY MOUTH TWICE A DAY (Patient taking differently: TAKE ONE TABLET BY MOUTH TWICE A DAY-PATIENT REPORTS ONLY TAKING ONCE DAILY) 60 tablet 0  . cloNIDine (CATAPRES) 0.1 MG tablet TAKE 1 TABLET BY MOUTH TWICE DAILY 60 tablet 2  . DULoxetine (CYMBALTA) 60 MG capsule TAKE ONE CAPSULE BY MOUTH DAILY 30 capsule 0  . ferrous sulfate dried (SLOW FE) 160 (50 FE) MG TBCR Take 160 mg by mouth 2 (two) times daily. Patient has been taking Once Daily    . gabapentin (NEURONTIN) 600 MG tablet TAKE ONE TABLET BY MOUTH 3 (THREE) TIMES DAILY 90 tablet 0  . Glucosamine 500 MG TABS Take 1 tablet by mouth 2 (two) times daily. Glucosamine-Chondronton    . L-Methylfolate 15 MG TABS TAKE ONE (1) TABLET EACH DAY (Patient taking differently: TAKE ONE (1) TABLET EACH DAY-PATIENT REPORTS DES NOT HAVE SCRIPT FOR RX) 30 tablet 3  . levothyroxine (SYNTHROID, LEVOTHROID) 112 MCG tablet TAKE 1 TABLET (112 MCG TOTAL) BY MOUTH DAILY BEFORE BREAKFAST. 30 tablet 5  . valsartan (DIOVAN) 160 MG tablet Take 1 tablets by mouth daily. (Patient taking differently: Take 1.5 tablets by mouth daily.) 135 tablet 1  . VESICARE 10 MG tablet TAKE 1 TABLET BY MOUTH DAILY 30 tablet 0   No current facility-administered  medications on file prior to visit.    Allergies  Allergen Reactions  . Amoxicillin     REACTION: Rash  . Codeine   . Hydrocodone-Acetaminophen     REACTION: itching  . Hydromorphone Hcl   . Morphine And Related Hives and Itching  . Morphine Sulfate     REACTION: unspecified  . Nsaids     Ulcer    Family History  Problem Relation Age of Onset  . Adopted: Yes  . Schizophrenia Son     Deceased 74  . Hypertension Daughter     Renal  . Healthy Son     x1  . Healthy Daughter     x2    Social History   Social History  . Marital Status: Single    Spouse Name: N/A  . Number of Children: Y  . Years of Education: N/A   Occupational History  . retired     Engineer, maintenance (IT), had own firm   Social History Main Topics  .  Smoking status: Former Smoker -- 1.00 packs/day for 25 years    Types: Cigarettes    Quit date: 03/16/1983  . Smokeless tobacco: Never Used  . Alcohol Use: Yes     Comment: occ-hx alcohol abuse  . Drug Use: No  . Sexual Activity: Not Asked   Other Topics Concern  . 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                  Review of Systems - See HPI.  All other ROS are negative.  BP 116/63 mmHg  Pulse 76  Temp(Src) 97.5 F (36.4 C) (Oral)  Resp 16  Ht 5\' 3"  (1.6 m)  Wt 120 lb 8 oz (54.658 kg)  BMI 21.35 kg/m2  SpO2 96%  Physical Exam  Constitutional: She is oriented to person, place, and time and well-developed, well-nourished, and in no distress.  HENT:  Head: Normocephalic and atraumatic.  Cardiovascular: Normal rate, regular rhythm and normal heart sounds.   Pulmonary/Chest: Effort normal and breath sounds normal.  Neurological: She is alert and oriented to person, place, and time.  Skin: Skin is warm and dry. No rash noted.  Psychiatric: Affect normal.  Vitals reviewed.  No results found for this or any previous visit (from the past 2160 hour(s)).  Assessment/Plan: 1. Essential hypertension, benign Reviewed appropriate regimen. She will make sure to take medications only as directed. DASH diet reviewed. FU scheduled. - amLODipine (NORVASC) 5 MG tablet; Take 2 tablets (10 mg total) by mouth daily.  Dispense: 90 tablet; Refill: 0 - valsartan (DIOVAN) 160 MG tablet; Take 1 tablets by mouth daily.  Dispense: 135 tablet; Refill: 1  2. Anemia, iron deficiency Remote history. Asymptomatic. Will check labs today. - CBC - Ferritin

## 2015-08-19 NOTE — Progress Notes (Signed)
Pre visit review using our clinic review tool, if applicable. No additional management support is needed unless otherwise documented below in the visit note/SLS  

## 2015-08-20 LAB — FERRITIN: Ferritin: 49.8 ng/mL (ref 10.0–291.0)

## 2015-08-20 LAB — CBC
HEMATOCRIT: 36.4 % (ref 36.0–46.0)
Hemoglobin: 12.3 g/dL (ref 12.0–15.0)
MCHC: 33.7 g/dL (ref 30.0–36.0)
MCV: 98.9 fl (ref 78.0–100.0)
PLATELETS: 252 10*3/uL (ref 150.0–400.0)
RBC: 3.68 Mil/uL — AB (ref 3.87–5.11)
RDW: 14.9 % (ref 11.5–15.5)
WBC: 8.3 10*3/uL (ref 4.0–10.5)

## 2015-08-25 ENCOUNTER — Other Ambulatory Visit: Payer: Self-pay | Admitting: Orthopedic Surgery

## 2015-08-25 DIAGNOSIS — M1812 Unilateral primary osteoarthritis of first carpometacarpal joint, left hand: Secondary | ICD-10-CM | POA: Diagnosis not present

## 2015-08-27 ENCOUNTER — Other Ambulatory Visit: Payer: Self-pay | Admitting: Physician Assistant

## 2015-08-27 NOTE — Telephone Encounter (Signed)
Rx's sent to the pharmacy by e-script.//AB/CMA 

## 2015-08-27 NOTE — Telephone Encounter (Signed)
Refill sent per [facility] refill protocol/SLS  

## 2015-08-28 ENCOUNTER — Other Ambulatory Visit: Payer: Self-pay | Admitting: Orthopedic Surgery

## 2015-09-02 ENCOUNTER — Other Ambulatory Visit: Payer: Self-pay | Admitting: Physician Assistant

## 2015-09-03 NOTE — Telephone Encounter (Signed)
Rx request Denied per provider; Medication no longer prescribed for patient, needs to contact provider/SlS 06/21

## 2015-09-06 ENCOUNTER — Other Ambulatory Visit: Payer: Self-pay | Admitting: Physician Assistant

## 2015-09-08 ENCOUNTER — Telehealth: Payer: Self-pay | Admitting: Physician Assistant

## 2015-09-08 DIAGNOSIS — F329 Major depressive disorder, single episode, unspecified: Secondary | ICD-10-CM

## 2015-09-08 DIAGNOSIS — F32A Depression, unspecified: Secondary | ICD-10-CM

## 2015-09-08 NOTE — Telephone Encounter (Signed)
Per chart review, valsartan was prescribed to make 1.5 tablets daily at one point, but was changed to 1 tablet daily in Feb. 2017. Pt reports she is currently still taking 1.5 tablets daily, and reports that Einar Pheasant did not discuss changing this to 1 tablet daily at her last OV. Explained to pt that the rx will be written for how it is prescribed, not how she takes it, and that per PCP she should be taking 1 tablet/160 mg daily. Also discussed w/ pt that per last OV note, her medication regimen was discussed including proper dosing for valsartan. She reports she has always taken 1.5 tablets, and that no one discussed any changes with her.  Pt states she checks her BP at home and if her BP is normal, she will take 1 tablet, but if it is abnormal, she will take 1.5 tablets. Currently her BP cuff is broken, but she is working on getting a new one.   She also states that the 160 mg are too big for her swallow comfortably, and that they will not fit in her pill cutter. She would like for rx for 80 mg tablets to be sent in so that she can take 2 smaller tablets, instead of the 1 large tablet.   Pt also inquired about her diazepam rx, which was denied last week. She reports she uses this medication very sparingly for muscle spasms, as directed, and she would like refill to be sent to pharmacy. She states she completely forgot to mention it to [place] at her last OV. Explained to pt that this is not on her current medication list and has not been filled in over a year, and that refills of controlled substances are at the discretion of the provider.   Pt is aware that PCP is out of the office this week.   Please advise diazepam refill and if okay to send valsartan 80 mg tablets w/ same sig as current rx.

## 2015-09-08 NOTE — Telephone Encounter (Signed)
Relation to [id number] Call back [place], [address]  Reason for call:  Patient would like to discuss valsartan (DIOVAN) 160 MG tablet and states PA change without her knowing and would like to discuss. Please advise

## 2015-09-08 NOTE — Telephone Encounter (Signed)
Provider is out of the office this week; patient tends to "self-medicate" with her medications when she feels she needs a change. Patient has been told numerous times that this is unacceptable. Patient has changed her dosage on Valsartan from [1] tablet daily, as prescribed by provider, to an increased dosage of [1.5] tablets daily; pt's BP has consistently been under the 120/80 range for the last [10] years, with the exception of a handful [id number] systolic spread out numbers [nothing consistent]. She will need to discuss this with provider when he returns to the office next Wed, 09/17/15, as provider was the prescriber on Sat, 09/06/15//SLS 06/26

## 2015-09-09 NOTE — Telephone Encounter (Signed)
Will defer to pcp upon return.

## 2015-09-10 ENCOUNTER — Other Ambulatory Visit: Payer: Self-pay | Admitting: Physician Assistant

## 2015-09-10 NOTE — Telephone Encounter (Signed)
Refill sent per [facility] refill protocol/SLS  

## 2015-09-10 NOTE — Telephone Encounter (Signed)
TOO SOON FOR RX REQUEST: Medication Detail      Disp Refills Start End     buPROPion (WELLBUTRIN SR) 100 MG 12 hr tablet 60 tablet 0 08/19/2015     Sig - Route: Take 1 tablet (100 mg total) by mouth 2 (two) times daily. - Oral    E-Prescribing Status: Receipt confirmed by pharmacy (08/19/2015 4:23 PM EDT)     [place], [place] [address]

## 2015-09-15 NOTE — Telephone Encounter (Signed)
This has been a constant struggle. Her appropriate dosing of BP medications has been reviewed several times, including her last appointment where I printed out the next most recent appt AVS and showed her where she was to make sure and only take 1 tablet of the Valsartan. The correct dose is 160 mg. If she prefers to take 2 80 mg tablets to be easier to swallow, I am fine with that. Ok to send in Quantity 60 with 1 refill. Also want her to FU in 3-4 weeks.

## 2015-09-17 ENCOUNTER — Telehealth: Payer: Self-pay | Admitting: *Deleted

## 2015-09-17 ENCOUNTER — Encounter (HOSPITAL_BASED_OUTPATIENT_CLINIC_OR_DEPARTMENT_OTHER): Payer: Self-pay | Admitting: *Deleted

## 2015-09-17 MED ORDER — VALSARTAN 80 MG PO TABS: 160.0000 mg | ORAL_TABLET | Freq: Every day | ORAL | Status: DC

## 2015-09-17 MED ORDER — DIAZEPAM 5 MG PO TABS: 5.0000 mg | ORAL_TABLET | Freq: Two times a day (BID) | ORAL | Status: DC | PRN

## 2015-09-17 NOTE — Telephone Encounter (Signed)
Valsartan filled to pharmacy as requested. Diazepam printed, to PCP for signature.

## 2015-09-17 NOTE — Telephone Encounter (Signed)
Called patient and informed her request for Diazepam has been approved by provider and faxed to [place]; pt has change of address, corrected in demographics/SLS 07/05

## 2015-09-17 NOTE — Telephone Encounter (Signed)
Please advise diazepam refill. Thanks.

## 2015-09-17 NOTE — Telephone Encounter (Signed)
Ok to refill at old dose but 10 tablets only since it is a rare use. Will discuss at follow-up before further refills will be given.

## 2015-09-17 NOTE — Telephone Encounter (Signed)
Rx faxed to pharmacy and pt notified by Mackey Birchwood, [place].

## 2015-09-18 ENCOUNTER — Encounter: Payer: Self-pay | Admitting: Medical

## 2015-09-18 ENCOUNTER — Ambulatory Visit (INDEPENDENT_AMBULATORY_CARE_PROVIDER_SITE_OTHER): Payer: Medicare Other | Admitting: Medical

## 2015-09-18 VITALS — BP 98/60 | HR 87 | Temp 98.1°F | Ht 63.0 in | Wt 121.8 lb

## 2015-09-18 DIAGNOSIS — I959 Hypotension, unspecified: Secondary | ICD-10-CM

## 2015-09-18 DIAGNOSIS — T148XXA Other injury of unspecified body region, initial encounter: Secondary | ICD-10-CM

## 2015-09-18 DIAGNOSIS — T148 Other injury of unspecified body region: Secondary | ICD-10-CM | POA: Diagnosis not present

## 2015-09-18 MED ORDER — MUPIROCIN 2 % EX OINT: TOPICAL_OINTMENT | CUTANEOUS | Status: DC

## 2015-09-18 NOTE — Addendum Note (Signed)
Addended by: Anabel Halon on: 09/18/2015 07:02 PM   Modules accepted: Orders

## 2015-09-18 NOTE — Patient Instructions (Addendum)
For your abrasion and laceration it will take some time as your wound is healing by secondary intention. It may take another week. We want to prevent infection. Will rx mupirocin twice daily. Advise use nonstick dressing over area during the day. At night leave open to air but avoid knocking off scab. Would recommend holding off for swimming another 4-5 days.   Regarding bp levels. Explained to he come in tomorrow am for nurse bp check. Bring in machine. Will get cbc and cmp tomorrow.  Hold amlodipine tonight if bp is same/like it is  Now.   Follow up in 7 days or as needed

## 2015-09-18 NOTE — Progress Notes (Addendum)
Subjective:    Patient ID: Rose [name], female    DOB: 1940/10/10, 75 y.o.   MRN: [id number]  HPI  Pt is in for evaluation of some abrasions. Pt she states history of rt hand skin infection after had a cut. This was about 3-4 months ago. Pt states she can't remember if any aggressive bacteria was found.  Pt states just recently states walking her [employer] dog and he pulled her to the ground. Abrased her elbow. No pain on rom. Pt up to date on tdap.   Review of Systems  Constitutional: Negative for fever, chills and fatigue.  Respiratory: Negative for cough, chest tightness, shortness of breath and wheezing.   Cardiovascular: Negative for chest pain and palpitations.  Genitourinary: Negative for dysuria.  Skin: Positive for rash.  Hematological: Negative for adenopathy. Does not bruise/bleed easily.     Past Medical History  Diagnosis Date  . Other tenosynovitis of hand and wrist   . Peripheral neuropathy ([facility])   . Personal history of alcoholism ([facility])   . DJD (degenerative joint disease) of hip   . Myalgia and myositis, unspecified   . History of depression   . Morton's neuroma     Hx of, Left  . Carotid bruit     Left  . History of spinal fusion   . Personal history of peptic ulcer disease   . Solitary cyst of breast   . Basal cell carcinoma of face   . Attention deficit disorder without mention of hyperactivity   . Unspecified hypothyroidism   . Benign heart murmur   . Esophageal reflux   . Hypertension   . Pulmonary nodule   . Chronic bronchitis (HCC)   . Wears glasses   . Abnormal blood pressure     left arm from old brachial artery repair  . Subclavian steal syndrome   . Carotid artery disease ([facility])   . Atherosclerosis of renal artery ([facility])   . Centrilobular emphysema ([facility])   . Depression   . Anxiety   . Anemia   . Rotator cuff disorder     pain     Social History   Social History  . Marital Status: Single    Spouse Name: N/A  . Number  of Children: Y  . Years of Education: N/A   Occupational History  . retired     IT trainer, had own firm   Social History Main Topics  . Smoking status: Former Smoker -- 1.00 packs/day for 25 years    Types: Cigarettes    Quit date: 03/16/1983  . Smokeless tobacco: Never Used  . Alcohol Use: Yes     Comment: 2-3 glasses wine daily  . Drug Use: No  . Sexual Activity: Not on file   Other Topics Concern  . 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                   Past Surgical History  Procedure Laterality Date  . Appendectomy    . Tonsillectomy and adenoidectomy    . Left brachial artery repair of pseudoaneurysm  2001    post a cath  . Percutanous  transluminal angioplasty of renal arteris    . Lumbar fusion  09/2004    T12-L5 (Dr. Noel Gerold)  . Cervical fusion  2007    Dr. Maylene Roes approach  . Total hip arthroplasty  2008    left  . Shoulder arthroscopy  09/2008    Left  . Orif tibia fracture  2010    Left distal  . Wisdom tooth extraction    . Carpometacarpel suspension plasty Right 08/07/2013    Procedure: CARPOMETACARPEL [facility]) SUSPENSION PLASTY RIGHT THUMB;  Surgeon: Nicki Reaper, MD;  Location: [facility];  Service: Orthopedics;  Laterality: Right;  . Minor irrigation and debridement of wound Right 08/27/2014    Procedure: MINOR IRRIGATION AND DEBRIDEMENT OF WOUND;  Surgeon: Cindee Salt, MD;  Location: [facility];  Service: Orthopedics;  Laterality: Right;  . Joint replacement Left   . Repair extensor tendon Right 09/26/2014    Procedure: REPAIR EXTENSOR TENDON RIGHT RING FINGER ;  Surgeon: Cindee Salt, MD;  Location: [facility];  Service: Orthopedics;  Laterality:  Right;    Family History  Problem Relation Age of Onset  . Adopted: Yes  . Schizophrenia Son     Deceased 39  . Hypertension Daughter     Renal  . Healthy Son     x1  . Healthy Daughter     x2    Allergies  Allergen Reactions  . Amoxicillin     REACTION: Rash  . Codeine   . Hydrocodone-Acetaminophen     REACTION: itching  . Hydromorphone Hcl   . Morphine And Related Hives and Itching  . Morphine Sulfate     REACTION: unspecified  . Nsaids     Ulcer    Current Outpatient Prescriptions on File Prior to Visit  Medication Sig Dispense Refill  . amLODipine (NORVASC) 5 MG tablet Take 2 tablets (10 mg total) by mouth daily. 90 tablet 0  . aspirin 81 MG tablet Take 81 mg by mouth daily.     Marland Kitchen buPROPion (WELLBUTRIN SR) 100 MG 12 hr tablet Take 1 tablet (100 mg total) by mouth 2 (two) times daily. 60 tablet 0  . cloNIDine (CATAPRES) 0.1 MG tablet TAKE 1 TABLET BY MOUTH TWICE DAILY 60 tablet 1  . diazepam (VALIUM) 5 MG tablet Take 1 tablet (5 mg total) by mouth every 12 (twelve) hours as needed for anxiety or muscle spasms (use sparingly). 10 tablet 0  . DULoxetine (CYMBALTA) 60 MG capsule TAKE ONE CAPSULE BY MOUTH DAILY 30 capsule 5  . ferrous sulfate dried (SLOW FE) 160 (50 FE) MG TBCR Take 160 mg by mouth 2 (two) times daily. Patient has been taking Once Daily    . gabapentin (NEURONTIN) 600 MG tablet Take 1 tablet (600 mg total) by mouth 3 (three) times daily. 90 tablet 5  . Glucosamine 500 MG TABS Take 1 tablet by mouth 2 (two) times daily. Glucosamine-Chondronton    . levothyroxine (SYNTHROID, LEVOTHROID) 112 MCG tablet TAKE 1 TABLET (112 MCG TOTAL) BY MOUTH DAILY BEFORE BREAKFAST. 30 tablet 5  . valsartan (DIOVAN) 80 MG tablet Take 2 tablets (160 mg total) by mouth daily. 60 tablet 1  . VESICARE 10 MG tablet TAKE 1 TABLET BY MOUTH DAILY 30 tablet 3   No current facility-administered medications on file prior to visit.    BP 98/60 mmHg  Pulse 87  Temp(Src) 98.1 F  (36.7 C) (Oral)  Ht 5\' 3"  (1.6 m)  Wt 121 lb 12.8  oz (55.248 kg)  BMI 21.58 kg/m2  SpO2 98%       Objective:   Physical Exam   General- No acute distress. Pleasant patient.  Lungs- Clear, even and unlabored. Heart- regular rate and rhythm. Neurologic- CNII- XII grossly intact.  Rt elbow- no 4cm abrasion that looked like it was deep. Small scab and healing be secondary intention. Looks like has filled in.  Rt elbow- good rom       Assessment & Plan:  For your abrasion and laceration it will take some time as your wound is healing by secondary intention. It may take another week. We want to prevent infection. Will rx mupirocin twice daily. Advise use nonstick dressing over area during the day. At night leave open to air but avoid knocking off scab. Would recommend holding off for swimming another 4-5 days.   Pt at end stated that her bp in morning her are running 150-140/80 last 2 weeks. Most of time her bp was 120/70 during rest of the day. Pt takes amlodipine and clonidine at night. Explained to he come in tomorrow am for nurse bp check. Bring in machine. Will get cbc and cmp tomorrow.  Follow up in 7 days or as needed  Gayatri Teasdale, Ramon Dredge, [employer]

## 2015-09-18 NOTE — Progress Notes (Signed)
Pre visit review using our clinic review tool, if applicable. No additional management support is needed unless otherwise documented below in the visit note. 

## 2015-09-19 ENCOUNTER — Ambulatory Visit (INDEPENDENT_AMBULATORY_CARE_PROVIDER_SITE_OTHER): Payer: Medicare Other | Admitting: Physician Assistant

## 2015-09-19 VITALS — BP 110/64 | HR 63

## 2015-09-19 DIAGNOSIS — I959 Hypotension, unspecified: Secondary | ICD-10-CM | POA: Diagnosis not present

## 2015-09-19 DIAGNOSIS — I10 Essential (primary) hypertension: Secondary | ICD-10-CM | POA: Diagnosis not present

## 2015-09-19 LAB — CBC WITH DIFFERENTIAL/PLATELET
BASOS ABS: 0 10*3/uL (ref 0.0–0.1)
Basophils Relative: 0.5 % (ref 0.0–3.0)
EOS PCT: 3.6 % (ref 0.0–5.0)
Eosinophils Absolute: 0.2 10*3/uL (ref 0.0–0.7)
HCT: 32 % — ABNORMAL LOW (ref 36.0–46.0)
HEMOGLOBIN: 10.7 g/dL — AB (ref 12.0–15.0)
LYMPHS ABS: 2.4 10*3/uL (ref 0.7–4.0)
Lymphocytes Relative: 51.3 % — ABNORMAL HIGH (ref 12.0–46.0)
MCHC: 33.6 g/dL (ref 30.0–36.0)
MCV: 99.2 fl (ref 78.0–100.0)
MONO ABS: 0.4 10*3/uL (ref 0.1–1.0)
Monocytes Relative: 9.5 % (ref 3.0–12.0)
NEUTROS PCT: 35.1 % — AB (ref 43.0–77.0)
Neutro Abs: 1.6 10*3/uL (ref 1.4–7.7)
Platelets: 230 10*3/uL (ref 150.0–400.0)
RBC: 3.22 Mil/uL — AB (ref 3.87–5.11)
RDW: 14.1 % (ref 11.5–15.5)
WBC: 4.7 10*3/uL (ref 4.0–10.5)

## 2015-09-19 LAB — COMPREHENSIVE METABOLIC PANEL
ALBUMIN: 4.1 g/dL (ref 3.5–5.2)
ALK PHOS: 50 U/L (ref 39–117)
ALT: 15 U/L (ref 0–35)
AST: 16 U/L (ref 0–37)
BILIRUBIN TOTAL: 0.4 mg/dL (ref 0.2–1.2)
BUN: 23 mg/dL (ref 6–23)
CO2: 31 mEq/L (ref 19–32)
CREATININE: 0.94 mg/dL (ref 0.40–1.20)
Calcium: 10.1 mg/dL (ref 8.4–10.5)
Chloride: 108 mEq/L (ref 96–112)
GFR: 61.39 mL/min (ref >60.00)
GLUCOSE: 102 mg/dL — AB (ref 70–99)
POTASSIUM: 4.1 meq/L (ref 3.5–5.1)
SODIUM: 141 meq/L (ref 135–145)
TOTAL PROTEIN: 6.6 g/dL (ref 6.0–8.3)

## 2015-09-19 NOTE — Patient Instructions (Signed)
Per Einar Pheasant: Start taking amlodipine 5 mg (1 tablet) daily. Continue your other medications as prescribed. Follow-up in 1 month.

## 2015-09-19 NOTE — Progress Notes (Signed)
Pre visit review using our clinic review tool, if applicable. No additional management support is needed unless otherwise documented below in the visit note.  Per 09/18/15 AVS: Regarding bp levels. Explained to he come in tomorrow am for nurse bp check. Bring in machine. Will get cbc and cmp tomorrow. Hold amlodipine tonight if bp is same/like it is Now.   Manual BP in office today 110/64, home wrist BP cuff reads 128/60. Reports home BPs have been running 160s/80s.  Per Einar Pheasant: Start taking amlodipine 5 mg (1 tablet) daily. Continue your other medications as prescribed. Follow-up with Einar Pheasant in 1 month.  Pt verbalized understanding of instructions. She declined to schedule f/u appt at this time stating she would only schedule appt if having BP issues. She thinks she has amlodipine 10 mg tablets at home, and has been taking 1 daily. We most recently filled 5 mg tablets. She will check her pill bottle and cut pills in half if 10 mg. Pt sent to lab per AVS.  Dorrene German, RN

## 2015-09-19 NOTE — Progress Notes (Signed)
Reviewed. Plan is as noted.

## 2015-09-20 ENCOUNTER — Telehealth: Payer: Self-pay | Admitting: Medical

## 2015-09-20 DIAGNOSIS — D649 Anemia, unspecified: Secondary | ICD-10-CM

## 2015-09-20 NOTE — Telephone Encounter (Signed)
Future anemia panel and ifob placed.

## 2015-09-24 NOTE — Progress Notes (Signed)
Quick Note:  Pt has seen results on MyChart and message also sent for patient to call back if any questions. ______ 

## 2015-09-29 ENCOUNTER — Telehealth: Payer: Self-pay | Admitting: Physician Assistant

## 2015-09-29 NOTE — Telephone Encounter (Signed)
Ok with [place]. May with me.

## 2015-09-29 NOTE — Telephone Encounter (Signed)
Patient request to change provider from [facility] to [facility]. Plse adv

## 2015-09-29 NOTE — Telephone Encounter (Signed)
Fine with me

## 2015-10-01 NOTE — Telephone Encounter (Signed)
Patient informed via VM that she can transfer her care. Informed to call the office to schedule appointment

## 2015-10-06 ENCOUNTER — Other Ambulatory Visit: Payer: Self-pay | Admitting: Physician Assistant

## 2015-10-06 NOTE — Telephone Encounter (Signed)
Rx request to pharmacy/SLS  

## 2015-10-09 ENCOUNTER — Telehealth: Payer: Self-pay | Admitting: Physician Assistant

## 2015-10-09 NOTE — Telephone Encounter (Signed)
Patient would like to transfer from [place] to [place]. Please advise

## 2015-10-09 NOTE — Telephone Encounter (Signed)
See previous phone note. We have both already approved provider change

## 2015-10-20 ENCOUNTER — Encounter (HOSPITAL_BASED_OUTPATIENT_CLINIC_OR_DEPARTMENT_OTHER): Payer: Self-pay | Admitting: *Deleted

## 2015-10-20 ENCOUNTER — Other Ambulatory Visit: Payer: Medicare Other

## 2015-10-21 ENCOUNTER — Ambulatory Visit
Admission: RE | Admit: 2015-10-21 | Discharge: 2015-10-21 | Disposition: A | Discharge status: Home / Self Care | Payer: Medicare Other | Source: Ambulatory Visit | Attending: Physician Assistant | Admitting: Physician Assistant

## 2015-10-21 ENCOUNTER — Encounter: Payer: Self-pay | Admitting: Radiology

## 2015-10-21 DIAGNOSIS — Z780 Asymptomatic menopausal state: Secondary | ICD-10-CM

## 2015-10-21 DIAGNOSIS — M810 Age-related osteoporosis without current pathological fracture: Secondary | ICD-10-CM | POA: Diagnosis not present

## 2015-10-22 ENCOUNTER — Ambulatory Visit: Payer: Medicare Other | Admitting: Medical

## 2015-10-22 ENCOUNTER — Encounter (HOSPITAL_BASED_OUTPATIENT_CLINIC_OR_DEPARTMENT_OTHER)
Admission: RE | Admit: 2015-10-22 | Discharge: 2015-10-22 | Disposition: A | Discharge status: Home / Self Care | Payer: Medicare Other | Source: Ambulatory Visit | Attending: Orthopedic Surgery | Admitting: Orthopedic Surgery

## 2015-10-22 DIAGNOSIS — R011 Cardiac murmur, unspecified: Secondary | ICD-10-CM | POA: Diagnosis not present

## 2015-10-22 DIAGNOSIS — M13842 Other specified arthritis, left hand: Secondary | ICD-10-CM | POA: Diagnosis not present

## 2015-10-22 DIAGNOSIS — I10 Essential (primary) hypertension: Secondary | ICD-10-CM | POA: Diagnosis not present

## 2015-10-22 DIAGNOSIS — K219 Gastro-esophageal reflux disease without esophagitis: Secondary | ICD-10-CM | POA: Diagnosis not present

## 2015-10-22 DIAGNOSIS — Z981 Arthrodesis status: Secondary | ICD-10-CM | POA: Diagnosis not present

## 2015-10-22 DIAGNOSIS — J449 Chronic obstructive pulmonary disease, unspecified: Secondary | ICD-10-CM | POA: Diagnosis not present

## 2015-10-22 DIAGNOSIS — Z85828 Personal history of other malignant neoplasm of skin: Secondary | ICD-10-CM | POA: Diagnosis not present

## 2015-10-22 DIAGNOSIS — Z87891 Personal history of nicotine dependence: Secondary | ICD-10-CM | POA: Diagnosis not present

## 2015-10-22 DIAGNOSIS — Z886 Allergy status to analgesic agent status: Secondary | ICD-10-CM | POA: Diagnosis not present

## 2015-10-22 DIAGNOSIS — F329 Major depressive disorder, single episode, unspecified: Secondary | ICD-10-CM | POA: Diagnosis not present

## 2015-10-22 DIAGNOSIS — Z885 Allergy status to narcotic agent status: Secondary | ICD-10-CM | POA: Diagnosis not present

## 2015-10-22 DIAGNOSIS — Z96642 Presence of left artificial hip joint: Secondary | ICD-10-CM | POA: Diagnosis not present

## 2015-10-22 DIAGNOSIS — F988 Other specified behavioral and emotional disorders with onset usually occurring in childhood and adolescence: Secondary | ICD-10-CM | POA: Diagnosis not present

## 2015-10-22 DIAGNOSIS — Z881 Allergy status to other antibiotic agents status: Secondary | ICD-10-CM | POA: Diagnosis not present

## 2015-10-22 DIAGNOSIS — G629 Polyneuropathy, unspecified: Secondary | ICD-10-CM | POA: Diagnosis not present

## 2015-10-22 DIAGNOSIS — E039 Hypothyroidism, unspecified: Secondary | ICD-10-CM | POA: Diagnosis not present

## 2015-10-22 LAB — BASIC METABOLIC PANEL
ANION GAP: 4 — AB (ref 5–15)
BUN: 15 mg/dL (ref 6–20)
CHLORIDE: 105 mmol/L (ref 101–111)
CO2: 30 mmol/L (ref 22–32)
Calcium: 10.4 mg/dL — ABNORMAL HIGH (ref 8.9–10.3)
Creatinine, Ser: 1.05 mg/dL — ABNORMAL HIGH (ref 0.44–1.00)
GFR calc non Af Amer: 50 mL/min — ABNORMAL LOW (ref >60)
GFR, EST AFRICAN AMERICAN: 58 mL/min — AB (ref >60)
GLUCOSE: 104 mg/dL — AB (ref 65–99)
Potassium: 4.6 mmol/L (ref 3.5–5.1)
Sodium: 139 mmol/L (ref 135–145)

## 2015-10-23 ENCOUNTER — Other Ambulatory Visit: Payer: Self-pay | Admitting: *Deleted

## 2015-10-23 ENCOUNTER — Ambulatory Visit (HOSPITAL_BASED_OUTPATIENT_CLINIC_OR_DEPARTMENT_OTHER)
Admission: RE | Admit: 2015-10-23 | Discharge: 2015-10-23 | Disposition: A | Discharge status: Home / Self Care | Payer: Medicare Other | Source: Ambulatory Visit | Attending: Orthopedic Surgery | Admitting: Orthopedic Surgery

## 2015-10-23 ENCOUNTER — Encounter (HOSPITAL_BASED_OUTPATIENT_CLINIC_OR_DEPARTMENT_OTHER)
Admission: RE | Disposition: A | Discharge status: Home / Self Care | Payer: Self-pay | Source: Ambulatory Visit | Attending: Orthopedic Surgery

## 2015-10-23 ENCOUNTER — Encounter (HOSPITAL_BASED_OUTPATIENT_CLINIC_OR_DEPARTMENT_OTHER): Payer: Self-pay | Admitting: Anesthesiology

## 2015-10-23 ENCOUNTER — Ambulatory Visit (HOSPITAL_BASED_OUTPATIENT_CLINIC_OR_DEPARTMENT_OTHER): Payer: Medicare Other | Admitting: Anesthesiology

## 2015-10-23 DIAGNOSIS — I10 Essential (primary) hypertension: Secondary | ICD-10-CM | POA: Diagnosis not present

## 2015-10-23 DIAGNOSIS — G629 Polyneuropathy, unspecified: Secondary | ICD-10-CM | POA: Insufficient documentation

## 2015-10-23 DIAGNOSIS — K219 Gastro-esophageal reflux disease without esophagitis: Secondary | ICD-10-CM | POA: Diagnosis not present

## 2015-10-23 DIAGNOSIS — R011 Cardiac murmur, unspecified: Secondary | ICD-10-CM | POA: Diagnosis not present

## 2015-10-23 DIAGNOSIS — M1812 Unilateral primary osteoarthritis of first carpometacarpal joint, left hand: Secondary | ICD-10-CM | POA: Diagnosis not present

## 2015-10-23 DIAGNOSIS — Z886 Allergy status to analgesic agent status: Secondary | ICD-10-CM | POA: Insufficient documentation

## 2015-10-23 DIAGNOSIS — Z981 Arthrodesis status: Secondary | ICD-10-CM | POA: Diagnosis not present

## 2015-10-23 DIAGNOSIS — F329 Major depressive disorder, single episode, unspecified: Secondary | ICD-10-CM | POA: Insufficient documentation

## 2015-10-23 DIAGNOSIS — Z87891 Personal history of nicotine dependence: Secondary | ICD-10-CM | POA: Diagnosis not present

## 2015-10-23 DIAGNOSIS — Z885 Allergy status to narcotic agent status: Secondary | ICD-10-CM | POA: Insufficient documentation

## 2015-10-23 DIAGNOSIS — Z96642 Presence of left artificial hip joint: Secondary | ICD-10-CM | POA: Insufficient documentation

## 2015-10-23 DIAGNOSIS — J449 Chronic obstructive pulmonary disease, unspecified: Secondary | ICD-10-CM | POA: Diagnosis not present

## 2015-10-23 DIAGNOSIS — Z881 Allergy status to other antibiotic agents status: Secondary | ICD-10-CM | POA: Insufficient documentation

## 2015-10-23 DIAGNOSIS — Z85828 Personal history of other malignant neoplasm of skin: Secondary | ICD-10-CM | POA: Insufficient documentation

## 2015-10-23 DIAGNOSIS — M13842 Other specified arthritis, left hand: Secondary | ICD-10-CM | POA: Diagnosis not present

## 2015-10-23 DIAGNOSIS — F988 Other specified behavioral and emotional disorders with onset usually occurring in childhood and adolescence: Secondary | ICD-10-CM | POA: Diagnosis not present

## 2015-10-23 DIAGNOSIS — E039 Hypothyroidism, unspecified: Secondary | ICD-10-CM | POA: Diagnosis not present

## 2015-10-23 DIAGNOSIS — M79642 Pain in left hand: Secondary | ICD-10-CM | POA: Diagnosis not present

## 2015-10-23 DIAGNOSIS — G8918 Other acute postprocedural pain: Secondary | ICD-10-CM | POA: Diagnosis not present

## 2015-10-23 DIAGNOSIS — M189 Osteoarthritis of first carpometacarpal joint, unspecified: Secondary | ICD-10-CM | POA: Diagnosis not present

## 2015-10-23 HISTORY — DX: Unspecified disorder of synovium and tendon, unspecified shoulder: M67.919

## 2015-10-23 HISTORY — PX: CARPOMETACARPEL SUSPENSION PLASTY: SHX5005

## 2015-10-23 SURGERY — CARPOMETACARPEL (CMC) SUSPENSION PLASTY
Anesthesia: Regional | Site: Thumb | Laterality: Left

## 2015-10-23 MED ORDER — PROPOFOL 10 MG/ML IV BOLUS
INTRAVENOUS | Status: AC
  Filled 2015-10-23: qty 20

## 2015-10-23 MED ORDER — MIDAZOLAM HCL 2 MG/2ML IJ SOLN: 1.0000 mg | INTRAMUSCULAR | Status: DC | PRN

## 2015-10-23 MED ORDER — BUPIVACAINE-EPINEPHRINE (PF) 0.5% -1:200000 IJ SOLN
INTRAMUSCULAR | Status: DC | PRN
  Administered 2015-10-23: 25 mL via PERINEURAL

## 2015-10-23 MED ORDER — CHLORHEXIDINE GLUCONATE 4 % EX LIQD: 60.0000 mL | Freq: Once | CUTANEOUS | Status: DC

## 2015-10-23 MED ORDER — VANCOMYCIN HCL IN DEXTROSE 1-5 GM/200ML-% IV SOLN
1000.0000 mg | INTRAVENOUS | Status: AC
  Administered 2015-10-23: 1000 mg via INTRAVENOUS

## 2015-10-23 MED ORDER — OXYCODONE-ACETAMINOPHEN 7.5-325 MG PO TABS: 1.0000 | ORAL_TABLET | ORAL | 0 refills | Status: DC | PRN

## 2015-10-23 MED ORDER — ONDANSETRON HCL 4 MG/2ML IJ SOLN
INTRAMUSCULAR | Status: AC
  Filled 2015-10-23: qty 2

## 2015-10-23 MED ORDER — PROPOFOL 10 MG/ML IV BOLUS
INTRAVENOUS | Status: DC | PRN
  Administered 2015-10-23: 50 mg via INTRAVENOUS
  Administered 2015-10-23: 100 mg via INTRAVENOUS

## 2015-10-23 MED ORDER — DEXAMETHASONE SODIUM PHOSPHATE 10 MG/ML IJ SOLN
INTRAMUSCULAR | Status: AC
Start: 2015-10-23 — End: 2015-10-23
  Filled 2015-10-23: qty 1

## 2015-10-23 MED ORDER — SCOPOLAMINE 1 MG/3DAYS TD PT72
1.0000 | MEDICATED_PATCH | Freq: Once | TRANSDERMAL | Status: DC | PRN
Start: 2015-10-23 — End: 2015-10-23

## 2015-10-23 MED ORDER — GLYCOPYRROLATE 0.2 MG/ML IJ SOLN: 0.2000 mg | Freq: Once | INTRAMUSCULAR | Status: DC | PRN

## 2015-10-23 MED ORDER — ONDANSETRON HCL 4 MG/2ML IJ SOLN
INTRAMUSCULAR | Status: DC | PRN
  Administered 2015-10-23: 4 mg via INTRAVENOUS

## 2015-10-23 MED ORDER — FENTANYL CITRATE (PF) 100 MCG/2ML IJ SOLN: 25.0000 ug | INTRAMUSCULAR | Status: DC | PRN

## 2015-10-23 MED ORDER — LACTATED RINGERS IV SOLN
INTRAVENOUS | Status: DC
  Administered 2015-10-23 (×3): via INTRAVENOUS

## 2015-10-23 MED ORDER — VANCOMYCIN HCL IN DEXTROSE 1-5 GM/200ML-% IV SOLN
INTRAVENOUS | Status: AC
  Filled 2015-10-23: qty 200

## 2015-10-23 MED ORDER — MIDAZOLAM HCL 2 MG/2ML IJ SOLN
INTRAMUSCULAR | Status: AC
  Filled 2015-10-23: qty 2

## 2015-10-23 MED ORDER — FENTANYL CITRATE (PF) 100 MCG/2ML IJ SOLN
50.0000 ug | INTRAMUSCULAR | Status: DC | PRN
  Administered 2015-10-23: 75 ug via INTRAVENOUS

## 2015-10-23 MED ORDER — FENTANYL CITRATE (PF) 100 MCG/2ML IJ SOLN
INTRAMUSCULAR | Status: AC
  Filled 2015-10-23: qty 2

## 2015-10-23 MED ORDER — LIDOCAINE 2% (20 MG/ML) 5 ML SYRINGE
INTRAMUSCULAR | Status: DC | PRN
  Administered 2015-10-23: 20 mg via INTRAVENOUS

## 2015-10-23 MED ORDER — PROMETHAZINE HCL 25 MG/ML IJ SOLN: 6.2500 mg | INTRAMUSCULAR | Status: DC | PRN

## 2015-10-23 MED ORDER — DEXAMETHASONE SODIUM PHOSPHATE 10 MG/ML IJ SOLN
INTRAMUSCULAR | Status: DC | PRN
  Administered 2015-10-23: 4 mg via INTRAVENOUS

## 2015-10-23 SURGICAL SUPPLY — 64 items
BIT DRILL 1/16X5 DISP (BIT) ×2 IMPLANT
BLADE ARTHRO LOK 4 BEAVER (BLADE) IMPLANT
BLADE MINI RND TIP GREEN BEAV (BLADE) ×2 IMPLANT
BLADE SURG 15 STRL LF DISP TIS (BLADE) ×1 IMPLANT
BLADE SURG 15 STRL SS (BLADE) ×4
BNDG CMPR 9X4 STRL LF SNTH (GAUZE/BANDAGES/DRESSINGS) ×1
BNDG COHESIVE 3X5 TAN STRL LF (GAUZE/BANDAGES/DRESSINGS) ×2 IMPLANT
BNDG ESMARK 4X9 LF (GAUZE/BANDAGES/DRESSINGS) ×2 IMPLANT
BNDG GAUZE ELAST 4 BULKY (GAUZE/BANDAGES/DRESSINGS) ×2 IMPLANT
BUR EGG 3PK/BX (BURR) IMPLANT
CHLORAPREP W/TINT 26ML (MISCELLANEOUS) ×2 IMPLANT
CORDS BIPOLAR (ELECTRODE) ×2 IMPLANT
COVER BACK TABLE 60X90IN (DRAPES) ×2 IMPLANT
COVER MAYO STAND STRL (DRAPES) ×2 IMPLANT
CUFF TOURNIQUET SINGLE 18IN (TOURNIQUET CUFF) ×1 IMPLANT
DECANTER SPIKE VIAL GLASS SM (MISCELLANEOUS) IMPLANT
DRAPE EXTREMITY T 121X128X90 (DRAPE) ×2 IMPLANT
DRAPE OEC MINIVIEW 54X84 (DRAPES) ×2 IMPLANT
DRAPE SURG 17X23 STRL (DRAPES) ×2 IMPLANT
DRILL BIT 1/8DIAX5INL DISPOSE (BIT) ×2 IMPLANT
GAUZE SPONGE 4X4 12PLY STRL (GAUZE/BANDAGES/DRESSINGS) ×2 IMPLANT
GAUZE SPONGE 4X4 16PLY XRAY LF (GAUZE/BANDAGES/DRESSINGS) IMPLANT
GAUZE XEROFORM 1X8 LF (GAUZE/BANDAGES/DRESSINGS) ×2 IMPLANT
GLOVE BIO SURGEON STRL SZ 6.5 (GLOVE) ×2 IMPLANT
GLOVE BIOGEL M STRL SZ7.5 (GLOVE) ×2 IMPLANT
GLOVE BIOGEL PI IND STRL 7.0 (GLOVE) IMPLANT
GLOVE BIOGEL PI IND STRL 8 (GLOVE) ×1 IMPLANT
GLOVE BIOGEL PI IND STRL 8.5 (GLOVE) ×1 IMPLANT
GLOVE BIOGEL PI INDICATOR 7.0 (GLOVE) ×2
GLOVE BIOGEL PI INDICATOR 8 (GLOVE) ×1
GLOVE BIOGEL PI INDICATOR 8.5 (GLOVE) ×1
GLOVE SURG ORTHO 8.0 STRL STRW (GLOVE) ×2 IMPLANT
GOWN STRL REUS W/ TWL LRG LVL3 (GOWN DISPOSABLE) ×1 IMPLANT
GOWN STRL REUS W/TWL LRG LVL3 (GOWN DISPOSABLE) ×2
GOWN STRL REUS W/TWL XL LVL3 (GOWN DISPOSABLE) ×3 IMPLANT
NEEDLE PRECISIONGLIDE 27X1.5 (NEEDLE) IMPLANT
NS IRRIG 1000ML POUR BTL (IV SOLUTION) ×2 IMPLANT
PACK BASIN DAY SURGERY FS (CUSTOM PROCEDURE TRAY) ×2 IMPLANT
PAD CAST 3X4 CTTN HI CHSV (CAST SUPPLIES) ×1 IMPLANT
PADDING CAST ABS 3INX4YD NS (CAST SUPPLIES)
PADDING CAST ABS COTTON 3X4 (CAST SUPPLIES) IMPLANT
PADDING CAST COTTON 3X4 STRL (CAST SUPPLIES) ×2
RUBBERBAND STERILE (MISCELLANEOUS) IMPLANT
SLEEVE SCD COMPRESS KNEE MED (MISCELLANEOUS) ×2 IMPLANT
SPLINT PLASTER CAST XFAST 3X15 (CAST SUPPLIES) IMPLANT
SPLINT PLASTER XTRA FASTSET 3X (CAST SUPPLIES) ×10
STOCKINETTE 4X48 STRL (DRAPES) ×2 IMPLANT
SUT ETHIBOND 2 OS 4 DA (SUTURE) IMPLANT
SUT ETHIBOND 3-0 V-5 (SUTURE) IMPLANT
SUT ETHILON 4 0 PS 2 18 (SUTURE) ×3 IMPLANT
SUT FIBERWIRE 2-0 18 17.9 3/8 (SUTURE)
SUT FIBERWIRE 4-0 18 DIAM BLUE (SUTURE) ×2
SUT MERSILENE 4 0 P 3 (SUTURE) IMPLANT
SUT STEEL 3 0 (SUTURE) ×2 IMPLANT
SUT VIC AB 4-0 P-3 18XBRD (SUTURE) ×1 IMPLANT
SUT VIC AB 4-0 P2 18 (SUTURE) IMPLANT
SUT VIC AB 4-0 P3 18 (SUTURE) ×2
SUTURE FIBERWR 2-0 18 17.9 3/8 (SUTURE) IMPLANT
SUTURE FIBERWR 4-0 18 DIA BLUE (SUTURE) ×1 IMPLANT
SYR BULB 3OZ (MISCELLANEOUS) ×2 IMPLANT
SYR CONTROL 10ML LL (SYRINGE) IMPLANT
TOWEL OR 17X24 6PK STRL BLUE (TOWEL DISPOSABLE) ×4 IMPLANT
TOWEL OR NON WOVEN STRL DISP B (DISPOSABLE) ×2 IMPLANT
UNDERPAD 30X30 (UNDERPADS AND DIAPERS) ×2 IMPLANT

## 2015-10-23 NOTE — Anesthesia Postprocedure Evaluation (Signed)
Anesthesia Post Note  Patient: Rose [name]  Procedure(s) Performed: Procedure(s) (LRB): SUSPENSION PLASTY LEFT THUMB TRAPEZIUM EXCISION ABDUCTOR POLLICIS LONGUS TRANSFER (Left)  Patient location during evaluation: PACU Anesthesia Type: General and Regional Level of consciousness: awake and alert Pain management: pain level controlled Vital Signs Assessment: post-procedure vital signs reviewed and stable Respiratory status: spontaneous breathing, nonlabored ventilation, respiratory function stable and patient connected to nasal cannula oxygen Cardiovascular status: blood pressure returned to baseline and stable Postop Assessment: no signs of nausea or vomiting Anesthetic complications: no    Last Vitals:  Vitals:   10/23/15 1615 10/23/15 1616  BP:  (!) 154/65  Pulse:    Resp: 16   Temp:      Last Pain:  Vitals:   10/23/15 1615  TempSrc:   PainSc: 0-No pain                 Tiajuana Amass

## 2015-10-23 NOTE — Anesthesia Preprocedure Evaluation (Signed)
Anesthesia Evaluation  Patient identified by MRN, date of birth, ID band Patient awake    Reviewed: Allergy & Precautions, NPO status , Patient's Chart, lab work & pertinent test results  Airway Mallampati: I  TM Distance: >3 FB Neck ROM: Full    Dental  (+) Teeth Intact, Dental Advisory Given   Pulmonary COPD, former smoker,    breath sounds clear to auscultation       Cardiovascular hypertension, Pt. on medications + Peripheral Vascular Disease   Rhythm:Regular Rate:Normal     Neuro/Psych PSYCHIATRIC DISORDERS Depression    GI/Hepatic GERD  Medicated,  Endo/Other  Hypothyroidism   Renal/GU Renal disease     Musculoskeletal   Abdominal   Peds  Hematology negative hematology ROS (+)   Anesthesia Other Findings   Reproductive/Obstetrics                             Anesthesia Physical  Anesthesia Plan  ASA: II  Anesthesia Plan: General and Regional   Post-op Pain Management: GA combined w/ Regional for post-op pain   Induction: Intravenous  Airway Management Planned: LMA  Additional Equipment:   Intra-op Plan: Utilization of Controlled Hypotension per surrgeon request  Post-operative Plan: Extubation in OR  Informed Consent: I have reviewed the patients History and Physical, chart, labs and discussed the procedure including the risks, benefits and alternatives for the proposed anesthesia with the patient or authorized representative who has indicated his/her understanding and acceptance.   Dental advisory given  Plan Discussed with: CRNA  Anesthesia Plan Comments:         Anesthesia Quick Evaluation

## 2015-10-23 NOTE — Brief Op Note (Signed)
10/23/2015  3:39 PM  PATIENT:  Rose [name]  75 y.o. female  PRE-OPERATIVE DIAGNOSIS:  CMC ARTHRITIS LEFT THUMB   POST-OPERATIVE DIAGNOSIS:  carpometacarpal arthritis left thumb  PROCEDURE:  Procedure(s): SUSPENSION PLASTY LEFT THUMB TRAPEZIUM EXCISION ABDUCTOR POLLICIS LONGUS TRANSFER (Left)  SURGEON:  Surgeon(s) and Role:    * Daryll Brod, MD - Primary    * Leanora Cover, MD - Assisting  PHYSICIAN ASSISTANT:   ASSISTANTS: K [place],MD   ANESTHESIA:   regional and general  EBL:  Total I/O In: 1100 [I.V.:1100] Out: 1 [Blood:1]  BLOOD ADMINISTERED:none  DRAINS: none   LOCAL MEDICATIONS USED:  NONE  SPECIMEN:  No Specimen  DISPOSITION OF SPECIMEN:  N/A  COUNTS:  YES  TOURNIQUET:   Total Tourniquet Time Documented: Upper Arm (Left) - 61 minutes Total: Upper Arm (Left) - 61 minutes   DICTATION: .Other Dictation: Dictation Number [id number]  PLAN OF CARE: Discharge to home after PACU  PATIENT DISPOSITION:  PACU - hemodynamically stable.

## 2015-10-23 NOTE — Progress Notes (Signed)
Assisted Dr. Rob Fitzgerald with left, ultrasound guided, axillary block. Side rails up, monitors on throughout procedure. See vital signs in flow sheet. Tolerated Procedure well. 

## 2015-10-23 NOTE — Op Note (Signed)
Rose [name], Rose [name]              ACCOUNT NO.:  [id number]  MEDICAL RECORD NO.:  [id number]  LOCATION:     [facility]                            FACILITY:  PHYSICIAN:  Daryll Brod, M.D.            DATE OF BIRTH:  DATE OF PROCEDURE:  10/23/2015 DATE OF DISCHARGE:                              OPERATIVE REPORT   PREOPERATIVE DIAGNOSIS:  Pantrapezial arthritis, left thumb.  POSTOPERATIVE DIAGNOSIS:  Pantrapezial arthritis, left thumb.  OPERATION:  Excision of trapezium with abductor pollicis longus tendon transfer, left thumb.  SURGEON:  Daryll Brod, M.D.  ASSISTANT:  Leanora Cover, MD.  ANESTHESIA:  Axillary block, general.  ANESTHESIOLOGIST:  W. Suzette Battiest, MD  HISTORY:  The patient is a 75 year old female with a long history of CMC arthritis.  This has not responded to continued conservative treatment. She has undergone suspension plasty on her right side and is desirous proceeding to have left side similarly repaired.  Pre, peri and postoperative course were discussed along with risks and complications. She is aware that there was no guarantee with the surgery; the possibility of infection; recurrence of injury to arteries, nerves, tendons; incomplete relief of symptoms; dystrophy.  In the preoperative area, the patient is seen, the extremity was marked by both the patient and surgeon.  PROCEDURE IN DETAIL:  The patient was brought to the operating room where a general anesthetic was carried out without difficulty having had an axillary block performed in the preoperative area.  She was prepped using ChloraPrep in the supine position with left arm free.  A 3-minute dry-time was allowed, time-out was taken, confirming the patient and procedure.  The limb was exsanguinated with an Esmarch bandage. Tourniquet placed high on the arm was inflated to 250 mmHg.  A hockey- stick incision was then made on the radial aspect of the left thumb base, carried along the  abductor pollicis longus first dorsal compartment tendon sheath, carried down through the subcutaneous tissue. Bleeders were electrocauterized with bipolar.  The radial nerve was identified and protected.  The dissection was carried between the extensor pollicis brevis and abductor pollicis longus tendon.  The [facility] joint was identified, this was then incised with a Beaver blade and this wound was extended proximally.  The radial artery was identified proximally.  The STT joint was identified, this was opened.  The periosteum was then incised around the trapezium both volarly and dorsally taking care to protect the radial artery.  The joint between the trapezium and trapezoid was identified with a Soil scientist.  This was then elevated, cutting the ligaments supporting the two bones.  This allowed the trapezium to be mobilized, this was grasped with a rongeur. With a twisting motion, this was able to be removed in toto in one piece.  There was no articular surface present on the proximal aspect of the carpal base with total eburnation of bone.  The STT joint showed mild degenerative changes.  A Valora Corporal was placed to elevate the periosteum at the base of the metacarpal just dorsal to the insertion of the abductor pollicis longus tendon.  The dorsal three-quarters of one-half of the  abductor pollicis longus inserting into bone was then isolated. A separate incision was then made at the musculotendinous junction of the abductor pollicis longus.  This was deepened with a hemostat.  The sheath was opened and a monofilament wire was used as a cheese cutter to cut the dorsal portion of the abductor pollicis longus tendon.  This was pulled proximally through the wound after placement of a Chief Technology Officer through the first dorsal compartment.  This allowed the abductor pollicis longus to be cut at the musculotendinous junction and left attached to the base of the metacarpal.  A drill hole was  then placed in the base of the metacarpal of the thumb from a radial dorsal to ulnar palmar direction.  This was enlarged.  A second drill hole was then placed through the base of the index metacarpal in a volar-to- radial to dorsal ulnar position.  This allowed an incision to be made over the metacarpal where the drill bit exited, allowing visualization of the drill hole.  The incision for transection of the abductor pollicis longus proximally was then closed with interrupted 4-0 nylon sutures.  The abductor pollicis longus tendon was then delivered through the hole in the metacarpal of the thumb from a dorsal radial to ulnar portal position using a monofilament wire.  This was then passed through the index metacarpal base.  This was done and a volar-to-dorsal, radial- to-ulnar direction.  A long hemostat was then used to create a path where the tendon to be brought back into the carpometacarpal area of the thumb.  This was done along the shaft of the index metacarpal in a near subperiosteal manner.  This allowed protection of the extensor tendons and also allowed protection of the radial artery.  The tendon was then delivered into the carpometacarpal area of the thumb.  This was passed and sutured to the abductor pollicis longus as it exited from the base of the thumb metacarpal and sutured multiple times with 4-0 FiberWire. This firmly fixed the suspension in position.  There was no proximal migration of the thumb metacarpal.  The remainder of the abductor pollicis longus tendon was then sutured into an anchovy using the FiberWire.  The wounds were then copiously irrigated with saline.  The skin from the dorsal wound at the metacarpal base of the index was closed with interrupted 4-0 nylon.  The periosteum and capsule of the STT joint, the [facility] joint was then sutured with figure-of-eight 4-0 Vicryl sutures.  The subcutaneous tissue was closed with interrupted 4-0 Vicryl taking care to  protect the radial nerve and the skin with interrupted 4-0 nylon sutures.  X-rays confirmed good suspension of the thumb from the index finger.  This was performed in AP, lateral and oblique Roberts-type x-ray.  The sterile compressive dressing, dorsal palmar thumb spica splint was applied.  On deflation of the tourniquet, all fingers were immediately pinked.  She was taken to the recovery room for observation in satisfactory condition.  She will be discharged to home to return to the [facility] in 1 week, on Percocet.          ______________________________ Daryll Brod, M.D.     GK/MEDQ  D:  10/23/2015  T:  10/23/2015  Job:  [id number]

## 2015-10-23 NOTE — Discharge Instructions (Addendum)
°Post Anesthesia Home Care Instructions ° °Activity: °Get plenty of rest for the remainder of the day. A responsible adult should stay with you for 24 hours following the procedure.  °For the next 24 hours, DO NOT: °-Drive a car °-Operate machinery °-Drink alcoholic beverages °-Take any medication unless instructed by your physician °-Make any legal decisions or sign important papers. ° °Meals: °Start with liquid foods such as gelatin or soup. Progress to regular foods as tolerated. Avoid greasy, spicy, heavy foods. If nausea and/or vomiting occur, drink only clear liquids until the nausea and/or vomiting subsides. Call your physician if vomiting continues. ° °Special Instructions/Symptoms: °Your throat may feel dry or sore from the anesthesia or the breathing tube placed in your throat during surgery. If this causes discomfort, gargle with warm salt water. The discomfort should disappear within 24 hours. ° °If you had a scopolamine patch placed behind your ear for the management of post- operative nausea and/or vomiting: ° °1. The medication in the patch is effective for 72 hours, after which it should be removed.  Wrap patch in a tissue and discard in the trash. Wash hands thoroughly with soap and water. °2. You may remove the patch earlier than 72 hours if you experience unpleasant side effects which may include dry mouth, dizziness or visual disturbances. °3. Avoid touching the patch. Wash your hands with soap and water after contact with the patch. °  °Regional Anesthesia Blocks ° °1. Numbness or the inability to move the "blocked" extremity may last from 3-48 hours after placement. The length of time depends on the medication injected and your individual response to the medication. If the numbness is not going away after 48 hours, call your surgeon. ° °2. The extremity that is blocked will need to be protected until the numbness is gone and the  Strength has returned. Because you cannot feel it, you will need  to take extra care to avoid injury. Because it may be weak, you may have difficulty moving it or using it. You may not know what position it is in without looking at it while the block is in effect. ° °3. For blocks in the legs and feet, returning to weight bearing and walking needs to be done carefully. You will need to wait until the numbness is entirely gone and the strength has returned. You should be able to move your leg and foot normally before you try and bear weight or walk. You will need someone to be with you when you first try to ensure you do not fall and possibly risk injury. ° °4. Bruising and tenderness at the needle site are common side effects and will resolve in a few days. ° °5. Persistent numbness or new problems with movement should be communicated to the surgeon or the [facility] ([phone])/ [facility] ([phone]). ° ° °Hand Center Instructions °Hand Surgery ° °Wound Care: °Keep your hand elevated above the level of your heart.  Do not allow it to dangle by your side.  Keep the dressing dry and do not remove it unless your doctor advises you to do so.  He will usually change it at the time of your post-op visit.  Moving your fingers is advised to stimulate circulation but will depend on the site of your surgery.  If you have a splint applied, your doctor will advise you regarding movement. ° °Activity: °Do not drive or operate machinery today.  Rest today and then you may return   then you may return to your normal activity and work as indicated by your physician.  Diet:  Drink liquids today or eat a light diet.  You may resume a regular diet tomorrow.    General expectations: Pain for two to three days. Fingers may become slightly swollen.  Call your doctor if any of the following occur: Severe pain not relieved by pain medication. Elevated temperature. Dressing soaked with blood. Inability to move fingers. White or bluish color to fingers.  

## 2015-10-23 NOTE — H&P (Signed)
Rose [name] is an 75 y.o. female.   Chief Complaint: pain left thumb HPI: [name] is a 75yo female with thumb arthritis.Her left thumb CMC arthritis continues to bother her. She is wearing splints.She has undergone CMC arthroplasty on her right side. She has been treated conservatively for this with splints, anti-inflammatories, injections, but continues to complain of pain. She states she attempted to play golf and would like to get back to it, but she was unable to swing golf club. She took a lesson and states that her pain increased to at least 5-6/10. She states now she is not having any pain in the thumb without movement, but any movement causes discomfort of the carpometacarpal joint. She is desirous of proceeding to have this operated on. She has undergone suspensionplasty on her right side.          Past Medical History:  Diagnosis Date  . Abnormal blood pressure    left arm from old brachial artery repair  . Anemia   . Anxiety   . Atherosclerosis of renal artery (HCC)   . Attention deficit disorder without mention of hyperactivity   . Basal cell carcinoma of face   . Benign heart murmur   . Carotid artery disease (HCC)   . Carotid bruit    Left  . Centrilobular emphysema (HCC)   . Chronic bronchitis (HCC)   . Depression   . DJD (degenerative joint disease) of hip   . Esophageal reflux   . History of depression   . History of spinal fusion   . Hypertension   . Morton's neuroma    Hx of, Left  . Myalgia and myositis, unspecified   . Other tenosynovitis of hand and wrist   . Peripheral neuropathy (HCC)   . Personal history of alcoholism (HCC)   . Personal history of peptic ulcer disease   . Pulmonary nodule   . Rotator cuff disorder    pain  . Solitary cyst of breast   . Subclavian steal syndrome   . Unspecified hypothyroidism   . Wears glasses     Past Surgical History:  Procedure Laterality Date  . APPENDECTOMY    . CARPOMETACARPEL SUSPENSION PLASTY  Right 08/07/2013   Procedure: CARPOMETACARPEL [facility]) SUSPENSION PLASTY RIGHT THUMB;  Surgeon: Nicki Reaper, MD;  Location: [facility];  Service: Orthopedics;  Laterality: Right;  . CERVICAL FUSION  2007   Dr. Maylene Roes approach  . JOINT REPLACEMENT Left   . Left brachial artery repair of pseudoaneurysm  2001   post a cath  . LUMBAR FUSION  09/2004   T12-L5 (Dr. Noel Gerold)  . MINOR IRRIGATION AND DEBRIDEMENT OF WOUND Right 08/27/2014   Procedure: MINOR IRRIGATION AND DEBRIDEMENT OF WOUND;  Surgeon: Cindee Salt, MD;  Location: [facility];  Service: Orthopedics;  Laterality: Right;  . ORIF TIBIA FRACTURE  2010   Left distal  . Percutanous Transluminal Angioplasty of Renal Arteris    . REPAIR EXTENSOR TENDON Right 09/26/2014   Procedure: REPAIR EXTENSOR TENDON RIGHT RING FINGER ;  Surgeon: Cindee Salt, MD;  Location: [facility];  Service: Orthopedics;  Laterality: Right;  . SHOULDER ARTHROSCOPY  09/2008   Left  . TONSILLECTOMY AND ADENOIDECTOMY    . TOTAL HIP ARTHROPLASTY  2008   left  . WISDOM TOOTH EXTRACTION      Family History  Problem Relation Age of Onset  . Adopted: Yes  . Schizophrenia Son     Deceased 18  .  Hypertension Daughter     Renal  . Healthy Son     x1  . Healthy Daughter     x2   Social History:  reports that she quit smoking about 32 years ago. Her smoking use included Cigarettes. She has a 25.00 pack-year smoking history. She has never used smokeless tobacco. She reports that she drinks alcohol. She reports that she does not use drugs.  Allergies:  Allergies  Allergen Reactions  . Amoxicillin     REACTION: Rash  . Codeine   . Hydrocodone-Acetaminophen     REACTION: itching  . Hydromorphone Hcl   . Morphine And Related Hives and Itching  . Morphine Sulfate     REACTION: unspecified  . Nsaids     Ulcer    No prescriptions prior to admission.    Results for orders placed or performed during the hospital  encounter of 10/23/15 (from the past 48 hour(s))  Basic metabolic panel     Status: Abnormal   Collection Time: 10/22/15  9:10 AM  Result Value Ref Range   Sodium 139 135 - 145 mmol/L   Potassium 4.6 3.5 - 5.1 mmol/L   Chloride 105 101 - 111 mmol/L   CO2 30 22 - 32 mmol/L   Glucose, Bld 104 (H) 65 - 99 mg/dL   BUN 15 6 - 20 mg/dL   Creatinine, Ser 4.78 (H) 0.44 - 1.00 mg/dL   Calcium 29.5 (H) 8.9 - 10.3 mg/dL   GFR calc non Af Amer 50 (L) >60 mL/min   GFR calc Af Amer 58 (L) >60 mL/min    Comment: (NOTE) The eGFR has been calculated using the CKD EPI equation. This calculation has not been validated in all clinical situations. eGFR's persistently <60 mL/min signify possible Chronic Kidney Disease.    Anion gap 4 (L) 5 - 15    Dg Bone Density  Result Date: 10/21/2015 EXAM: DUAL X-RAY ABSORPTIOMETRY (DXA) FOR BONE MINERAL DENSITY IMPRESSION: Referring Physician:  Waldon Merl PATIENT: Name: Rose [name] Patient ID: [id number] Birth Date: 1940/10/02 Height: 63.0 in. Sex: Female Measured: 10/21/2015 Weight: 118.0 lbs. Indications: Advanced Age, cymbalta, Depression, Estrogen Deficient, Gabapentin, Height Loss (781.91), History of Fracture (Adult) (V15.51), Hypothyroid, Left hip replaced, Low Body Weight (783.22), Synthroid, Wellbutrin Fractures: Ankle, Foot Treatments: Vitamin D (E933.5) ASSESSMENT: The BMD measured at Forearm Radius 33% is 0.626 g/cm2 with a T-score of -2.9. This patient is considered osteoporotic according to World Health Organization [facility]) criteria. Left femur and lumbar spine were excluded due to surgical hardware. Site Region Measured Date Measured Age YA BMD Significant CHANGE T-score Left Forearm Radius 33% 10/21/2015 76.9 -2.9 0.626 g/cm2 Right Femur Neck 10/21/2015 76.9 -1.7 0.800 g/cm2 World Health Organization [facility]) criteria for post-menopausal, Caucasian Women: Normal       T-score at or above -1 SD Osteopenia   T-score between -1 and -2.5 SD Osteoporosis  T-score at or below -2.5 SD RECOMMENDATION: National Osteoporosis Foundation recommends that FDA-approved medical therapies be considered in postmenopausal women and men age 89 or older with a: 1. Hip or vertebral (clinical or morphometric) fracture. 2. T-score of <-2.5 at the spine or hip. 3. Ten-year fracture probability by FRAX of 3% or greater for hip fracture or 20% or greater for major osteoporotic fracture. All treatment decisions require clinical judgment and consideration of individual patient factors, including patient preferences, co-morbidities, previous drug use, risk factors not captured in the FRAX model (e.g. falls, vitamin D deficiency, increased bone turnover, interval significant  decline in bone density) and possible under - or over-estimation of fracture risk by FRAX. All patients should ensure an adequate intake of dietary calcium (1200 mg/d) and vitamin D (800 IU daily) unless contraindicated. FOLLOW-UP: People with diagnosed cases of osteoporosis or at high risk for fracture should have regular bone mineral density tests. For patients eligible for Medicare, routine testing is allowed once every 2 years. The testing frequency can be increased to one year for patients who have rapidly progressing disease, those who are receiving or discontinuing medical therapy to restore bone mass, or have additional risk factors. I have reviewed this report, and agree with the above findings. [facility] Radiology Electronically Signed   By: Beckie Salts M.D.   On: 10/21/2015 14:09     Pertinent items are noted in HPI.  Height 5\' 3"  (1.6 m), weight 54.9 kg (121 lb).  General appearance: alert, cooperative and appears stated age Head: Normocephalic, without obvious abnormality Neck: no JVD Resp: clear to auscultation bilaterally Cardio: regular rate and rhythm, S1, S2 normal, no murmur, click, rub or gallop GI: soft, non-tender; bowel sounds normal; no masses,  no organomegaly Extremities: pain  left thumb Pulses: 2+ and symmetric Skin: Skin color, texture, turgor normal. No rashes or lesions Neurologic: Grossly normal Incision/Wound: na  Assessment/Plan  X-rays: AP, lateral, and Robert view revealed pantrapezial arthritis, left thumb. She has mild degenerative changes at the remaining MP and IP joints also. These were both asymptomatic.  DIAGNOSIS: Carpometacarpal arthritis, pantrapezial, stage IV, left thumb. She is aware of risks and complications.  PLAN: Plan is suspensionplasty with excision of trapezium, APL transfer. Postoperative course has been discussed along with risks and complications. She is aware there is no guarantee with the surgery, the possibility of infection, recurrence, injury to arteries, nerves, tendons, complete relief of symptoms, dystrophy, the length of time for rehabilitation. This is scheduled as an outpatient under regional / general anesthesia.      Hayat Warbington R 10/23/2015, 10:47 AM

## 2015-10-23 NOTE — Op Note (Signed)
Dictation Number [id number]

## 2015-10-23 NOTE — Anesthesia Procedure Notes (Signed)
Anesthesia Regional Block:  Axillary brachial plexus block  Pre-Anesthetic Checklist: ,, timeout performed, Correct Patient, Correct Site, Correct Laterality, Correct Procedure, Correct Position, site marked, Risks and benefits discussed,  Surgical consent,  Pre-op evaluation,  At surgeon's request and post-op pain management  Laterality: Left  Prep: chloraprep       Needles:  Injection technique: Single-shot  Needle Type: Echogenic Stimulator Needle     Needle Length: 9cm 9 cm Needle Gauge: 21 and 21 G    Additional Needles:  Procedures: ultrasound guided (picture in chart) and nerve stimulator Axillary brachial plexus block  Nerve Stimulator or Paresthesia:  Response: median, musculocutaneous, radial and ulnar responses, 0.5 mA,   Additional Responses:   Narrative:  Start time: 10/23/2015 12:48 PM End time: 10/23/2015 12:56 PM  Performed by: Personally  Anesthesiologist: Suzette Battiest

## 2015-10-23 NOTE — Transfer of Care (Signed)
Immediate Anesthesia Transfer of Care Note  Patient: Rose [name]  Procedure(s) Performed: Procedure(s): SUSPENSION PLASTY LEFT THUMB TRAPEZIUM EXCISION ABDUCTOR POLLICIS LONGUS TRANSFER (Left)  Patient Location: PACU  Anesthesia Type:GA combined with regional for post-op pain  Level of Consciousness: awake, alert  and patient cooperative  Airway & Oxygen Therapy: Patient Spontanous Breathing and Patient connected to face mask oxygen  Post-op Assessment: Report given to RN, Post -op Vital signs reviewed and stable and Patient moving all extremities  Post vital signs: Reviewed and stable  Last Vitals:  Vitals:   10/23/15 1300 10/23/15 1545  BP: (!) 197/73 (!) 141/88  Pulse: 76 64  Resp: 20 (!) 25  Temp:      Last Pain:  Vitals:   10/23/15 1211  TempSrc: Oral         Complications: No apparent anesthesia complications

## 2015-10-23 NOTE — Anesthesia Procedure Notes (Addendum)
Procedure Name: LMA Insertion Date/Time: 10/23/2015 2:26 PM Performed by: Baxter Flattery Pre-anesthesia Checklist: Patient identified, Emergency Drugs available, Suction available and Patient being monitored Patient Re-evaluated:Patient Re-evaluated prior to inductionOxygen Delivery Method: Circle system utilized Preoxygenation: Pre-oxygenation with 100% oxygen Intubation Type: IV induction Ventilation: Mask ventilation without difficulty LMA: LMA inserted LMA Size: 4.0 Number of attempts: 2 Placement Confirmation: positive ETCO2 Tube secured with: Tape Dental Injury: Teeth and Oropharynx as per pre-operative assessment

## 2015-10-23 NOTE — Op Note (Signed)
I assisted Surgeon(s) and Role:    * Daryll Brod, MD - Primary    * Leanora Cover, MD - Assisting on the Procedure(s): SUSPENSION PLASTY LEFT THUMB TRAPEZIUM EXCISION ABDUCTOR POLLICIS LONGUS TRANSFER on 10/23/2015.  I provided assistance on this case as follows: retraction of soft tissues, harvest of tendon, passing of tendon.  Electronically signed by: Tennis Must, MD Date: 10/23/2015 Time: 3:40 PM

## 2015-10-24 ENCOUNTER — Encounter (HOSPITAL_BASED_OUTPATIENT_CLINIC_OR_DEPARTMENT_OTHER): Payer: Self-pay | Admitting: Orthopedic Surgery

## 2015-10-27 MED ORDER — ALENDRONATE SODIUM 70 MG PO TABS: 70.0000 mg | ORAL_TABLET | ORAL | 11 refills | Status: DC

## 2015-10-29 ENCOUNTER — Ambulatory Visit (INDEPENDENT_AMBULATORY_CARE_PROVIDER_SITE_OTHER): Payer: Medicare Other | Admitting: Medical

## 2015-10-29 ENCOUNTER — Encounter: Payer: Self-pay | Admitting: Medical

## 2015-10-29 VITALS — BP 110/70 | HR 64 | Temp 98.7°F | Ht 63.0 in | Wt 119.4 lb

## 2015-10-29 DIAGNOSIS — D649 Anemia, unspecified: Secondary | ICD-10-CM | POA: Diagnosis not present

## 2015-10-29 LAB — CBC WITH DIFFERENTIAL/PLATELET
BASOS PCT: 0.5 % (ref 0.0–3.0)
Basophils Absolute: 0 10*3/uL (ref 0.0–0.1)
EOS ABS: 0.2 10*3/uL (ref 0.0–0.7)
Eosinophils Relative: 4.5 % (ref 0.0–5.0)
HEMATOCRIT: 34 % — AB (ref 36.0–46.0)
Hemoglobin: 11.5 g/dL — ABNORMAL LOW (ref 12.0–15.0)
LYMPHS PCT: 37.8 % (ref 12.0–46.0)
Lymphs Abs: 1.8 10*3/uL (ref 0.7–4.0)
MCHC: 33.9 g/dL (ref 30.0–36.0)
MCV: 98.1 fl (ref 78.0–100.0)
MONO ABS: 0.5 10*3/uL (ref 0.1–1.0)
Monocytes Relative: 9.6 % (ref 3.0–12.0)
NEUTROS ABS: 2.3 10*3/uL (ref 1.4–7.7)
Neutrophils Relative %: 47.6 % (ref 43.0–77.0)
PLATELETS: 275 10*3/uL (ref 150.0–400.0)
RBC: 3.47 Mil/uL — ABNORMAL LOW (ref 3.87–5.11)
RDW: 13.2 % (ref 11.5–15.5)
WBC: 4.9 10*3/uL (ref 4.0–10.5)

## 2015-10-29 LAB — IRON AND TIBC
%SAT: 8 % — ABNORMAL LOW (ref 11–50)
Iron: 24 ug/dL — ABNORMAL LOW (ref 45–160)
TIBC: 284 ug/dL (ref 250–450)
UIBC: 260 ug/dL (ref 125–400)

## 2015-10-29 LAB — FERRITIN: Ferritin: 59.6 ng/mL (ref 10.0–291.0)

## 2015-10-29 MED ORDER — BUPROPION HCL ER (SR) 150 MG PO TB12: 150.0000 mg | ORAL_TABLET | Freq: Two times a day (BID) | ORAL | 3 refills | Status: DC

## 2015-10-29 MED ORDER — DIAZEPAM 5 MG PO TABS: 5.0000 mg | ORAL_TABLET | Freq: Two times a day (BID) | ORAL | 0 refills | Status: DC | PRN

## 2015-10-29 NOTE — Progress Notes (Signed)
Subjective:    Patient ID: Rose [name], female    DOB: 1940-10-14, 75 y.o.   MRN: [id number]  HPI   Pt in for evaluation. I saw pt for abrasion from fall after getting pulled down by [facility]. She gave the dog away. Her abrasion rt elbow   Left hand surgery recently as well. Done this past surgery by hand surgeon. Pt has follow with surgeon this Friday for post surgical check. She may get PT.  Pt htn is well controlled. For brief time her bp was 150/90. But now she is happy with bp readings. She states she lost her bp cuff. Last reading she got 10 days ago was 120/75. Pt is on norvasc. 10 mg a day. Pt is still on clonidine twice a day.Also on valsartan 160 mg a day.  Pt states she feels some depression. Feels lonely. She is on wellbutrin and cymalta. Pt feels easily agitated. Some anxiety as well.  Pt states history of ADHD. But her bp will increase with stimulants and knows can't take.   Pt has some recent anemia. Will recheck today. Pt has been on iron twice a day now for a couple of months.      Review of Systems  Constitutional: Negative for chills, fatigue and fever.  Respiratory: Negative for cough, chest tightness, shortness of breath and wheezing.   Cardiovascular: Negative for chest pain and palpitations.  Gastrointestinal: Negative for abdominal pain, blood in stool, constipation and vomiting.  Musculoskeletal: Negative for back pain.  Skin: Negative for pallor.  Neurological: Negative for dizziness, seizures, syncope, weakness, light-headedness and headaches.  Hematological: Negative for adenopathy. Does not bruise/bleed easily.  Psychiatric/Behavioral: Positive for agitation, decreased concentration and dysphoric mood. Negative for sleep disturbance and suicidal ideas. The patient is nervous/anxious.     Past Medical History:  Diagnosis Date  . Abnormal blood pressure    left arm from old brachial artery repair  . Anemia   . Anxiety   . Atherosclerosis  of renal artery (HCC)   . Attention deficit disorder without mention of hyperactivity   . Basal cell carcinoma of face   . Benign heart murmur   . Carotid artery disease (HCC)   . Carotid bruit    Left  . Centrilobular emphysema (HCC)   . Chronic bronchitis (HCC)   . Depression   . DJD (degenerative joint disease) of hip   . Esophageal reflux   . History of depression   . History of spinal fusion   . Hypertension   . Morton's neuroma    Hx of, Left  . Myalgia and myositis, unspecified   . Other tenosynovitis of hand and wrist   . Peripheral neuropathy (HCC)   . Personal history of alcoholism (HCC)   . Personal history of peptic ulcer disease   . Pulmonary nodule   . Rotator cuff disorder    pain  . Solitary cyst of breast   . Subclavian steal syndrome   . Unspecified hypothyroidism   . Wears glasses      Social History   Social History  . Marital status: Single    Spouse name: N/A  . Number of children: Y  . Years of education: N/A   Occupational History  . retired Retired    IT trainer, had own firm   Social History Main Topics  . Smoking status: Former Smoker    Packs/day: 1.00    Years: 25.00    Types: Cigarettes    Quit  date: 03/16/1983  . Smokeless tobacco: Never Used  . Alcohol use Yes     Comment: 2-3 glasses wine daily  . Drug use: No  . Sexual activity: Not on file   Other Topics Concern  . Not on file   Social History Narrative   [employer]-accounting      Married '60-94yr divorced; '84-2 years, divorced      3 daughters- '61, '70, '71; 2 sons-'53,'55 (schizophrenic-died); 10 grandchildren      Lives alone with 1 dog and 3 cats      Pt unsure of family history- was adopted.                   Past Surgical History:  Procedure Laterality Date  . APPENDECTOMY    . CARPOMETACARPEL SUSPENSION PLASTY Right 08/07/2013   Procedure: CARPOMETACARPEL [facility]) SUSPENSION PLASTY RIGHT THUMB;  Surgeon: Nicki Reaper, MD;  Location: [facility];  Service: Orthopedics;  Laterality: Right;  . CARPOMETACARPEL SUSPENSION PLASTY Left 10/23/2015   Procedure: SUSPENSION PLASTY LEFT THUMB TRAPEZIUM EXCISION ABDUCTOR POLLICIS LONGUS TRANSFER;  Surgeon: Cindee Salt, MD;  Location: [facility];  Service: Orthopedics;  Laterality: Left;  . CERVICAL FUSION  2007   Dr. Maylene Roes approach  . JOINT REPLACEMENT Left   . Left brachial artery repair of pseudoaneurysm  2001   post a cath  . LUMBAR FUSION  09/2004   T12-L5 (Dr. Noel Gerold)  . MINOR IRRIGATION AND DEBRIDEMENT OF WOUND Right 08/27/2014   Procedure: MINOR IRRIGATION AND DEBRIDEMENT OF WOUND;  Surgeon: Cindee Salt, MD;  Location: [facility];  Service: Orthopedics;  Laterality: Right;  . ORIF TIBIA FRACTURE  2010   Left distal  . Percutanous Transluminal Angioplasty of Renal Arteris    . REPAIR EXTENSOR TENDON Right 09/26/2014   Procedure: REPAIR EXTENSOR TENDON RIGHT RING FINGER ;  Surgeon: Cindee Salt, MD;  Location: [facility];  Service: Orthopedics;  Laterality: Right;  . SHOULDER ARTHROSCOPY  09/2008   Left  . TONSILLECTOMY AND ADENOIDECTOMY    . TOTAL HIP ARTHROPLASTY  2008   left  . WISDOM TOOTH EXTRACTION      Family History  Problem Relation Age of Onset  . Adopted: Yes  . Schizophrenia Son     Deceased 41  . Hypertension Daughter     Renal  . Healthy Son     x1  . Healthy Daughter     x2    Allergies  Allergen Reactions  . Amoxicillin     REACTION: Rash  . Codeine   . Hydrocodone-Acetaminophen     REACTION: itching  . Hydromorphone Hcl   . Morphine And Related Hives and Itching  . Morphine Sulfate     REACTION: unspecified  . Nsaids     Ulcer    Current Outpatient Prescriptions on File Prior to Visit  Medication Sig Dispense Refill  . amLODipine (NORVASC) 5 MG tablet Take 2 tablets (10 mg total) by mouth daily. 90 tablet 0  . aspirin 81 MG tablet Take 81 mg by mouth daily.     Marland Kitchen buPROPion  (WELLBUTRIN SR) 100 MG 12 hr tablet TAKE ONE TABLET TWO TIMES A DAY 60 tablet 0  . cloNIDine (CATAPRES) 0.1 MG tablet TAKE 1 TABLET BY MOUTH TWICE DAILY 60 tablet 1  . diazepam (VALIUM) 5 MG tablet Take 1 tablet (5 mg total) by mouth every 12 (twelve) hours as needed for anxiety or muscle spasms (use  sparingly). 10 tablet 0  . DULoxetine (CYMBALTA) 60 MG capsule TAKE ONE CAPSULE BY MOUTH DAILY 30 capsule 5  . ferrous sulfate dried (SLOW FE) 160 (50 FE) MG TBCR Take 160 mg by mouth 2 (two) times daily. Patient has been taking Once Daily    . gabapentin (NEURONTIN) 600 MG tablet Take 1 tablet (600 mg total) by mouth 3 (three) times daily. 90 tablet 5  . Glucosamine 500 MG TABS Take 1 tablet by mouth 2 (two) times daily. Glucosamine-Chondronton    . levothyroxine (SYNTHROID, LEVOTHROID) 112 MCG tablet TAKE 1 TABLET (112 MCG TOTAL) BY MOUTH DAILY BEFORE BREAKFAST. 30 tablet 5  . oxyCODONE-acetaminophen (PERCOCET) 7.5-325 MG tablet Take 1 tablet by mouth every 4 (four) hours as needed for severe pain. 30 tablet 0  . valsartan (DIOVAN) 80 MG tablet Take 2 tablets (160 mg total) by mouth daily. 60 tablet 1  . VESICARE 10 MG tablet TAKE 1 TABLET BY MOUTH DAILY 30 tablet 3  . alendronate (FOSAMAX) 70 MG tablet Take 1 tablet (70 mg total) by mouth every 7 (seven) days. Take with a full glass of water on an empty stomach. (Patient not taking: Reported on 10/29/2015) 4 tablet 11  . mupirocin ointment (BACTROBAN) 2 % Apply to area twice daily (Patient not taking: Reported on 10/29/2015) 22 g 0   No current facility-administered medications on file prior to visit.     BP (!) 104/51   Pulse 64   Temp 98.7 F (37.1 C) (Oral)   Ht 5\' 3"  (1.6 m)   Wt 119 lb 6.4 oz (54.2 kg)   SpO2 95%   BMI 21.15 kg/m       Objective:   Physical Exam  General Mental Status- Alert. General Appearance- Not in acute distress.   Skin General: Color- Normal Color. Moisture- Normal Moisture.    Chest and Lung  Exam Auscultation: Breath Sounds:-Normal. CTA.  Cardiovascular Auscultation:Rythm- Regular. Murmurs & Other Heart Sounds:Auscultation of the heart reveals- No Murmurs.  Abdomen Inspection:-Inspeection Normal. Palpation/Percussion:Note:No mass. Palpation and Percussion of the abdomen reveal- Non Tender, Non Distended + BS, no rebound or guarding.  Neurologic Cranial Nerve exam:- CN III-XII intact(No nystagmus), symmetric smile. Strength:- 5/5 equal and symmetric strength both upper and lower extremities.  Lt hand- has cast on.       Assessment & Plan:  Your bp is a little low today. Continue current regimen. Keep log of bp readings. Bring on follow up.  For your depression continue cymbalta. I think increasing wellbutrin may help with mood but also may help with ADD. Stimulants I don't think good option with htn history as well as age.  For anxiety will rx more tabs of valium. But still try to minimize use.   Recheck blood work for anemia today.  Follow up in one month or as needed  Carmel Garfield, [place], [place]

## 2015-10-29 NOTE — Patient Instructions (Addendum)
Your bp is a little low today. Continue current regimen. Keep log of bp readings. Bring on follow up.  For your depression continue cymbalta. I think increasing wellbutrin may help with mood but also may help with ADD. Stimulants I don't think good option with htn history as well as age.  For anxiety will rx more tabs of valium. But still try to minimize use.   Recheck blood work for anemia today.  Follow up in one month or as needed

## 2015-10-29 NOTE — Progress Notes (Signed)
Pre visit review using our clinic tool,if applicable. No additional management support is needed unless otherwise documented below in the visit note.  

## 2015-10-30 ENCOUNTER — Other Ambulatory Visit: Payer: Self-pay | Admitting: Physician Assistant

## 2015-10-30 DIAGNOSIS — I10 Essential (primary) hypertension: Secondary | ICD-10-CM

## 2015-10-30 NOTE — Telephone Encounter (Signed)
Will defer further refills of patient's medications to PCP  

## 2015-10-30 NOTE — Telephone Encounter (Signed)
Pt called in to check the status of her refill request.  °

## 2015-10-31 DIAGNOSIS — M1812 Unilateral primary osteoarthritis of first carpometacarpal joint, left hand: Secondary | ICD-10-CM | POA: Diagnosis not present

## 2015-10-31 DIAGNOSIS — M25649 Stiffness of unspecified hand, not elsewhere classified: Secondary | ICD-10-CM | POA: Diagnosis not present

## 2015-10-31 DIAGNOSIS — M79645 Pain in left finger(s): Secondary | ICD-10-CM | POA: Diagnosis not present

## 2015-11-03 ENCOUNTER — Other Ambulatory Visit: Payer: Self-pay | Admitting: Physician Assistant

## 2015-11-03 DIAGNOSIS — E039 Hypothyroidism, unspecified: Secondary | ICD-10-CM

## 2015-11-03 NOTE — Telephone Encounter (Signed)
Will defer further refills of patient's medications to PCP  

## 2015-11-03 NOTE — Telephone Encounter (Signed)
I spoke with the pt and advised her that a 30 day supply was sent to her pharmacy and that she would need repeat blood work before further refills will be given. Pt did not have any further questions. Pt has a lab appointment for 11/05/15.

## 2015-11-05 ENCOUNTER — Other Ambulatory Visit (INDEPENDENT_AMBULATORY_CARE_PROVIDER_SITE_OTHER): Payer: Medicare Other

## 2015-11-05 ENCOUNTER — Other Ambulatory Visit: Payer: Self-pay | Admitting: Physician Assistant

## 2015-11-05 DIAGNOSIS — E039 Hypothyroidism, unspecified: Secondary | ICD-10-CM

## 2015-11-05 LAB — TSH: TSH: 1.86 u[IU]/mL (ref 0.35–4.50)

## 2015-11-05 NOTE — Telephone Encounter (Signed)
Will defer further refills of patient's medications to PCP  

## 2015-11-05 NOTE — Telephone Encounter (Signed)
Rx filled for pt 11/05/15.

## 2015-11-06 ENCOUNTER — Other Ambulatory Visit: Payer: Self-pay

## 2015-11-06 MED ORDER — LEVOTHYROXINE SODIUM 112 MCG PO TABS: ORAL_TABLET | ORAL | 3 refills | Status: DC

## 2015-11-12 ENCOUNTER — Other Ambulatory Visit: Payer: Self-pay | Admitting: Physician Assistant

## 2015-11-12 ENCOUNTER — Ambulatory Visit: Payer: Medicare Other | Admitting: Medical

## 2015-11-12 NOTE — Telephone Encounter (Signed)
Will defer further refills of patient's medications to PCP  

## 2015-11-12 NOTE — Telephone Encounter (Signed)
Rx refill of her diovan. Her bp in office last time was on the low side. So ask her check her bp daily basis. Does she have a cuff. Want to know if is systolic dropping less than 100. If so or if she feels weak with low end bp then may need to adjust dosing. If she has no cuff then can she schedule nurse bp check or check with me next week sometime early in am or early afternoon.

## 2015-11-17 NOTE — Telephone Encounter (Signed)
[facility] sent fax message which I saw this weekend. They wanted refill of pt amlodipine 5 mg a day  for 4 days. Not sure why they only asked for 4 days. Call pt and see how she is. How her blood pressures are doing. Has she been out of amlodipine? Let me know. Could rx more than 4 tabs but want to know first what her blood pressure levels are. They were on low side last visit.

## 2015-11-19 NOTE — Telephone Encounter (Signed)
Patient states her BPs have been running 120/65 in the AM and

## 2015-12-03 DIAGNOSIS — M1812 Unilateral primary osteoarthritis of first carpometacarpal joint, left hand: Secondary | ICD-10-CM | POA: Diagnosis not present

## 2015-12-05 ENCOUNTER — Other Ambulatory Visit: Payer: Self-pay | Admitting: Physician Assistant

## 2015-12-05 NOTE — Telephone Encounter (Signed)
Pt states you mentioned gyn/incontinence specialist. Do you remember name of MD?

## 2015-12-05 NOTE — Telephone Encounter (Signed)
Will defer further refills of patient's medications to PCP  

## 2015-12-17 ENCOUNTER — Ambulatory Visit: Payer: Medicare Other | Admitting: Occupational Therapy

## 2015-12-30 ENCOUNTER — Other Ambulatory Visit: Payer: Self-pay | Admitting: Physician Assistant

## 2015-12-30 NOTE — Telephone Encounter (Signed)
Will defer further refills of patient's medications to PCP  

## 2015-12-31 NOTE — Telephone Encounter (Signed)
[facility] rx filled. Please notify pt.

## 2016-01-03 ENCOUNTER — Other Ambulatory Visit: Payer: Self-pay | Admitting: Medical

## 2016-01-05 ENCOUNTER — Ambulatory Visit: Payer: Medicare Other | Attending: Orthopedic Surgery | Admitting: Occupational Therapy

## 2016-01-05 DIAGNOSIS — M25642 Stiffness of left hand, not elsewhere classified: Secondary | ICD-10-CM | POA: Diagnosis not present

## 2016-01-05 DIAGNOSIS — R278 Other lack of coordination: Secondary | ICD-10-CM

## 2016-01-05 DIAGNOSIS — M25542 Pain in joints of left hand: Secondary | ICD-10-CM | POA: Insufficient documentation

## 2016-01-05 DIAGNOSIS — M6281 Muscle weakness (generalized): Secondary | ICD-10-CM | POA: Diagnosis not present

## 2016-01-05 NOTE — Therapy (Signed)
[facility] [address]  [place], [place], [address] Phone: [phone]   Fax:  [phone]  Occupational Therapy Evaluation  Patient Details  Name: Rose [name] MRN: [id number] Date of Birth: [birth year] Referring Provider: Dr. Daryll Brod  Encounter Date: 01/05/2016      OT End of Session - 01/05/16 1336    Visit Number 1   Number of Visits 9   Date for OT Re-Evaluation 02/05/16   Authorization Type MCR - G Code needed   Authorization - Visit Number 1   Authorization - Number of Visits 10   OT Start Time 1100   OT Stop Time 1145   OT Time Calculation (min) 45 min   Activity Tolerance Patient tolerated treatment well      Past Medical History:  Diagnosis Date  . Abnormal blood pressure    left arm from old brachial artery repair  . Anemia   . Anxiety   . Atherosclerosis of renal artery ([place])   . Attention deficit disorder without mention of hyperactivity   . Basal cell carcinoma of face   . Benign heart murmur   . Carotid artery disease ([place])   . Carotid bruit    Left  . Centrilobular emphysema ([place])   . Chronic bronchitis ([place])   . Depression   . DJD (degenerative joint disease) of hip   . Esophageal reflux   . History of depression   . History of spinal fusion   . Hypertension   . Morton's neuroma    Hx of, Left  . Myalgia and myositis, unspecified   . Other tenosynovitis of hand and wrist   . Peripheral neuropathy ([place])   . Personal history of alcoholism ([place])   . Personal history of peptic ulcer disease   . Pulmonary nodule   . Rotator cuff disorder    pain  . Solitary cyst of breast   . Subclavian steal syndrome   . Unspecified hypothyroidism   . Wears glasses     Past Surgical History:  Procedure Laterality Date  . APPENDECTOMY    . CARPOMETACARPEL SUSPENSION PLASTY Right 08/07/2013   Procedure: CARPOMETACARPEL [facility]) SUSPENSION PLASTY RIGHT THUMB;  Surgeon: Wynonia Sours, MD;   Location: [place];  Service: Orthopedics;  Laterality: Right;  . CARPOMETACARPEL SUSPENSION PLASTY Left 10/23/2015   Procedure: SUSPENSION PLASTY LEFT THUMB TRAPEZIUM EXCISION ABDUCTOR POLLICIS LONGUS TRANSFER;  Surgeon: Daryll Brod, MD;  Location: [place];  Service: Orthopedics;  Laterality: Left;  . CERVICAL FUSION  2007   Dr. Valli Glance approach  . JOINT REPLACEMENT Left   . Left brachial artery repair of pseudoaneurysm  2001   post a cath  . LUMBAR FUSION  09/2004   T12-L5 (Dr. Patrice Paradise)  . MINOR IRRIGATION AND DEBRIDEMENT OF WOUND Right 08/27/2014   Procedure: MINOR IRRIGATION AND DEBRIDEMENT OF WOUND;  Surgeon: Daryll Brod, MD;  Location: [facility] [facility];  Service: Orthopedics;  Laterality: Right;  . ORIF TIBIA FRACTURE  2010   Left distal  . Percutanous Transluminal Angioplasty of Renal Arteris    . REPAIR EXTENSOR TENDON Right 09/26/2014   Procedure: REPAIR EXTENSOR TENDON RIGHT RING FINGER ;  Surgeon: Daryll Brod, MD;  Location: [facility];  Service: Orthopedics;  Laterality: Right;  . SHOULDER ARTHROSCOPY  09/2008   Left  . TONSILLECTOMY AND ADENOIDECTOMY    . TOTAL HIP ARTHROPLASTY  2008   left  . WISDOM TOOTH  EXTRACTION      There were no vitals filed for this visit.      Subjective Assessment - 01/05/16 1110    Subjective  I got the surgery back in August   Pertinent History suspension plasty 10/23/15 Lt thumb, (same operation Rt thumb years ago)    Patient Stated Goals to use my Lt hand like my Rt pain free   Currently in Pain? No/denies   Pain Score --  up to 10/10 at times   Pain Location --  thumb   Pain Orientation Left   Pain Descriptors / Indicators Aching   Pain Type Chronic pain   Pain Onset More than a month ago   Pain Frequency Intermittent   Aggravating Factors  overuse   Pain Relieving Factors rest           [facility] OT Assessment - 01/05/16 0001      Assessment   Diagnosis Lt thumb CMC  OA  Suspension plasty 10/23/15   Referring Provider Dr. Daryll Brod   Onset Date --  years ago   Assessment pt arrives with no brace and reports no longer needing hard brace; soft pre-fab splint only prn for comfort per pt report   Prior Therapy none for Lt hand other than splint fabrication     Precautions   Precautions None     Balance Screen   Has the patient fallen in the past 6 months Yes   How many times? 2   Has the patient had a decrease in activity level because of a fear of falling?  No   Is the patient reluctant to leave their home because of a fear of falling?  No     Home  Environment   Additional Comments Pt lives on 1st floor apt with level entry   Lives With Alone     Prior Function   Level of Independence Independent  pt does not drive   Vocation Retired     ADL   Field seismologist   Lower Body Bathing Independent   Upper Body Dressing Independent   Lower Body Dressing Increased time  to tie shoes and buttoning   Engineering geologist - Water engineer -  Administrator Grab bars     IADL   Shopping Needs to be accompanied on any shopping trip  or drives golf cart to grocery store   [place] to complete simple warm meal prep;Able to complete simple cold meal and snack prep  light cooking   Community Mobility Relies on family or friends for transportation   Medication Management Is responsible for taking medication in correct dosages at correct time   Financial Management Dependent  daughter performs     Mobility   Mobility Status --  walks with tripod cane     Written Expression   Dominant Hand Right   Handwriting --  no changes     Vision - History   Baseline Vision Wears glasses all the time   Visual History Cataracts     Cognition    Behaviors Impulsive;Verbal agitation;Restless  pt w/ ADHD & very verbose during eval - needs to be redirected t/o evaluation to stay on task     Observation/Other Assessments   Observations noted OA at MP's especially Rt/Lt index MP     Sensation  Additional Comments denies change     Coordination   9 Hole Peg Test Right;Left   Right 9 Hole Peg Test 27.37 sec   Left 9 Hole Peg Test 31.60 sec     Edema   Edema joint swelling from arthritis     ROM / Strength   AROM / PROM / Strength AROM     AROM   Overall AROM Comments BUE AROM WFL's.      Left Hand AROM   L Thumb MCP 0-60 35 Degrees  Rt = 48   L Thumb Radial ADduction/ABduction 0-55 95   L Thumb Palmar ADduction/ABduction 0-45 70   L Thumb Opposition to Index --  5th digit     Hand Function   Right Hand Grip (lbs) 30 lbs   Right Hand Lateral Pinch 9 lbs   Right Hand 3 Point Pinch 6.5 lbs   Left Hand Grip (lbs) 17 lbs   Left Hand Lateral Pinch 1 lbs   Left 3 point pinch 2 lbs                              OT Long Term Goals - 01/05/16 1340      OT LONG TERM GOAL #1   Title Independent with HEP (All LTG's due 02/05/16)    Time 4   Period Weeks   Status New     OT LONG TERM GOAL #2   Title Grip strength to be 23 lbs or greater Lt hand to assist with opening containers/jars   Baseline eval = 17 lbs (Rt = 30 lbs)   Time 4   Period Weeks   Status New     OT LONG TERM GOAL #3   Title Lateral and tip pinch Lt hand to increase by 3 lbs each for functional tasks   Baseline eval: lat = 1 lb (Rt = 9 lbs), tip = 2 lbs (Rt = 6.5 lbs)   Time 4   Period Weeks   Status New     OT LONG TERM GOAL #4   Title Pt to verbalize understanding with pain reduction strategies for Lt hand/thumb   Time 4   Period Weeks   Status New     OT LONG TERM GOAL #5   Title Pt to increase Lt MP thumb flexion to 40* or greater to make buttoning and tying shoes more efficient   Baseline eval = 35* (Rt = 48*)     Time 4   Period Weeks   Status New               Plan - 01/05/16 1336    Clinical Impression Statement Pt is a 75 y.o. female who presents to outpatient rehab s/p Lt thumb suspension plasty on 10/23/15. Pt with OA Lt thumb CMC joint. Pt with no precautions at this time. Pt's deficits include decreased strength, ROM, and increased pain which limit efficiency with ADLS.    Rehab Potential Fair   Clinical Impairments Affecting Rehab Potential CHRONIC   OT Frequency 2x / week   OT Duration 4 weeks  PLUS EVALUATION   OT Treatment/Interventions Self-care/ADL training;Moist Heat;Fluidtherapy;DME and/or AE instruction;Splinting;Patient/family education;Therapeutic activities;Scar mobilization;Cryotherapy;Iontophoresis;Passive range of motion;Electrical Stimulation;Parrafin;Manual Therapy   Plan paraffin, issue HEP for thumb ROM and strengthening   Consulted and Agree with Plan of Care Patient      Patient will benefit from skilled therapeutic intervention in order to improve the following  deficits and impairments:  Decreased coordination, Decreased range of motion, Increased edema, Decreased activity tolerance, Decreased knowledge of use of DME, Impaired UE functional use, Pain, Decreased strength  Visit Diagnosis: Pain in joints of left hand - Plan: Ot plan of care cert/re-cert  Muscle weakness (generalized) - Plan: Ot plan of care cert/re-cert  Stiffness of left hand, not elsewhere classified - Plan: Ot plan of care cert/re-cert  Other lack of coordination - Plan: Ot plan of care cert/re-cert      G-Codes - [id number]    Functional Assessment Tool Used Lt grip = 17 lbs, Lt Lateral pinch = 1 lb, pain up to 10/10 Lt hand/thumb   Functional Limitation Carrying, moving and handling objects   Carrying, Moving and Handling Objects Current Status [id number]) At least 40 percent but less than 60 percent impaired, limited or restricted   Carrying, Moving and Handling Objects Goal Status  [id number]) At least 20 percent but less than 40 percent impaired, limited or restricted      Problem List Patient Active Problem List   Diagnosis Date Noted  . Tensor fascia lata syndrome 03/04/2015  . Leg length discrepancy 10/29/2014  . Fecal incontinence 10/21/2014  . Muscle atrophy 06/18/2014  . Arthritis of left shoulder region 06/18/2014  . ADD (attention deficit disorder) 09/25/2013  . Encounter for Medicare annual wellness exam 08/02/2013  . Screening for osteoporosis 08/02/2013  . Depression 07/19/2013  . Hypothyroidism 07/19/2013  . Lung mass 07/19/2013  . COPD (chronic obstructive pulmonary disease) ([place]) 07/19/2013  . OTHER TENOSYNOVITIS OF HAND AND WRIST 11/26/2009  . Hereditary and idiopathic peripheral neuropathy 11/25/2009  . CAROTID ARTERY DISEASE 09/21/2008  . ALCOHOL ABUSE, HX OF 09/21/2008  . SUBCLAVIAN STEAL SYNDROME 09/20/2008  . Pain due to total hip replacement ([place]) 01/23/2008  . RENAL ARTERY STENOSIS 10/11/2007  . Malignant neoplasm of skin of parts of face 02/21/2007  . MORTON'S NEUROMA, LEFT 02/21/2007  . BREAST CYST 02/21/2007  . CAROTID BRUIT, LEFT 02/21/2007  . DUODENAL ULCER, HX OF 02/21/2007  . Other acquired absence of organ 02/21/2007  . CARCINOMA, BASAL CELL, FACE 02/21/2007  . Essential hypertension 08/09/2006  . GERD 08/09/2006  . HEART MURMUR, BENIGN 08/09/2006    Carey Bullocks, OTR/L 01/05/2016, 1:48 PM  [facility] [address]  [place], [place], [address] Phone: [phone]   Fax:  [phone]  Name: Rose [name] MRN: [id number] Date of Birth: Jul 11, 1940

## 2016-01-06 NOTE — Telephone Encounter (Signed)
Will defer further refills of patient's medications to PCP  

## 2016-01-06 NOTE — Telephone Encounter (Signed)
I refilled pt clonidine.

## 2016-01-07 ENCOUNTER — Encounter (INDEPENDENT_AMBULATORY_CARE_PROVIDER_SITE_OTHER): Payer: Self-pay | Admitting: Ophthalmology

## 2016-01-11 ENCOUNTER — Other Ambulatory Visit: Payer: Self-pay | Admitting: Physician Assistant

## 2016-01-11 NOTE — Telephone Encounter (Signed)
Will defer further refills of patient's medications to PCP  

## 2016-01-11 NOTE — Telephone Encounter (Signed)
Refilled her wellbutrin But looks like she might not need a refill since last rx had 3 refills?

## 2016-01-12 DIAGNOSIS — Z1231 Encounter for screening mammogram for malignant neoplasm of breast: Secondary | ICD-10-CM | POA: Diagnosis not present

## 2016-01-12 DIAGNOSIS — Z01419 Encounter for gynecological examination (general) (routine) without abnormal findings: Secondary | ICD-10-CM | POA: Diagnosis not present

## 2016-01-12 DIAGNOSIS — Z1389 Encounter for screening for other disorder: Secondary | ICD-10-CM | POA: Diagnosis not present

## 2016-01-12 DIAGNOSIS — N3281 Overactive bladder: Secondary | ICD-10-CM | POA: Diagnosis not present

## 2016-01-12 DIAGNOSIS — D649 Anemia, unspecified: Secondary | ICD-10-CM | POA: Diagnosis not present

## 2016-01-12 DIAGNOSIS — Z130 Encounter for screening for diseases of the blood and blood-forming organs and certain disorders involving the immune mechanism: Secondary | ICD-10-CM | POA: Diagnosis not present

## 2016-01-19 ENCOUNTER — Ambulatory Visit: Payer: Medicare Other | Attending: Orthopedic Surgery | Admitting: Occupational Therapy

## 2016-01-19 ENCOUNTER — Encounter (INDEPENDENT_AMBULATORY_CARE_PROVIDER_SITE_OTHER): Payer: Medicare Other | Admitting: Ophthalmology

## 2016-01-19 DIAGNOSIS — M6281 Muscle weakness (generalized): Secondary | ICD-10-CM

## 2016-01-19 DIAGNOSIS — H35033 Hypertensive retinopathy, bilateral: Secondary | ICD-10-CM

## 2016-01-19 DIAGNOSIS — H353131 Nonexudative age-related macular degeneration, bilateral, early dry stage: Secondary | ICD-10-CM | POA: Diagnosis not present

## 2016-01-19 DIAGNOSIS — H43813 Vitreous degeneration, bilateral: Secondary | ICD-10-CM | POA: Diagnosis not present

## 2016-01-19 DIAGNOSIS — M25542 Pain in joints of left hand: Secondary | ICD-10-CM | POA: Diagnosis not present

## 2016-01-19 DIAGNOSIS — B399 Histoplasmosis, unspecified: Secondary | ICD-10-CM

## 2016-01-19 DIAGNOSIS — I10 Essential (primary) hypertension: Secondary | ICD-10-CM | POA: Diagnosis not present

## 2016-01-19 DIAGNOSIS — M25642 Stiffness of left hand, not elsewhere classified: Secondary | ICD-10-CM

## 2016-01-19 NOTE — Patient Instructions (Signed)
Opposition (Active)   Touch tip of thumb to nail tip of each finger in turn, making an "O" shape. Repeat __10__ times. Do _3__ sessions per day.   MP Flexion (Active)   Bend thumb to touch base of little finger, keeping tip joint straight. Repeat __10-15__ times. Do _3__ sessions per day.      Radial Adduction/Abduction (Active)    Move thumb out to side. Move back alongside index finger. Repeat __10-15__ times. Do __3__ sessions per day.   Palmar Adduction/Abduction (Active)    Move thumb down, away from palm. Move back to rest along palm. Repeat _10-15___ times. Do __3__ sessions per day.     1. Grip Strengthening (Resistive Putty)   Squeeze putty using thumb and all fingers. Repeat _10-15___ times. Do __2__ sessions per day.   2. Roll putty into tube on table and pinch between each finger and thumb x 10 reps each. (Do ring and small finger together)    3. Lateral Pinch Strengthening (Resistive Putty)    Squeeze between thumb and side of each finger in turn. Repeat __10__ times. Do __2__ sessions per day.    4. Extension (Resistive Putty)    Straighten thumb inside putty loop anchored by fingers. Repeat __10__ times. Do __2__ sessions per day.  Can also do with rubberband Copyright  [employer]. All rights reserved.

## 2016-01-19 NOTE — Therapy (Signed)
[facility] [address]  [place], [place], [address] Phone: [phone]   Fax:  [phone]  Occupational Therapy Treatment  Patient Details  Name: Rose [name] MRN: [id number] Date of Birth: 09-21-1940 Referring Provider: Dr. Daryll Brod  Encounter Date: 01/19/2016      OT End of Session - 01/19/16 1323    Visit Number 2   Number of Visits 9   Date for OT Re-Evaluation 02/05/16   Authorization Type MCR - G Code needed   Authorization - Visit Number 2   Authorization - Number of Visits 10   OT Start Time [id number]   OT Stop Time 1315   OT Time Calculation (min) 45 min   Equipment Utilized During Treatment paraffin   Activity Tolerance Patient tolerated treatment well      Past Medical History:  Diagnosis Date  . Abnormal blood pressure    left arm from old brachial artery repair  . Anemia   . Anxiety   . Atherosclerosis of renal artery ([place])   . Attention deficit disorder without mention of hyperactivity   . Basal cell carcinoma of face   . Benign heart murmur   . Carotid artery disease ([place])   . Carotid bruit    Left  . Centrilobular emphysema ([place])   . Chronic bronchitis ([place])   . Depression   . DJD (degenerative joint disease) of hip   . Esophageal reflux   . History of depression   . History of spinal fusion   . Hypertension   . Morton's neuroma    Hx of, Left  . Myalgia and myositis, unspecified   . Other tenosynovitis of hand and wrist   . Peripheral neuropathy ([place])   . Personal history of alcoholism ([place])   . Personal history of peptic ulcer disease   . Pulmonary nodule   . Rotator cuff disorder    pain  . Solitary cyst of breast   . Subclavian steal syndrome   . Unspecified hypothyroidism   . Wears glasses     Past Surgical History:  Procedure Laterality Date  . APPENDECTOMY    . CARPOMETACARPEL SUSPENSION PLASTY Right 08/07/2013   Procedure: CARPOMETACARPEL [facility]) SUSPENSION  PLASTY RIGHT THUMB;  Surgeon: Wynonia Sours, MD;  Location: [place];  Service: Orthopedics;  Laterality: Right;  . CARPOMETACARPEL SUSPENSION PLASTY Left 10/23/2015   Procedure: SUSPENSION PLASTY LEFT THUMB TRAPEZIUM EXCISION ABDUCTOR POLLICIS LONGUS TRANSFER;  Surgeon: Daryll Brod, MD;  Location: [place];  Service: Orthopedics;  Laterality: Left;  . CERVICAL FUSION  2007   Dr. Valli Glance approach  . JOINT REPLACEMENT Left   . Left brachial artery repair of pseudoaneurysm  2001   post a cath  . LUMBAR FUSION  09/2004   T12-L5 (Dr. Patrice Paradise)  . MINOR IRRIGATION AND DEBRIDEMENT OF WOUND Right 08/27/2014   Procedure: MINOR IRRIGATION AND DEBRIDEMENT OF WOUND;  Surgeon: Daryll Brod, MD;  Location: [place];  Service: Orthopedics;  Laterality: Right;  . ORIF TIBIA FRACTURE  2010   Left distal  . Percutanous Transluminal Angioplasty of Renal Arteris    . REPAIR EXTENSOR TENDON Right 09/26/2014   Procedure: REPAIR EXTENSOR TENDON RIGHT RING FINGER ;  Surgeon: Daryll Brod, MD;  Location: [place];  Service: Orthopedics;  Laterality: Right;  . SHOULDER ARTHROSCOPY  09/2008   Left  . TONSILLECTOMY AND ADENOIDECTOMY    . TOTAL HIP ARTHROPLASTY  2008  left  . WISDOM TOOTH EXTRACTION      There were no vitals filed for this visit.      Subjective Assessment - 01/19/16 1236    Subjective  I still have to be careful, it can hurt if I overdo it. I think that one brace was making it worse   Pertinent History suspension plasty 10/23/15 Lt thumb, (same operation Rt thumb years ago)    Patient Stated Goals to use my Lt hand like my Rt pain free   Currently in Pain? No/denies                      OT Treatments/Exercises (OP) - 01/19/16 0001      Exercises   Exercises Hand     Hand Exercises   Other Hand Exercises Issued HEP for A/ROM and strengthening for Lt hand/thumb - see pt instructions     Modalities    Modalities Paraffin     LUE Paraffin   Number Minutes Paraffin 10 Minutes   LUE Paraffin Location Hand;Wrist   Comments to decr. stiffness at beginning of session     Splinting   Splinting Pt shown neoprene thumb spica splint for comfort with gripping/lifting activities but pt was not sure if she already has similar splint. Pt to bring in current splint to assess. Pt no longer requires splint from surgery but this would be more for comfort during more aggravating activities only                OT Education - 01/19/16 1257    Education provided Yes   Education Details Thumb A/ROM and putty HEP    Person(s) Educated Patient   Methods Explanation;Demonstration;Handout   Comprehension Verbalized understanding;Returned demonstration;Verbal cues required;Need further instruction             OT Long Term Goals - 01/05/16 1340      OT LONG TERM GOAL #1   Title Independent with HEP (All LTG's due 02/05/16)    Time 4   Period Weeks   Status New     OT LONG TERM GOAL #2   Title Grip strength to be 23 lbs or greater Lt hand to assist with opening containers/jars   Baseline eval = 17 lbs (Rt = 30 lbs)   Time 4   Period Weeks   Status New     OT LONG TERM GOAL #3   Title Lateral and tip pinch Lt hand to increase by 3 lbs each for functional tasks   Baseline eval: lat = 1 lb (Rt = 9 lbs), tip = 2 lbs (Rt = 6.5 lbs)   Time 4   Period Weeks   Status New     OT LONG TERM GOAL #4   Title Pt to verbalize understanding with pain reduction strategies for Lt hand/thumb   Time 4   Period Weeks   Status New     OT LONG TERM GOAL #5   Title Pt to increase Lt MP thumb flexion to 40* or greater to make buttoning and tying shoes more efficient   Baseline eval = 35* (Rt = 48*)    Time 4   Period Weeks   Status New               Plan - 01/19/16 1323    Clinical Impression Statement Pt returns today after initial evaluation for therapy. Pt with decr. pain and tolerating  HEP well with mod v.c's required.  Rehab Potential Fair   Clinical Impairments Affecting Rehab Potential CHRONIC   OT Frequency 2x / week   OT Duration 4 weeks   OT Treatment/Interventions Self-care/ADL training;Moist Heat;Fluidtherapy;DME and/or AE instruction;Splinting;Patient/family education;Therapeutic activities;Scar mobilization;Cryotherapy;Iontophoresis;Passive range of motion;Electrical Stimulation;Parrafin;Manual Therapy   Plan continue paraffin, assess current neoprene brace if pt brings in, review HEP, clothespin activity   Consulted and Agree with Plan of Care Patient      Patient will benefit from skilled therapeutic intervention in order to improve the following deficits and impairments:  Decreased coordination, Decreased range of motion, Increased edema, Decreased activity tolerance, Decreased knowledge of use of DME, Impaired UE functional use, Pain, Decreased strength  Visit Diagnosis: Pain in joints of left hand  Muscle weakness (generalized)  Stiffness of left hand, not elsewhere classified    Problem List Patient Active Problem List   Diagnosis Date Noted  . Tensor fascia lata syndrome 03/04/2015  . Leg length discrepancy 10/29/2014  . Fecal incontinence 10/21/2014  . Muscle atrophy 06/18/2014  . Arthritis of left shoulder region 06/18/2014  . ADD (attention deficit disorder) 09/25/2013  . Encounter for Medicare annual wellness exam 08/02/2013  . Screening for osteoporosis 08/02/2013  . Depression 07/19/2013  . Hypothyroidism 07/19/2013  . Lung mass 07/19/2013  . COPD (chronic obstructive pulmonary disease) ([place]) 07/19/2013  . OTHER TENOSYNOVITIS OF HAND AND WRIST 11/26/2009  . Hereditary and idiopathic peripheral neuropathy 11/25/2009  . CAROTID ARTERY DISEASE 09/21/2008  . ALCOHOL ABUSE, HX OF 09/21/2008  . SUBCLAVIAN STEAL SYNDROME 09/20/2008  . Pain due to total hip replacement ([place]) 01/23/2008  . RENAL ARTERY STENOSIS 10/11/2007  . Malignant  neoplasm of skin of parts of face 02/21/2007  . MORTON'S NEUROMA, LEFT 02/21/2007  . BREAST CYST 02/21/2007  . CAROTID BRUIT, LEFT 02/21/2007  . DUODENAL ULCER, HX OF 02/21/2007  . Other acquired absence of organ 02/21/2007  . CARCINOMA, BASAL CELL, FACE 02/21/2007  . Essential hypertension 08/09/2006  . GERD 08/09/2006  . HEART MURMUR, BENIGN 08/09/2006    Carey Bullocks, OTR/L 01/19/2016, 1:26 PM  [facility] [address]  [place], [place], [address] Phone: [phone]   Fax:  [phone]  Name: STEPHENY AMBROSIA MRN: [id number] Date of Birth: [birth year]

## 2016-01-21 ENCOUNTER — Ambulatory Visit: Payer: Medicare Other | Admitting: Occupational Therapy

## 2016-01-21 DIAGNOSIS — M25542 Pain in joints of left hand: Secondary | ICD-10-CM | POA: Diagnosis not present

## 2016-01-21 DIAGNOSIS — M6281 Muscle weakness (generalized): Secondary | ICD-10-CM | POA: Diagnosis not present

## 2016-01-21 DIAGNOSIS — M25642 Stiffness of left hand, not elsewhere classified: Secondary | ICD-10-CM

## 2016-01-21 NOTE — Therapy (Signed)
[facility] [address]  [place], [place], [address] Phone: [phone]   Fax:  [phone]  Occupational Therapy Treatment  Patient Details  Name: Rose [name] MRN: [id number] Date of Birth: 1940-07-01 Referring Provider: Dr. Daryll Brod  Encounter Date: 01/21/2016      OT End of Session - 01/21/16 1320    Visit Number 3   Number of Visits 9   Date for OT Re-Evaluation 02/05/16   Authorization Type MCR - G Code needed   Authorization - Visit Number 3   Authorization - Number of Visits 10   OT Start Time 1240  pt arrived 10 min. late   OT Stop Time 1310   OT Time Calculation (min) 30 min   Activity Tolerance Patient tolerated treatment well      Past Medical History:  Diagnosis Date  . Abnormal blood pressure    left arm from old brachial artery repair  . Anemia   . Anxiety   . Atherosclerosis of renal artery ([place])   . Attention deficit disorder without mention of hyperactivity   . Basal cell carcinoma of face   . Benign heart murmur   . Carotid artery disease ([place])   . Carotid bruit    Left  . Centrilobular emphysema ([place])   . Chronic bronchitis ([place])   . Depression   . DJD (degenerative joint disease) of hip   . Esophageal reflux   . History of depression   . History of spinal fusion   . Hypertension   . Morton's neuroma    Hx of, Left  . Myalgia and myositis, unspecified   . Other tenosynovitis of hand and wrist   . Peripheral neuropathy ([place])   . Personal history of alcoholism ([place])   . Personal history of peptic ulcer disease   . Pulmonary nodule   . Rotator cuff disorder    pain  . Solitary cyst of breast   . Subclavian steal syndrome   . Unspecified hypothyroidism   . Wears glasses     Past Surgical History:  Procedure Laterality Date  . APPENDECTOMY    . CARPOMETACARPEL SUSPENSION PLASTY Right 08/07/2013   Procedure: CARPOMETACARPEL [facility]) SUSPENSION PLASTY RIGHT THUMB;   Surgeon: Wynonia Sours, MD;  Location: [place];  Service: Orthopedics;  Laterality: Right;  . CARPOMETACARPEL SUSPENSION PLASTY Left 10/23/2015   Procedure: SUSPENSION PLASTY LEFT THUMB TRAPEZIUM EXCISION ABDUCTOR POLLICIS LONGUS TRANSFER;  Surgeon: Daryll Brod, MD;  Location: [place];  Service: Orthopedics;  Laterality: Left;  . CERVICAL FUSION  2007   Dr. Valli Glance approach  . JOINT REPLACEMENT Left   . Left brachial artery repair of pseudoaneurysm  2001   post a cath  . LUMBAR FUSION  09/2004   T12-L5 (Dr. Patrice Paradise)  . MINOR IRRIGATION AND DEBRIDEMENT OF WOUND Right 08/27/2014   Procedure: MINOR IRRIGATION AND DEBRIDEMENT OF WOUND;  Surgeon: Daryll Brod, MD;  Location: [place];  Service: Orthopedics;  Laterality: Right;  . ORIF TIBIA FRACTURE  2010   Left distal  . Percutanous Transluminal Angioplasty of Renal Arteris    . REPAIR EXTENSOR TENDON Right 09/26/2014   Procedure: REPAIR EXTENSOR TENDON RIGHT RING FINGER ;  Surgeon: Daryll Brod, MD;  Location: [place];  Service: Orthopedics;  Laterality: Right;  . SHOULDER ARTHROSCOPY  09/2008   Left  . TONSILLECTOMY AND ADENOIDECTOMY    . TOTAL HIP ARTHROPLASTY  2008  left  . WISDOM TOOTH EXTRACTION      There were no vitals filed for this visit.      Subjective Assessment - 01/21/16 1244    Subjective  The ex's made me sore so I stopped doing them. (Pt instructed some soreness is to be expected, and not to quit, but maybe reduce amt of repetitions - pt agreed)    Pertinent History suspension plasty 10/23/15 Lt thumb, (same operation Rt thumb years ago)    Patient Stated Goals to use my Lt hand like my Rt pain free   Currently in Pain? Yes   Pain Score 3    Pain Location --  thumb   Pain Orientation Left   Pain Descriptors / Indicators Aching   Pain Type Chronic pain   Pain Onset More than a month ago   Pain Frequency Intermittent   Aggravating Factors  overuse    Pain Relieving Factors rest                      OT Treatments/Exercises (OP) - 01/21/16 0001      ADLs   ADL Comments Pt refused paraffin today stating "I need to get back home soon". Assessed current neoprene brace (to wear for comfort only) - still appropriate therefore did not issue a new one     Hand Exercises   Other Hand Exercises Reviewed HEP's - pt still required min to mod cues to perform correctly, but was able to do correctly after repetition   Other Hand Exercises Pt placing yellow to red resistance clothespins on antenna and removing for lateral pinch strength - pt with mild difficulty with red resistance                     OT Long Term Goals - 01/05/16 1340      OT LONG TERM GOAL #1   Title Independent with HEP (All LTG's due 02/05/16)    Time 4   Period Weeks   Status New     OT LONG TERM GOAL #2   Title Grip strength to be 23 lbs or greater Lt hand to assist with opening containers/jars   Baseline eval = 17 lbs (Rt = 30 lbs)   Time 4   Period Weeks   Status New     OT LONG TERM GOAL #3   Title Lateral and tip pinch Lt hand to increase by 3 lbs each for functional tasks   Baseline eval: lat = 1 lb (Rt = 9 lbs), tip = 2 lbs (Rt = 6.5 lbs)   Time 4   Period Weeks   Status New     OT LONG TERM GOAL #4   Title Pt to verbalize understanding with pain reduction strategies for Lt hand/thumb   Time 4   Period Weeks   Status New     OT LONG TERM GOAL #5   Title Pt to increase Lt MP thumb flexion to 40* or greater to make buttoning and tying shoes more efficient   Baseline eval = 35* (Rt = 48*)    Time 4   Period Weeks   Status New               Plan - 01/21/16 1321    Clinical Impression Statement Pt reports HEP making her thumb sore, but therapist explained this was to be expected since pt has not used thumb. Pt instructed to cut down repetitions if needed.  Rehab Potential Fair   Clinical Impairments Affecting  Rehab Potential CHRONIC   OT Frequency 2x / week   OT Duration 4 weeks   OT Treatment/Interventions Self-care/ADL training;Moist Heat;Fluidtherapy;DME and/or AE instruction;Splinting;Patient/family education;Therapeutic activities;Scar mobilization;Cryotherapy;Iontophoresis;Passive range of motion;Electrical Stimulation;Parrafin;Manual Therapy   Plan continue paraffin (if pt willing), gripper activity, continue clothespin activity   Consulted and Agree with Plan of Care Patient      Patient will benefit from skilled therapeutic intervention in order to improve the following deficits and impairments:  Decreased coordination, Decreased range of motion, Increased edema, Decreased activity tolerance, Decreased knowledge of use of DME, Impaired UE functional use, Pain, Decreased strength  Visit Diagnosis: Pain in joints of left hand  Muscle weakness (generalized)  Stiffness of left hand, not elsewhere classified    Problem List Patient Active Problem List   Diagnosis Date Noted  . Tensor fascia lata syndrome 03/04/2015  . Leg length discrepancy 10/29/2014  . Fecal incontinence 10/21/2014  . Muscle atrophy 06/18/2014  . Arthritis of left shoulder region 06/18/2014  . ADD (attention deficit disorder) 09/25/2013  . Encounter for Medicare annual wellness exam 08/02/2013  . Screening for osteoporosis 08/02/2013  . Depression 07/19/2013  . Hypothyroidism 07/19/2013  . Lung mass 07/19/2013  . COPD (chronic obstructive pulmonary disease) ([place]) 07/19/2013  . OTHER TENOSYNOVITIS OF HAND AND WRIST 11/26/2009  . Hereditary and idiopathic peripheral neuropathy 11/25/2009  . CAROTID ARTERY DISEASE 09/21/2008  . ALCOHOL ABUSE, HX OF 09/21/2008  . SUBCLAVIAN STEAL SYNDROME 09/20/2008  . Pain due to total hip replacement ([place]) 01/23/2008  . RENAL ARTERY STENOSIS 10/11/2007  . Malignant neoplasm of skin of parts of face 02/21/2007  . MORTON'S NEUROMA, LEFT 02/21/2007  . BREAST CYST 02/21/2007   . CAROTID BRUIT, LEFT 02/21/2007  . DUODENAL ULCER, HX OF 02/21/2007  . Other acquired absence of organ 02/21/2007  . CARCINOMA, BASAL CELL, FACE 02/21/2007  . Essential hypertension 08/09/2006  . GERD 08/09/2006  . HEART MURMUR, BENIGN 08/09/2006    Carey Bullocks, OTR/L 01/21/2016, 1:26 PM  [facility] [address]  [place], [place], [address] Phone: [phone]   Fax:  [phone]  Name: CRYSTALYN THAMES MRN: [id number] Date of Birth: 1940-10-07

## 2016-01-25 ENCOUNTER — Other Ambulatory Visit: Payer: Self-pay | Admitting: Medical

## 2016-01-25 DIAGNOSIS — I10 Essential (primary) hypertension: Secondary | ICD-10-CM

## 2016-01-26 ENCOUNTER — Ambulatory Visit: Payer: Medicare Other | Admitting: Occupational Therapy

## 2016-01-26 DIAGNOSIS — M25642 Stiffness of left hand, not elsewhere classified: Secondary | ICD-10-CM

## 2016-01-26 DIAGNOSIS — M6281 Muscle weakness (generalized): Secondary | ICD-10-CM | POA: Diagnosis not present

## 2016-01-26 DIAGNOSIS — M25542 Pain in joints of left hand: Secondary | ICD-10-CM

## 2016-01-26 NOTE — Therapy (Signed)
[facility] [address]  [place], [place], [address] Phone: [phone]   Fax:  [phone]  Occupational Therapy Treatment  Patient Details  Name: Rose [name] MRN: [id number] Date of Birth: [birth year] Referring Provider: Dr. Daryll Brod  Encounter Date: 01/26/2016      OT End of Session - 01/26/16 1326    Visit Number 4   Number of Visits 9   Date for OT Re-Evaluation 02/05/16   Authorization Type MCR - G Code needed   Authorization - Visit Number 4   Authorization - Number of Visits 10   OT Start Time 1240   OT Stop Time 1320   OT Time Calculation (min) 40 min   Activity Tolerance Patient tolerated treatment well      Past Medical History:  Diagnosis Date  . Abnormal blood pressure    left arm from old brachial artery repair  . Anemia   . Anxiety   . Atherosclerosis of renal artery ([place])   . Attention deficit disorder without mention of hyperactivity   . Basal cell carcinoma of face   . Benign heart murmur   . Carotid artery disease ([place])   . Carotid bruit    Left  . Centrilobular emphysema ([place])   . Chronic bronchitis ([place])   . Depression   . DJD (degenerative joint disease) of hip   . Esophageal reflux   . History of depression   . History of spinal fusion   . Hypertension   . Morton's neuroma    Hx of, Left  . Myalgia and myositis, unspecified   . Other tenosynovitis of hand and wrist   . Peripheral neuropathy ([place])   . Personal history of alcoholism ([place])   . Personal history of peptic ulcer disease   . Pulmonary nodule   . Rotator cuff disorder    pain  . Solitary cyst of breast   . Subclavian steal syndrome   . Unspecified hypothyroidism   . Wears glasses     Past Surgical History:  Procedure Laterality Date  . APPENDECTOMY    . CARPOMETACARPEL SUSPENSION PLASTY Right 08/07/2013   Procedure: CARPOMETACARPEL [facility]) SUSPENSION PLASTY RIGHT THUMB;  Surgeon: Wynonia Sours, MD;   Location: [place];  Service: Orthopedics;  Laterality: Right;  . CARPOMETACARPEL SUSPENSION PLASTY Left 10/23/2015   Procedure: SUSPENSION PLASTY LEFT THUMB TRAPEZIUM EXCISION ABDUCTOR POLLICIS LONGUS TRANSFER;  Surgeon: Daryll Brod, MD;  Location: [place];  Service: Orthopedics;  Laterality: Left;  . CERVICAL FUSION  2007   Dr. Valli Glance approach  . JOINT REPLACEMENT Left   . Left brachial artery repair of pseudoaneurysm  2001   post a cath  . LUMBAR FUSION  09/2004   T12-L5 (Dr. Patrice Paradise)  . MINOR IRRIGATION AND DEBRIDEMENT OF WOUND Right 08/27/2014   Procedure: MINOR IRRIGATION AND DEBRIDEMENT OF WOUND;  Surgeon: Daryll Brod, MD;  Location: [facility];  Service: Orthopedics;  Laterality: Right;  . ORIF TIBIA FRACTURE  2010   Left distal  . Percutanous Transluminal Angioplasty of Renal Arteris    . REPAIR EXTENSOR TENDON Right 09/26/2014   Procedure: REPAIR EXTENSOR TENDON RIGHT RING FINGER ;  Surgeon: Daryll Brod, MD;  Location: [facility];  Service: Orthopedics;  Laterality: Right;  . SHOULDER ARTHROSCOPY  09/2008   Left  . TONSILLECTOMY AND ADENOIDECTOMY    . TOTAL HIP ARTHROPLASTY  2008   left  . WISDOM TOOTH  EXTRACTION      There were no vitals filed for this visit.      Subjective Assessment - 01/26/16 1246    Subjective  The putty ex's are making my pain worse   Pertinent History suspension plasty 10/23/15 Lt thumb, (same operation Rt thumb years ago)    Patient Stated Goals to use my Lt hand like my Rt pain free   Currently in Pain? Yes   Pain Score 3    Pain Location --  thumb   Pain Orientation Left   Pain Descriptors / Indicators Aching                      OT Treatments/Exercises (OP) - 01/26/16 0001      ADLs   ADL Comments Discussed joint protection techniques, task modifications/adaptations and other pain management strategies for Lt thumb/wrist as well as general recommendations  for arthritis     Hand Exercises   Other Hand Exercises Reviewed putty HEP and given modifications to decr/eliminate pain at thumb including supporting CMC joint while performing lateral pinch. Discussed proper positioning for grip strength with putty and using fingers, not as much thumb   Other Hand Exercises Gripper set at 15 lbs resistance to pick up blocks Lt hand for sustained grip strength - pt with min drops/difficulty                     OT Long Term Goals - 01/26/16 1327      OT LONG TERM GOAL #1   Title Independent with HEP (All LTG's due 02/05/16)    Time 4   Period Weeks   Status Achieved     OT LONG TERM GOAL #2   Title Grip strength to be 23 lbs or greater Lt hand to assist with opening containers/jars   Baseline eval = 17 lbs (Rt = 30 lbs)   Time 4   Period Weeks   Status Achieved  01/26/16 = 24 lbs     OT LONG TERM GOAL #3   Title Lateral and tip pinch Lt hand to increase by 3 lbs each for functional tasks   Baseline eval: lat = 1 lb (Rt = 9 lbs), tip = 2 lbs (Rt = 6.5 lbs)   Time 4   Period Weeks   Status On-going     OT LONG TERM GOAL #4   Title Pt to verbalize understanding with pain reduction strategies for Lt hand/thumb   Time 4   Period Weeks   Status Achieved     OT LONG TERM GOAL #5   Title Pt to increase Lt MP thumb flexion to 40* or greater to make buttoning and tying shoes more efficient   Baseline eval = 35* (Rt = 48*)    Time 4   Period Weeks   Status On-going               Plan - 01/26/16 1328    Clinical Impression Statement Pt met LTG's #1, 2 and 4. Pt approximating remaining goals.    Rehab Potential Fair   Clinical Impairments Affecting Rehab Potential CHRONIC   OT Frequency 2x / week   OT Duration 4 weeks   OT Treatment/Interventions Self-care/ADL training;Moist Heat;Fluidtherapy;DME and/or AE instruction;Splinting;Patient/family education;Therapeutic activities;Scar  mobilization;Cryotherapy;Iontophoresis;Passive range of motion;Electrical Stimulation;Parrafin;Manual Therapy   Plan paraffin (for carryover at home), assess remaining LTG's and d/c next session   Consulted and Agree with Plan of Care Patient  Patient will benefit from skilled therapeutic intervention in order to improve the following deficits and impairments:  Decreased coordination, Decreased range of motion, Increased edema, Decreased activity tolerance, Decreased knowledge of use of DME, Impaired UE functional use, Pain, Decreased strength  Visit Diagnosis: Pain in joints of left hand  Muscle weakness (generalized)  Stiffness of left hand, not elsewhere classified    Problem List Patient Active Problem List   Diagnosis Date Noted  . Tensor fascia lata syndrome 03/04/2015  . Leg length discrepancy 10/29/2014  . Fecal incontinence 10/21/2014  . Muscle atrophy 06/18/2014  . Arthritis of left shoulder region 06/18/2014  . ADD (attention deficit disorder) 09/25/2013  . Encounter for Medicare annual wellness exam 08/02/2013  . Screening for osteoporosis 08/02/2013  . Depression 07/19/2013  . Hypothyroidism 07/19/2013  . Lung mass 07/19/2013  . COPD (chronic obstructive pulmonary disease) ([place]) 07/19/2013  . OTHER TENOSYNOVITIS OF HAND AND WRIST 11/26/2009  . Hereditary and idiopathic peripheral neuropathy 11/25/2009  . CAROTID ARTERY DISEASE 09/21/2008  . ALCOHOL ABUSE, HX OF 09/21/2008  . SUBCLAVIAN STEAL SYNDROME 09/20/2008  . Pain due to total hip replacement ([place]) 01/23/2008  . RENAL ARTERY STENOSIS 10/11/2007  . Malignant neoplasm of skin of parts of face 02/21/2007  . MORTON'S NEUROMA, LEFT 02/21/2007  . BREAST CYST 02/21/2007  . CAROTID BRUIT, LEFT 02/21/2007  . DUODENAL ULCER, HX OF 02/21/2007  . Other acquired absence of organ 02/21/2007  . CARCINOMA, BASAL CELL, FACE 02/21/2007  . Essential hypertension 08/09/2006  . GERD 08/09/2006  . HEART MURMUR, BENIGN  08/09/2006    Carey Bullocks, OTR/L 01/26/2016, 1:30 PM  [facility] [address]  [place], [place], [address] Phone: [phone]   Fax:  [phone]  Name: Rose [name] MRN: [id number] Date of Birth: 1940-09-05

## 2016-01-28 ENCOUNTER — Ambulatory Visit: Payer: Medicare Other | Admitting: Occupational Therapy

## 2016-01-28 DIAGNOSIS — M6281 Muscle weakness (generalized): Secondary | ICD-10-CM | POA: Diagnosis not present

## 2016-01-28 DIAGNOSIS — M25642 Stiffness of left hand, not elsewhere classified: Secondary | ICD-10-CM | POA: Diagnosis not present

## 2016-01-28 DIAGNOSIS — M25542 Pain in joints of left hand: Secondary | ICD-10-CM

## 2016-01-28 NOTE — Therapy (Signed)
[facility] [address]  [name], [place], [address] Phone: [phone]   Fax:  [phone]  Occupational Therapy Treatment  Patient Details  Name: Rose [name] MRN: [id number] Date of Birth: 08-07-1940 Referring Provider: Dr. Daryll Brod  Encounter Date: 01/28/2016      OT End of Session - 01/28/16 1323    Visit Number 5   Number of Visits 9   Date for OT Re-Evaluation 02/05/16   Authorization Type MCR - G Code needed   Authorization - Visit Number 5   Authorization - Number of Visits 10   OT Start Time 1240   OT Stop Time 1315   OT Time Calculation (min) 35 min   Equipment Utilized During Treatment paraffin   Activity Tolerance Patient tolerated treatment well      Past Medical History:  Diagnosis Date  . Abnormal blood pressure    left arm from old brachial artery repair  . Anemia   . Anxiety   . Atherosclerosis of renal artery ([place])   . Attention deficit disorder without mention of hyperactivity   . Basal cell carcinoma of face   . Benign heart murmur   . Carotid artery disease ([place])   . Carotid bruit    Left  . Centrilobular emphysema ([place])   . Chronic bronchitis ([place])   . Depression   . DJD (degenerative joint disease) of hip   . Esophageal reflux   . History of depression   . History of spinal fusion   . Hypertension   . Morton's neuroma    Hx of, Left  . Myalgia and myositis, unspecified   . Other tenosynovitis of hand and wrist   . Peripheral neuropathy ([place])   . Personal history of alcoholism ([place])   . Personal history of peptic ulcer disease   . Pulmonary nodule   . Rotator cuff disorder    pain  . Solitary cyst of breast   . Subclavian steal syndrome   . Unspecified hypothyroidism   . Wears glasses     Past Surgical History:  Procedure Laterality Date  . APPENDECTOMY    . CARPOMETACARPEL SUSPENSION PLASTY Right 08/07/2013   Procedure: CARPOMETACARPEL [facility]) SUSPENSION  PLASTY RIGHT THUMB;  Surgeon: Wynonia Sours, MD;  Location: [facility];  Service: Orthopedics;  Laterality: Right;  . CARPOMETACARPEL SUSPENSION PLASTY Left 10/23/2015   Procedure: SUSPENSION PLASTY LEFT THUMB TRAPEZIUM EXCISION ABDUCTOR POLLICIS LONGUS TRANSFER;  Surgeon: Daryll Brod, MD;  Location: [facility];  Service: Orthopedics;  Laterality: Left;  . CERVICAL FUSION  2007   Dr. Valli Glance approach  . JOINT REPLACEMENT Left   . Left brachial artery repair of pseudoaneurysm  2001   post a cath  . LUMBAR FUSION  09/2004   T12-L5 (Dr. Patrice Paradise)  . MINOR IRRIGATION AND DEBRIDEMENT OF WOUND Right 08/27/2014   Procedure: MINOR IRRIGATION AND DEBRIDEMENT OF WOUND;  Surgeon: Daryll Brod, MD;  Location: [facility];  Service: Orthopedics;  Laterality: Right;  . ORIF TIBIA FRACTURE  2010   Left distal  . Percutanous Transluminal Angioplasty of Renal Arteris    . REPAIR EXTENSOR TENDON Right 09/26/2014   Procedure: REPAIR EXTENSOR TENDON RIGHT RING FINGER ;  Surgeon: Daryll Brod, MD;  Location: [facility];  Service: Orthopedics;  Laterality: Right;  . SHOULDER ARTHROSCOPY  09/2008   Left  . TONSILLECTOMY AND ADENOIDECTOMY    . TOTAL HIP ARTHROPLASTY  2008  left  . WISDOM TOOTH EXTRACTION      There were no vitals filed for this visit.      Subjective Assessment - 01/28/16 1246    Subjective  I'm treating my thumb as it's normal and it seems to be working (I just don't force anything)    Pertinent History suspension plasty 10/23/15 Lt thumb, (same operation Rt thumb years ago)    Patient Stated Goals to use my Lt hand like my Rt pain free   Currently in Pain? No/denies            [facility] OT Assessment - 01/28/16 0001      Left Hand AROM   L Thumb MCP 0-60 40 Degrees     Hand Function   Left Hand Grip (lbs) 24 lbs   Left Hand Lateral Pinch 3 lbs   Left 3 point pinch 3 lbs                  OT Treatments/Exercises  (OP) - 01/28/16 0001      ADLs   ADL Comments Assessed remaining goals and progress to date. Reviewed proper positioning Lt thumb and task modifications      Hand Exercises   Other Hand Exercises Gripper set at 15 lbs resistance to pick up blocks Lt hand for sustained grip strength - pt with min drops/difficulty     LUE Paraffin   Number Minutes Paraffin 10 Minutes   LUE Paraffin Location Hand;Wrist   Comments to decr. stiffness and for education for home use  (Pt has home paraffin but has not used in a long time)                     OT Long Term Goals - 01/28/16 1324      OT LONG TERM GOAL #1   Title Independent with HEP (All LTG's due 02/05/16)    Time 4   Period Weeks   Status Achieved     OT LONG TERM GOAL #2   Title Grip strength to be 23 lbs or greater Lt hand to assist with opening containers/jars   Baseline eval = 17 lbs (Rt = 30 lbs)   Time 4   Period Weeks   Status Achieved  01/26/16 = 24 lbs     OT LONG TERM GOAL #3   Title Lateral and tip pinch Lt hand to increase by 3 lbs each for functional tasks   Baseline eval: lat = 1 lb (Rt = 9 lbs), tip = 2 lbs (Rt = 6.5 lbs)   Time 4   Period Weeks   Status Not Met  01/28/16: Lt lateral pinch = 3 lbs, tip = 3 lbs     OT LONG TERM GOAL #4   Title Pt to verbalize understanding with pain reduction strategies for Lt hand/thumb   Time 4   Period Weeks   Status Achieved     OT LONG TERM GOAL #5   Title Pt to increase Lt MP thumb flexion to 40* or greater to make buttoning and tying shoes more efficient   Baseline eval = 35* (Rt = 48*)    Time 4   Period Weeks   Status Achieved  01/28/16 = 40 degrees               Plan - 01/28/16 1325    Clinical Impression Statement Pt met all LTG's except pinch strength, however pinch strength did improve. Pt has reached max potential  at this time and has necessary HEP's and education to maintain strength and decr. pain Lt thumb   Plan D/C O.T. at this time    Consulted and Agree with Plan of Care Patient      Patient will benefit from skilled therapeutic intervention in order to improve the following deficits and impairments:     Visit Diagnosis: Pain in joints of left hand  Muscle weakness (generalized)  Stiffness of left hand, not elsewhere classified      G-Codes - 02/13/16 1327    Functional Assessment Tool Used Lt grip = 24 lbs, Lt lateral pinch = 3 lbs, pain 0-3/10    Functional Limitation Carrying, moving and handling objects   Carrying, Moving and Handling Objects Goal Status ([id number]) At least 20 percent but less than 40 percent impaired, limited or restricted   Carrying, Moving and Handling Objects Discharge Status [phone]) At least 20 percent but less than 40 percent impaired, limited or restricted      Problem List Patient Active Problem List   Diagnosis Date Noted  . Tensor fascia lata syndrome 03/04/2015  . Leg length discrepancy 10/29/2014  . Fecal incontinence 10/21/2014  . Muscle atrophy 06/18/2014  . Arthritis of left shoulder region 06/18/2014  . ADD (attention deficit disorder) 09/25/2013  . Encounter for Medicare annual wellness exam 08/02/2013  . Screening for osteoporosis 08/02/2013  . Depression 07/19/2013  . Hypothyroidism 07/19/2013  . Lung mass 07/19/2013  . COPD (chronic obstructive pulmonary disease) ([place]) 07/19/2013  . OTHER TENOSYNOVITIS OF HAND AND WRIST 11/26/2009  . Hereditary and idiopathic peripheral neuropathy 11/25/2009  . CAROTID ARTERY DISEASE 09/21/2008  . ALCOHOL ABUSE, HX OF 09/21/2008  . SUBCLAVIAN STEAL SYNDROME 09/20/2008  . Pain due to total hip replacement ([place]) 01/23/2008  . RENAL ARTERY STENOSIS 10/11/2007  . Malignant neoplasm of skin of parts of face 02/21/2007  . MORTON'S NEUROMA, LEFT 02/21/2007  . BREAST CYST 02/21/2007  . CAROTID BRUIT, LEFT 02/21/2007  . DUODENAL ULCER, HX OF 02/21/2007  . Other acquired absence of organ 02/21/2007  . CARCINOMA, BASAL CELL, FACE  02/21/2007  . Essential hypertension 08/09/2006  . GERD 08/09/2006  . HEART MURMUR, BENIGN 08/09/2006    OCCUPATIONAL THERAPY DISCHARGE SUMMARY  Visits from Start of Care: 5  Current functional level related to goals / functional outcomes: SEE ABOVE   Remaining deficits: Strength Lt hand/thumb Mild pain Lt thumb   Education / Equipment: HEP's, education re: splint wear and care, task modifications, adaptive strategies to decr. pain and protect joints  Plan: Patient agrees to discharge.  Patient goals were met. Patient is being discharged due to being pleased with the current functional level.  ?????       Carey Bullocks, OTR/L 02-13-2016, 1:28 PM  [facility] [address]  [place], [place], [address] Phone: [phone]   Fax:  [phone]  Name: CAREE WOLPERT MRN: [id number] Date of Birth: [birth year]

## 2016-01-30 DIAGNOSIS — M1812 Unilateral primary osteoarthritis of first carpometacarpal joint, left hand: Secondary | ICD-10-CM | POA: Diagnosis not present

## 2016-02-02 ENCOUNTER — Ambulatory Visit: Payer: Medicare Other | Admitting: Occupational Therapy

## 2016-02-07 ENCOUNTER — Other Ambulatory Visit: Payer: Self-pay | Admitting: Medical

## 2016-02-08 ENCOUNTER — Other Ambulatory Visit: Payer: Self-pay | Admitting: Medical

## 2016-02-09 ENCOUNTER — Ambulatory Visit: Payer: Medicare Other | Admitting: Occupational Therapy

## 2016-02-09 IMAGING — CT CT HIP*L* W/O CM
2 of 3 series · 17 of 46 positions shown, 19 images · non-contrast
Comparison: PET-CT 08/14/2013.

CLINICAL DATA: LEFT hip pain. LEFT hip arthritis. Loosening of rip
replacement

EXAM:
CT OF THE LEFT HIP WITHOUT CONTRAST
TECHNIQUE: Multidetector CT imaging of the left hip was performed according to
the standard protocol. Multiplanar CT image reconstructions were
also generated.

[Series 5: hip soft · axial · 0.41mm/px · z∈[-318,-55]mm · 14 of 121 slices shown, 16 images]
[im 8/121  soft-tissue]
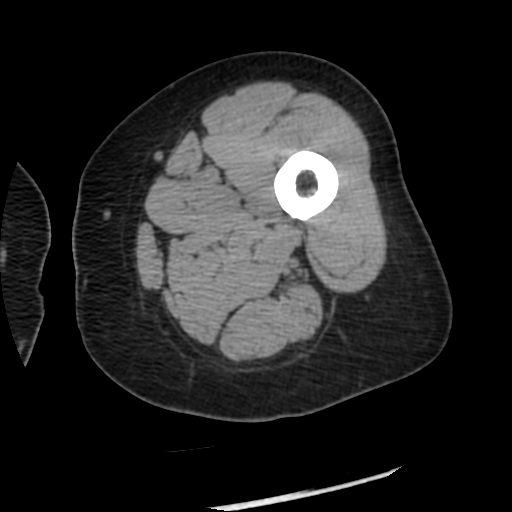
[im 8/121  bone]
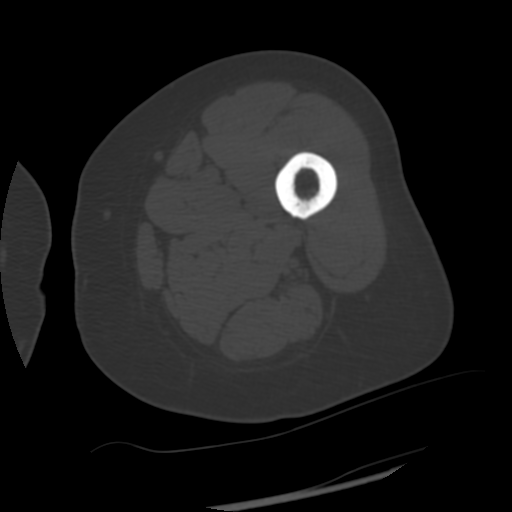
[im 16/121  soft-tissue]
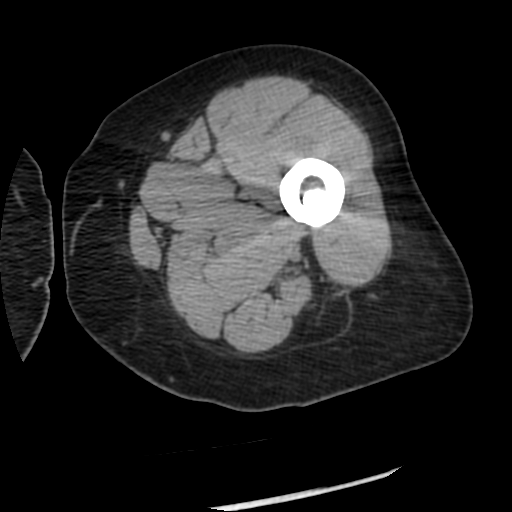
[im 24/121  soft-tissue]
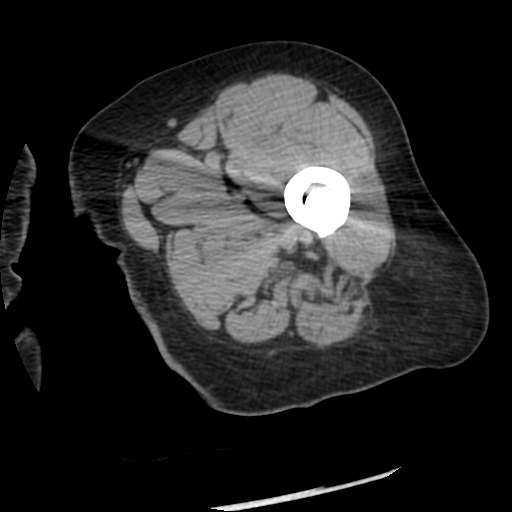
[im 31/121  soft-tissue]
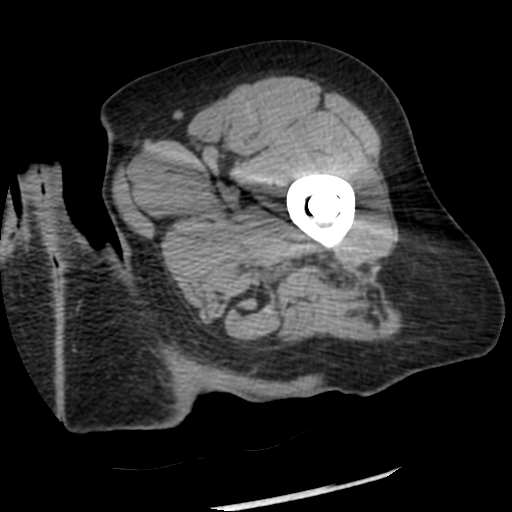
[im 39/121  soft-tissue]
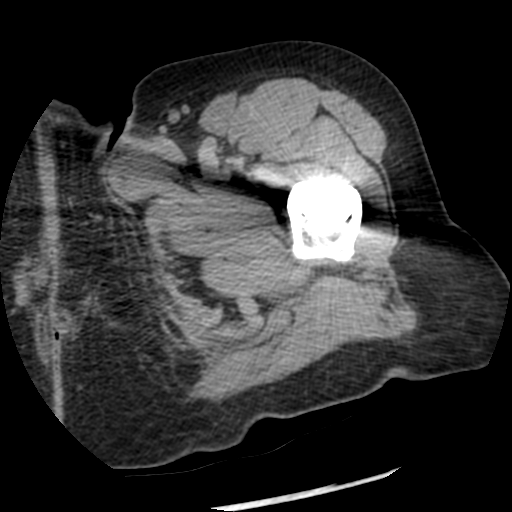
[im 47/121  soft-tissue]
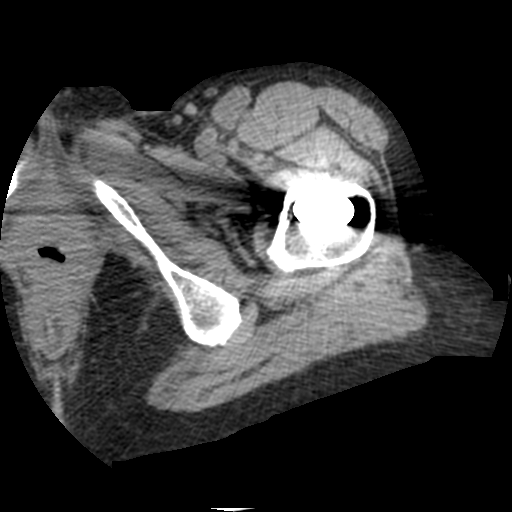
[im 55/121  soft-tissue]
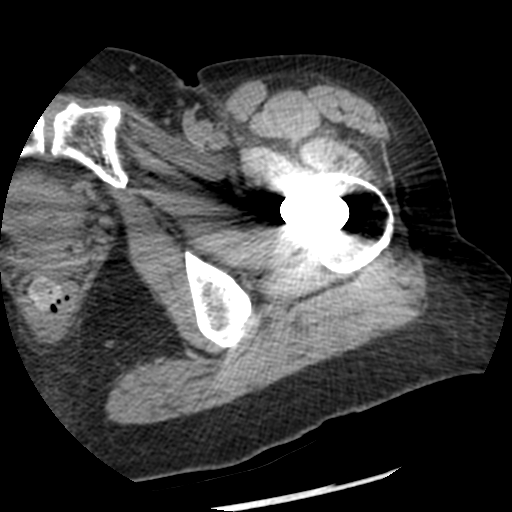
[im 66/121  soft-tissue]
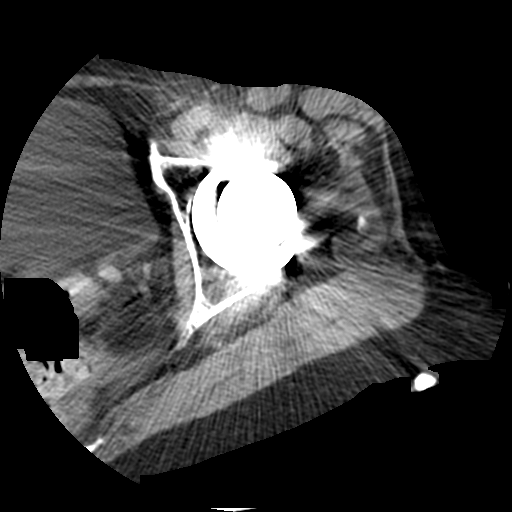
[im 74/121  soft-tissue]
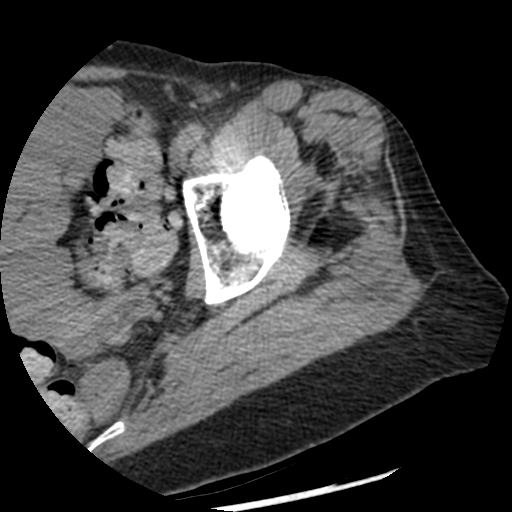
[im 74/121  bone]
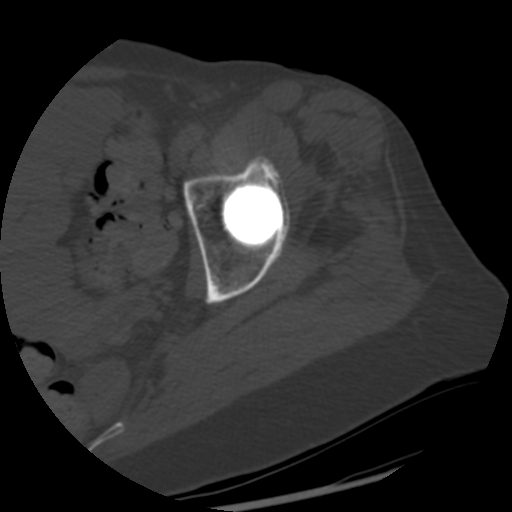
[im 82/121  soft-tissue]
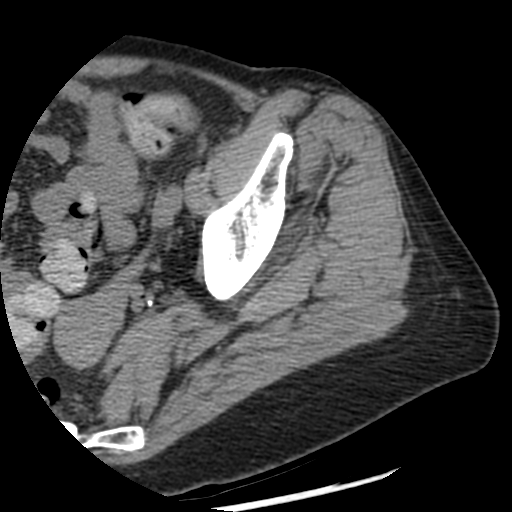
[im 90/121  soft-tissue]
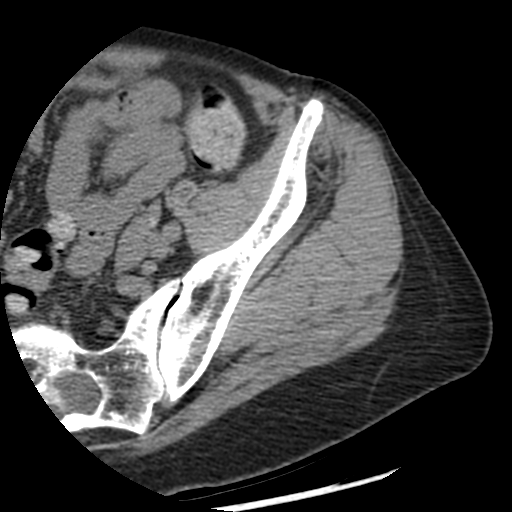
[im 97/121  soft-tissue]
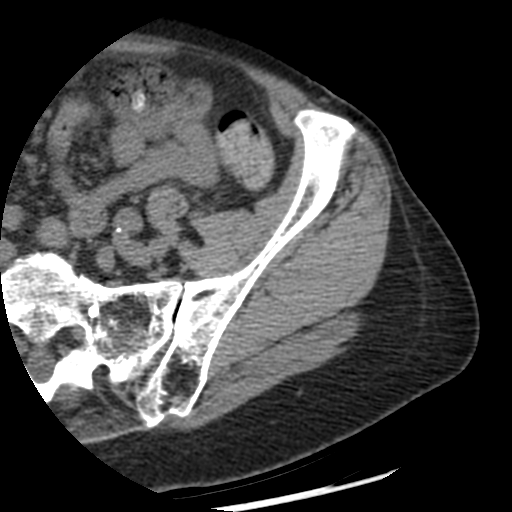
[im 105/121  soft-tissue]
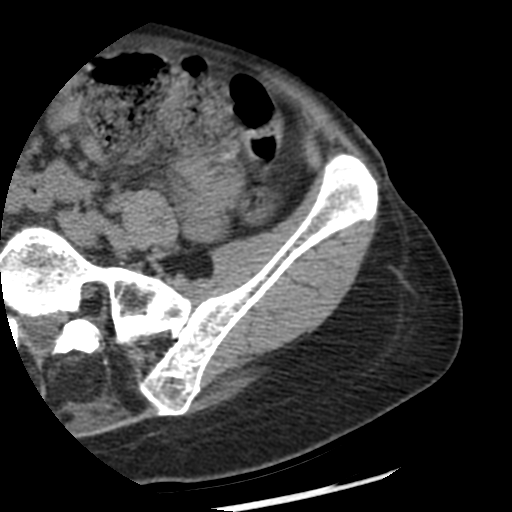
[im 113/121  soft-tissue]
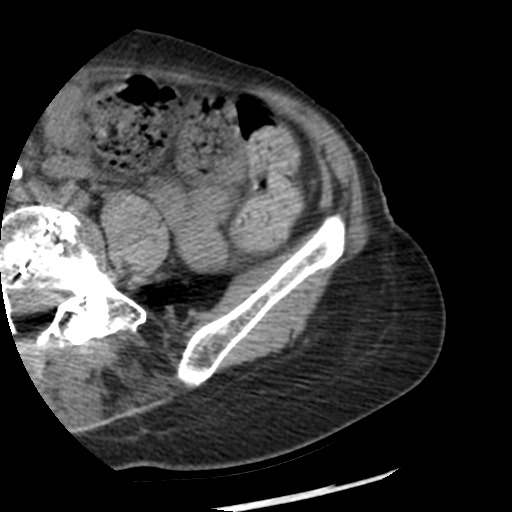

[Series 300: cor soft · coronal · 0.61mm/px · 3 of 89 slices shown]
[im 30/89  soft-tissue]
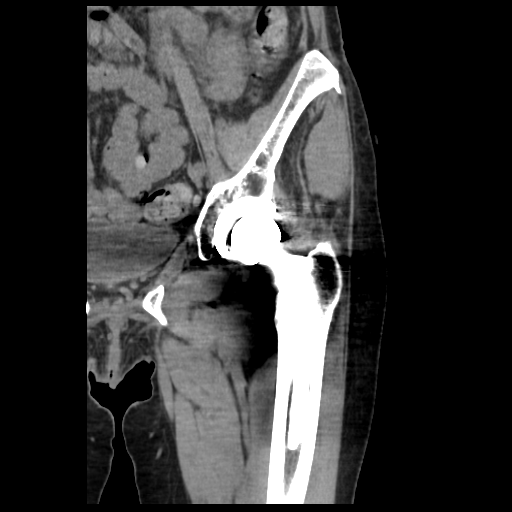
[im 40/89  soft-tissue]
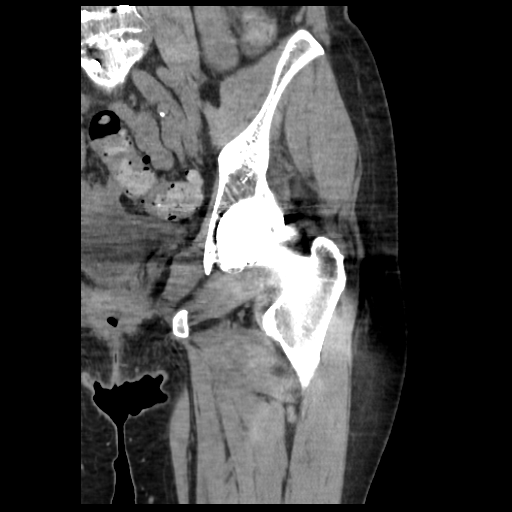
[im 49/89  soft-tissue]
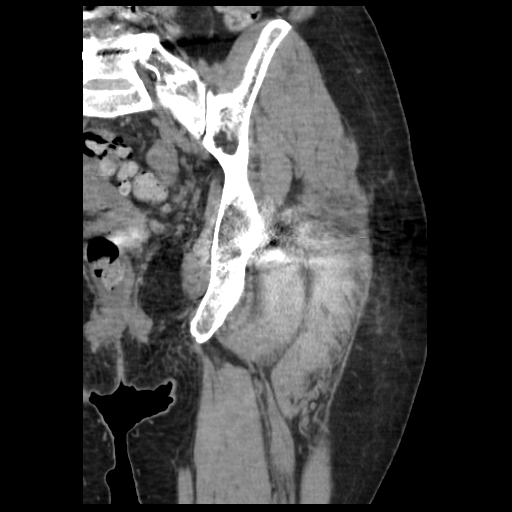

[17 of 46 positions shown; findings below may reference images not displayed]

FINDINGS: The LEFT total hip arthroplasty is intact. There is no evidence of
loosening of the femoral or acetabular component. Of the short
external rotators and gluteal muscles appear intact. Mild
atherosclerosis. Lumbar spine fixation hardware is noted along with
sacral Tarlov cysts. Radiolucent lesion in the medial LEFT iliac
bone is unchanged from prior PET-CT and shows fatty central
attenuation. This may represent a bone graft harvesting site. Distal
stem of the femoral component is visible and appears normal.
IMPRESSION: No evidence of loosening or complicating features of the LEFT total
hip arthroplasty.

## 2016-02-10 NOTE — Telephone Encounter (Signed)
Medication filled to pharmacy as requested.   

## 2016-02-11 ENCOUNTER — Ambulatory Visit: Payer: Medicare Other | Admitting: Occupational Therapy

## 2016-03-09 ENCOUNTER — Other Ambulatory Visit: Payer: Self-pay | Admitting: Medical

## 2016-03-09 ENCOUNTER — Other Ambulatory Visit: Payer: Self-pay | Admitting: Physician Assistant

## 2016-03-11 ENCOUNTER — Other Ambulatory Visit: Payer: Self-pay

## 2016-03-11 NOTE — Telephone Encounter (Addendum)
Would you clarify how pt is taking neurontin. In epic [facility] rx tid. But most recent request stated qid. Will you call pharmacy for clarification. Will you update me and then I will write refill

## 2016-03-11 NOTE — Telephone Encounter (Signed)
So it looks like she is on tid of neurontin and not qid. You sent in rx today? Is this what pharmacy advised?

## 2016-03-12 ENCOUNTER — Telehealth: Payer: Self-pay | Admitting: Medical

## 2016-03-12 NOTE — Telephone Encounter (Signed)
Patient called to say he is no longer taking Wellbutrin. Also states that her BP has been running in the [id number] and [id number] Systolic. Would like to know if you want her ti increase her Valsartan. Also wants to inform you that the medication she takes for Bipolar is Zyprexa.

## 2016-03-12 NOTE — Telephone Encounter (Signed)
Called patient with new order for Amlodipine. Advised of other orders as well. Patient voiced understanding. Patient has appointment scheduled for next  Tuesday for BP follow up.

## 2016-03-12 NOTE — Telephone Encounter (Signed)
Got the request to refill wellbutrin. I ok'd the rx. But saw on prior med list said was not taking. So I want to know if anything has changed with her mood. Is she recently depressed or has she been taking and our record of her not taking med was wrong?

## 2016-03-12 NOTE — Telephone Encounter (Signed)
If her bp are running [id number] systolic have her take 10 mg of the amlodipine( currently on 5 mg). Continue valsartan the same. Clonidine is also on her list. Make sure she does not skip dose of clonidine. If clonidine dose missed she can get rebound htn from missed dose. So take clonidine as directed. Schedule he for appointment on this coming Tuesday. I want to check her bp. Also want to discuss her bipolar treatment. Who was Dr. Parks Ranger recommended wellbutrin(see Mariane Duval note?) She can not take wellbutrin presently until we discuss further.  If bp not well controlled over long holiday weekend then ED evaluation.

## 2016-03-12 NOTE — Telephone Encounter (Signed)
Called to discuss medication with patient.  Pt states she has been seeing a physician for ADHD.  It turns out patient's symptoms were not related to ADHD, but more so manic depression.  She was advised to restart bupropion, hence the request for refill.  She said provider took her off a medication, but she can't remember the name of it.  She said she will call pharmacy and call back.

## 2016-03-16 ENCOUNTER — Ambulatory Visit (INDEPENDENT_AMBULATORY_CARE_PROVIDER_SITE_OTHER): Payer: Medicare Other | Admitting: Medical

## 2016-03-16 ENCOUNTER — Encounter: Payer: Self-pay | Admitting: Medical

## 2016-03-16 VITALS — BP 118/60 | HR 67 | Temp 97.7°F | Resp 16 | Ht 63.0 in | Wt 120.5 lb

## 2016-03-16 DIAGNOSIS — I10 Essential (primary) hypertension: Secondary | ICD-10-CM

## 2016-03-16 DIAGNOSIS — F988 Other specified behavioral and emotional disorders with onset usually occurring in childhood and adolescence: Secondary | ICD-10-CM | POA: Diagnosis not present

## 2016-03-16 NOTE — Progress Notes (Signed)
Subjective:    Patient ID: Rose [name], female    DOB: March 03, 1941, 76 y.o.   MRN: [id number]  HPI   Pt in for follow up.   Pt is now seeing new psychiatric provider Lauris Poag NP. Pt was given zyprexa. She states it helped her a lot. Her mood is much better presently.   Pt used to see Dr. Nolen Mu.(may be collaberating physician for Lauris Poag).   Pt states she has ADD history . Pt states for years had ADD. Pt states in past was on ritalin. But this raised her blood pressure.   Pt used to be on Wellbutrin for years but not on currenltly. Pt is still on cymbalta.  Pt has in past used straterra in the past for ADD. She states when on both did not have any side effect.  . Pt thought her attention was good back then .  Pt is planning to attend [employer] [facility] [employer] counseling. She is trying to get 3 course certificate. She request a note that would provide her extended time to take test and she request test be in short essay form. This is 8 week program.  Pt blood pressure reading is good today.  Pt blood pressure the other day was elevated systolic 150-160 for about 5-7 days consistently. I got call late Friday and advised increase norvasc to 10 mg. Pt bp this am was 115/50. I got 118/60 today.   Review of Systems  Constitutional: Negative for chills, fatigue and fever.  Respiratory: Negative for cough, chest tightness, shortness of breath and wheezing.   Cardiovascular: Negative for chest pain and palpitations.  Gastrointestinal: Negative for abdominal pain.  Musculoskeletal: Negative for back pain.  Skin: Negative for rash.  Neurological: Negative for dizziness, speech difficulty, weakness, numbness and headaches.  Hematological: Negative for adenopathy. Does not bruise/bleed easily.  Psychiatric/Behavioral: Positive for decreased concentration. Negative for behavioral problems, confusion, dysphoric mood, hallucinations and sleep disturbance. The patient  is not nervous/anxious.        Mood is well controlled.    Past Medical History:  Diagnosis Date  . Abnormal blood pressure    left arm from old brachial artery repair  . Anemia   . Anxiety   . Atherosclerosis of renal artery (HCC)   . Attention deficit disorder without mention of hyperactivity   . Basal cell carcinoma of face   . Benign heart murmur   . Carotid artery disease (HCC)   . Carotid bruit    Left  . Centrilobular emphysema (HCC)   . Chronic bronchitis (HCC)   . Depression   . DJD (degenerative joint disease) of hip   . Esophageal reflux   . History of depression   . History of spinal fusion   . Hypertension   . Morton's neuroma    Hx of, Left  . Myalgia and myositis, unspecified   . Other tenosynovitis of hand and wrist   . Peripheral neuropathy (HCC)   . Personal history of alcoholism (HCC)   . Personal history of peptic ulcer disease   . Pulmonary nodule   . Rotator cuff disorder    pain  . Solitary cyst of breast   . Subclavian steal syndrome   . Unspecified hypothyroidism   . Wears glasses      Social History   Social History  . Marital status: Single    Spouse name: N/A  . Number of children: Y  . Years of education: N/A  Occupational History  . retired Retired    IT trainer, had own firm   Social History Main Topics  . Smoking status: Former Smoker    Packs/day: 1.00    Years: 25.00    Types: Cigarettes    Quit date: 03/16/1983  . Smokeless tobacco: Never Used  . Alcohol use Yes     Comment: 2-3 glasses wine daily  . Drug use: No  . Sexual activity: Not on file   Other Topics Concern  . Not on file   Social History Narrative   [employer]-accounting      Married '60-41yr divorced; '84-2 years, divorced      3 daughters- '61, '70, '71; 2 sons-'53,'55 (schizophrenic-died); 10 grandchildren      Lives alone with 1 dog and 3 cats      Pt unsure of family history- was adopted.                   Past Surgical History:    Procedure Laterality Date  . APPENDECTOMY    . CARPOMETACARPEL SUSPENSION PLASTY Right 08/07/2013   Procedure: CARPOMETACARPEL [facility]) SUSPENSION PLASTY RIGHT THUMB;  Surgeon: Nicki Reaper, MD;  Location: [facility];  Service: Orthopedics;  Laterality: Right;  . CARPOMETACARPEL SUSPENSION PLASTY Left 10/23/2015   Procedure: SUSPENSION PLASTY LEFT THUMB TRAPEZIUM EXCISION ABDUCTOR POLLICIS LONGUS TRANSFER;  Surgeon: Cindee Salt, MD;  Location: [facility];  Service: Orthopedics;  Laterality: Left;  . CERVICAL FUSION  2007   Dr. Maylene Roes approach  . JOINT REPLACEMENT Left   . Left brachial artery repair of pseudoaneurysm  2001   post a cath  . LUMBAR FUSION  09/2004   T12-L5 (Dr. Noel Gerold)  . MINOR IRRIGATION AND DEBRIDEMENT OF WOUND Right 08/27/2014   Procedure: MINOR IRRIGATION AND DEBRIDEMENT OF WOUND;  Surgeon: Cindee Salt, MD;  Location: [facility];  Service: Orthopedics;  Laterality: Right;  . ORIF TIBIA FRACTURE  2010   Left distal  . Percutanous Transluminal Angioplasty of Renal Arteris    . REPAIR EXTENSOR TENDON Right 09/26/2014   Procedure: REPAIR EXTENSOR TENDON RIGHT RING FINGER ;  Surgeon: Cindee Salt, MD;  Location: [facility];  Service: Orthopedics;  Laterality: Right;  . SHOULDER ARTHROSCOPY  09/2008   Left  . TONSILLECTOMY AND ADENOIDECTOMY    . TOTAL HIP ARTHROPLASTY  2008   left  . WISDOM TOOTH EXTRACTION      Family History  Problem Relation Age of Onset  . Adopted: Yes  . Schizophrenia Son     Deceased 72  . Hypertension Daughter     Renal  . Healthy Son     x1  . Healthy Daughter     x2    Allergies  Allergen Reactions  . Amoxicillin     REACTION: Rash  . Codeine   . Hydrocodone-Acetaminophen     REACTION: itching  . Hydromorphone Hcl   . Morphine And Related Hives and Itching  . Morphine Sulfate     REACTION: unspecified  . Nsaids     Ulcer    Current Outpatient Prescriptions on File  Prior to Visit  Medication Sig Dispense Refill  . alendronate (FOSAMAX) 70 MG tablet Take 1 tablet (70 mg total) by mouth every 7 (seven) days. Take with a full glass of water on an empty stomach. 4 tablet 11  . amLODipine (NORVASC) 5 MG tablet TAKE ONE TABLET BY MOUTH DAILY (Patient taking differently: TAKE TWO (10 MG  TOTAL) TABLET BY MOUTH DAILY) 90 tablet 0  . aspirin 81 MG tablet Take 81 mg by mouth daily.     . cloNIDine (CATAPRES) 0.1 MG tablet TAKE ONE TABLET BY MOUTH TWICE A DAY 60 tablet 3  . DULoxetine (CYMBALTA) 60 MG capsule TAKE ONE CAPSULE BY MOUTH DAILY 30 capsule 5  . ferrous sulfate dried (SLOW FE) 160 (50 FE) MG TBCR Take 160 mg by mouth 2 (two) times daily. Patient has been taking Once Daily    . gabapentin (NEURONTIN) 600 MG tablet TAKE ONE TABLET BY MOUTH THREE TIMES A DAY (Patient taking differently: TAKE ONE TABLET THREE TIMES DAILY AS NEEDED) 90 tablet 4  . Glucosamine 500 MG TABS Take 1 tablet by mouth 2 (two) times daily. Glucosamine-Chondronton    . levothyroxine (SYNTHROID, LEVOTHROID) 112 MCG tablet TAKE 1 TABLET (112 MCG TOTAL) BY MOUTH DAILY BEFORE BREAKFAST. 30 tablet 3  . valsartan (DIOVAN) 80 MG tablet TAKE TWO TABLETS BY MOUTH DAILY 60 tablet 1   No current facility-administered medications on file prior to visit.     BP 118/60   Pulse 67   Temp 97.7 F (36.5 C) (Oral)   Resp 16   Ht 5\' 3"  (1.6 m)   Wt 120 lb 8 oz (54.7 kg)   SpO2 99%   BMI 21.35 kg/m       Objective:   Physical Exam   General Mental Status- Alert. General Appearance- Not in acute distress.   Skin General: Color- Normal Color. Moisture- Normal Moisture.  Neck Carotid Arteries- Normal color. Moisture- Normal Moisture. No carotid bruits. No JVD.  Chest and Lung Exam Auscultation: Breath Sounds:-Normal.  Cardiovascular Auscultation:Rythm- Regular. Murmurs & Other Heart Sounds:Auscultation of the heart reveals- No Murmurs.  Abdomen Inspection:-Inspeection  Normal. Palpation/Percussion:Note:No mass. Palpation and Percussion of the abdomen reveal- Non Tender, Non Distended + BS, no rebound or guarding.   Neurologic Cranial Nerve exam:- CN III-XII intact(No nystagmus), symmetric smile. Strength:- 5/5 equal and symmetric strength both upper and lower extremities.     Assessment & Plan:  Your bp is well controlled today. I want you to continue current regimen. Check bp daily. If with recent increase your systolic drops close to 100 then get back on the 5 mg dose amlodipine instead of current 10 mg.  I am writing your note for your university program to allow you more time for testing and to allow you to  give answer short essay form as you request.  I want you to talk with your psychiatrist or psychiatric NP on options that are nonstimulant in light of your cymbalta use and desrire to be on straterra as these are similar. And guanfesin as option but you are on clonidine.  Follow up in 3 months or as needed  Henrique Parekh, Ramon Dredge, [employer]

## 2016-03-16 NOTE — Telephone Encounter (Signed)
Pt currently in office to see Percell Miller.

## 2016-03-16 NOTE — Patient Instructions (Addendum)
Your bp is well controlled today. I want you to continue current regimen. Check bp daily. If with recent increase your systolic drops close to [id number] then get back on the 5 mg dose amlodipine instead of current 10 mg.  I am writing your note for your university program to allow you more time for testing and to give answer short essay form as you request  I wan you to talk with your psychiatrist or psychiatric NP on options that are nonstimulant in light of your cymbalta use and desire to be on straterra as these are similar. And guanfesin as option but you are on clonidine.  Follow up in 3 months or as needed

## 2016-03-16 NOTE — Progress Notes (Signed)
Pre visit review using our clinic review tool, if applicable. No additional management support is needed unless otherwise documented below in the visit note/SLS  

## 2016-03-17 ENCOUNTER — Telehealth: Payer: Self-pay | Admitting: Medical

## 2016-03-17 NOTE — Telephone Encounter (Signed)
Pt says is requesting a call back. Pt wouldn't say why the call back is needed. She says that she don't want to get her concern mis-communicated.    Advised pt that I could definitely relay message. She says that she would rather voice her concern once.

## 2016-03-19 NOTE — Telephone Encounter (Signed)
Patient states that her Therapist/Psychiatrist that treats her Bipolar has moved to a new office and is stating that patient must have a 2-day testing to see if she qualifies for updated ADD/ADHD diagnosis, as she is requesting to start Strattera Rx; reports she is taking Photographer and does not have timeto do this testing. Pt states that she was on medication for ADD prior and has to stop because of her BP. Informed patient that this is the standard procedure and that she would have to follow through with Psych and the testing required prior to receiving medication for ADD. Gave patient the option of seeing one of [facility] providers; pt agreed and contact information given via telephone/SLS 01/05

## 2016-03-22 ENCOUNTER — Telehealth: Payer: Self-pay | Admitting: Medical

## 2016-03-22 DIAGNOSIS — I10 Essential (primary) hypertension: Secondary | ICD-10-CM

## 2016-03-22 NOTE — Telephone Encounter (Signed)
Caller name: Relationship to patient: Self Can be reached:  [phone]  Pharmacy:  Reason for call: Patient called requesting a call back to discuss an increase in her BP. States it is 155/84 today and she thinks she needs to increase her medication. Plse adv

## 2016-03-22 NOTE — Telephone Encounter (Signed)
Patient has been informed numerous times with prior provider that she needs appointment for assessment & evaluation before making changes to medication; Please Advise if you want her to have Nurse visit and/or OV for BP check/SLS 01/08

## 2016-03-22 NOTE — Telephone Encounter (Signed)
I just increased her bp medicine from 5 mg to 10 mg of norvasc. Recent visit. So have her follow instruction I gave. Will you advise/ offer her a nurse bp visit. Have her bring in her bp cuff so we can check against our in office readings. Don't want to chane/incease unless verified necessary in light of recent increase in dose. And she had very good bp reading in office the other day.  Verify is taking the norvasc as we discussed. Other bp meds were the same. No changes.

## 2016-03-23 MED ORDER — AMLODIPINE BESYLATE 5 MG PO TABS: 5.0000 mg | ORAL_TABLET | Freq: Two times a day (BID) | ORAL | 0 refills | Status: DC

## 2016-03-23 NOTE — Telephone Encounter (Signed)
Copy & Pasted from duplicate Open note 0000000:  Telephone  03/22/2016 [place] [facility] [place], PA-C  Family Medicine   Conversation  (Newest Message First)     03/22/16 7:50 PM  Mackie Pai, PA-C routed this conversation to Me . Rudene Anda, RN  Mackie Pai, PA-C   03/22/16 7:46 PM  Note    I just increased her bp medicine from 5 mg to 10 mg of norvasc. Recent visit. So have her follow instruction I gave. Will you advise/ offer her a nurse bp visit. Have her bring in her bp cuff so we can check against our in office readings. Don't want to chane/incease unless verified necessary in light of recent increase in dose. And she had very good bp reading in office the other day.  Verify is taking the norvasc as we discussed. Other bp meds were the same. No changes.

## 2016-03-23 NOTE — Telephone Encounter (Addendum)
Patient would like to speak with nurse/PCP directly, declined BP appointment with nurse states that will not help her BP and would like to discuss, please advise patient directly.

## 2016-03-23 NOTE — Addendum Note (Signed)
Addended by: Rockwell Germany on: 03/23/2016 04:23 PM   Modules accepted: Orders

## 2016-03-23 NOTE — Telephone Encounter (Signed)
LMOM detailed with contact name and number for return call RE: scheduling Nurse visit for BP check [along with her home monitor to verify readings], if she would like; no changes in medication by provider at this time d/t recent increase in dosage of BP medication and OV with good BP reading per provider instructions/SLS 01/09

## 2016-03-23 NOTE — Telephone Encounter (Addendum)
Spoke with patient regarding issues with her BP and she stated that it was only her Systolic 0000000 that was showing elevated readings in the afternoon/evenings [diastolic stable averaging 80-85].Provider response reiterated once again that she has recently had a change/increase in her dosage of Amlodipine from 5 to 10 mg daily and her BP was good during her recent OV; pt states that she has been taking 5mg  in the Am & 5mg  in the evening. Patient once again refused to schedule Nurse visit and bring in her BP monitor to evaluate any difference from manual reading; pt stated that her "BP monitor was fairly brand new". Patient stated that she was asymptomatic and was then asked to log her BP readings at different times of the day and to call back in 2-3 days with those readings [and if BP becomes elevated with symptoms to call back immediately for OV and/or instructions from provider]. 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 Upon research of patient's chart, she has diagnosis of Renal Stenosis, which information read states that this Dx can cause a secondary type of HTN called Renovascular HTN which the two main causes are :atherosclerosis of renal arteries and fibromuscular dysplasia. No evidence seen in EMR of Cardiology and/or Nephrology visits. Patient then stated that she will call back if she has any symptoms; otherwise, she will not/SLS 01/09

## 2016-03-23 NOTE — Telephone Encounter (Signed)
Information Copy & Pasted into Open note prior regarding this matter.01/09

## 2016-03-26 ENCOUNTER — Telehealth: Payer: Self-pay | Admitting: Medical

## 2016-03-26 DIAGNOSIS — R944 Abnormal results of kidney function studies: Secondary | ICD-10-CM

## 2016-03-26 DIAGNOSIS — I773 Arterial fibromuscular dysplasia: Secondary | ICD-10-CM

## 2016-03-26 DIAGNOSIS — I701 Atherosclerosis of renal artery: Secondary | ICD-10-CM

## 2016-03-26 NOTE — Telephone Encounter (Signed)
Let pt know that I am going to refer her to nephrologist for history of fibromuscular dysplasia and the finding on prior kidney testing. I still want her to come in for bp check. How is she checking her bp. Does she have machine. If she does agree to come in will she bring her machine on that day. Want to compare her machine to our reading if she uses machine.

## 2016-03-29 NOTE — Addendum Note (Signed)
Addended by: Rockwell Germany on: 03/29/2016 11:49 AM   Modules accepted: Orders

## 2016-03-29 NOTE — Telephone Encounter (Addendum)
Patient informed, understood & agreed; Referral to Nephrologist entered Rose [name] is working on it & will send last BMET results], scheduled BP check for 03/30/16 at 3:45p [pt is to bring in her home machine]/SLS 01/15

## 2016-03-29 NOTE — Telephone Encounter (Signed)
Me  4:23 PM 03/29/16 - COPY and PASTED from previous note on this matter Note    Addended by: Mackey Birchwood L on: 03/23/2016 04:23 PM   Modules accepted: Orders     Me  3:21 PM  Note    Spoke with patient regarding issues with her BP and she stated that it was only her Systolic 0000000 that was showing elevated readings in the afternoon/evenings [diastolic stable averaging 80-85].Provider response reiterated once again that she has recently had a change/increase in her dosage of Amlodipine from 5 to 10 mg daily and her BP was good during her recent OV; pt states that she has been taking 5mg  in the Am & 5mg  in the evening. Patient once again refused to schedule Nurse visit and bring in her BP monitor to evaluate any difference from manual reading; pt stated that her "BP monitor was fairly brand new". Patient stated that she was asymptomatic and was then asked to log her BP readings at different times of the day and to call back in 2-3 days with those readings [and if BP becomes elevated with symptoms to call back immediately for OV and/or instructions from provider]. 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 Upon research of patient's chart, she has diagnosis of Renal Stenosis, which information read states that this Dx can cause a secondary type of HTN called Renovascular HTN which the two main causes are :atherosclerosis of renal arteries and fibromuscular dysplasia. No evidence seen in EMR of Cardiology and/or Nephrology visits. Patient then stated that she will call back if she has any symptoms; otherwise, she will not/SLS 01/09     2:41 PM   You contacted [place] E. Mcelhannon    10:40 AM  Oneta Rack routed this conversation to Me  Oneta Rack   10:38 AM Note    Patient would like to speak with nurse/PCP directly,  declined BP appointment with nurse states that will not help her BP and would like to discuss, please advise patient directly.       10:38 AM   [place] E. Percle contacted Oneta Rack  Me  8:28 AM  Note    LMOM detailed with contact name and number for return call RE: scheduling Nurse visit for BP check [along with her home monitor to verify readings], if she would like; no changes in medication by provider at this time d/t recent increase in dosage of BP medication and OV with good BP reading per provider instructions/SLS 01/09       8:28 AM   You contacted [place] E. Hagarty  Me  8:01 AM  Note    Copy & Pasted from duplicate Open note 0000000:  Telephone  03/22/2016 [place] [facility] [place], PA-C  Family Medicine   Conversation  (Newest Message First)     03/22/16 7:50 PM  Mackie Pai, PA-C routed this conversation to Me . Rudene Anda, RN  Mackie Pai, PA-C   03/22/16 7:46 PM  Note    I just increased her bp medicine from 5 mg to 10 mg of norvasc. Recent visit. So have her follow instruction I gave. Will you advise/ offer her a nurse bp visit. Have her bring in her bp cuff so we  can check against our in office readings. Don't want to chane/incease unless verified necessary in light of recent increase in dose. And she had very good bp reading in office the other day.  Verify is taking the norvasc as we discussed. Other bp meds were the same. No changes.         March 22, 2016  2:58 PM  You routed this conversation to [employer], PA-C  Me  2:56 PM  Note    Patient has been informed numerous times with prior provider that she needs appointment for assessment & evaluation before making changes to medication; Please Advise if you want her to have Nurse visit and/or OV for BP check/SLS 01/08       1:42 PM  Quintin Alto routed this conversation to [place]  1:39 PM  Note    Caller  name: Relationship to patient: Self Can be reached:  [phone]  Pharmacy:  Reason for call: Patient called requesting a call back to discuss an increase in her BP. States it is 155/84 today and she thinks she needs to increase her medication. Plse adv      1:38 PM   [place] E. Westerfeld contacted Brita Romp  (Newest Message First)  March 23, 2016  Me  8:02 AM  Note    Information Copy & Pasted into Open note prior regarding this matter.01/09    March 22, 2016  7:50 PM  Mackie Pai, PA-C routed this conversation to Me . Rudene Anda, RN  Mackie Pai, PA-C   7:46 PM Note    I just increased her bp medicine from 5 mg to 10 mg of norvasc. Recent visit. So have her follow instruction I gave. Will you advise/ offer her a nurse bp visit. Have her bring in her bp cuff so we can check against our in office readings. Don't want to chane/incease unless verified necessary in light of recent increase in dose. And she had very good bp reading in office the other day.  Verify is taking the norvasc as we discussed. Other bp meds were the same. No changes.    Conversation: Advice Only  (Newest Message First)  March 19, 2016  Me  9:52 AM  Note    Patient states that her Therapist/Psychiatrist that treats her Bipolar has moved to a new office and is stating that patient must have a 2-day testing to see if she qualifies for updated ADD/ADHD diagnosis, as she is requesting to start Strattera Rx; reports she is taking Photographer and does not have timeto do this testing. Pt states that she was on medication for ADD prior and has to stop because of her BP. Informed patient that this is the standard procedure and that she would have to follow through with Psych and the testing required prior to receiving medication for ADD. Gave patient the option of seeing one of [facility] providers; pt agreed and contact information given via  telephone/SLS 01/05      9:52 AM   You contacted [place] E. Blatz  March 17, 2016  2:49 PM  Junie Panning Murvin Natal routed this conversation to Me  Nani Ravens  2:45 PM  Note    Pt says is requesting a call back. Pt wouldn't say why the call back is needed. She says that she don't want to get her concern mis-communicated.    Advised pt that I could definitely relay message. She says  that she would rather voice her concern once.

## 2016-03-30 ENCOUNTER — Ambulatory Visit (INDEPENDENT_AMBULATORY_CARE_PROVIDER_SITE_OTHER): Payer: Medicare Other | Admitting: Family Medicine

## 2016-03-30 VITALS — BP 141/63 | HR 65

## 2016-03-30 DIAGNOSIS — I701 Atherosclerosis of renal artery: Secondary | ICD-10-CM | POA: Diagnosis not present

## 2016-03-30 DIAGNOSIS — I10 Essential (primary) hypertension: Secondary | ICD-10-CM

## 2016-03-30 MED ORDER — VALSARTAN 80 MG PO TABS: 80.0000 mg | ORAL_TABLET | Freq: Two times a day (BID) | ORAL | 2 refills | Status: DC

## 2016-03-30 MED ORDER — CLONIDINE HCL 0.1 MG PO TABS: 0.1000 mg | ORAL_TABLET | Freq: Three times a day (TID) | ORAL | 2 refills | Status: DC

## 2016-03-30 NOTE — Progress Notes (Signed)
Pre visit review using our clinic review tool, if applicable. No additional management support is needed unless otherwise documented below in the visit note.  Patient presents in office for blood pressure check per Telephone Note 03/22/16. Reviewed current medications & regimen with the patient. Today's readings were: BP 153/59 P 74 & BP 141/63 P 65. Patient is asymptomatic.  Per Dr. Charlett Blake: START taking Amlodipine (Norvasc) 5 MG & Valsartan (Diovan) 80 MG BID (twice daily). Increase Clonidine 0.1 MG to TID (three times daily). Return in 2 weeks for nurse visit to have blood pressure checked/labs. Follow-up with PCP in 4 weeks for an office visit.  Informed patient of the provider's recommendations. She verbalized understanding. Next appointment scheduled for 04/13/16 at 3:00 PM.  Reviewed Dr. Randel Pigg changes and in agreement.  [place], Percell Miller, PA-C

## 2016-03-30 NOTE — Patient Instructions (Addendum)
Per Dr. Charlett Blake: START taking Amlodipine (Norvasc) 5 MG & Valsartan (Diovan) 80 MG BID (twice daily). Increase Clonidine 0.1 MG to TID (three times daily). Return in 2 weeks for nurse visit to have blood pressure checked/labs. Follow-up with PCP in 4 weeks for an office visit.

## 2016-03-30 NOTE — Progress Notes (Signed)
RN blood pressure check note reviewed. Agree with documention and plan. 

## 2016-04-08 ENCOUNTER — Telehealth: Payer: Self-pay | Admitting: Medical

## 2016-04-08 ENCOUNTER — Other Ambulatory Visit: Payer: Self-pay | Admitting: Medical

## 2016-04-08 DIAGNOSIS — I10 Essential (primary) hypertension: Secondary | ICD-10-CM

## 2016-04-08 NOTE — Telephone Encounter (Signed)
Relation to [id number] Call back number:[phone] Pharmacy: [facility], [address] [phone] (Phone) [phone] (Fax)     Reason for call:  Patient requesting a refill amLODipine (NORVASC) 5 MG tablet patient states please ensure BID

## 2016-04-09 ENCOUNTER — Telehealth: Payer: Self-pay | Admitting: Medical

## 2016-04-09 MED ORDER — LEVOTHYROXINE SODIUM 112 MCG PO TABS: ORAL_TABLET | ORAL | 3 refills | Status: DC

## 2016-04-09 MED ORDER — AMLODIPINE BESYLATE 5 MG PO TABS: 5.0000 mg | ORAL_TABLET | Freq: Two times a day (BID) | ORAL | 0 refills | Status: DC

## 2016-04-09 NOTE — Telephone Encounter (Signed)
Rx request to pharmacy, 30-day supply; pt has Nurse visit scheduled for 04/13/16 for BP check/SLS 01/26  All Notes  Progress Notes by Mosie Lukes, MD at 03/30/2016 3:45 PM   Author: Mosie Lukes, MD Author Type: Physician Filed: 03/30/2016 5:39 PM  Note Status: Signed Cosign: Cosign Not Required Encounter Date: 03/30/2016 3:45 PM  Editor: Mosie Lukes, MD (Physician)    RN blood pressure check note reviewed. Agree with documention and plan.    Progress Notes by Kathlen Brunswick, RN at 03/30/2016 3:45 PM   Author: Kathlen Brunswick, RN Author Type: Registered Nurse Filed: 03/30/2016 5:19 PM  Note Status: Signed Cosign: Cosign Not Required Encounter Date: 03/30/2016 3:45 PM  Editor: Mackie Pai, PA-C (Physician Assistant)  Prior Versions: 1. Kathlen Brunswick, RN (Registered Nurse) at 03/30/2016 5:08 PM - Sign at close encounter    Pre visit review using our clinic review tool, if applicable. No additional management support is needed unless otherwise documented below in the visit note.  Patient presents in office for blood pressure check per Telephone Note 03/22/16. Reviewed current medications & regimen with the patient. Today's readings were: BP 153/59 P 74 & BP 141/63 P 65. Patient is asymptomatic.  Per Dr. Charlett Blake: START taking Amlodipine (Norvasc) 5 MG & Valsartan (Diovan) 80 MG BID (twice daily). Increase Clonidine 0.1 MG to TID (three times daily). Return in 2 weeks for nurse visit to have blood pressure checked/labs. Follow-up with PCP in 4 weeks for an office visit.  Informed patient of the provider's recommendations. She verbalized understanding. Next appointment scheduled for 04/13/16 at 3:00 PM.  Reviewed Dr. Randel Pigg changes and in agreement.  Saguier, Percell Miller, PA-C

## 2016-04-09 NOTE — Telephone Encounter (Signed)
Rx request to pharmacy/SLS  

## 2016-04-09 NOTE — Telephone Encounter (Signed)
Caller name: Relationship to patient:Self  Can be reached: [phone]  Pharmacy:                            Demographics         Rose [name] 76 year old female 28-Mar-1940 Comm Pref:   Works at [employer]  [address] [place] [address] [phone] (H)      Advanced Directives         10/23/15 09/26/14 07/31/14  Does Patient Have a Medical Advance Directive? Yes Yes No  Type of Advance Directive Healthcare Power of [place], Living will Healthcare Power of [place], Living will --  [place] in Chart? No - copy requested -- --  Would patient like information on creating a medical advance directive? -- -- No - patient declined information      Chief Complaint        Medication Refill        Problem List                     Malignant neoplasm of skin of parts of face  MORTON'S NEUROMA, LEFT  Hereditary and idiopathic peripheral neuropathy  Essential hypertension  CAROTID ARTERY DISEASE  SUBCLAVIAN STEAL SYNDROME  RENAL ARTERY STENOSIS  GERD  BREAST CYST  Pain due to total hip replacement ([place])  OTHER TENOSYNOVITIS OF HAND AND WRIST  HEART MURMUR, BENIGN  CAROTID BRUIT, LEFT  ALCOHOL ABUSE, HX OF  DUODENAL ULCER, HX OF  Other acquired absence of organ  CARCINOMA, BASAL CELL, FACE  Depression  Hypothyroidism  Lung mass  COPD (chronic obstructive pulmonary disease) ([place])  Encounter for Medicare annual wellness exam  Screening for osteoporosis  ADD (attention deficit disorder)  Muscle atrophy  Arthritis of left shoulder region  Fecal incontinence  Leg length discrepancy  Tensor fascia lata syndrome                 Mark as Reviewed   Medications (Reviewed by RN on 03/30/2016)        Health Maintenance         10/24/1998  ZOSTAVAX     11/28/2008  PNA vac Low Risk Adult (2 of 2 - PCV13)    06/12/2016   INFLUENZA VACCINE      11/26/2019  TETANUS/TDAP        Reminders and Results         None         Care Team and Communications        No referring provider set      PCPs Type  Mackie Pai, PA-C General    Other Patient Care Team Members Relationship  Lyndal Pulley, DO Attending Physician    Recipients of Past [facility] Encounter - 10/23/2015    Daryll Brod, MD 10/23/2015 Outside Provider Messaging  Office Visit - 04/22/2015    Brunetta Jeans, PA-C 04/22/2015 In Basket  Telephone - 12/03/2014    [facility] (Inactive) 12/18/2014 Fax  Letter (Out) - 10/23/2014    MARICELA ALLING 10/23/2014 [facility] Encounter - 09/26/2014    Daryll Brod, MD 09/26/2014 [facility] Encounter - 08/27/2014    Daryll Brod, MD 08/27/2014 Fax  Hospital Encounter - 07/31/2014    Brunetta Jeans, PA-C 07/31/2014 In [place], PA-C  07/31/2014 In Basket  Telephone - 06/18/2014    [facility] (Inactive) 07/02/2014 Fax  Office Visit - 08/02/2013    Deirdre Pippins, PA-C 11/20/2013 Fax  Hospital Encounter - 08/07/2013    Daryll Brod, MD 08/07/2013 Fax  Telephone - 01/31/2013    [place] 01/31/2013 Mail  Appointment - 07/15/2010    Neena Rhymes, MD 07/17/2010 In Basket        Allergies           Amoxicillin  Codeine  Hydrocodone-acetaminophen  Hydromorphone Hcl  Morphine And RelatedHives, Itching  Morphine Sulfate  Nsaids       Medications          alendronate (FOSAMAX) 70 MG tablet    amLODipine (NORVASC) 5 MG tablet    aspirin 81 MG tablet    cloNIDine (CATAPRES) 0.1 MG tablet    DULoxetine (CYMBALTA) 60 MG capsule    ferrous sulfate dried (SLOW FE) 160 (50 FE) MG TBCR    gabapentin (NEURONTIN) 600 MG tablet    Glucosamine 500 MG TABS    levothyroxine (SYNTHROID, LEVOTHROID) 112 MCG tablet    OLANZapine (ZYPREXA) 5 MG tablet    valsartan (DIOVAN) 80 MG tablet           Mark as  Reviewed   Reviewed by RN on 03/30/2016       Preferred Pharmacies           [facility], [address] [phone] (Phone) [phone] (Fax)  [place], [place] - [address] [phone] (Phone) [phone] (Fax)      Immunizations/Injections        PPD Test3/06/2014, 05/14/2014, 10/03/2013   Pneumococcal Polysaccharide-239/16/2009   Td9/13/2011, 03/16/1999       Significant History/Details        Smoking: Former Smoker (Quit Date: 03/16/1983), 1 ppd, 25 pack-years  Smokeless Tobacco: Never Used  Alcohol: Yes  Comments: **All appointments must be 30 minutes WCM**  4 open orders  Preferred Language: English      Specialty Comments   Show AllReport        04/08/2015 Called patient for pr-visit information Left message for call back  Medication: Review, verify sig & reconcile(including outside meds): Duplicates discarded: DM supply source:  Preferred Pharmacy and which med where: 90 day supply/mail order:  Local pharmacy:   Allergies verified:  Immunization Status: Prompted for insurance verification:  Flu vaccine-- Tdap-- PNA-- Shingles--  A/P:  Changes to [place], PSH or Personal Hx: Pap-- MMG-- Bone Density-- CCS--  Care Teams Updated: ED/Hospital/Urgent Care Visits: Prompted for: Updated insurance, contact information, forms: Remind to bring: DPR information, advance directives:   To Discuss with Provider:      Family Comments          None                                                                                   Oxygen Therapy         03/30/16 03/16/16 10/29/15  SpO2 99 % 99 % 95 %  Caller name: Relationship to patient: Self Can be reached: [phone]  Pharmacy:  [facility], [place] - [address] [phone] (Phone) [phone] (Fax       Reason for call: Request refill on levothyroxine (SYNTHROID, LEVOTHROID) 112 MCG

## 2016-04-13 ENCOUNTER — Ambulatory Visit (INDEPENDENT_AMBULATORY_CARE_PROVIDER_SITE_OTHER): Payer: Medicare Other | Admitting: Family Medicine

## 2016-04-13 VITALS — BP 126/58 | HR 75

## 2016-04-13 DIAGNOSIS — I10 Essential (primary) hypertension: Secondary | ICD-10-CM | POA: Diagnosis not present

## 2016-04-13 DIAGNOSIS — I701 Atherosclerosis of renal artery: Secondary | ICD-10-CM

## 2016-04-13 NOTE — Progress Notes (Signed)
Pre visit review using our clinic tool,if applicable. No additional management support is needed unless otherwise documented below in the visit note.   Patient in for BP check per order from E. Saguier. Dr. Charlett Blake covering. Per patient she would like to change Clonidine 0.1mg  from tid to bid because it makes her sleepy. BP today =126/58 P =75. Per Dr. Charlett Blake ok with change Clonidine  to bid and return in 1 month for BP check.. Patient agreed.

## 2016-04-13 NOTE — Progress Notes (Signed)
RN BP check note reviewed. Agree with documention and plan.

## 2016-05-01 ENCOUNTER — Other Ambulatory Visit: Payer: Self-pay | Admitting: Family Medicine

## 2016-05-01 ENCOUNTER — Other Ambulatory Visit: Payer: Self-pay | Admitting: Medical

## 2016-05-01 DIAGNOSIS — I10 Essential (primary) hypertension: Secondary | ICD-10-CM

## 2016-05-03 ENCOUNTER — Telehealth: Payer: Self-pay | Admitting: Medical

## 2016-05-03 ENCOUNTER — Other Ambulatory Visit: Payer: Self-pay

## 2016-05-03 DIAGNOSIS — E039 Hypothyroidism, unspecified: Secondary | ICD-10-CM

## 2016-05-03 NOTE — Telephone Encounter (Signed)
Caller name: Relationship to patient: Self Can be reached: [phone] Pharmacy:  Reason for call: Patient request to have an order for labs to get her Thyroid checked. States its been over a year since she last had it checked. Plse adv

## 2016-05-03 NOTE — Telephone Encounter (Signed)
Contacted patient and scheduled Lab appointment for tomorrow, 05/04/16 at 10:45am, lab order placed/SLS 02/19  TSH  Order: [id number]  Status:  Final result Visible to patient:  Yes (MyChart) Next appt:  05/12/2016 at 03:00 PM in Family Medicine ([facility]-SW Nurse) Dx:  Hypothyroidism, unspecified hypothyro...  Notes Recorded by Bunnie Domino, LPN on [id number] at 075-GRM PM EDT Called patient with result notes. Called in medication with 3 refills. ------ Notes Recorded by Mackie Pai, PA-C on 11/05/2015 at 4:14 PM EDT Pt thyroid study is in mid line normal range. Let pt know. Also would you could call her pharmacy and verbally ok 3 refills of that last rx(on 11-03-2015). Then will repeat tsh studies again in 4 months.    Ref Range & Units 15mo ago (11/05/15) 79yr ago (04/09/15) 38yr ago (05/14/14) 79yr ago (08/02/13) 53yr ago (11/21/09) 38yr ago (04/19/06)   TSH 0.35 - 4.50 uIU/mL 1.86  0.37  1.94  0.690R  0.47R  0.07R

## 2016-05-04 ENCOUNTER — Other Ambulatory Visit (INDEPENDENT_AMBULATORY_CARE_PROVIDER_SITE_OTHER): Payer: Medicare Other

## 2016-05-04 DIAGNOSIS — E039 Hypothyroidism, unspecified: Secondary | ICD-10-CM | POA: Diagnosis not present

## 2016-05-04 DIAGNOSIS — D649 Anemia, unspecified: Secondary | ICD-10-CM

## 2016-05-04 LAB — IRON AND TIBC
%SAT: 19 % (ref 11–50)
IRON: 67 ug/dL (ref 45–160)
TIBC: 356 ug/dL (ref 250–450)
UIBC: 289 ug/dL (ref 125–400)

## 2016-05-04 LAB — FERRITIN: Ferritin: 37.5 ng/mL (ref 10.0–291.0)

## 2016-05-04 LAB — TSH: TSH: 2.25 u[IU]/mL (ref 0.35–4.50)

## 2016-05-05 ENCOUNTER — Telehealth: Payer: Self-pay | Admitting: Medical

## 2016-05-05 DIAGNOSIS — D649 Anemia, unspecified: Secondary | ICD-10-CM | POA: Diagnosis not present

## 2016-05-05 MED ORDER — VALSARTAN 80 MG PO TABS: 80.0000 mg | ORAL_TABLET | Freq: Two times a day (BID) | ORAL | 0 refills | Status: DC

## 2016-05-05 NOTE — Telephone Encounter (Signed)
Rx request to pharmacy/SLS  

## 2016-05-05 NOTE — Telephone Encounter (Signed)
[facility] - [place], [address] [phone] (Phone) [phone] (Fax)   Pharmacy requested a 90 day supply valsartan (DIOVAN) 80 MG tablet and states a 30 day supply was sent in, please advise

## 2016-05-27 ENCOUNTER — Other Ambulatory Visit: Payer: Self-pay

## 2016-05-27 DIAGNOSIS — I10 Essential (primary) hypertension: Secondary | ICD-10-CM

## 2016-05-27 MED ORDER — AMLODIPINE BESYLATE 5 MG PO TABS: 5.0000 mg | ORAL_TABLET | Freq: Two times a day (BID) | ORAL | 0 refills | Status: DC

## 2016-05-31 ENCOUNTER — Ambulatory Visit: Payer: Medicare Other | Admitting: Psychology

## 2016-06-07 NOTE — Progress Notes (Signed)
Subjective:   Rose [name] is a 76 y.o. female who presents for Medicare Annual (Subsequent) preventive examination.  Review of Systems:  No ROS.  Medicare Wellness Visit. Cardiac Risk Factors include: advanced age (>102men, >38 women);hypertension Sleep patterns:  Sleeps -9 hrs / night.  Home Safety/Smoke Alarms: Feels safe in home. Smoke alarms in place.   Living environment; residence and Firearm Safety: Lives alone in 1st floor apt. No safe. Seat Belt Safety/Bike Helmet: Wears seat belt.   Counseling:   Eye Exam- wears reading glasses. Only goes as needed. Dental- Looking for new dentist.  Female:   Pap- pt reports last year with Dr.Henley-normal   Mammo- Last pt reports last year with Dr.Henley-normal   Dexa scan- Last 10/21/15: osteoporosis.       CCS- Last 07/16/02: no result on file. Pt reports has done more recent and will get records.    Objective:     Vitals: BP (!) 120/56 (BP Location: Right Arm, Patient Position: Sitting, Cuff Size: Normal)   Pulse 71   Ht 5\' 3"  (1.6 m)   Wt 131 lb (59.4 kg)   SpO2 97%   BMI 23.21 kg/m   Body mass index is 23.21 kg/m.   Tobacco History  Smoking Status  . Former Smoker  . Packs/day: 1.00  . Years: 25.00  . Types: Cigarettes  . Quit date: 03/16/1983  Smokeless Tobacco  . Never Used     Counseling given: Not Answered   Past Medical History:  Diagnosis Date  . Abnormal blood pressure    left arm from old brachial artery repair  . Anemia   . Anxiety   . Atherosclerosis of renal artery ([place])   . Attention deficit disorder without mention of hyperactivity   . Basal cell carcinoma of face   . Benign heart murmur   . Carotid artery disease ([place])   . Carotid bruit    Left  . Centrilobular emphysema ([place])   . Chronic bronchitis ([place])   . Depression   . DJD (degenerative joint disease) of hip   . Esophageal reflux   . History of depression   . History of spinal fusion   . Hypertension   . Morton's neuroma    Hx  of, Left  . Myalgia and myositis, unspecified   . Other tenosynovitis of hand and wrist   . Peripheral neuropathy ([place])   . Personal history of alcoholism ([place])   . Personal history of peptic ulcer disease   . Pulmonary nodule   . Rotator cuff disorder    pain  . Solitary cyst of breast   . Subclavian steal syndrome   . Unspecified hypothyroidism   . Wears glasses    Past Surgical History:  Procedure Laterality Date  . APPENDECTOMY    . CARPOMETACARPEL SUSPENSION PLASTY Right 08/07/2013   Procedure: CARPOMETACARPEL [facility]) SUSPENSION PLASTY RIGHT THUMB;  Surgeon: Wynonia Sours, MD;  Location: [facility];  Service: Orthopedics;  Laterality: Right;  . CARPOMETACARPEL SUSPENSION PLASTY Left 10/23/2015   Procedure: SUSPENSION PLASTY LEFT THUMB TRAPEZIUM EXCISION ABDUCTOR POLLICIS LONGUS TRANSFER;  Surgeon: Daryll Brod, MD;  Location: [facility];  Service: Orthopedics;  Laterality: Left;  . CERVICAL FUSION  2007   Dr. Valli Glance approach  . JOINT REPLACEMENT Left   . Left brachial artery repair of pseudoaneurysm  2001   post a cath  . LUMBAR FUSION  09/2004   T12-L5 (Dr. Patrice Paradise)  . MINOR IRRIGATION AND DEBRIDEMENT OF  WOUND Right 08/27/2014   Procedure: MINOR IRRIGATION AND DEBRIDEMENT OF WOUND;  Surgeon: Daryll Brod, MD;  Location: [facility];  Service: Orthopedics;  Laterality: Right;  . ORIF TIBIA FRACTURE  2010   Left distal  . Percutanous Transluminal Angioplasty of Renal Arteris    . REPAIR EXTENSOR TENDON Right 09/26/2014   Procedure: REPAIR EXTENSOR TENDON RIGHT RING FINGER ;  Surgeon: Daryll Brod, MD;  Location: [facility];  Service: Orthopedics;  Laterality: Right;  . SHOULDER ARTHROSCOPY  09/2008   Left  . TONSILLECTOMY AND ADENOIDECTOMY    . TOTAL HIP ARTHROPLASTY  2008   left  . WISDOM TOOTH EXTRACTION     Family History  Problem Relation Age of Onset  . Adopted: Yes  . Schizophrenia Son     Deceased 53  .  Hypertension Daughter     Renal  . Healthy Son     x1  . Healthy Daughter     x2   History  Sexual Activity  . Sexual activity: Not on file    Outpatient Encounter Prescriptions as of 06/10/2016  Medication Sig  . alendronate (FOSAMAX) 70 MG tablet Take 1 tablet (70 mg total) by mouth every 7 (seven) days. Take with a full glass of water on an empty stomach.  Marland Kitchen amLODipine (NORVASC) 5 MG tablet Take 1 tablet (5 mg total) by mouth 2 (two) times daily.  Marland Kitchen aspirin 81 MG tablet Take 81 mg by mouth daily.   . cloNIDine (CATAPRES) 0.1 MG tablet Take 1 tablet (0.1 mg total) by mouth 3 (three) times daily.  . DULoxetine (CYMBALTA) 60 MG capsule TAKE ONE CAPSULE BY MOUTH DAILY  . ferrous sulfate dried (SLOW FE) 160 (50 FE) MG TBCR Take 160 mg by mouth 2 (two) times daily. Patient has been taking Once Daily  . gabapentin (NEURONTIN) 600 MG tablet TAKE ONE TABLET BY MOUTH THREE TIMES A DAY (Patient taking differently: TAKE ONE TABLET THREE TIMES DAILY AS NEEDED)  . Glucosamine 500 MG TABS Take 1 tablet by mouth 2 (two) times daily. Glucosamine-Chondronton  . levothyroxine (SYNTHROID, LEVOTHROID) 112 MCG tablet TAKE 1 TABLET (112 MCG TOTAL) BY MOUTH DAILY BEFORE BREAKFAST.  Marland Kitchen OLANZapine (ZYPREXA) 5 MG tablet Take 5 mg by mouth at bedtime.  . valsartan (DIOVAN) 80 MG tablet Take 1 tablet (80 mg total) by mouth 2 (two) times daily.  . [DISCONTINUED] cloNIDine (CATAPRES) 0.1 MG tablet TAKE ONE TABLET BY MOUTH TWICE A DAY   No facility-administered encounter medications on file as of 06/10/2016.     Activities of Daily Living In your present state of health, do you have any difficulty performing the following activities: 06/10/2016 10/23/2015  Hearing? Y N  Vision? N N  Difficulty concentrating or making decisions? Y N  Walking or climbing stairs? N N  Dressing or bathing? N N  Doing errands, shopping? Y -  Conservation officer, nature and eating ? N -  Using the Toilet? N -  In the past six months, have you  accidently leaked urine? Y -  Do you have problems with loss of bowel control? N -  Managing your Medications? N -  Managing your Finances? N -  Housekeeping or managing your Housekeeping? N -  Some recent data might be hidden    Patient Care Team: Mackie Pai, PA-C as PCP - General (Internal Medicine) Lyndal Pulley, DO as Attending Physician (Sports Medicine)    Assessment:    Physical assessment deferred to PCP.  Exercise Activities and Dietary recommendations Current Exercise Habits: The patient does not participate in regular exercise at present   Diet (meal preparation, eat out, water intake, caffeinated beverages, dairy products, fruits and vegetables): in general, a "healthy" diet      Goals      Patient Stated   . <enter goal here> (pt-stated)          Complete bible study course.      Fall Risk Fall Risk  06/10/2016 04/20/2015  Falls in the past year? Yes No  Number falls in past yr: 2 or more -  Injury with Fall? No -  Follow up Falls prevention discussed;Education provided -   Depression Screen PHQ 2/9 Scores 06/10/2016 04/20/2015  PHQ - 2 Score 0 0     Cognitive Function MMSE - Mini Mental State Exam 06/10/2016  Orientation to time 5  Orientation to Place 5  Registration 3  Attention/ Calculation 5  Recall 2  Language- name 2 objects 2  Language- repeat 1  Language- follow 3 step command 3  Language- read & follow direction 1  Write a sentence 1  Copy design 1  Total score 29        Immunization History  Administered Date(s) Administered  . PPD Test 10/03/2013, 05/14/2014, 05/17/2014  . Pneumococcal Polysaccharide-23 11/29/2007  . Td 03/16/1999, 11/25/2009   Screening Tests Health Maintenance  Topic Date Due  . PNA vac Low Risk Adult (2 of 2 - PCV13) 11/28/2008  . INFLUENZA VACCINE  06/12/2016 (Originally 10/14/2015)  . TETANUS/TDAP  11/26/2019  . DEXA SCAN  Completed      Plan:     Follow up With PCP.  Continue to eat heart  healthy diet (full of fruits, vegetables, whole grains, lean protein, water--limit salt, fat, and sugar intake) and increase physical activity as tolerated.  Continue doing brain stimulating activities (puzzles, reading, adult coloring books, staying active) to keep memory sharp.   During the course of the visit the patient was educated and counseled about the following appropriate screening and preventive services:   Vaccines to include Pneumoccal, Influenza, Td, HCV  Cardiovascular Disease  Colorectal cancer screening  Bone density screening  Diabetes screening  Glaucoma screening  Mammography/PAP  Nutrition counseling   Patient Instructions (the written plan) was given to the patient.   Krist[place], [place], [place]  06/10/2016    Reviewed RN evaluation. Agree with Assessment and plan. Will follow up with her on next visit and attempt to update maintenance deficiencies.  Saguier, Percell Miller, PA-C

## 2016-06-07 NOTE — Progress Notes (Signed)
Pre visit review using our clinic review tool, if applicable. No additional management support is needed unless otherwise documented below in the visit note. 

## 2016-06-10 ENCOUNTER — Encounter: Payer: Self-pay | Admitting: *Deleted

## 2016-06-10 ENCOUNTER — Ambulatory Visit (INDEPENDENT_AMBULATORY_CARE_PROVIDER_SITE_OTHER): Payer: Medicare Other | Admitting: *Deleted

## 2016-06-10 VITALS — BP 120/56 | HR 71 | Ht 63.0 in | Wt 131.0 lb

## 2016-06-10 DIAGNOSIS — Z0000 Encounter for general adult medical examination without abnormal findings: Secondary | ICD-10-CM | POA: Diagnosis not present

## 2016-06-10 NOTE — Patient Instructions (Signed)
Continue to eat heart healthy diet (full of fruits, vegetables, whole grains, lean protein, water--limit salt, fat, and sugar intake) and increase physical activity as tolerated.  Continue doing brain stimulating activities (puzzles, reading, adult coloring books, staying active) to keep memory sharp.

## 2016-06-24 ENCOUNTER — Other Ambulatory Visit: Payer: Self-pay

## 2016-06-24 DIAGNOSIS — I10 Essential (primary) hypertension: Secondary | ICD-10-CM

## 2016-06-29 MED ORDER — AMLODIPINE BESYLATE 5 MG PO TABS: 5.0000 mg | ORAL_TABLET | Freq: Two times a day (BID) | ORAL | 0 refills | Status: DC

## 2016-07-12 ENCOUNTER — Encounter: Payer: Self-pay | Admitting: Medical

## 2016-07-12 ENCOUNTER — Ambulatory Visit (INDEPENDENT_AMBULATORY_CARE_PROVIDER_SITE_OTHER): Payer: Medicare Other | Admitting: Medical

## 2016-07-12 VITALS — BP 125/51 | HR 55 | Temp 98.3°F | Resp 16 | Ht 63.0 in | Wt 131.6 lb

## 2016-07-12 DIAGNOSIS — M25552 Pain in left hip: Secondary | ICD-10-CM

## 2016-07-12 DIAGNOSIS — I701 Atherosclerosis of renal artery: Secondary | ICD-10-CM

## 2016-07-12 MED ORDER — PREDNISONE 10 MG PO TABS: ORAL_TABLET | ORAL | 0 refills | Status: DC

## 2016-07-12 NOTE — Progress Notes (Signed)
Pre visit review using our clinic review tool, if applicable. No additional management support is needed unless otherwise documented below in the visit note. 

## 2016-07-12 NOTE — Progress Notes (Signed)
Subjective:    Patient ID: Rose [name], female    DOB: 1940-11-05, 76 y.o.   MRN: [id number]  HPI  Pt in with left hip pain.  Pt states hip pain is severe most of the time. Wore on ambulting. Pt states one MD did [place] of area and showed calcific nerve.  Pt had total hip replaced in the past.   Pt did have injection of area per Dr Carola Rhine note. Pt not sure if the injection helped at all. Then makes commend if it did help it did not help  much and not for long. States pain  getting worse on ambulation some time now.  Pt had ct of her hip in 2016.    Review of Systems  Constitutional: Negative for chills, fatigue and fever.  Respiratory: Negative for chest tightness, shortness of breath and wheezing.   Cardiovascular: Negative for chest pain and palpitations.  Gastrointestinal: Negative for abdominal pain, diarrhea, nausea and vomiting.  Musculoskeletal:       Lt hip pain.  Skin: Negative for rash.  Neurological: Negative for dizziness, weakness and headaches.  Hematological: Negative for adenopathy. Does not bruise/bleed easily.  Psychiatric/Behavioral: Negative for behavioral problems and confusion.    Past Medical History:  Diagnosis Date  . Abnormal blood pressure    left arm from old brachial artery repair  . Anemia   . Anxiety   . Atherosclerosis of renal artery (HCC)   . Attention deficit disorder without mention of hyperactivity   . Basal cell carcinoma of face   . Benign heart murmur   . Carotid artery disease (HCC)   . Carotid bruit    Left  . Centrilobular emphysema (HCC)   . Chronic bronchitis (HCC)   . Depression   . DJD (degenerative joint disease) of hip   . Esophageal reflux   . History of depression   . History of spinal fusion   . Hypertension   . Morton's neuroma    Hx of, Left  . Myalgia and myositis, unspecified   . Other tenosynovitis of hand and wrist   . Peripheral neuropathy   . Personal history of alcoholism (HCC)   . Personal  history of peptic ulcer disease   . Pulmonary nodule   . Rotator cuff disorder    pain  . Solitary cyst of breast   . Subclavian steal syndrome   . Unspecified hypothyroidism   . Wears glasses      Social History   Social History  . Marital status: Single    Spouse name: N/A  . Number of children: Y  . Years of education: N/A   Occupational History  . retired Retired    IT trainer, had own firm   Social History Main Topics  . Smoking status: Former Smoker    Packs/day: 1.00    Years: 25.00    Types: Cigarettes    Quit date: 03/16/1983  . Smokeless tobacco: Never Used  . Alcohol use Yes     Comment: 2-3 glasses wine daily  . Drug use: No  . Sexual activity: Not on file   Other Topics Concern  . Not on file   Social History Narrative   [employer]-accounting      Married '60-73yr divorced; '84-2 years, divorced      3 daughters- '61, '70, '71; 2 sons-'53,'55 (schizophrenic-died); 10 grandchildren      Lives alone with 1 dog and 3 cats      Pt unsure of family  history- was adopted.                   Past Surgical History:  Procedure Laterality Date  . APPENDECTOMY    . CARPOMETACARPEL SUSPENSION PLASTY Right 08/07/2013   Procedure: CARPOMETACARPEL [facility]) SUSPENSION PLASTY RIGHT THUMB;  Surgeon: Nicki Reaper, MD;  Location: [facility];  Service: Orthopedics;  Laterality: Right;  . CARPOMETACARPEL SUSPENSION PLASTY Left 10/23/2015   Procedure: SUSPENSION PLASTY LEFT THUMB TRAPEZIUM EXCISION ABDUCTOR POLLICIS LONGUS TRANSFER;  Surgeon: Cindee Salt, MD;  Location: [facility];  Service: Orthopedics;  Laterality: Left;  . CERVICAL FUSION  2007   Dr. Maylene Roes approach  . JOINT REPLACEMENT Left   . Left brachial artery repair of pseudoaneurysm  2001   post a cath  . LUMBAR FUSION  09/2004   T12-L5 (Dr. Noel Gerold)  . MINOR IRRIGATION AND DEBRIDEMENT OF WOUND Right 08/27/2014   Procedure: MINOR IRRIGATION AND DEBRIDEMENT OF WOUND;   Surgeon: Cindee Salt, MD;  Location: [facility];  Service: Orthopedics;  Laterality: Right;  . ORIF TIBIA FRACTURE  2010   Left distal  . Percutanous Transluminal Angioplasty of Renal Arteris    . REPAIR EXTENSOR TENDON Right 09/26/2014   Procedure: REPAIR EXTENSOR TENDON RIGHT RING FINGER ;  Surgeon: Cindee Salt, MD;  Location: [facility];  Service: Orthopedics;  Laterality: Right;  . SHOULDER ARTHROSCOPY  09/2008   Left  . TONSILLECTOMY AND ADENOIDECTOMY    . TOTAL HIP ARTHROPLASTY  2008   left  . WISDOM TOOTH EXTRACTION      Family History  Problem Relation Age of Onset  . Adopted: Yes  . Schizophrenia Son     Deceased 1  . Hypertension Daughter     Renal  . Healthy Son     x1  . Healthy Daughter     x2    Allergies  Allergen Reactions  . Amoxicillin     REACTION: Rash  . Codeine   . Hydrocodone-Acetaminophen     REACTION: itching  . Hydromorphone Hcl   . Morphine And Related Hives and Itching  . Morphine Sulfate     REACTION: unspecified  . Nsaids     Ulcer    Current Outpatient Prescriptions on File Prior to Visit  Medication Sig Dispense Refill  . alendronate (FOSAMAX) 70 MG tablet Take 1 tablet (70 mg total) by mouth every 7 (seven) days. Take with a full glass of water on an empty stomach. 4 tablet 11  . amLODipine (NORVASC) 5 MG tablet Take 1 tablet (5 mg total) by mouth 2 (two) times daily. 60 tablet 0  . aspirin 81 MG tablet Take 81 mg by mouth daily.     . cloNIDine (CATAPRES) 0.1 MG tablet Take 1 tablet (0.1 mg total) by mouth 3 (three) times daily. 90 tablet 2  . DULoxetine (CYMBALTA) 60 MG capsule TAKE ONE CAPSULE BY MOUTH DAILY 30 capsule 5  . ferrous sulfate dried (SLOW FE) 160 (50 FE) MG TBCR Take 160 mg by mouth 2 (two) times daily. Patient has been taking Once Daily    . gabapentin (NEURONTIN) 600 MG tablet TAKE ONE TABLET BY MOUTH THREE TIMES A DAY (Patient taking differently: TAKE ONE TABLET THREE TIMES DAILY AS  NEEDED) 90 tablet 4  . Glucosamine 500 MG TABS Take 1 tablet by mouth 2 (two) times daily. Glucosamine-Chondronton    . levothyroxine (SYNTHROID, LEVOTHROID) 112 MCG tablet TAKE 1 TABLET (112 MCG TOTAL)  BY MOUTH DAILY BEFORE BREAKFAST. 30 tablet 3  . OLANZapine (ZYPREXA) 5 MG tablet Take 10 mg by mouth at bedtime.     . valsartan (DIOVAN) 80 MG tablet Take 1 tablet (80 mg total) by mouth 2 (two) times daily. 180 tablet 0   No current facility-administered medications on file prior to visit.     BP (!) 125/51 (BP Location: Right Arm, Patient Position: Sitting, Cuff Size: Normal)   Pulse (!) 55   Temp 98.3 F (36.8 C) (Oral)   Resp 16   Ht 5\' 3"  (1.6 m)   Wt 131 lb 9.6 oz (59.7 kg)   SpO2 92%   BMI 23.31 kg/m       Objective:   Physical Exam   General- No acute distress. Pleasant patient. Neck- Full range of motion, no jvd Lungs- Clear, even and unlabored. Heart- regular rate and rhythm. Neurologic- CNII- XII grossly intact.  Lt hip- no pain on palpation. No pain on range motion. Pain seems more just and medial to anterior superior iliac crest region     Assessment & Plan:  For your hip area pain will refer you to sport medicine. Will get xray of hip today.   I will make short taper dose prednisone available.  I will review xray and then decide if further imaging needed such as ct or mri.   During the interim sports med can evalute and possibly repeat US.  Follow up with me as needed.  Vergie Zahm, Ramon Dredge, PA-C

## 2016-07-12 NOTE — Patient Instructions (Addendum)
For your hip area pain will refer you to sport medicine. Will get xray of hip today.   I will make short taper dose prednisone available.  I will review xray and then decide if further imaging needed such as ct or mri.   During the interim sports med can evalute and possibly repeat US.  Follow up with me as needed.

## 2016-07-13 ENCOUNTER — Ambulatory Visit (HOSPITAL_BASED_OUTPATIENT_CLINIC_OR_DEPARTMENT_OTHER)
Admission: RE | Admit: 2016-07-13 | Discharge: 2016-07-13 | Disposition: A | Discharge status: Home / Self Care | Payer: Medicare Other | Source: Ambulatory Visit | Attending: Medical | Admitting: Medical

## 2016-07-13 DIAGNOSIS — M25852 Other specified joint disorders, left hip: Secondary | ICD-10-CM | POA: Insufficient documentation

## 2016-07-13 DIAGNOSIS — M25552 Pain in left hip: Secondary | ICD-10-CM | POA: Insufficient documentation

## 2016-07-13 DIAGNOSIS — Z471 Aftercare following joint replacement surgery: Secondary | ICD-10-CM | POA: Diagnosis not present

## 2016-07-13 DIAGNOSIS — M1612 Unilateral primary osteoarthritis, left hip: Secondary | ICD-10-CM | POA: Diagnosis not present

## 2016-07-13 DIAGNOSIS — Z96642 Presence of left artificial hip joint: Secondary | ICD-10-CM | POA: Diagnosis not present

## 2016-07-19 ENCOUNTER — Ambulatory Visit: Payer: Self-pay | Admitting: Family Medicine

## 2016-07-20 DIAGNOSIS — D2262 Melanocytic nevi of left upper limb, including shoulder: Secondary | ICD-10-CM | POA: Diagnosis not present

## 2016-07-20 DIAGNOSIS — D2261 Melanocytic nevi of right upper limb, including shoulder: Secondary | ICD-10-CM | POA: Diagnosis not present

## 2016-07-20 DIAGNOSIS — D225 Melanocytic nevi of trunk: Secondary | ICD-10-CM | POA: Diagnosis not present

## 2016-07-20 DIAGNOSIS — D485 Neoplasm of uncertain behavior of skin: Secondary | ICD-10-CM | POA: Diagnosis not present

## 2016-07-20 DIAGNOSIS — Z08 Encounter for follow-up examination after completed treatment for malignant neoplasm: Secondary | ICD-10-CM | POA: Diagnosis not present

## 2016-07-20 DIAGNOSIS — Z85828 Personal history of other malignant neoplasm of skin: Secondary | ICD-10-CM | POA: Diagnosis not present

## 2016-07-21 ENCOUNTER — Ambulatory Visit (INDEPENDENT_AMBULATORY_CARE_PROVIDER_SITE_OTHER): Payer: Medicare Other | Admitting: Family Medicine

## 2016-07-21 ENCOUNTER — Other Ambulatory Visit: Payer: Self-pay | Admitting: Family Medicine

## 2016-07-21 ENCOUNTER — Ambulatory Visit: Payer: Self-pay | Admitting: Family Medicine

## 2016-07-21 ENCOUNTER — Ambulatory Visit (HOSPITAL_BASED_OUTPATIENT_CLINIC_OR_DEPARTMENT_OTHER)
Admission: RE | Admit: 2016-07-21 | Discharge: 2016-07-21 | Disposition: A | Discharge status: Home / Self Care | Payer: Medicare Other | Source: Ambulatory Visit | Attending: Family Medicine | Admitting: Family Medicine

## 2016-07-21 ENCOUNTER — Encounter: Payer: Self-pay | Admitting: Family Medicine

## 2016-07-21 VITALS — BP 119/64 | HR 73 | Ht 63.0 in | Wt 130.0 lb

## 2016-07-21 DIAGNOSIS — I701 Atherosclerosis of renal artery: Secondary | ICD-10-CM | POA: Diagnosis not present

## 2016-07-21 DIAGNOSIS — Z96643 Presence of artificial hip joint, bilateral: Secondary | ICD-10-CM | POA: Diagnosis not present

## 2016-07-21 DIAGNOSIS — M25552 Pain in left hip: Secondary | ICD-10-CM

## 2016-07-21 DIAGNOSIS — Z96641 Presence of right artificial hip joint: Secondary | ICD-10-CM | POA: Diagnosis not present

## 2016-07-21 NOTE — Patient Instructions (Signed)
We will go ahead with a CT scan of this hip to assess for loosening, stress fracture around the hip joint, other pathology. Continue using the cane. Next steps will depend on those test results.

## 2016-07-23 ENCOUNTER — Other Ambulatory Visit: Payer: Self-pay | Admitting: Family Medicine

## 2016-07-23 DIAGNOSIS — M25552 Pain in left hip: Secondary | ICD-10-CM

## 2016-07-26 ENCOUNTER — Other Ambulatory Visit: Payer: Self-pay | Admitting: Medical

## 2016-07-26 DIAGNOSIS — I10 Essential (primary) hypertension: Secondary | ICD-10-CM

## 2016-07-27 DIAGNOSIS — M25552 Pain in left hip: Secondary | ICD-10-CM | POA: Insufficient documentation

## 2016-07-27 NOTE — Progress Notes (Addendum)
PCP and consultation requested by: Mackie Pai, PA-C  Subjective:   HPI: Patient is a 76 y.o. female here for left hip pain.  Patient reports about 14 years ago she had her left hip replaced. She never completely improved following the replacement. States for 3 years after this she still walked with a cane. Pain is 0/10 at rest but up to 10/10 and sharp at worst anterior to posterior hip. Worse with ambulation. Also bothered by cold weather. No skin changes, numbness. No new injuries.  Past Medical History:  Diagnosis Date  . Abnormal blood pressure    left arm from old brachial artery repair  . Anemia   . Anxiety   . Atherosclerosis of renal artery ([place])   . Attention deficit disorder without mention of hyperactivity   . Basal cell carcinoma of face   . Benign heart murmur   . Carotid artery disease ([place])   . Carotid bruit    Left  . Centrilobular emphysema ([place])   . Chronic bronchitis ([place])   . Depression   . DJD (degenerative joint disease) of hip   . Esophageal reflux   . History of depression   . History of spinal fusion   . Hypertension   . Morton's neuroma    Hx of, Left  . Myalgia and myositis, unspecified   . Other tenosynovitis of hand and wrist   . Peripheral neuropathy   . Personal history of alcoholism ([place])   . Personal history of peptic ulcer disease   . Pulmonary nodule   . Rotator cuff disorder    pain  . Solitary cyst of breast   . Subclavian steal syndrome   . Unspecified hypothyroidism   . Wears glasses     Current Outpatient Prescriptions on File Prior to Visit  Medication Sig Dispense Refill  . alendronate (FOSAMAX) 70 MG tablet Take 1 tablet (70 mg total) by mouth every 7 (seven) days. Take with a full glass of water on an empty stomach. 4 tablet 11  . aspirin 81 MG tablet Take 81 mg by mouth daily.     . DULoxetine (CYMBALTA) 60 MG capsule TAKE ONE CAPSULE BY MOUTH DAILY 30 capsule 5  . ferrous sulfate dried (SLOW FE) 160 (50 FE)  MG TBCR Take 160 mg by mouth 2 (two) times daily. Patient has been taking Once Daily    . gabapentin (NEURONTIN) 600 MG tablet TAKE ONE TABLET BY MOUTH THREE TIMES A DAY (Patient taking differently: TAKE ONE TABLET THREE TIMES DAILY AS NEEDED) 90 tablet 4  . Glucosamine 500 MG TABS Take 1 tablet by mouth 2 (two) times daily. Glucosamine-Chondronton    . levothyroxine (SYNTHROID, LEVOTHROID) 112 MCG tablet TAKE 1 TABLET (112 MCG TOTAL) BY MOUTH DAILY BEFORE BREAKFAST. 30 tablet 3  . OLANZapine (ZYPREXA) 5 MG tablet Take 10 mg by mouth at bedtime.     . predniSONE (DELTASONE) 10 MG tablet 4 tab po day 1, 3 tab po day 2, 2 tab po day 3, 1 tab po day 4 10 tablet 0  . valsartan (DIOVAN) 80 MG tablet Take 1 tablet (80 mg total) by mouth 2 (two) times daily. 180 tablet 0   No current facility-administered medications on file prior to visit.     Past Surgical History:  Procedure Laterality Date  . APPENDECTOMY    . CARPOMETACARPEL SUSPENSION PLASTY Right 08/07/2013   Procedure: CARPOMETACARPEL [facility]) SUSPENSION PLASTY RIGHT THUMB;  Surgeon: Wynonia Sours, MD;  Location: [facility];  Service: Orthopedics;  Laterality: Right;  . CARPOMETACARPEL SUSPENSION PLASTY Left 10/23/2015   Procedure: SUSPENSION PLASTY LEFT THUMB TRAPEZIUM EXCISION ABDUCTOR POLLICIS LONGUS TRANSFER;  Surgeon: Daryll Brod, MD;  Location: [place];  Service: Orthopedics;  Laterality: Left;  . CERVICAL FUSION  2007   Dr. Valli Glance approach  . JOINT REPLACEMENT Left   . Left brachial artery repair of pseudoaneurysm  2001   post a cath  . LUMBAR FUSION  09/2004   T12-L5 (Dr. Patrice Paradise)  . MINOR IRRIGATION AND DEBRIDEMENT OF WOUND Right 08/27/2014   Procedure: MINOR IRRIGATION AND DEBRIDEMENT OF WOUND;  Surgeon: Daryll Brod, MD;  Location: [place];  Service: Orthopedics;  Laterality: Right;  . ORIF TIBIA FRACTURE  2010   Left distal  . Percutanous Transluminal Angioplasty of Renal  Arteris    . REPAIR EXTENSOR TENDON Right 09/26/2014   Procedure: REPAIR EXTENSOR TENDON RIGHT RING FINGER ;  Surgeon: Daryll Brod, MD;  Location: [place];  Service: Orthopedics;  Laterality: Right;  . SHOULDER ARTHROSCOPY  09/2008   Left  . TONSILLECTOMY AND ADENOIDECTOMY    . TOTAL HIP ARTHROPLASTY  2008   left  . WISDOM TOOTH EXTRACTION      Allergies  Allergen Reactions  . Amoxicillin     REACTION: Rash  . Codeine   . Hydrocodone-Acetaminophen     REACTION: itching  . Hydromorphone Hcl   . Morphine And Related Hives and Itching  . Morphine Sulfate     REACTION: unspecified  . Nsaids     Ulcer    Social History   Social History  . Marital status: Single    Spouse name: N/A  . Number of children: Y  . Years of education: N/A   Occupational History  . retired Retired    Engineer, maintenance (IT), had own firm   Social History Main Topics  . Smoking status: Former Smoker    Packs/day: 1.00    Years: 25.00    Types: Cigarettes    Quit date: 03/16/1983  . Smokeless tobacco: Never Used  . Alcohol use Yes     Comment: 2-3 glasses wine daily  . Drug use: No  . Sexual activity: Not on file   Other Topics Concern  . 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                   Family History  Problem Relation Age of Onset  . Adopted: Yes  . Schizophrenia Son        Deceased 84  . Hypertension Daughter        Renal  . Healthy Son        x1  . Healthy Daughter        x2    BP 119/64   Pulse 73   Ht 5\' 3"  (1.6 m)   Wt 130 lb (59 kg)   BMI 23.03 kg/m   Review of Systems: See HPI above.     Objective:  Physical Exam:  Gen: NAD, comfortable in exam room  Left hip: No gross deformity,  swelling, bruising. Minimal TTP greater trochanter - equal to  right trochanteric tenderness. Mod ROM with pain. Strength 5/5 with knee flexion/extension, hip flexion. NVI distally.  Right hip: FROM without pain.   Assessment & Plan:  1. Left hip pain - independently reviewed radiographs and no evidence new fracture, stress fracture, loosening.  CT scan ordered and also without abnormalities, concerning lesions.  She has struggled with pain that by exam seems to come from the hip joint and not referred from back for several years.  We discussed options - will trial diagnostic/therapeutic intraarticular cortisone injection - advised to call us a week after this.  If does not provide any benefit would indicate pain not from the hip joint despite exam.  Then would consider lumbar imaging.  Also discussed physical therapy.  Tylenol if needed.  Addendum:  MRI lumbar spine reviewed and discussed with patient.  Unfortunately nothing to account for her pain.  Radiographs, CT of hip also not noting cause of her pain.  Trial of intraarticular injection did not help.  Concern that she has some nerve damage related to the initial hip replacement surgery (she never improved).  She is going to increase gabapentin to 3 times a day (states only taking it twice).  Will refer to PM&R for evaluation, further recommendations - advised patient they may not have much to add.  Discuss with PCP about escalation of gabapentin if TID dosing not providing adequate relief vs trial instead of nortriptyline, lyrica.

## 2016-07-27 NOTE — Assessment & Plan Note (Signed)
independently reviewed radiographs and no evidence new fracture, stress fracture, loosening.  CT scan ordered and also without abnormalities, concerning lesions.  She has struggled with pain that by exam seems to come from the hip joint and not referred from back for several years.  We discussed options - will trial diagnostic/therapeutic intraarticular cortisone injection - advised to call us a week after this.  If does not provide any benefit would indicate pain not from the hip joint despite exam.  Then would consider lumbar imaging.  Also discussed physical therapy.  Tylenol if needed.

## 2016-08-02 ENCOUNTER — Ambulatory Visit: Payer: Self-pay | Admitting: Family Medicine

## 2016-08-02 ENCOUNTER — Ambulatory Visit
Admission: RE | Admit: 2016-08-02 | Discharge: 2016-08-02 | Disposition: A | Discharge status: Home / Self Care | Payer: Medicare Other | Source: Ambulatory Visit | Attending: Family Medicine | Admitting: Family Medicine

## 2016-08-02 DIAGNOSIS — M25552 Pain in left hip: Secondary | ICD-10-CM | POA: Diagnosis not present

## 2016-08-02 MED ORDER — IOPAMIDOL (ISOVUE-M 200) INJECTION 41%
1.0000 mL | Freq: Once | INTRAMUSCULAR | Status: AC
  Administered 2016-08-02: 1 mL via INTRA_ARTICULAR

## 2016-08-02 MED ORDER — METHYLPREDNISOLONE ACETATE 40 MG/ML INJ SUSP (RADIOLOG
120.0000 mg | Freq: Once | INTRAMUSCULAR | Status: AC
  Administered 2016-08-02: 120 mg via INTRA_ARTICULAR

## 2016-08-03 ENCOUNTER — Other Ambulatory Visit: Payer: Self-pay | Admitting: Medical

## 2016-08-10 ENCOUNTER — Telehealth: Payer: Self-pay | Admitting: Family Medicine

## 2016-08-10 DIAGNOSIS — D225 Melanocytic nevi of trunk: Secondary | ICD-10-CM | POA: Diagnosis not present

## 2016-08-10 NOTE — Telephone Encounter (Signed)
Patient states her left hip has not improved and is requesting a call back from provider.   Patient states she will be busy between 2:30 and 3:30 today

## 2016-08-11 NOTE — Addendum Note (Signed)
Addended by: Sherrie George F on: 08/11/2016 12:29 PM   Modules accepted: Orders

## 2016-08-11 NOTE — Telephone Encounter (Signed)
Spoke to patient yesterday afternoon.  No improvement with shot even temporarily.  Suggests one of two possibilities - nerve damage or impingement outside of the hip joint itself.  Will go ahead with lumbar spine imaging to assess for a lumbar source.  Nevin Bloodgood - can you go ahead with MRI of lumbar spine without contrast for radiculopathy?  Thanks!

## 2016-08-11 NOTE — Telephone Encounter (Signed)
Order placed for MRI. Radiology department to schedule appointment.

## 2016-08-12 ENCOUNTER — Other Ambulatory Visit: Payer: Self-pay | Admitting: Medical

## 2016-08-13 ENCOUNTER — Telehealth: Payer: Self-pay | Admitting: Family Medicine

## 2016-08-13 NOTE — Telephone Encounter (Signed)
Patient calling to check that the MRI scheduled on her spine will be safe considering she has had spinal fusions in the past

## 2016-08-16 NOTE — Telephone Encounter (Signed)
Returned call to patient. States she followed up with Imaging after contacting us on Friday.   Patient would like to have MRI sooner than this Saturday so would like to know if MRI can be switched to a [place] office.   Also, patient wanted to inquire about a temporary handicap placard. Patient states she does not drive but has someone drive her around and she walks with a cane

## 2016-08-16 NOTE — Telephone Encounter (Signed)
Both of these are fine - thanks!

## 2016-08-16 NOTE — Telephone Encounter (Signed)
I believe Rose [name] instructed her to contact Imaging to talk about this.  But she has had an MRI since her fusion so there should not be an issue.

## 2016-08-21 ENCOUNTER — Ambulatory Visit (HOSPITAL_BASED_OUTPATIENT_CLINIC_OR_DEPARTMENT_OTHER)
Admission: RE | Admit: 2016-08-21 | Discharge: 2016-08-21 | Disposition: A | Discharge status: Home / Self Care | Payer: Medicare Other | Source: Ambulatory Visit | Attending: Family Medicine | Admitting: Family Medicine

## 2016-08-21 DIAGNOSIS — M545 Low back pain: Secondary | ICD-10-CM | POA: Diagnosis not present

## 2016-08-21 DIAGNOSIS — M25552 Pain in left hip: Secondary | ICD-10-CM | POA: Insufficient documentation

## 2016-08-24 ENCOUNTER — Other Ambulatory Visit: Payer: Self-pay | Admitting: Medical

## 2016-08-24 DIAGNOSIS — I10 Essential (primary) hypertension: Secondary | ICD-10-CM

## 2016-08-26 NOTE — Addendum Note (Signed)
Addended by: Sherrie George F on: 08/26/2016 11:58 AM   Modules accepted: Orders

## 2016-09-01 ENCOUNTER — Encounter: Payer: Self-pay | Admitting: Physical Medicine & Rehabilitation

## 2016-09-13 ENCOUNTER — Encounter: Payer: Self-pay | Admitting: Family Medicine

## 2016-09-17 ENCOUNTER — Encounter: Payer: Self-pay | Admitting: Family Medicine

## 2016-09-17 ENCOUNTER — Ambulatory Visit (INDEPENDENT_AMBULATORY_CARE_PROVIDER_SITE_OTHER): Payer: Medicare Other | Admitting: Family Medicine

## 2016-09-17 DIAGNOSIS — M25552 Pain in left hip: Secondary | ICD-10-CM | POA: Diagnosis not present

## 2016-09-17 DIAGNOSIS — I701 Atherosclerosis of renal artery: Secondary | ICD-10-CM

## 2016-09-20 ENCOUNTER — Telehealth: Payer: Self-pay | Admitting: Medical

## 2016-09-20 NOTE — Telephone Encounter (Signed)
Relation to VD:PBAQ Call back [place]:  Reason for call:  Patient would like to discuss gabapentin (NEURONTIN) 600 MG tablet patient didn't want to elaborate,please advise

## 2016-09-21 NOTE — Assessment & Plan Note (Signed)
Radiographs, CT scan of hip, MRI lumbar spine did not show cause of her left hip pain.  Trial of intraarticular hip injection without benefit also.  Concern that she has some nerve damage related to the initial hip replacement surgery.  We had discussed increasing gabapentin - this is prescribed by her PCP so advised she discuss with him titrating.  Also had referred to PM&R - has not seen them yet.  Follow up with [place] as needed.  Total visit time 10 minutes - more than half of which spent on counseling, answering questions.

## 2016-09-21 NOTE — Telephone Encounter (Signed)
LMOM with contact name and number for return call RE: discussing her Gabapentin per patient needs/SLS 07/10

## 2016-09-21 NOTE — Progress Notes (Signed)
PCP and consultation requested by: Mackie Pai, PA-C  Subjective:   HPI: Patient is a 76 y.o. female here for left hip pain.  5/9: Patient reports about 14 years ago she had her left hip replaced. She never completely improved following the replacement. States for 3 years after this she still walked with a cane. Pain is 0/10 at rest but up to 10/10 and sharp at worst anterior to posterior hip. Worse with ambulation. Also bothered by cold weather. No skin changes, numbness. No new injuries.  7/6: Patient returns to go over her MRI (she doesn't recall all the details of our phone conversation). Pain 0/10 at rest but gets up to 10/10 and sharp at worst anterior to posterior hip.  Past Medical History:  Diagnosis Date  . Abnormal blood pressure    left arm from old brachial artery repair  . Anemia   . Anxiety   . Atherosclerosis of renal artery ([place])   . Attention deficit disorder without mention of hyperactivity   . Basal cell carcinoma of face   . Benign heart murmur   . Carotid artery disease ([place])   . Carotid bruit    Left  . Centrilobular emphysema ([place])   . Chronic bronchitis ([place])   . Depression   . DJD (degenerative joint disease) of hip   . Esophageal reflux   . History of depression   . History of spinal fusion   . Hypertension   . Morton's neuroma    Hx of, Left  . Myalgia and myositis, unspecified   . Other tenosynovitis of hand and wrist   . Peripheral neuropathy   . Personal history of alcoholism ([place])   . Personal history of peptic ulcer disease   . Pulmonary nodule   . Rotator cuff disorder    pain  . Solitary cyst of breast   . Subclavian steal syndrome   . Unspecified hypothyroidism   . Wears glasses     Current Outpatient Prescriptions on File Prior to Visit  Medication Sig Dispense Refill  . alendronate (FOSAMAX) 70 MG tablet Take 1 tablet (70 mg total) by mouth every 7 (seven) days. Take with a full glass of water on an empty stomach. 4  tablet 11  . amLODipine (NORVASC) 5 MG tablet TAKE ONE TABLET BY MOUTH TWICE A DAY 60 tablet 0  . aspirin 81 MG tablet Take 81 mg by mouth daily.     . cloNIDine (CATAPRES) 0.1 MG tablet TAKE ONE TABLET BY MOUTH THREE TIMES A DAY 90 tablet 1  . DULoxetine (CYMBALTA) 60 MG capsule TAKE ONE CAPSULE BY MOUTH DAILY 30 capsule 5  . ferrous sulfate dried (SLOW FE) 160 (50 FE) MG TBCR Take 160 mg by mouth 2 (two) times daily. Patient has been taking Once Daily    . gabapentin (NEURONTIN) 600 MG tablet TAKE ONE TABLET THREE TIMES A DAY 270 tablet 3  . Glucosamine 500 MG TABS Take 1 tablet by mouth 2 (two) times daily. Glucosamine-Chondronton    . levothyroxine (SYNTHROID, LEVOTHROID) 112 MCG tablet TAKE ONE TABLET BY MOUTH DAILY BEFORE BREAKFAST 90 tablet 2  . OLANZapine (ZYPREXA) 5 MG tablet Take 10 mg by mouth at bedtime.     . predniSONE (DELTASONE) 10 MG tablet 4 tab po day 1, 3 tab po day 2, 2 tab po day 3, 1 tab po day 4 10 tablet 0  . valsartan (DIOVAN) 80 MG tablet Take 1 tablet (80 mg total) by mouth 2 (two) times  daily. 180 tablet 0   No current facility-administered medications on file prior to visit.     Past Surgical History:  Procedure Laterality Date  . APPENDECTOMY    . CARPOMETACARPEL SUSPENSION PLASTY Right 08/07/2013   Procedure: CARPOMETACARPEL [facility]) SUSPENSION PLASTY RIGHT THUMB;  Surgeon: Wynonia Sours, MD;  Location: [place];  Service: Orthopedics;  Laterality: Right;  . CARPOMETACARPEL SUSPENSION PLASTY Left 10/23/2015   Procedure: SUSPENSION PLASTY LEFT THUMB TRAPEZIUM EXCISION ABDUCTOR POLLICIS LONGUS TRANSFER;  Surgeon: Daryll Brod, MD;  Location: [place];  Service: Orthopedics;  Laterality: Left;  . CERVICAL FUSION  2007   Dr. Valli Glance approach  . JOINT REPLACEMENT Left   . Left brachial artery repair of pseudoaneurysm  2001   post a cath  . LUMBAR FUSION  09/2004   T12-L5 (Dr. Patrice Paradise)  . MINOR IRRIGATION AND DEBRIDEMENT OF WOUND  Right 08/27/2014   Procedure: MINOR IRRIGATION AND DEBRIDEMENT OF WOUND;  Surgeon: Daryll Brod, MD;  Location: [place];  Service: Orthopedics;  Laterality: Right;  . ORIF TIBIA FRACTURE  2010   Left distal  . Percutanous Transluminal Angioplasty of Renal Arteris    . REPAIR EXTENSOR TENDON Right 09/26/2014   Procedure: REPAIR EXTENSOR TENDON RIGHT RING FINGER ;  Surgeon: Daryll Brod, MD;  Location: [place];  Service: Orthopedics;  Laterality: Right;  . SHOULDER ARTHROSCOPY  09/2008   Left  . TONSILLECTOMY AND ADENOIDECTOMY    . TOTAL HIP ARTHROPLASTY  2008   left  . WISDOM TOOTH EXTRACTION      Allergies  Allergen Reactions  . Amoxicillin     REACTION: Rash  . Codeine   . Hydrocodone-Acetaminophen     REACTION: itching  . Hydromorphone Hcl   . Morphine And Related Hives and Itching  . Morphine Sulfate     REACTION: unspecified  . Nsaids     Ulcer    Social History   Social History  . Marital status: Single    Spouse name: N/A  . Number of children: Y  . Years of education: N/A   Occupational History  . retired Retired    Engineer, maintenance (IT), had own firm   Social History Main Topics  . Smoking status: Former Smoker    Packs/day: 1.00    Years: 25.00    Types: Cigarettes    Quit date: 03/16/1983  . Smokeless tobacco: Never Used  . Alcohol use Yes     Comment: 2-3 glasses wine daily  . Drug use: No  . Sexual activity: Not on file   Other Topics Concern  . 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                   Family History  Problem Relation Age of Onset  . Adopted: Yes  . Schizophrenia Son        Deceased 6  . Hypertension Daughter        Renal  . Healthy Son         x1  . Healthy Daughter        x2    BP (!) 104/58  Pulse 73   Ht 5\' 3"  (1.6 m)   Wt 132 lb (59.9 kg)   BMI 23.38 kg/m   Review of Systems: See HPI above.     Objective:  Physical Exam:  Gen: NAD, comfortable in exam room  Rest of exam not repeated today.   Assessment & Plan:  1. Left hip pain - Radiographs, CT scan of hip, MRI lumbar spine did not show cause of her left hip pain.  Trial of intraarticular hip injection without benefit also.  Concern that she has some nerve damage related to the initial hip replacement surgery.  We had discussed increasing gabapentin - this is prescribed by her PCP so advised she discuss with him titrating.  Also had referred to PM&R - has not seen them yet.  Follow up with [place] as needed.  Total visit time 10 minutes - more than half of which spent on counseling, answering questions.

## 2016-09-22 ENCOUNTER — Telehealth: Payer: Self-pay | Admitting: Medical

## 2016-09-22 NOTE — Telephone Encounter (Signed)
Would like a 30 minute appointment to discuss options for hip pain. She is already on moderate to high dose neurontin. So not sure want to titrate higher as Dr. Barbaraann Barthel suggested.  Will you schedule her early afternoon if possible 1 pm. On Friday or this coming Monday?

## 2016-09-22 NOTE — Telephone Encounter (Signed)
Spoke with patient RE: Gabapentin, she states that Rose [name] discussed increasing her Gabapentin [no detail-currently 600 mg TID]: Assessment & Plan:  09/17/16   Rose [name]  1. Left hip pain - Radiographs, CT scan of hip, MRI lumbar spine did not show cause of her left hip pain.  Trial of intraarticular hip injection without benefit also.  Concern that she has some nerve damage related to the initial hip replacement surgery.  We had discussed increasing gabapentin - this is prescribed by her PCP so advised she discuss with him titrating.  Also had referred to PM&R - has not seen them yet.  Follow up with [place] as needed. Patient's BP at OV was 104/58, concerns of fluctuating BP with Hypotension. Please Advise/SLS 07/11

## 2016-09-23 ENCOUNTER — Other Ambulatory Visit: Payer: Self-pay | Admitting: Medical

## 2016-09-23 DIAGNOSIS — I10 Essential (primary) hypertension: Secondary | ICD-10-CM

## 2016-09-23 NOTE — Telephone Encounter (Signed)
Please call and schedule an office visit with Rose [name] for Hip Pain.  See previous note.

## 2016-09-24 ENCOUNTER — Telehealth: Payer: Self-pay | Admitting: Medical

## 2016-09-24 NOTE — Telephone Encounter (Signed)
Made several attempts to reach pt. Unable to get through. I sent pt a mychart message to call our office to schedule per PCP

## 2016-09-24 NOTE — Telephone Encounter (Signed)
Completed.

## 2016-09-29 ENCOUNTER — Encounter: Payer: Self-pay | Admitting: Physical Medicine & Rehabilitation

## 2016-09-29 ENCOUNTER — Encounter: Payer: Medicare Other | Attending: Physical Medicine & Rehabilitation | Admitting: Physical Medicine & Rehabilitation

## 2016-09-29 VITALS — BP 102/45 | HR 83 | Resp 14

## 2016-09-29 DIAGNOSIS — M25552 Pain in left hip: Secondary | ICD-10-CM | POA: Diagnosis not present

## 2016-09-29 DIAGNOSIS — G8929 Other chronic pain: Secondary | ICD-10-CM

## 2016-09-29 DIAGNOSIS — M19012 Primary osteoarthritis, left shoulder: Secondary | ICD-10-CM | POA: Insufficient documentation

## 2016-09-29 DIAGNOSIS — R269 Unspecified abnormalities of gait and mobility: Secondary | ICD-10-CM

## 2016-09-29 DIAGNOSIS — Z96642 Presence of left artificial hip joint: Secondary | ICD-10-CM | POA: Diagnosis not present

## 2016-09-29 DIAGNOSIS — K219 Gastro-esophageal reflux disease without esophagitis: Secondary | ICD-10-CM | POA: Insufficient documentation

## 2016-09-29 DIAGNOSIS — E039 Hypothyroidism, unspecified: Secondary | ICD-10-CM | POA: Insufficient documentation

## 2016-09-29 DIAGNOSIS — M7062 Trochanteric bursitis, left hip: Secondary | ICD-10-CM

## 2016-09-29 DIAGNOSIS — I701 Atherosclerosis of renal artery: Secondary | ICD-10-CM

## 2016-09-29 DIAGNOSIS — F319 Bipolar disorder, unspecified: Secondary | ICD-10-CM | POA: Diagnosis not present

## 2016-09-29 DIAGNOSIS — G609 Hereditary and idiopathic neuropathy, unspecified: Secondary | ICD-10-CM

## 2016-09-29 DIAGNOSIS — Z87891 Personal history of nicotine dependence: Secondary | ICD-10-CM | POA: Insufficient documentation

## 2016-09-29 DIAGNOSIS — G458 Other transient cerebral ischemic attacks and related syndromes: Secondary | ICD-10-CM | POA: Diagnosis not present

## 2016-09-29 MED ORDER — GABAPENTIN 600 MG PO TABS: 900.0000 mg | ORAL_TABLET | Freq: Three times a day (TID) | ORAL | 1 refills | Status: DC

## 2016-09-29 MED ORDER — METHOCARBAMOL 500 MG PO TABS: 500.0000 mg | ORAL_TABLET | Freq: Two times a day (BID) | ORAL | 1 refills | Status: DC | PRN

## 2016-09-29 NOTE — Progress Notes (Signed)
Subjective:    Patient ID: Rose [name], female    DOB: Dec 02, 1940, 76 y.o.   MRN: [id number]  HPI 76 y/o female with pmh/psh of idiopathic neuropathy, left shoulder OA, ADD, depression, bipolar disorder, subclavian steal syndrome, lumbar fusion, left THA presents with chronic left hip pain. Started after hip replacement ~2005. Stable.  Rest improves.  Ambulation exacerbates the pain.  Sharp.  Non-radiating.  She was told it was nerve damage from the hip replacement.  Denies associated numbness.  Pt had hip injection in 07/2016 without benefit. ~6 fall in last year stumbling. Pain limits ambulation.   Pain Inventory Average Pain 9 Pain Right Now 4 My pain is intermittent and stabbing  In the last 24 hours, has pain interfered with the following? General activity 0 Relation with others 0 Enjoyment of life 6 What TIME of day is your pain at its worst? varies with activity Sleep (in general) Good  Pain is worse with: walking and standing Pain improves with: rest and pacing activities Relief from Meds: 5  Mobility walk without assistance use a cane how many minutes can you walk? 2-5 ability to climb steps?  yes do you drive?  no transfers alone  Function retired Do you have any goals in this area?  no  Neuro/Psych bladder control problems weakness trouble walking  Prior Studies new visit  Physicians involved in your care new visit   Family History  Problem Relation Age of Onset  . Adopted: Yes  . Schizophrenia Son        Deceased 41  . Hypertension Daughter        Renal  . Healthy Son        x1  . Healthy Daughter        x2   Social History   Social History  . Marital status: Single    Spouse name: N/A  . Number of children: Y  . Years of education: N/A   Occupational History  . retired Retired    Engineer, maintenance (IT), had own firm   Social History Main Topics  . Smoking status: Former Smoker    Packs/day: 1.00    Years: 25.00    Types: Cigarettes    Quit  date: 03/16/1983  . Smokeless tobacco: Never Used  . Alcohol use Yes     Comment: 2-3 glasses wine daily  . Drug use: No  . Sexual activity: Not Asked   Other Topics Concern  . 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                  Past Surgical History:  Procedure Laterality Date  . APPENDECTOMY    . CARPOMETACARPEL SUSPENSION PLASTY Right 08/07/2013   Procedure: CARPOMETACARPEL [facility]) SUSPENSION PLASTY RIGHT THUMB;  Surgeon: Wynonia Sours, MD;  Location: [place];  Service: Orthopedics;  Laterality: Right;  . CARPOMETACARPEL SUSPENSION PLASTY Left 10/23/2015   Procedure: SUSPENSION PLASTY LEFT THUMB TRAPEZIUM EXCISION ABDUCTOR POLLICIS LONGUS TRANSFER;  Surgeon: Daryll Brod, MD;  Location: [place];  Service: Orthopedics;  Laterality: Left;  . CERVICAL FUSION  2007   Dr. Valli Glance approach  . JOINT REPLACEMENT Left   .  Left brachial artery repair of pseudoaneurysm  2001   post a cath  . LUMBAR FUSION  09/2004   T12-L5 (Dr. Patrice Paradise)  . MINOR IRRIGATION AND DEBRIDEMENT OF WOUND Right 08/27/2014   Procedure: MINOR IRRIGATION AND DEBRIDEMENT OF WOUND;  Surgeon: Daryll Brod, MD;  Location: [place];  Service: Orthopedics;  Laterality: Right;  . ORIF TIBIA FRACTURE  2010   Left distal  . Percutanous Transluminal Angioplasty of Renal Arteris    . REPAIR EXTENSOR TENDON Right 09/26/2014   Procedure: REPAIR EXTENSOR TENDON RIGHT RING FINGER ;  Surgeon: Daryll Brod, MD;  Location: [place];  Service: Orthopedics;  Laterality: Right;  . SHOULDER ARTHROSCOPY  09/2008   Left  . TONSILLECTOMY AND ADENOIDECTOMY    . TOTAL HIP ARTHROPLASTY  2008   left  . WISDOM TOOTH  EXTRACTION     Past Medical History:  Diagnosis Date  . Abnormal blood pressure    left arm from old brachial artery repair  . Anemia   . Anxiety   . Atherosclerosis of renal artery ([place])   . Attention deficit disorder without mention of hyperactivity   . Basal cell carcinoma of face   . Benign heart murmur   . Carotid artery disease ([place])   . Carotid bruit    Left  . Centrilobular emphysema ([place])   . Chronic bronchitis ([place])   . Depression   . DJD (degenerative joint disease) of hip   . Esophageal reflux   . History of depression   . History of spinal fusion   . Hypertension   . Morton's neuroma    Hx of, Left  . Myalgia and myositis, unspecified   . Other tenosynovitis of hand and wrist   . Peripheral neuropathy   . Personal history of alcoholism ([place])   . Personal history of peptic ulcer disease   . Pulmonary nodule   . Rotator cuff disorder    pain  . Solitary cyst of breast   . Subclavian steal syndrome   . Unspecified hypothyroidism   . Wears glasses    BP (!) 102/45   Pulse 83   Resp 14   SpO2 90%   Opioid Risk Score:   Fall Risk Score:  `1  Depression screen PHQ 2/9  Depression screen [facility] 2/9 09/29/2016 06/10/2016 04/20/2015  Decreased Interest 0 0 0  Down, Depressed, Hopeless 1 0 0  PHQ - 2 Score 1 0 0  Altered sleeping 0 - -  Tired, decreased energy 0 - -  Change in appetite 0 - -  Feeling bad or failure about yourself  1 - -  Trouble concentrating 1 - -  Moving slowly or fidgety/restless 0 - -  Suicidal thoughts 0 - -  PHQ-9 Score 3 - -  Difficult doing work/chores Not difficult at all - -  Some recent data might be hidden    Review of Systems  Constitutional: Negative.   HENT: Negative.   Eyes: Negative.   Respiratory: Negative.   Cardiovascular: Negative.   Gastrointestinal: Negative.   Endocrine: Negative.   Genitourinary: Negative.   Musculoskeletal: Positive for arthralgias and back pain.  Skin: Negative.   Allergic/Immunologic:  Negative.   Neurological: Negative.   Hematological: Negative.   Psychiatric/Behavioral: Negative.   All other systems reviewed and are negative.      Objective:   Physical Exam Gen: NAD. Vital signs reviewed HENT: Normocephalic, Atraumatic Eyes: EOMI. No discharge.  Cardio: RRR. No JVD. Pulm: B/l  clear to auscultation.  Effort normal Abd: Soft, BS+ MSK:  Gait wide based.   Mild TTP left greater trochanter.    No edema.   Neg FABERs.   No pain with hip ER/IR Neuro:   Sensation intact to light touch in all LE dermatomes  Strength  4+/5 in all LE myotomes  SLR neg b/l Skin: Warm and Dry. Intact    Assessment & Plan:  76 y/o female with pmh/psh of idiopathic neuropathy, left shoulder OA, ADD, depression, bipolar disorder, subclavian steal syndrome, lumbar fusion, left THA presents with chronic left hip pain.   1. Chronic left hip pain  CT reviewed from 5/18 showing postoperative THA changes  MRI L-spine reviewed, mild changes  Labs reviewed  Referral information reviewed  NCCSRS reviewed  Trial Voltaren gel  Trial Heat/Cold  Will order TENS  Will increase Gabapentin 900 TID  Willl order Robaxin 500 BID PRN  Will consider Mobic, but will try to avoid due to renal function  Will consider PT in future   2. Gait abnormality  Cont cane for safety  3. Left greater trochanteric bursitis  Will schedule for injection with anesthetic  4. Idiopathic Neuropathy  Gabapentin ordered

## 2016-09-30 ENCOUNTER — Other Ambulatory Visit: Payer: Self-pay | Admitting: Family Medicine

## 2016-10-11 ENCOUNTER — Telehealth: Payer: Self-pay | Admitting: *Deleted

## 2016-10-11 MED ORDER — BACLOFEN 10 MG PO TABS: 10.0000 mg | ORAL_TABLET | Freq: Three times a day (TID) | ORAL | 0 refills | Status: DC | PRN

## 2016-10-11 NOTE — Telephone Encounter (Signed)
We can switch her to Baclofen 10 TID PRN.  Thanks

## 2016-10-11 NOTE — Telephone Encounter (Signed)
Order placed and Mrs Dancel notified.

## 2016-10-11 NOTE — Addendum Note (Signed)
Addended by: Caro Hight on: 10/11/2016 10:45 AM   Modules accepted: Orders

## 2016-10-11 NOTE — Telephone Encounter (Signed)
Rose [name] called and reported that her muscle relaxer (methocarbamol) was denied by insurance. (it has also been on backorder with pharmacies). Please advise.

## 2016-10-15 ENCOUNTER — Telehealth: Payer: Self-pay | Admitting: *Deleted

## 2016-10-15 NOTE — Telephone Encounter (Signed)
Rose [name] called and says that the baclofen makes he sleepy and she had no such problems with the methocarbamol.  She is asking that we try and get the methocarbamol approved from insurance.  I explained it ma take 3 days to get a response and in the mean time she should half the baclofen for now and see if that help with the sleepiness.

## 2016-10-18 NOTE — Telephone Encounter (Signed)
Thanks

## 2016-10-20 ENCOUNTER — Encounter: Payer: Medicare Other | Attending: Physical Medicine & Rehabilitation | Admitting: Physical Medicine & Rehabilitation

## 2016-10-20 DIAGNOSIS — G458 Other transient cerebral ischemic attacks and related syndromes: Secondary | ICD-10-CM | POA: Insufficient documentation

## 2016-10-20 DIAGNOSIS — M7062 Trochanteric bursitis, left hip: Secondary | ICD-10-CM | POA: Insufficient documentation

## 2016-10-20 DIAGNOSIS — M25552 Pain in left hip: Secondary | ICD-10-CM | POA: Insufficient documentation

## 2016-10-20 DIAGNOSIS — Z87891 Personal history of nicotine dependence: Secondary | ICD-10-CM | POA: Insufficient documentation

## 2016-10-20 DIAGNOSIS — G609 Hereditary and idiopathic neuropathy, unspecified: Secondary | ICD-10-CM | POA: Insufficient documentation

## 2016-10-20 DIAGNOSIS — K219 Gastro-esophageal reflux disease without esophagitis: Secondary | ICD-10-CM | POA: Insufficient documentation

## 2016-10-20 DIAGNOSIS — R269 Unspecified abnormalities of gait and mobility: Secondary | ICD-10-CM | POA: Insufficient documentation

## 2016-10-20 DIAGNOSIS — G8929 Other chronic pain: Secondary | ICD-10-CM | POA: Insufficient documentation

## 2016-10-20 DIAGNOSIS — F319 Bipolar disorder, unspecified: Secondary | ICD-10-CM | POA: Insufficient documentation

## 2016-10-20 DIAGNOSIS — Z96642 Presence of left artificial hip joint: Secondary | ICD-10-CM | POA: Insufficient documentation

## 2016-10-20 DIAGNOSIS — E039 Hypothyroidism, unspecified: Secondary | ICD-10-CM | POA: Insufficient documentation

## 2016-10-20 DIAGNOSIS — M19012 Primary osteoarthritis, left shoulder: Secondary | ICD-10-CM | POA: Insufficient documentation

## 2016-10-23 ENCOUNTER — Telehealth: Payer: Self-pay

## 2016-10-23 NOTE — Telephone Encounter (Signed)
Pt called requesting medication change for the Valsartan, pt uses [facility]... Please advise

## 2016-10-23 NOTE — Telephone Encounter (Signed)
Not emergent need to change.. Forward pt request to PCP. 

## 2016-10-24 ENCOUNTER — Telehealth: Payer: Self-pay | Admitting: Medical

## 2016-10-24 MED ORDER — OLMESARTAN MEDOXOMIL 40 MG PO TABS: 40.0000 mg | ORAL_TABLET | Freq: Every day | ORAL | 3 refills | Status: DC

## 2016-10-24 NOTE — Telephone Encounter (Signed)
Notify pt sent in benicar in place of valsartan.

## 2016-10-25 NOTE — Telephone Encounter (Signed)
Pt.notified

## 2016-10-26 ENCOUNTER — Telehealth: Payer: Self-pay

## 2016-10-26 NOTE — Telephone Encounter (Signed)
PA initiated via Covermymeds; KEY: [id number]. Awaiting determination.

## 2016-10-27 ENCOUNTER — Other Ambulatory Visit: Payer: Self-pay | Admitting: Medical

## 2016-10-27 DIAGNOSIS — I10 Essential (primary) hypertension: Secondary | ICD-10-CM

## 2016-10-27 NOTE — Telephone Encounter (Signed)
PA approved through 03/14/2017.  

## 2016-11-04 ENCOUNTER — Telehealth: Payer: Self-pay | Admitting: Medical

## 2016-11-04 NOTE — Telephone Encounter (Signed)
Relation to HY:WVPX Call back [phone]   Reason for call:  Patient requesting handicap place card renewal, please advise

## 2016-11-05 NOTE — Telephone Encounter (Signed)
Left pt a message to call back. 

## 2016-11-08 ENCOUNTER — Telehealth: Payer: Self-pay | Admitting: Medical

## 2016-11-08 NOTE — Telephone Encounter (Signed)
Notified pt form is ready for pick up at front desk.

## 2016-11-08 NOTE — Telephone Encounter (Signed)
Patient requesting application for disability placard be filled out.  I reviewed her records and she does have chronic lt  hip pain.  History of hi replacement with nerve damage from the hip replacement. So filled out th form and checked off severely  limited due to orthopedic condition. We'll give the orm to [facility] so she can faxed to patient the patient can pick up.

## 2016-11-08 NOTE — Telephone Encounter (Signed)
Spoke with pt Will give form to PCP to fill out.

## 2016-11-10 ENCOUNTER — Encounter: Payer: Medicare Other | Admitting: Physical Medicine & Rehabilitation

## 2016-11-19 ENCOUNTER — Other Ambulatory Visit: Payer: Self-pay | Admitting: *Deleted

## 2016-11-19 ENCOUNTER — Encounter: Payer: Self-pay | Admitting: Physical Medicine & Rehabilitation

## 2016-11-19 ENCOUNTER — Encounter: Payer: Medicare Other | Attending: Physical Medicine & Rehabilitation | Admitting: Physical Medicine & Rehabilitation

## 2016-11-19 VITALS — BP 117/62 | HR 70 | Resp 14

## 2016-11-19 DIAGNOSIS — M7062 Trochanteric bursitis, left hip: Secondary | ICD-10-CM | POA: Diagnosis not present

## 2016-11-19 DIAGNOSIS — R269 Unspecified abnormalities of gait and mobility: Secondary | ICD-10-CM | POA: Insufficient documentation

## 2016-11-19 DIAGNOSIS — G458 Other transient cerebral ischemic attacks and related syndromes: Secondary | ICD-10-CM | POA: Diagnosis not present

## 2016-11-19 DIAGNOSIS — Z96642 Presence of left artificial hip joint: Secondary | ICD-10-CM | POA: Diagnosis not present

## 2016-11-19 DIAGNOSIS — M25552 Pain in left hip: Secondary | ICD-10-CM | POA: Diagnosis not present

## 2016-11-19 DIAGNOSIS — M19012 Primary osteoarthritis, left shoulder: Secondary | ICD-10-CM | POA: Diagnosis not present

## 2016-11-19 DIAGNOSIS — G8929 Other chronic pain: Secondary | ICD-10-CM | POA: Diagnosis not present

## 2016-11-19 DIAGNOSIS — F319 Bipolar disorder, unspecified: Secondary | ICD-10-CM | POA: Diagnosis not present

## 2016-11-19 DIAGNOSIS — E039 Hypothyroidism, unspecified: Secondary | ICD-10-CM | POA: Insufficient documentation

## 2016-11-19 DIAGNOSIS — G609 Hereditary and idiopathic neuropathy, unspecified: Secondary | ICD-10-CM | POA: Insufficient documentation

## 2016-11-19 DIAGNOSIS — Z87891 Personal history of nicotine dependence: Secondary | ICD-10-CM | POA: Insufficient documentation

## 2016-11-19 DIAGNOSIS — K219 Gastro-esophageal reflux disease without esophagitis: Secondary | ICD-10-CM | POA: Insufficient documentation

## 2016-11-19 MED ORDER — GABAPENTIN 300 MG PO CAPS: 300.0000 mg | ORAL_CAPSULE | Freq: Three times a day (TID) | ORAL | 0 refills | Status: DC

## 2016-11-19 NOTE — Progress Notes (Signed)
Left greater trochanteric bursa injection  Indication: Trochanteric bursa syndrome not relieved by medication management and other conservative care.  Informed consent was obtained after describing risks and benefits of the procedure with the patient, this includes bleeding, bruising, infection and medication side effects. The patient wishes to proceed and has given written consent. Patient was placed in a seated position. The left greater trochanteric area was marked and prepped with betadine at the point of maximal tenderness. Vapocoolant spray applied. A 25-gauge 1-1/2 inch needle was inserted into the greater trochanteric area until bone was felt.  Needle was drawn back and after negative draw back for blood, 4cc of 0.5 % marcaine was injected. A band aid was applied. The patient tolerated the procedure well. Post procedure instructions were given.

## 2016-12-02 ENCOUNTER — Ambulatory Visit: Payer: Medicare Other | Admitting: Physical Medicine & Rehabilitation

## 2016-12-02 ENCOUNTER — Encounter: Payer: Medicare Other | Admitting: Physical Medicine & Rehabilitation

## 2016-12-03 ENCOUNTER — Telehealth: Payer: Self-pay | Admitting: Medical

## 2016-12-03 DIAGNOSIS — I10 Essential (primary) hypertension: Secondary | ICD-10-CM

## 2016-12-03 NOTE — Telephone Encounter (Signed)
Pt due for follow up please call and schedule appointment.  

## 2016-12-07 DIAGNOSIS — L570 Actinic keratosis: Secondary | ICD-10-CM | POA: Diagnosis not present

## 2016-12-07 DIAGNOSIS — L821 Other seborrheic keratosis: Secondary | ICD-10-CM | POA: Diagnosis not present

## 2016-12-09 NOTE — Telephone Encounter (Signed)
Pt has been scheduled.  °

## 2016-12-13 ENCOUNTER — Ambulatory Visit: Payer: Self-pay | Admitting: Medical

## 2016-12-13 DIAGNOSIS — Z0289 Encounter for other administrative examinations: Secondary | ICD-10-CM

## 2016-12-16 ENCOUNTER — Ambulatory Visit (INDEPENDENT_AMBULATORY_CARE_PROVIDER_SITE_OTHER): Payer: Medicare Other | Admitting: Medical

## 2016-12-16 DIAGNOSIS — I701 Atherosclerosis of renal artery: Secondary | ICD-10-CM | POA: Diagnosis not present

## 2016-12-16 DIAGNOSIS — I10 Essential (primary) hypertension: Secondary | ICD-10-CM | POA: Diagnosis not present

## 2016-12-16 DIAGNOSIS — M25552 Pain in left hip: Secondary | ICD-10-CM | POA: Diagnosis not present

## 2016-12-16 MED ORDER — TRAMADOL HCL 50 MG PO TABS: 50.0000 mg | ORAL_TABLET | Freq: Four times a day (QID) | ORAL | 0 refills | Status: DC | PRN

## 2016-12-16 NOTE — Patient Instructions (Addendum)
For your left hip pain that has been resistant to various treatments, I will prescribe tramadol. You have been on this before with no prior reports side effects/allergy. I would strongly recommend he use this as infrequently as possible. Try to just use it 1 tablet a day occasionally when you know you will be ambulating/doing errands etc. If this works then will have you signed controlled medication contract/follow required laws.  Your blood pressure is a little high today but better you check at home. Continue current BP med regimen.  Do want to get metabolic panel today as it has been sometime since last checked.  Follow-up in 3 months or as needed.

## 2016-12-16 NOTE — Progress Notes (Signed)
Subjective:    Patient ID: Rose [name], female    DOB: 1940-09-24, 76 y.o.   MRN: [id number]  HPI  Pt in for follow up.  Pt had some hip pain in past with specialist. Pt states has had imaging studies in past. Pt has seen Dr. Allena Katz physical medicine in past. She missed the follow up. Pt had injection for trochanteric bursitis. Pt states it did not help. She does not want to return to sports med presently.  Dr Andree Coss thought she had nerve pain related to prior surgery. He had advised to take gabapentin and did not help either. She states she stopped gabapentin.  Pt also tried accupuncture and did not help.  She states pain in hip occurs when walks. When sits down pain goes away.  Pt bp at home is usually 120/60. She checks about every 10 days.   Pt had strattera written by NP psychiatrist.    Review of Systems  Constitutional: Negative for chills and fatigue.  Respiratory: Negative for cough, chest tightness, shortness of breath and wheezing.   Cardiovascular: Negative for chest pain and palpitations.  Gastrointestinal: Negative for abdominal pain.  Genitourinary: Negative for dysuria, flank pain and frequency.  Musculoskeletal:       Lt hip pain.  Neurological: Negative for dizziness, seizures, weakness, numbness and headaches.  Hematological: Negative for adenopathy. Does not bruise/bleed easily.  Psychiatric/Behavioral: Negative for behavioral problems, confusion and dysphoric mood. The patient is not nervous/anxious.     Past Medical History:  Diagnosis Date  . Abnormal blood pressure    left arm from old brachial artery repair  . Anemia   . Anxiety   . Atherosclerosis of renal artery (HCC)   . Attention deficit disorder without mention of hyperactivity   . Basal cell carcinoma of face   . Benign heart murmur   . Carotid artery disease (HCC)   . Carotid bruit    Left  . Centrilobular emphysema (HCC)   . Chronic bronchitis (HCC)   . Depression   . DJD  (degenerative joint disease) of hip   . Esophageal reflux   . History of depression   . History of spinal fusion   . Hypertension   . Morton's neuroma    Hx of, Left  . Myalgia and myositis, unspecified   . Other tenosynovitis of hand and wrist   . Peripheral neuropathy   . Personal history of alcoholism (HCC)   . Personal history of peptic ulcer disease   . Pulmonary nodule   . Rotator cuff disorder    pain  . Solitary cyst of breast   . Subclavian steal syndrome   . Unspecified hypothyroidism   . Wears glasses      Social History   Social History  . Marital status: Single    Spouse name: N/A  . Number of children: Y  . Years of education: N/A   Occupational History  . retired Retired    IT trainer, had own firm   Social History Main Topics  . Smoking status: Former Smoker    Packs/day: 1.00    Years: 25.00    Types: Cigarettes    Quit date: 03/16/1983  . Smokeless tobacco: Never Used  . Alcohol use Yes     Comment: 2-3 glasses wine daily  . Drug use: No  . Sexual activity: Not on file   Other Topics Concern  . Not on file   Social History Narrative   [employer]-accounting  Married '60-82yr divorced; '84-2 years, divorced      3 daughters- '61, '70, '71; 2 sons-'53,'55 (schizophrenic-died); 10 grandchildren      Lives alone with 1 dog and 3 cats      Pt unsure of family history- was adopted.                   Past Surgical History:  Procedure Laterality Date  . APPENDECTOMY    . CARPOMETACARPEL SUSPENSION PLASTY Right 08/07/2013   Procedure: CARPOMETACARPEL [facility]) SUSPENSION PLASTY RIGHT THUMB;  Surgeon: Nicki Reaper, MD;  Location: [facility];  Service: Orthopedics;  Laterality: Right;  . CARPOMETACARPEL SUSPENSION PLASTY Left 10/23/2015   Procedure: SUSPENSION PLASTY LEFT THUMB TRAPEZIUM EXCISION ABDUCTOR POLLICIS LONGUS TRANSFER;  Surgeon: Cindee Salt, MD;  Location: [facility];  Service: Orthopedics;   Laterality: Left;  . CERVICAL FUSION  2007   Dr. Maylene Roes approach  . JOINT REPLACEMENT Left   . Left brachial artery repair of pseudoaneurysm  2001   post a cath  . LUMBAR FUSION  09/2004   T12-L5 (Dr. Noel Gerold)  . MINOR IRRIGATION AND DEBRIDEMENT OF WOUND Right 08/27/2014   Procedure: MINOR IRRIGATION AND DEBRIDEMENT OF WOUND;  Surgeon: Cindee Salt, MD;  Location: [facility];  Service: Orthopedics;  Laterality: Right;  . ORIF TIBIA FRACTURE  2010   Left distal  . Percutanous Transluminal Angioplasty of Renal Arteris    . REPAIR EXTENSOR TENDON Right 09/26/2014   Procedure: REPAIR EXTENSOR TENDON RIGHT RING FINGER ;  Surgeon: Cindee Salt, MD;  Location: [facility];  Service: Orthopedics;  Laterality: Right;  . SHOULDER ARTHROSCOPY  09/2008   Left  . TONSILLECTOMY AND ADENOIDECTOMY    . TOTAL HIP ARTHROPLASTY  2008   left  . WISDOM TOOTH EXTRACTION      Family History  Problem Relation Age of Onset  . Adopted: Yes  . Schizophrenia Son        Deceased 67  . Hypertension Daughter        Renal  . Healthy Son        x1  . Healthy Daughter        x2    Allergies  Allergen Reactions  . Amoxicillin     REACTION: Rash  . Codeine   . Hydrocodone-Acetaminophen     REACTION: itching  . Hydromorphone Hcl   . Morphine And Related Hives and Itching  . Morphine Sulfate     REACTION: unspecified  . Nsaids     Ulcer    Current Outpatient Prescriptions on File Prior to Visit  Medication Sig Dispense Refill  . amLODipine (NORVASC) 5 MG tablet TAKE ONE TABLET BY MOUTH TWO TIMES A DAY 60 tablet 0  . aspirin 81 MG tablet Take 81 mg by mouth daily.     . cloNIDine (CATAPRES) 0.1 MG tablet TAKE ONE TABLET BY MOUTH THREE TIMES A DAY 90 tablet 0  . DULoxetine (CYMBALTA) 60 MG capsule TAKE ONE CAPSULE BY MOUTH DAILY 30 capsule 5  . ferrous sulfate dried (SLOW FE) 160 (50 FE) MG TBCR Take 160 mg by mouth 2 (two) times daily. Patient has been taking Once  Daily    . gabapentin (NEURONTIN) 300 MG capsule Take 1 capsule (300 mg total) by mouth 3 (three) times daily. 270 capsule 0  . Glucosamine 500 MG TABS Take 1 tablet by mouth 2 (two) times daily. Glucosamine-Chondronton    . levothyroxine (SYNTHROID, LEVOTHROID) 112  MCG tablet TAKE ONE TABLET BY MOUTH DAILY BEFORE BREAKFAST 90 tablet 2  . OLANZapine (ZYPREXA) 5 MG tablet Take 15 mg by mouth at bedtime.     Marland Kitchen olmesartan (BENICAR) 40 MG tablet Take 1 tablet (40 mg total) by mouth daily. 30 tablet 3   No current facility-administered medications on file prior to visit.     There were no vitals taken for this visit.      Objective:   Physical Exam  General- No acute distress. Pleasant patient. Neck- Full range of motion, no jvd Lungs- Clear, even and unlabored. Heart- regular rate and rhythm. Neurologic- CNII- XII grossly intact.  Left hip- pain medial aspect on palpation and on range of motion.      Assessment & Plan:  For your left hip pain that has been resistant to various treatments, I will prescribe tramadol. You have been on this before with no prior reports side effects/allergy. I would strongly recommend he use this as infrequently as possible. Try to just use it 1 tablet a day occasionally when you know you will be ambulating/doing errands etc. If this works then will have you signed controlled medication contract/follow required laws.  Your blood pressure is a little high today but better you check at home. Continue current BP med regimen.  Do want to get metabolic panel today as it has been sometime since last checked.  Follow-up in 3 months or as needed.  Flu vaccine and pneumovaccine declined.  Tamakia Porto, Ramon Dredge, PA-C

## 2017-01-01 ENCOUNTER — Other Ambulatory Visit: Payer: Self-pay | Admitting: Family Medicine

## 2017-01-01 ENCOUNTER — Other Ambulatory Visit: Payer: Self-pay | Admitting: Medical

## 2017-01-01 DIAGNOSIS — I10 Essential (primary) hypertension: Secondary | ICD-10-CM

## 2017-01-15 ENCOUNTER — Other Ambulatory Visit: Payer: Self-pay | Admitting: Family Medicine

## 2017-01-17 NOTE — Telephone Encounter (Signed)
Rx was sent to pharmacy in chart.

## 2017-01-17 NOTE — Telephone Encounter (Signed)
Self.   Refill request for cloNIDine - 90 day supply    [facility]: [facility] [address] - [place], [place]. [address]   Could you call pt when filled, thanks.

## 2017-02-04 ENCOUNTER — Other Ambulatory Visit: Payer: Self-pay | Admitting: Medical

## 2017-02-04 ENCOUNTER — Other Ambulatory Visit: Payer: Self-pay | Admitting: Physical Medicine & Rehabilitation

## 2017-02-04 DIAGNOSIS — I10 Essential (primary) hypertension: Secondary | ICD-10-CM

## 2017-02-08 ENCOUNTER — Other Ambulatory Visit: Payer: Self-pay | Admitting: Emergency Medicine

## 2017-02-08 DIAGNOSIS — I10 Essential (primary) hypertension: Secondary | ICD-10-CM

## 2017-02-08 MED ORDER — OLMESARTAN MEDOXOMIL 40 MG PO TABS: 40.0000 mg | ORAL_TABLET | Freq: Every day | ORAL | 0 refills | Status: DC

## 2017-02-08 MED ORDER — AMLODIPINE BESYLATE 5 MG PO TABS: 5.0000 mg | ORAL_TABLET | Freq: Two times a day (BID) | ORAL | 0 refills | Status: DC

## 2017-02-09 ENCOUNTER — Telehealth: Payer: Self-pay | Admitting: Medical

## 2017-02-09 NOTE — Telephone Encounter (Signed)
Copied from [place] [phone]. Topic: Quick Communication - See Telephone Encounter >> Feb 09, 2017  4:17 PM Aurelio Brash B wrote: CRM for notification. See Telephone encounter for:  Pt needs prescription updated for clonidine  -  should be 90 days  but  pharm can only fill for [facility]   02/09/17.

## 2017-02-10 MED ORDER — CLONIDINE HCL 0.1 MG PO TABS: 0.1000 mg | ORAL_TABLET | Freq: Three times a day (TID) | ORAL | 2 refills | Status: DC

## 2017-02-10 NOTE — Telephone Encounter (Signed)
Rx sent 

## 2017-03-05 ENCOUNTER — Other Ambulatory Visit: Payer: Self-pay | Admitting: Physical Medicine & Rehabilitation

## 2017-03-18 ENCOUNTER — Ambulatory Visit (INDEPENDENT_AMBULATORY_CARE_PROVIDER_SITE_OTHER): Payer: Medicare Other | Admitting: Gynecology

## 2017-03-18 ENCOUNTER — Encounter: Payer: Self-pay | Admitting: Gynecology

## 2017-03-18 VITALS — BP 124/82 | Ht 62.0 in | Wt 132.0 lb

## 2017-03-18 DIAGNOSIS — N952 Postmenopausal atrophic vaginitis: Secondary | ICD-10-CM | POA: Diagnosis not present

## 2017-03-18 DIAGNOSIS — M810 Age-related osteoporosis without current pathological fracture: Secondary | ICD-10-CM | POA: Diagnosis not present

## 2017-03-18 DIAGNOSIS — Z124 Encounter for screening for malignant neoplasm of cervix: Secondary | ICD-10-CM | POA: Diagnosis not present

## 2017-03-18 DIAGNOSIS — N3281 Overactive bladder: Secondary | ICD-10-CM

## 2017-03-18 DIAGNOSIS — Z01411 Encounter for gynecological examination (general) (routine) with abnormal findings: Secondary | ICD-10-CM

## 2017-03-18 MED ORDER — OXYBUTYNIN CHLORIDE ER 10 MG PO TB24: 10.0000 mg | ORAL_TABLET | Freq: Every day | ORAL | 11 refills | Status: DC

## 2017-03-18 MED ORDER — ALENDRONATE SODIUM 70 MG PO TABS: 70.0000 mg | ORAL_TABLET | ORAL | 11 refills | Status: DC

## 2017-03-18 NOTE — Addendum Note (Signed)
Addended by: Nelva Nay on: 03/18/2017 03:38 PM   Modules accepted: Orders

## 2017-03-18 NOTE — Progress Notes (Signed)
Rose [name] 1940/07/22 [id number]        76 y.o.  G6P6 new patient who has not had a gynecologic exam in a number of years for breast and pelvic exam and to discuss several issues:  1. Urinary incontinence.  Patient has urge urinary incontinence where whenever she has the urge to go to the bathroom she has to go quickly or she will become incontinent.  No certain foods and drinks will make her symptoms worse.  Has learned to manage it by frequent voiding without waiting for the urge.  Wears protection throughout the day as well as nighttime.  No stress symptoms such as loss of urine with coughing laughing sneezing.  No spontaneous unprovoked leakage of urine.  No recurrent UTIs.  Has tried several different medications and currently is on Ditropan XL.  Had tried Vesicare and Myrbetriq in the past. 2. DEXA 10/2015 T score -2.9.  Telephone note attached to the result had recommended Fosamax but the patient never started and does not remember being instructed to.  Past medical history,surgical history, problem list, medications, allergies, family history and social history were all reviewed and documented as reviewed in the EPIC chart.  ROS:  Performed with pertinent positives and negatives included in the history, assessment and plan.   Additional significant findings : None   Exam: Caryn Bee assistant Vitals:   03/18/17 1419  BP: 124/82  Weight: 132 lb (59.9 kg)  Height: 5\' 2"  (1.575 m)   Body mass index is 24.14 kg/m.  General appearance:  Normal affect, orientation and appearance. Skin: Grossly normal HEENT: Without gross lesions.  No cervical or supraclavicular adenopathy. Thyroid normal.  Lungs:  Clear without wheezing, rales or rhonchi Cardiac: RR, without RMG Abdominal:  Soft, nontender, without masses, guarding, rebound, organomegaly or hernia Breasts:  Examined lying and sitting without masses, retractions, discharge or axillary adenopathy. Pelvic:  Ext, BUS, Vagina:  With atrophic changes.  No significant cystocele rectocele  Cervix: With atrophic changes.  Pap smear done  Uterus: Anteverted, normal size, shape and contour, midline and mobile nontender   Adnexa: Without masses or tenderness    Anus and perineum: Normal   Rectovaginal: Normal sphincter tone without palpated masses or tenderness.    Assessment/Plan:  77 y.o. G79P6 female for breast and pelvic exam  1. Urge incontinence.  Classic by description.  Exam without evidence of pelvic relaxation.  Check baseline urine analysis.  Options for management reviewed to include staying the course and doing what she is doing now, just trying a different medication although she has pretty much tried what is available and lastly referral to urology.  I reviewed the whole issue of urge incontinence with her and the issue of avoiding foods and liquids that will make her symptoms worse, frequent voiding before urge develops and continuing on her medication all reviewed.  At this point the patient would prefer to stay the course and declines urologic referral. 2. Osteoporosis.  T score -2.9 on last DEXA.  Uses cane and has a history of falling.  High risk for fracture discussed with her.  Treatment options were reviewed to include bisphosphate's and Prolia.  At this point do not feel Forteo indicated.  Patient wants to go ahead and start alendronate 70 mg weekly.  I reviewed how to take the medication.  We discussed the risks to include GERD, osteonecrosis of the jaw and atypical fractures.  Patient will go ahead and initiate and will call me if she  has any issues doing this.  Recommend follow-up bone density in 1-2 years after initiating treatment.  Increased calcium vitamin D discussed. 3. Mammography 2017.  Recommend baseline mammogram now.  Breast exam normal today.  SBE monthly reviewed. 4. Pap smear 2006.  Pap smear done today.  No history of significant abnormal Pap smears. 5. Colonoscopy 2004.  Colonoscopy  recommended. 6. Health maintenance.  No routine lab work done as patient does this elsewhere.  Follow-up in 1 year, sooner as needed.   Anastasio Auerbach MD, 3:00 PM 03/18/2017

## 2017-03-18 NOTE — Patient Instructions (Signed)
Start on the Fosamax medication 1 pill weekly as we discussed for the osteoporosis.  Call me if you have any issues with that.

## 2017-03-20 LAB — URINALYSIS W MICROSCOPIC + REFLEX CULTURE
Bilirubin Urine: NEGATIVE
Glucose, UA: NEGATIVE
Hgb urine dipstick: NEGATIVE
Ketones, ur: NEGATIVE
Nitrites, Initial: NEGATIVE
PH: 5.5 (ref 5.0–8.0)
SPECIFIC GRAVITY, URINE: 1.018 (ref 1.001–1.03)

## 2017-03-20 LAB — URINE CULTURE
MICRO NUMBER: 90019955
SPECIMEN QUALITY: ADEQUATE

## 2017-03-20 LAB — CULTURE INDICATED

## 2017-03-21 LAB — PAP IG W/ RFLX HPV ASCU

## 2017-04-07 ENCOUNTER — Other Ambulatory Visit: Payer: Self-pay | Admitting: Physical Medicine & Rehabilitation

## 2017-04-07 ENCOUNTER — Other Ambulatory Visit: Payer: Self-pay | Admitting: Medical

## 2017-04-07 DIAGNOSIS — I10 Essential (primary) hypertension: Secondary | ICD-10-CM

## 2017-05-04 ENCOUNTER — Telehealth: Payer: Self-pay | Admitting: Medical

## 2017-05-04 NOTE — Telephone Encounter (Signed)
Presently I do not agree with refilling the Wellbutrin/bupropion.  Actually looked at my notes and I do not see any mention of prescribing that.  I did see that the last prescription in epic was historical and there is some notes about Lucendia Herrlich CMA made the note or is it possible she wrote the prescription under my name??  She works with Dr. Phineas Real.  The day that historical note was made January 4 th  patient had appointment with gynecologist.  So to me it looks like other provider prescribed.  The NPI number on that note in epic is not my NPI.  May be the pharmacist accidentally put my name on rx?  I do not know.  But what I can see is she has Strattera on her medication list and it is unclear whether or not she is still on Cymbalta.  Patient also saw NP/psychiatrist in the past.  Multiple medications like this can overstimulate serotonin system/cause serotonin syndrome and she needs to be seen.  Please do not prescribe Wellbutrin under my name.  She needs an office visit.  I have not seen her since October.  And on that note we did not talk about the ED, mood ,anxiety or depression.  Please schedule her appointment with me some day next week.  Try to avoid Thursdays.  Also try to offer her early morning or early afternoon appointment.  In addition would request a 30-minute slot.

## 2017-05-04 NOTE — Telephone Encounter (Signed)
Pt is request for Bupropin last rx is 10/29/2015 medication is listed as historical.  Pt states she has been taking medication for a long time   Please advise.

## 2017-05-04 NOTE — Telephone Encounter (Signed)
Copied from [place] [phone]. Topic: Quick Communication - Rx Refill/Question >> May 04, 2017  1:10 PM Celedonio Savage L wrote: Medication: BuPROPion HCl (WELLBUTRIN PO)   Has the patient contacted their pharmacy? Yes. Pt states that the pharmacy told her to call the practice because it was no more refills on medicine   (Agent: If no, request that the patient contact the pharmacy for the refill.)   Preferred Pharmacy (with phone number or street name): [facility], [place]. [address] [phone] (Phone) [phone] (Fax)     Agent: Please be advised that RX refills may take up to 3 business days. We ask that you follow-up with your pharmacy.

## 2017-05-04 NOTE — Telephone Encounter (Signed)
Rx refill request for provider review: Call to patient- she states Dr Ernie Avena name is on her bottle  Bupropion HCL - historical medication  LOV: 12/16/16  Pharmacy: verified

## 2017-05-05 ENCOUNTER — Encounter: Payer: Medicare Other | Admitting: Physical Medicine & Rehabilitation

## 2017-05-05 ENCOUNTER — Telehealth: Payer: Self-pay

## 2017-05-05 ENCOUNTER — Other Ambulatory Visit: Payer: Self-pay | Admitting: Medical

## 2017-05-05 NOTE — Telephone Encounter (Signed)
Pt hasn't had labs done here in over a year. Think this ay have been sent to wrong office.  Copied from [place]. Topic: Appointment Scheduling - Scheduling Inquiry for Clinic >> May 05, 2017 11:34 AM Ether Griffins B wrote: Reason for CRM: pt requesting lab work done from her physical that was done a couple months ago

## 2017-05-05 NOTE — Telephone Encounter (Signed)
Pt due for a follow up please notify pt that PCP will not refill bupropion until she is seen.

## 2017-05-05 NOTE — Telephone Encounter (Signed)
Called pt and scheduled appt for next Wed 05/11/17 @1pm .

## 2017-05-06 ENCOUNTER — Other Ambulatory Visit: Payer: Self-pay | Admitting: Medical

## 2017-05-06 ENCOUNTER — Other Ambulatory Visit: Payer: Self-pay | Admitting: Physical Medicine & Rehabilitation

## 2017-05-06 DIAGNOSIS — I10 Essential (primary) hypertension: Secondary | ICD-10-CM

## 2017-05-11 ENCOUNTER — Ambulatory Visit: Payer: Self-pay | Admitting: Medical

## 2017-05-12 NOTE — Telephone Encounter (Signed)
Offered pt an appointment Pt states she just want labs done PCP told her she needs labs done. Please advise.

## 2017-05-12 NOTE — Telephone Encounter (Signed)
Patient calling back, is requesting Mackie Pai to order labs. Please advise

## 2017-05-13 NOTE — Telephone Encounter (Signed)
It looks like patient had appointment with gynecologist in January.  May be she is confused and thought that was with me?  I have not seen her since April of last year.  You might review office notes and confirm that.  That is the case she really does need to come in.  It is rare that I see someone her age with her medical problems and sees him only one time a year.  Or just have them come in for labs only and not be seen.  Please advise her regarding that and let me know what she says.

## 2017-05-17 ENCOUNTER — Telehealth: Payer: Self-pay | Admitting: Medical

## 2017-05-17 ENCOUNTER — Other Ambulatory Visit: Payer: Self-pay | Admitting: Medical

## 2017-05-17 NOTE — Telephone Encounter (Signed)
Copied from [place] [phone]. Topic: Quick Communication - See Telephone Encounter >> May 17, 2017 11:21 AM Ahmed Prima L wrote: CRM for notification. See Telephone encounter for:   05/17/17.  Don from [facility] called & stated that "Olmesartan" is on back order. Please advise.   She needs something in place of it. [facility] - [place], [place] [address] [place]. [address]  They have Losartan available and its not re-called. Call back @ [phone]

## 2017-05-17 NOTE — Telephone Encounter (Signed)
Copied from [place]. Topic: Quick Communication - Rx Refill/Question >> May 17, 2017 11:25 AM Oliver Pila B wrote: Medication:  levothyroxine (SYNTHROID, LEVOTHROID) 112 MCG tablet [[id number]]    Has the patient contacted their pharmacy? Yes.     (Agent: If no, request that the patient contact the pharmacy for the refill.)   Preferred Pharm[facility] (with phone number or street name): [facility]   Agent: Please be advised that RX refills may take up to 3 business days. We ask that you follow-up with your pharmacy.

## 2017-05-18 NOTE — Telephone Encounter (Signed)
TSH lab draw due. Attempted to leave VM; not set up.

## 2017-05-18 NOTE — Telephone Encounter (Signed)
Tired to reach pt. Pt has no voicemail

## 2017-05-20 MED ORDER — LOSARTAN POTASSIUM 100 MG PO TABS: 100.0000 mg | ORAL_TABLET | Freq: Every day | ORAL | 0 refills | Status: DC

## 2017-05-20 NOTE — Telephone Encounter (Signed)
Refilled her losartan at 100 mg a day #30.  No refills.  Please advise her she needs to come in for blood pressure check since making change of medication.  Also she needs a follow-up anyway since I think is been about 8 months or more since I last saw her.

## 2017-05-23 NOTE — Telephone Encounter (Signed)
Pt due for follow up please call and schedule appointment.  

## 2017-05-24 NOTE — Telephone Encounter (Signed)
Patient has a appointment 06/03/2017.

## 2017-05-24 NOTE — Telephone Encounter (Signed)
Sent in rx refill of her thyroid med.

## 2017-06-03 ENCOUNTER — Encounter: Payer: Self-pay | Admitting: Physical Medicine & Rehabilitation

## 2017-06-03 ENCOUNTER — Encounter: Payer: Medicare Other | Attending: Physical Medicine & Rehabilitation | Admitting: Physical Medicine & Rehabilitation

## 2017-06-03 VITALS — BP 110/55 | HR 71

## 2017-06-03 DIAGNOSIS — Z8711 Personal history of peptic ulcer disease: Secondary | ICD-10-CM | POA: Diagnosis not present

## 2017-06-03 DIAGNOSIS — M7062 Trochanteric bursitis, left hip: Secondary | ICD-10-CM

## 2017-06-03 DIAGNOSIS — F419 Anxiety disorder, unspecified: Secondary | ICD-10-CM | POA: Diagnosis not present

## 2017-06-03 DIAGNOSIS — I10 Essential (primary) hypertension: Secondary | ICD-10-CM | POA: Diagnosis not present

## 2017-06-03 DIAGNOSIS — E039 Hypothyroidism, unspecified: Secondary | ICD-10-CM | POA: Diagnosis not present

## 2017-06-03 DIAGNOSIS — G8929 Other chronic pain: Secondary | ICD-10-CM | POA: Diagnosis not present

## 2017-06-03 DIAGNOSIS — Z85828 Personal history of other malignant neoplasm of skin: Secondary | ICD-10-CM | POA: Diagnosis not present

## 2017-06-03 DIAGNOSIS — M19012 Primary osteoarthritis, left shoulder: Secondary | ICD-10-CM | POA: Insufficient documentation

## 2017-06-03 DIAGNOSIS — Z981 Arthrodesis status: Secondary | ICD-10-CM | POA: Diagnosis not present

## 2017-06-03 DIAGNOSIS — F319 Bipolar disorder, unspecified: Secondary | ICD-10-CM | POA: Diagnosis not present

## 2017-06-03 DIAGNOSIS — Z87891 Personal history of nicotine dependence: Secondary | ICD-10-CM | POA: Insufficient documentation

## 2017-06-03 DIAGNOSIS — G609 Hereditary and idiopathic neuropathy, unspecified: Secondary | ICD-10-CM

## 2017-06-03 DIAGNOSIS — F988 Other specified behavioral and emotional disorders with onset usually occurring in childhood and adolescence: Secondary | ICD-10-CM | POA: Diagnosis not present

## 2017-06-03 DIAGNOSIS — J42 Unspecified chronic bronchitis: Secondary | ICD-10-CM | POA: Diagnosis not present

## 2017-06-03 DIAGNOSIS — K219 Gastro-esophageal reflux disease without esophagitis: Secondary | ICD-10-CM | POA: Diagnosis not present

## 2017-06-03 DIAGNOSIS — R269 Unspecified abnormalities of gait and mobility: Secondary | ICD-10-CM

## 2017-06-03 DIAGNOSIS — G458 Other transient cerebral ischemic attacks and related syndromes: Secondary | ICD-10-CM | POA: Diagnosis not present

## 2017-06-03 DIAGNOSIS — Z96642 Presence of left artificial hip joint: Secondary | ICD-10-CM | POA: Insufficient documentation

## 2017-06-03 DIAGNOSIS — M25552 Pain in left hip: Secondary | ICD-10-CM | POA: Insufficient documentation

## 2017-06-03 MED ORDER — GABAPENTIN 300 MG PO CAPS: 900.0000 mg | ORAL_CAPSULE | Freq: Three times a day (TID) | ORAL | 1 refills | Status: DC

## 2017-06-03 NOTE — Progress Notes (Signed)
Subjective:    Patient ID: Rose [name], female    DOB: 03-Jul-1940, 77 y.o.   MRN: [id number]  HPI 77 y/o female with pmh/psh of idiopathic neuropathy, left shoulder OA, ADD, depression, bipolar disorder, subclavian steal syndrome, lumbar fusion, left THA presents for follow up for chronic left hip pain. Started after hip replacement ~2005. Stable.  Rest improves.  Ambulation exacerbates the pain.  Sharp.  Non-radiating.  She was told it was nerve damage from the hip replacement.  Denies associated numbness.  Pt had hip injection in 07/2016 without benefit. ~6 fall in last year stumbling. Pain limits ambulation.   Last clinic visit 11/19/16.  At that time, she had a left greater trochanteric bursa injection.  She states she did not receive any benefit from the injection.  No benefit with Gabapentin.  No benefit with Voltaren gel or Robaxin. Has not tried heat/cold.  She was told she has a calcified nerve per [facility].  She never obtained TENS.  Thinks she fell once.   Pain Inventory Average Pain 9 Pain Right Now 0, only hurts with walking My pain is intermittent and stabbing  In the last 24 hours, has pain interfered with the following? General activity 2 Relation with others 0 Enjoyment of life 5 What TIME of day is your pain at its worst? varies with activity Sleep (in general) Good  Pain is worse with: walking and standing Pain improves with: rest and pacing activities Relief from Meds: na  Mobility walk without assistance use a cane how many minutes can you walk? 2-5 ability to climb steps?  yes do you drive?  no transfers alone  Function retired Do you have any goals in this area?  no  Neuro/Psych bladder control problems trouble walking  Prior Studies new visit  Physicians involved in your care new visit   Family History  Adopted: Yes  Problem Relation Age of Onset  . Schizophrenia Son        Deceased 64  . Hypertension Daughter        Renal  .  Healthy Son        x1  . Healthy Daughter        x2   Social History   Socioeconomic History  . Marital status: Single    Spouse name: Not on file  . Number of children: Y  . Years of education: Not on file  . Highest education level: Not on file  Occupational History  . Occupation: retired    Fish farm manager: RETIRED    CommentClinical cytogeneticist, had own firm  Social Needs  . Financial resource strain: Not on file  . Food insecurity:    Worry: Not on file    Inability: Not on file  . Transportation needs:    Medical: Not on file    Non-medical: Not on file  Tobacco Use  . Smoking status: Former Smoker    Packs/day: 1.00    Years: 25.00    Pack years: 25.00    Types: Cigarettes    Last attempt to quit: 03/16/1983    Years since quitting: 34.2  . Smokeless tobacco: Never Used  Substance and Sexual Activity  . Alcohol use: Yes    Comment: 2-3 glasses wine daily  . Drug use: No  . Sexual activity: Never    Comment: 1st intercourse 77 yo-Fewer than 5 partners  Lifestyle  . Physical activity:    Days per week: Not on file    Minutes per  session: Not on file  . Stress: Not on file  Relationships  . Social connections:    Talks on phone: Not on file    Gets together: Not on file    Attends religious service: Not on file    Active member of club or organization: Not on file    Attends meetings of clubs or organizations: Not on file    Relationship status: Not on file  Other Topics Concern  . 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                  Past Surgical History:  Procedure Laterality Date  . APPENDECTOMY    . CARPOMETACARPEL SUSPENSION PLASTY Right 08/07/2013   Procedure: CARPOMETACARPEL [facility]) SUSPENSION PLASTY  RIGHT THUMB;  Surgeon: Wynonia Sours, MD;  Location: [place];  Service: Orthopedics;  Laterality: Right;  . CARPOMETACARPEL SUSPENSION PLASTY Left 10/23/2015   Procedure: SUSPENSION PLASTY LEFT THUMB TRAPEZIUM EXCISION ABDUCTOR POLLICIS LONGUS TRANSFER;  Surgeon: Daryll Brod, MD;  Location: [place];  Service: Orthopedics;  Laterality: Left;  . CERVICAL FUSION  2007   Dr. Valli Glance approach  . JOINT REPLACEMENT Left   . Left brachial artery repair of pseudoaneurysm  2001   post a cath  . LUMBAR FUSION  09/2004   T12-L5 (Dr. Patrice Paradise)  . MINOR IRRIGATION AND DEBRIDEMENT OF WOUND Right 08/27/2014   Procedure: MINOR IRRIGATION AND DEBRIDEMENT OF WOUND;  Surgeon: Daryll Brod, MD;  Location: [place];  Service: Orthopedics;  Laterality: Right;  . ORIF TIBIA FRACTURE  2010   Left distal  . Percutanous Transluminal Angioplasty of Renal Arteris    . REPAIR EXTENSOR TENDON Right 09/26/2014   Procedure: REPAIR EXTENSOR TENDON RIGHT RING FINGER ;  Surgeon: Daryll Brod, MD;  Location: [place];  Service: Orthopedics;  Laterality: Right;  . SHOULDER ARTHROSCOPY  09/2008   Left  . TONSILLECTOMY AND ADENOIDECTOMY    . TOTAL HIP ARTHROPLASTY  2008   left  . WISDOM TOOTH EXTRACTION     Past Medical History:  Diagnosis Date  . Abnormal blood pressure    left arm from old brachial artery repair  . Anemia   . Anxiety   . Atherosclerosis of renal artery ([place])   . Attention deficit disorder without mention of hyperactivity   . Basal cell carcinoma of face   . Benign heart murmur   . Carotid artery disease ([place])   . Carotid bruit    Left  . Centrilobular emphysema ([place])   . Chronic bronchitis ([place])   . Depression   . DJD (degenerative joint disease) of hip   . Esophageal reflux   . History of depression   . History of spinal fusion   . Hypertension   . Morton's neuroma    Hx of, Left  . Myalgia and myositis, unspecified   . Other  tenosynovitis of hand and wrist   . Peripheral neuropathy   . Personal history of alcoholism ([place])   . Personal history of peptic ulcer disease   . Pulmonary nodule   . Rotator cuff disorder    pain  .  Solitary cyst of breast   . Subclavian steal syndrome   . Unspecified hypothyroidism   . Urinary incontinence   . Wears glasses    BP (!) 110/55   Pulse 71   SpO2 97%   Opioid Risk Score:   Fall Risk Score:  `1  Depression screen PHQ 2/9  Depression screen [facility] 2/9 09/29/2016 06/10/2016 04/20/2015  Decreased Interest 0 0 0  Down, Depressed, Hopeless 1 0 0  PHQ - 2 Score 1 0 0  Altered sleeping 0 - -  Tired, decreased energy 0 - -  Change in appetite 0 - -  Feeling bad or failure about yourself  1 - -  Trouble concentrating 1 - -  Moving slowly or fidgety/restless 0 - -  Suicidal thoughts 0 - -  PHQ-9 Score 3 - -  Difficult doing work/chores Not difficult at all - -  Some recent data might be hidden    Review of Systems  Constitutional: Negative.   HENT: Negative.   Eyes: Negative.   Respiratory: Negative.   Cardiovascular: Negative.   Gastrointestinal: Negative.   Endocrine: Negative.   Genitourinary: Negative.   Musculoskeletal: Positive for arthralgias, back pain, gait problem and myalgias.  Skin: Negative.   Allergic/Immunologic: Negative.   Hematological: Negative.   Psychiatric/Behavioral: Negative.   All other systems reviewed and are negative.      Objective:   Physical Exam Gen: NAD. Vital signs reviewed HENT: Normocephalic, Atraumatic Eyes: EOMI. No discharge.  Cardio: RRR. No JVD. Pulm: B/l clear to auscultation.  Effort normal. Abd: Soft, BS+ MSK:  Gait wide based.   Mild TTP left greater trochanter.   Neuro:   Strength  4+/5 in all LE myotomes Skin: Warm and Dry. Intact    Assessment & Plan:  77 y/o female with pmh/psh of idiopathic neuropathy, left shoulder OA, ADD, depression, bipolar disorder, subclavian steal syndrome, lumbar fusion,  left THA presents for follow up for chronic left hip pain.   1. Chronic left hip pain  CT reviewed from 5/18 showing postoperative THA changes  MRI L-spine reviewed, mild changes  No benefit with Voltaren gel, Robaxin, heat/cold  Labs reviewed  Will consider TENS IT after PT eval  Encouraged taking Gabapentin 900 TID  Trial OTC Lidoderm patch  Will order PT, preference for aquatic therapy for pain, ROM, stretching, leg length eval, TENS eval  Will consider Mobic, but will try to avoid due to renal function   2. Gait abnormality  Cont cane for safety  3. Left greater trochanteric bursitis  No benefit with steroid injection  4. Idiopathic Neuropathy  Gabapentin ordered

## 2017-06-16 DIAGNOSIS — M6281 Muscle weakness (generalized): Secondary | ICD-10-CM | POA: Diagnosis not present

## 2017-06-16 DIAGNOSIS — M25552 Pain in left hip: Secondary | ICD-10-CM | POA: Diagnosis not present

## 2017-06-16 DIAGNOSIS — R262 Difficulty in walking, not elsewhere classified: Secondary | ICD-10-CM | POA: Diagnosis not present

## 2017-06-16 DIAGNOSIS — R2689 Other abnormalities of gait and mobility: Secondary | ICD-10-CM | POA: Diagnosis not present

## 2017-06-19 ENCOUNTER — Other Ambulatory Visit: Payer: Self-pay | Admitting: Medical

## 2017-06-21 ENCOUNTER — Telehealth: Payer: Self-pay | Admitting: Medical

## 2017-06-21 NOTE — Telephone Encounter (Signed)
Copied from [place] [id number]. Topic: Quick Communication - Rx Refill/Question >> Jun 21, 2017 12:23 PM Oliver Pila B wrote: Medication: cloNIDine (CATAPRES) 0.1 MG tablet [[id number]]  DISCONTINUED  Has the patient contacted their pharmacy? Yes.   (Agent: If no, request that the patient contact the pharmacy for the refill.) Preferred Pharmacy (with phone number or street name): [facility] Agent: Please be advised that RX refills may take up to 3 business days. We ask that you follow-up with your pharmacy. Pt states she needs an early refill and she is needing 15 of them, call pt to advise

## 2017-06-21 NOTE — Telephone Encounter (Signed)
Patient called and informed that she will need an office visit before medications can be filled, also advised Clonidine is no longer on her medication profile. She says "it is there, I've been on it for years. Go ahead and schedule an appointment." Appointment scheduled for tomorrow at 1500 with Mackie Pai, [place].

## 2017-06-22 ENCOUNTER — Encounter: Payer: Self-pay | Admitting: Medical

## 2017-06-22 ENCOUNTER — Ambulatory Visit (INDEPENDENT_AMBULATORY_CARE_PROVIDER_SITE_OTHER): Payer: Medicare Other | Admitting: Medical

## 2017-06-22 VITALS — BP 120/65 | HR 80 | Temp 98.2°F | Resp 16 | Ht 62.0 in | Wt 136.4 lb

## 2017-06-22 DIAGNOSIS — R739 Hyperglycemia, unspecified: Secondary | ICD-10-CM

## 2017-06-22 DIAGNOSIS — R2689 Other abnormalities of gait and mobility: Secondary | ICD-10-CM | POA: Diagnosis not present

## 2017-06-22 DIAGNOSIS — M25552 Pain in left hip: Secondary | ICD-10-CM | POA: Diagnosis not present

## 2017-06-22 DIAGNOSIS — I10 Essential (primary) hypertension: Secondary | ICD-10-CM | POA: Diagnosis not present

## 2017-06-22 DIAGNOSIS — R262 Difficulty in walking, not elsewhere classified: Secondary | ICD-10-CM | POA: Diagnosis not present

## 2017-06-22 DIAGNOSIS — M6281 Muscle weakness (generalized): Secondary | ICD-10-CM | POA: Diagnosis not present

## 2017-06-22 MED ORDER — CLONIDINE HCL 0.1 MG PO TABS: 0.1000 mg | ORAL_TABLET | Freq: Three times a day (TID) | ORAL | 1 refills | Status: DC

## 2017-06-22 NOTE — Progress Notes (Signed)
Subjective:    Patient ID: Rose [name], female    DOB: 06-20-1940, 77 y.o.   MRN: [id number]  HPI  Pt in states she needs refill of clonidine. She has been on this for her blood pressure. She states will be out of medication next week and friend who picks up med won't be in town next week. Pt has amlodipine rx. Also has adequate losartan.   She has been on clonidine for years and no adverse side effects.  Pt bp slightly high today initially today. Pt states at home have been 120/60 consistently. Checks one time a week.   Pt has had some hip area pain. Pt just started aquatic therapy and she feels like it will help her hip.  Patient declined PSV 13 vaccine today.   Review of Systems  Constitutional: Negative for chills, fatigue and fever.  Respiratory: Negative for cough, chest tightness, shortness of breath and wheezing.   Cardiovascular: Negative for chest pain and palpitations.  Gastrointestinal: Negative for abdominal pain.  Musculoskeletal:       Hip pain improved and seeing specialist.  Skin: Negative for rash.  Neurological: Negative for dizziness, speech difficulty, weakness, numbness and headaches.  Hematological: Negative for adenopathy. Does not bruise/bleed easily.  Psychiatric/Behavioral: Negative for behavioral problems, dysphoric mood and sleep disturbance. The patient is not hyperactive.     Past Medical History:  Diagnosis Date  . Abnormal blood pressure    left arm from old brachial artery repair  . Anemia   . Anxiety   . Atherosclerosis of renal artery ([place])   . Attention deficit disorder without mention of hyperactivity   . Basal cell carcinoma of face   . Benign heart murmur   . Carotid artery disease ([place])   . Carotid bruit    Left  . Centrilobular emphysema ([place])   . Chronic bronchitis ([place])   . Depression   . DJD (degenerative joint disease) of hip   . Esophageal reflux   . History of depression   . History of spinal fusion   .  Hypertension   . Morton's neuroma    Hx of, Left  . Myalgia and myositis, unspecified   . Other tenosynovitis of hand and wrist   . Peripheral neuropathy   . Personal history of alcoholism ([place])   . Personal history of peptic ulcer disease   . Pulmonary nodule   . Rotator cuff disorder    pain  . Solitary cyst of breast   . Subclavian steal syndrome   . Unspecified hypothyroidism   . Urinary incontinence   . Wears glasses      Social History   Socioeconomic History  . Marital status: Single    Spouse name: Not on file  . Number of children: Y  . Years of education: Not on file  . Highest education level: Not on file  Occupational History  . Occupation: retired    Fish farm manager: RETIRED    CommentClinical cytogeneticist, had own firm  Social Needs  . Financial resource strain: Not on file  . Food insecurity:    Worry: Not on file    Inability: Not on file  . Transportation needs:    Medical: Not on file    Non-medical: Not on file  Tobacco Use  . Smoking status: Former Smoker    Packs/day: 1.00    Years: 25.00    Pack years: 25.00    Types: Cigarettes    Last attempt to quit: 03/16/1983  Years since quitting: 34.2  . Smokeless tobacco: Never Used  Substance and Sexual Activity  . Alcohol use: Yes    Comment: 2-3 glasses wine daily  . Drug use: No  . Sexual activity: Never    Comment: 1st intercourse 77 yo-Fewer than 5 partners  Lifestyle  . Physical activity:    Days per week: Not on file    Minutes per session: Not on file  . Stress: Not on file  Relationships  . Social connections:    Talks on phone: Not on file    Gets together: Not on file    Attends religious service: Not on file    Active member of club or organization: Not on file    Attends meetings of clubs or organizations: Not on file    Relationship status: Not on file  . Intimate partner violence:    Fear of current or ex partner: Not on file    Emotionally abused: Not on file    Physically abused: Not on  file    Forced sexual activity: Not on file  Other Topics Concern  . 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                   Past Surgical History:  Procedure Laterality Date  . APPENDECTOMY    . CARPOMETACARPEL SUSPENSION PLASTY Right 08/07/2013   Procedure: CARPOMETACARPEL [facility]) SUSPENSION PLASTY RIGHT THUMB;  Surgeon: Wynonia Sours, MD;  Location: [place];  Service: Orthopedics;  Laterality: Right;  . CARPOMETACARPEL SUSPENSION PLASTY Left 10/23/2015   Procedure: SUSPENSION PLASTY LEFT THUMB TRAPEZIUM EXCISION ABDUCTOR POLLICIS LONGUS TRANSFER;  Surgeon: Daryll Brod, MD;  Location: [place];  Service: Orthopedics;  Laterality: Left;  . CERVICAL FUSION  2007   Dr. Valli Glance approach  . JOINT REPLACEMENT Left   . Left brachial artery repair of pseudoaneurysm  2001   post a cath  . LUMBAR FUSION  09/2004   T12-L5 (Dr. Patrice Paradise)  . MINOR IRRIGATION AND DEBRIDEMENT OF WOUND Right 08/27/2014   Procedure: MINOR IRRIGATION AND DEBRIDEMENT OF WOUND;  Surgeon: Daryll Brod, MD;  Location: [place];  Service: Orthopedics;  Laterality: Right;  . ORIF TIBIA FRACTURE  2010   Left distal  . Percutanous Transluminal Angioplasty of Renal Arteris    . REPAIR EXTENSOR TENDON Right 09/26/2014   Procedure: REPAIR EXTENSOR TENDON RIGHT RING FINGER ;  Surgeon: Daryll Brod, MD;  Location: [place];  Service: Orthopedics;  Laterality: Right;  . SHOULDER ARTHROSCOPY  09/2008   Left  . TONSILLECTOMY AND ADENOIDECTOMY    . TOTAL HIP ARTHROPLASTY  2008   left  . WISDOM TOOTH EXTRACTION      Family History  Adopted: Yes  Problem Relation Age of Onset  . Schizophrenia Son         Deceased 47  . Hypertension Daughter        Renal  . Healthy Son        x1  . Healthy Daughter        x2    Allergies  Allergen Reactions  . Amoxicillin  REACTION: Rash  . Codeine Hives  . Hydrocodone-Acetaminophen     REACTION: itching  . Hydromorphone Hcl   . Morphine And Related Hives and Itching  . Morphine Sulfate     REACTION: unspecified  . Nsaids     Ulcer    Current Outpatient Medications on File Prior to Visit  Medication Sig Dispense Refill  . alendronate (FOSAMAX) 70 MG tablet Take 1 tablet (70 mg total) by mouth every 7 (seven) days. Take with a full glass of water on an empty stomach. 4 tablet 11  . amLODipine (NORVASC) 5 MG tablet TAKE ONE TABLET BY MOUTH TWICE A DAY 180 tablet 0  . aspirin 81 MG tablet Take 81 mg by mouth daily.     Marland Kitchen atomoxetine (STRATTERA) 100 MG capsule Take 100 mg by mouth daily.    . BuPROPion HCl (WELLBUTRIN PO) Take by mouth.    . DULoxetine (CYMBALTA) 60 MG capsule TAKE ONE CAPSULE BY MOUTH DAILY 30 capsule 5  . ferrous sulfate dried (SLOW FE) 160 (50 FE) MG TBCR Take 160 mg by mouth 2 (two) times daily. Patient has been taking Once Daily    . gabapentin (NEURONTIN) 300 MG capsule Take 3 capsules (900 mg total) by mouth 3 (three) times daily. 270 capsule 1  . Glucosamine 500 MG TABS Take 1 tablet by mouth 2 (two) times daily. Glucosamine-Chondronton    . levothyroxine (SYNTHROID, LEVOTHROID) 112 MCG tablet TAKE ONE TABLET BY MOUTH DAILY BEFORE BREAKFAST 90 tablet 1  . losartan (COZAAR) 100 MG tablet Take 1 tablet (100 mg total) by mouth daily. Pt needs to come in before she runs out for check/refills. Need to check bp 30 tablet 0  . OLANZapine (ZYPREXA) 5 MG tablet Take 15 mg by mouth at bedtime.     Marland Kitchen oxybutynin (DITROPAN-XL) 10 MG 24 hr tablet Take 1 tablet (10 mg total) by mouth at bedtime. 30 tablet 11   No current facility-administered medications on file prior to visit.     There were no vitals taken for this  visit.      Objective:   Physical Exam   General Mental Status- Alert. General Appearance- Not in acute distress.   Skin General: Color- Normal Color. Moisture- Normal Moisture.  Neck Carotid Arteries- Normal color. Moisture- Normal Moisture. No carotid bruits. No JVD.  Chest and Lung Exam Auscultation: Breath Sounds:-Normal.  Cardiovascular Auscultation:Rythm- Regular. Murmurs & Other Heart Sounds:Auscultation of the heart reveals- No Murmurs.   Neurologic Cranial Nerve exam:- CN III-XII intact(No nystagmus), symmetric smile. Strength:- 5/5 equal and symmetric strength both upper and lower extremities.    Assessment & Plan:  Your blood pressure is well controlled today.  Continue current BP medication regimen and refilling her clonidine.  Please get metabolic panel and D9I today.  Sugars have been mildly elevated in the past and we will see what your 16-month blood sugar average shows.  Follow-up appointment date depending on lab review.  Estimation will be 3-6 months.  Sooner if needed.  Mackie Pai, PA-C

## 2017-06-22 NOTE — Patient Instructions (Signed)
Your blood pressure is well controlled today.  Continue current BP medication regimen and refilling her clonidine.  Please get metabolic panel and U9W today.  Sugars have been mildly elevated in the past and we will see what your 66-month blood sugar average shows.  Follow-up appointment date depending on lab review.  Estimation will be 3-6 months.  Sooner if needed.

## 2017-06-23 LAB — COMPREHENSIVE METABOLIC PANEL
ALT: 18 U/L (ref 0–35)
AST: 18 U/L (ref 0–37)
Albumin: 4.1 g/dL (ref 3.5–5.2)
Alkaline Phosphatase: 74 U/L (ref 39–117)
BUN: 32 mg/dL — ABNORMAL HIGH (ref 6–23)
CALCIUM: 10.2 mg/dL (ref 8.4–10.5)
CHLORIDE: 106 meq/L (ref 96–112)
CO2: 28 meq/L (ref 19–32)
Creatinine, Ser: 1.05 mg/dL (ref 0.40–1.20)
GFR: 53.78 mL/min — AB (ref >60.00)
Glucose, Bld: 98 mg/dL (ref 70–99)
POTASSIUM: 5.2 meq/L — AB (ref 3.5–5.1)
Sodium: 140 mEq/L (ref 135–145)
Total Bilirubin: 0.3 mg/dL (ref 0.2–1.2)
Total Protein: 7.2 g/dL (ref 6.0–8.3)

## 2017-06-23 LAB — HEMOGLOBIN A1C: Hgb A1c MFr Bld: 5.9 % (ref 4.6–6.5)

## 2017-06-30 ENCOUNTER — Ambulatory Visit: Payer: Self-pay | Admitting: Physical Medicine & Rehabilitation

## 2017-07-04 ENCOUNTER — Other Ambulatory Visit: Payer: Self-pay | Admitting: Medical

## 2017-07-04 DIAGNOSIS — M25552 Pain in left hip: Secondary | ICD-10-CM | POA: Diagnosis not present

## 2017-07-04 DIAGNOSIS — I10 Essential (primary) hypertension: Secondary | ICD-10-CM

## 2017-07-04 DIAGNOSIS — M6281 Muscle weakness (generalized): Secondary | ICD-10-CM | POA: Diagnosis not present

## 2017-07-04 DIAGNOSIS — R2689 Other abnormalities of gait and mobility: Secondary | ICD-10-CM | POA: Diagnosis not present

## 2017-07-04 DIAGNOSIS — R262 Difficulty in walking, not elsewhere classified: Secondary | ICD-10-CM | POA: Diagnosis not present

## 2017-07-06 DIAGNOSIS — M25552 Pain in left hip: Secondary | ICD-10-CM | POA: Diagnosis not present

## 2017-07-06 DIAGNOSIS — M6281 Muscle weakness (generalized): Secondary | ICD-10-CM | POA: Diagnosis not present

## 2017-07-06 DIAGNOSIS — R262 Difficulty in walking, not elsewhere classified: Secondary | ICD-10-CM | POA: Diagnosis not present

## 2017-07-06 DIAGNOSIS — R2689 Other abnormalities of gait and mobility: Secondary | ICD-10-CM | POA: Diagnosis not present

## 2017-07-07 ENCOUNTER — Encounter: Payer: Medicare Other | Admitting: Physical Medicine & Rehabilitation

## 2017-07-08 ENCOUNTER — Telehealth: Payer: Self-pay | Admitting: *Deleted

## 2017-07-08 NOTE — Telephone Encounter (Signed)
Patient said she looked online at TENS units and discovered there is a lot of choices.  She is asking if this something she should get from [place] or physical therapy.  Please advise

## 2017-07-08 NOTE — Telephone Encounter (Signed)
She was referred to PT, she can check with them.  Thanks.

## 2017-07-11 DIAGNOSIS — R262 Difficulty in walking, not elsewhere classified: Secondary | ICD-10-CM | POA: Diagnosis not present

## 2017-07-11 DIAGNOSIS — M25552 Pain in left hip: Secondary | ICD-10-CM | POA: Diagnosis not present

## 2017-07-11 DIAGNOSIS — R2689 Other abnormalities of gait and mobility: Secondary | ICD-10-CM | POA: Diagnosis not present

## 2017-07-11 DIAGNOSIS — M6281 Muscle weakness (generalized): Secondary | ICD-10-CM | POA: Diagnosis not present

## 2017-07-12 NOTE — Telephone Encounter (Signed)
Called patient and relayed message.

## 2017-07-13 DIAGNOSIS — M6281 Muscle weakness (generalized): Secondary | ICD-10-CM | POA: Diagnosis not present

## 2017-07-13 DIAGNOSIS — M25552 Pain in left hip: Secondary | ICD-10-CM | POA: Diagnosis not present

## 2017-07-13 DIAGNOSIS — R2689 Other abnormalities of gait and mobility: Secondary | ICD-10-CM | POA: Diagnosis not present

## 2017-07-13 DIAGNOSIS — R262 Difficulty in walking, not elsewhere classified: Secondary | ICD-10-CM | POA: Diagnosis not present

## 2017-07-14 ENCOUNTER — Encounter: Payer: Self-pay | Admitting: Physical Medicine & Rehabilitation

## 2017-07-14 ENCOUNTER — Encounter: Payer: Medicare Other | Attending: Physical Medicine & Rehabilitation | Admitting: Physical Medicine & Rehabilitation

## 2017-07-14 VITALS — BP 141/77 | HR 107 | Ht 63.0 in | Wt 134.0 lb

## 2017-07-14 DIAGNOSIS — F319 Bipolar disorder, unspecified: Secondary | ICD-10-CM | POA: Diagnosis not present

## 2017-07-14 DIAGNOSIS — K219 Gastro-esophageal reflux disease without esophagitis: Secondary | ICD-10-CM | POA: Insufficient documentation

## 2017-07-14 DIAGNOSIS — I10 Essential (primary) hypertension: Secondary | ICD-10-CM | POA: Diagnosis not present

## 2017-07-14 DIAGNOSIS — J42 Unspecified chronic bronchitis: Secondary | ICD-10-CM | POA: Diagnosis not present

## 2017-07-14 DIAGNOSIS — Z8711 Personal history of peptic ulcer disease: Secondary | ICD-10-CM | POA: Diagnosis not present

## 2017-07-14 DIAGNOSIS — R269 Unspecified abnormalities of gait and mobility: Secondary | ICD-10-CM | POA: Insufficient documentation

## 2017-07-14 DIAGNOSIS — E039 Hypothyroidism, unspecified: Secondary | ICD-10-CM | POA: Diagnosis not present

## 2017-07-14 DIAGNOSIS — M7062 Trochanteric bursitis, left hip: Secondary | ICD-10-CM | POA: Insufficient documentation

## 2017-07-14 DIAGNOSIS — M25552 Pain in left hip: Secondary | ICD-10-CM | POA: Insufficient documentation

## 2017-07-14 DIAGNOSIS — G609 Hereditary and idiopathic neuropathy, unspecified: Secondary | ICD-10-CM | POA: Insufficient documentation

## 2017-07-14 DIAGNOSIS — G8929 Other chronic pain: Secondary | ICD-10-CM | POA: Insufficient documentation

## 2017-07-14 DIAGNOSIS — Z981 Arthrodesis status: Secondary | ICD-10-CM | POA: Diagnosis not present

## 2017-07-14 DIAGNOSIS — M19012 Primary osteoarthritis, left shoulder: Secondary | ICD-10-CM | POA: Diagnosis not present

## 2017-07-14 DIAGNOSIS — G458 Other transient cerebral ischemic attacks and related syndromes: Secondary | ICD-10-CM | POA: Diagnosis not present

## 2017-07-14 DIAGNOSIS — F419 Anxiety disorder, unspecified: Secondary | ICD-10-CM | POA: Diagnosis not present

## 2017-07-14 DIAGNOSIS — Z96642 Presence of left artificial hip joint: Secondary | ICD-10-CM | POA: Insufficient documentation

## 2017-07-14 DIAGNOSIS — Z87891 Personal history of nicotine dependence: Secondary | ICD-10-CM | POA: Diagnosis not present

## 2017-07-14 DIAGNOSIS — Z85828 Personal history of other malignant neoplasm of skin: Secondary | ICD-10-CM | POA: Diagnosis not present

## 2017-07-14 DIAGNOSIS — F988 Other specified behavioral and emotional disorders with onset usually occurring in childhood and adolescence: Secondary | ICD-10-CM | POA: Diagnosis not present

## 2017-07-14 NOTE — Progress Notes (Signed)
Subjective:    Patient ID: Rose [name], female    DOB: [birth year], 77 y.o.   MRN: [id number]  HPI 77 y/o female with pmh/psh of idiopathic neuropathy, left shoulder OA, ADD, depression, bipolar disorder, subclavian steal syndrome, lumbar fusion, left THA presents for follow up for chronic left hip pain. Started after hip replacement ~2005. Stable.  Rest improves.  Ambulation exacerbates the pain.  Sharp.  Non-radiating.  She was told it was nerve damage from the hip replacement.  Denies associated numbness.  Pt had hip injection in 07/2016 without benefit. ~6 fall in last year stumbling. Pain limits ambulation.   Last clinic visit 06/03/17.  Since that time, pt notes benefit, likely from pool therapy. Encouraged trial of TENS with PT again.  She states she does not need the lidoderm patch.  Denies falls. Not using cane.  She is not sure if the Gabapentin helps.  Pain Inventory Average Pain 9 Pain Right Now 0, only hurts with walking My pain is intermittent and stabbing  In the last 24 hours, has pain interfered with the following? General activity 1 Relation with others 1 Enjoyment of life 1 What TIME of day is your pain at its worst? varies with activity Sleep (in general) Good  Pain is worse with: walking Pain improves with: rest and pacing activities Relief from Meds: na  Mobility walk without assistance use a cane how many minutes can you walk? 2-5 ability to climb steps?  yes do you drive?  no transfers alone  Function retired Do you have any goals in this area?  no  Neuro/Psych bladder control problems trouble walking  Prior Studies Any changes since last visit?  no  Physicians involved in your care Any changes since last visit?  no   Family History  Adopted: Yes  Problem Relation Age of Onset  . Schizophrenia Son        Deceased 73  . Hypertension Daughter        Renal  . Healthy Son        x1  . Healthy Daughter        x2   Social History    Socioeconomic History  . Marital status: Single    Spouse name: Not on file  . Number of children: Y  . Years of education: Not on file  . Highest education level: Not on file  Occupational History  . Occupation: retired    Fish farm manager: RETIRED    CommentClinical cytogeneticist, had own firm  Social Needs  . Financial resource strain: Not on file  . Food insecurity:    Worry: Not on file    Inability: Not on file  . Transportation needs:    Medical: Not on file    Non-medical: Not on file  Tobacco Use  . Smoking status: Former Smoker    Packs/day: 1.00    Years: 25.00    Pack years: 25.00    Types: Cigarettes    Last attempt to quit: 03/16/1983    Years since quitting: 34.3  . Smokeless tobacco: Never Used  Substance and Sexual Activity  . Alcohol use: Yes    Comment: 2-3 glasses wine daily  . Drug use: No  . Sexual activity: Never    Comment: 1st intercourse 77 yo-Fewer than 5 partners  Lifestyle  . Physical activity:    Days per week: Not on file    Minutes per session: Not on file  . Stress: Not on file  Relationships  .  Social connections:    Talks on phone: Not on file    Gets together: Not on file    Attends religious service: Not on file    Active member of club or organization: Not on file    Attends meetings of clubs or organizations: Not on file    Relationship status: Not on file  Other Topics Concern  . 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                  Past Surgical History:  Procedure Laterality Date  . APPENDECTOMY    . CARPOMETACARPEL SUSPENSION PLASTY Right 08/07/2013   Procedure: CARPOMETACARPEL [facility]) SUSPENSION PLASTY RIGHT THUMB;  Surgeon: Wynonia Sours, MD;  Location: [place];  Service: Orthopedics;  Laterality: Right;  . CARPOMETACARPEL SUSPENSION PLASTY Left 10/23/2015   Procedure: SUSPENSION PLASTY LEFT THUMB TRAPEZIUM EXCISION ABDUCTOR POLLICIS LONGUS TRANSFER;  Surgeon: Daryll Brod, MD;  Location: [place];  Service: Orthopedics;  Laterality: Left;  . CERVICAL FUSION  2007   Dr. Valli Glance approach  . JOINT REPLACEMENT Left   . Left brachial artery repair of pseudoaneurysm  2001   post a cath  . LUMBAR FUSION  09/2004   T12-L5 (Dr. Patrice Paradise)  . MINOR IRRIGATION AND DEBRIDEMENT OF WOUND Right 08/27/2014   Procedure: MINOR IRRIGATION AND DEBRIDEMENT OF WOUND;  Surgeon: Daryll Brod, MD;  Location: [place];  Service: Orthopedics;  Laterality: Right;  . ORIF TIBIA FRACTURE  2010   Left distal  . Percutanous Transluminal Angioplasty of Renal Arteris    . REPAIR EXTENSOR TENDON Right 09/26/2014   Procedure: REPAIR EXTENSOR TENDON RIGHT RING FINGER ;  Surgeon: Daryll Brod, MD;  Location: [place];  Service: Orthopedics;  Laterality: Right;  . SHOULDER ARTHROSCOPY  09/2008   Left  . TONSILLECTOMY AND ADENOIDECTOMY    . TOTAL HIP ARTHROPLASTY  2008   left  . WISDOM TOOTH EXTRACTION     Past Medical History:  Diagnosis Date  . Abnormal blood pressure    left arm from old brachial artery repair  . Anemia   . Anxiety   . Atherosclerosis of renal artery ([place])   . Attention deficit disorder without mention of hyperactivity   . Basal cell carcinoma of face   . Benign heart murmur   . Carotid artery disease ([place])   . Carotid bruit    Left  . Centrilobular emphysema ([place])   . Chronic bronchitis ([place])   . Depression   . DJD (degenerative joint disease) of hip   . Esophageal reflux   . History of depression   . History of spinal fusion   . Hypertension   . Morton's neuroma    Hx of, Left  . Myalgia and myositis, unspecified   . Other tenosynovitis of hand and wrist   . Peripheral neuropathy   .  Personal history of alcoholism ([place])   . Personal history of peptic ulcer disease   . Pulmonary nodule   . Rotator cuff disorder    pain  . Solitary cyst of breast   . Subclavian steal syndrome   . Unspecified  hypothyroidism   . Urinary incontinence   . Wears glasses    BP (!) 141/77   Pulse (!) 107   Ht 5\' 3"  (1.6 m)   Wt 134 lb (60.8 kg)   SpO2 93%   BMI 23.74 kg/m   Opioid Risk Score:   Fall Risk Score:  `1  Depression screen PHQ 2/9  Depression screen [facility] 2/9 09/29/2016 06/10/2016 04/20/2015  Decreased Interest 0 0 0  Down, Depressed, Hopeless 1 0 0  PHQ - 2 Score 1 0 0  Altered sleeping 0 - -  Tired, decreased energy 0 - -  Change in appetite 0 - -  Feeling bad or failure about yourself  1 - -  Trouble concentrating 1 - -  Moving slowly or fidgety/restless 0 - -  Suicidal thoughts 0 - -  PHQ-9 Score 3 - -  Difficult doing work/chores Not difficult at all - -  Some recent data might be hidden    Review of Systems  Constitutional: Negative.   HENT: Negative.   Eyes: Negative.   Respiratory: Negative.   Cardiovascular: Negative.   Gastrointestinal: Negative.   Endocrine: Negative.   Genitourinary: Negative.   Musculoskeletal: Positive for arthralgias, back pain, gait problem and myalgias.  Skin: Negative.   Allergic/Immunologic: Negative.   Hematological: Negative.   Psychiatric/Behavioral: Negative.   All other systems reviewed and are negative.      Objective:   Physical Exam Gen: NAD. Vital signs reviewed HENT: Normocephalic, Atraumatic Eyes: EOMI. No discharge.  Cardio: RRR. No JVD. Pulm: B/l clear to auscultation.  Effort normal. Abd: Nondistended, BS+ MSK:  Gait wide based.   +Mild TTP left greater trochanter.   Neuro:   Strength  4+/5 in all LE myotomes Skin: Warm and Dry. Intact    Assessment & Plan:  77 y/o female with pmh/psh of idiopathic neuropathy, left shoulder OA, ADD, depression, bipolar disorder, subclavian steal syndrome, lumbar  fusion, left THA presents for follow up for chronic left hip pain.   1. Chronic left hip pain  CT reviewed from 5/18 showing postoperative THA changes  MRI L-spine reviewed, mild changes  No benefit with Voltaren gel, Robaxin, heat/cold  Will consider TENS IT after PT eval  Trail of weaning Gabapentin 900 TID to evaluate for benefit  Trial OTC Lidoderm patch, pt now states she does not feel she needs it  Cont PT, preference for aquatic therapy for pain, ROM, stretching, leg length eval, TENS eval  Will consider Mobic, but will try to avoid due to renal function   2. Gait abnormality  Cont cane for safety  3. Left greater trochanteric bursitis  No benefit with steroid injection  4. Idiopathic Neuropathy  Trial weaning Gabapentin

## 2017-07-18 DIAGNOSIS — R2689 Other abnormalities of gait and mobility: Secondary | ICD-10-CM | POA: Diagnosis not present

## 2017-07-18 DIAGNOSIS — M6281 Muscle weakness (generalized): Secondary | ICD-10-CM | POA: Diagnosis not present

## 2017-07-18 DIAGNOSIS — M25552 Pain in left hip: Secondary | ICD-10-CM | POA: Diagnosis not present

## 2017-07-18 DIAGNOSIS — R262 Difficulty in walking, not elsewhere classified: Secondary | ICD-10-CM | POA: Diagnosis not present

## 2017-07-19 ENCOUNTER — Other Ambulatory Visit: Payer: Self-pay | Admitting: Medical

## 2017-07-19 DIAGNOSIS — Z08 Encounter for follow-up examination after completed treatment for malignant neoplasm: Secondary | ICD-10-CM | POA: Diagnosis not present

## 2017-07-19 DIAGNOSIS — D485 Neoplasm of uncertain behavior of skin: Secondary | ICD-10-CM | POA: Diagnosis not present

## 2017-07-19 DIAGNOSIS — L570 Actinic keratosis: Secondary | ICD-10-CM | POA: Diagnosis not present

## 2017-07-19 DIAGNOSIS — C4441 Basal cell carcinoma of skin of scalp and neck: Secondary | ICD-10-CM | POA: Diagnosis not present

## 2017-07-19 DIAGNOSIS — Z85828 Personal history of other malignant neoplasm of skin: Secondary | ICD-10-CM | POA: Diagnosis not present

## 2017-07-19 DIAGNOSIS — L821 Other seborrheic keratosis: Secondary | ICD-10-CM | POA: Diagnosis not present

## 2017-07-20 DIAGNOSIS — M6281 Muscle weakness (generalized): Secondary | ICD-10-CM | POA: Diagnosis not present

## 2017-07-20 DIAGNOSIS — R2689 Other abnormalities of gait and mobility: Secondary | ICD-10-CM | POA: Diagnosis not present

## 2017-07-20 DIAGNOSIS — M25552 Pain in left hip: Secondary | ICD-10-CM | POA: Diagnosis not present

## 2017-07-20 DIAGNOSIS — R262 Difficulty in walking, not elsewhere classified: Secondary | ICD-10-CM | POA: Diagnosis not present

## 2017-07-28 ENCOUNTER — Telehealth: Payer: Self-pay

## 2017-07-28 NOTE — Telephone Encounter (Signed)
Patient called, stated she needs to have a prescription faxed or sent over to her physical therapy for a tens unit in order for them to assist her and treat her with it.  Last visit note states:  1. Chronic left hip pain             CT reviewed from 5/18 showing postoperative THA changes             MRI L-spine reviewed, mild changes             No benefit with Voltaren gel, Robaxin, heat/cold             Will consider TENS IT after PT eval             Trail of weaning Gabapentin 900 TID to evaluate for benefit             Trial OTC Lidoderm patch, pt now states she does not feel she needs it             Cont PT, preference for aquatic therapy for pain, ROM, stretching, leg length eval, TENS eval             Will consider Mobic, but will try to avoid due to renal function  Not sure how to precede from this point, please advise.

## 2017-07-28 NOTE — Telephone Encounter (Signed)
They should have TENS units which they can apply for the patient.  If it is effective, we can giver her a prescription for her own machine. Thanks.

## 2017-08-03 ENCOUNTER — Other Ambulatory Visit: Payer: Self-pay | Admitting: Physical Medicine & Rehabilitation

## 2017-08-11 ENCOUNTER — Encounter: Payer: Medicare Other | Admitting: Physical Medicine & Rehabilitation

## 2017-08-14 ENCOUNTER — Other Ambulatory Visit: Payer: Self-pay | Admitting: Medical

## 2017-08-17 ENCOUNTER — Encounter: Payer: Medicare Other | Admitting: Physical Medicine & Rehabilitation

## 2017-08-25 DIAGNOSIS — M6281 Muscle weakness (generalized): Secondary | ICD-10-CM | POA: Diagnosis not present

## 2017-08-25 DIAGNOSIS — R2689 Other abnormalities of gait and mobility: Secondary | ICD-10-CM | POA: Diagnosis not present

## 2017-08-25 DIAGNOSIS — R262 Difficulty in walking, not elsewhere classified: Secondary | ICD-10-CM | POA: Diagnosis not present

## 2017-08-25 DIAGNOSIS — M25552 Pain in left hip: Secondary | ICD-10-CM | POA: Diagnosis not present

## 2017-08-31 DIAGNOSIS — M6281 Muscle weakness (generalized): Secondary | ICD-10-CM | POA: Diagnosis not present

## 2017-08-31 DIAGNOSIS — R262 Difficulty in walking, not elsewhere classified: Secondary | ICD-10-CM | POA: Diagnosis not present

## 2017-08-31 DIAGNOSIS — R2689 Other abnormalities of gait and mobility: Secondary | ICD-10-CM | POA: Diagnosis not present

## 2017-08-31 DIAGNOSIS — M25552 Pain in left hip: Secondary | ICD-10-CM | POA: Diagnosis not present

## 2017-09-02 ENCOUNTER — Other Ambulatory Visit: Payer: Self-pay | Admitting: Physical Medicine & Rehabilitation

## 2017-09-02 DIAGNOSIS — R262 Difficulty in walking, not elsewhere classified: Secondary | ICD-10-CM | POA: Diagnosis not present

## 2017-09-02 DIAGNOSIS — M25552 Pain in left hip: Secondary | ICD-10-CM | POA: Diagnosis not present

## 2017-09-02 DIAGNOSIS — R2689 Other abnormalities of gait and mobility: Secondary | ICD-10-CM | POA: Diagnosis not present

## 2017-09-02 DIAGNOSIS — M6281 Muscle weakness (generalized): Secondary | ICD-10-CM | POA: Diagnosis not present

## 2017-09-02 NOTE — Telephone Encounter (Signed)
Recieved electronic medication refill reqeust for gabapentin.  On last note written on 07-14-17, it states:   4. Idiopathic Neuropathy             Trial weaning Gabapentin   Unsure if ok to refill medication as written due to this information.  Please advise.

## 2017-09-04 NOTE — Telephone Encounter (Signed)
Can we call the patient and ask how she did with the weaning trial and refill based on current medication regiment. Thanks.

## 2017-09-05 DIAGNOSIS — M6281 Muscle weakness (generalized): Secondary | ICD-10-CM | POA: Diagnosis not present

## 2017-09-05 DIAGNOSIS — R262 Difficulty in walking, not elsewhere classified: Secondary | ICD-10-CM | POA: Diagnosis not present

## 2017-09-05 DIAGNOSIS — R2689 Other abnormalities of gait and mobility: Secondary | ICD-10-CM | POA: Diagnosis not present

## 2017-09-05 DIAGNOSIS — M25552 Pain in left hip: Secondary | ICD-10-CM | POA: Diagnosis not present

## 2017-09-08 DIAGNOSIS — R2689 Other abnormalities of gait and mobility: Secondary | ICD-10-CM | POA: Diagnosis not present

## 2017-09-08 DIAGNOSIS — R262 Difficulty in walking, not elsewhere classified: Secondary | ICD-10-CM | POA: Diagnosis not present

## 2017-09-08 DIAGNOSIS — M6281 Muscle weakness (generalized): Secondary | ICD-10-CM | POA: Diagnosis not present

## 2017-09-08 DIAGNOSIS — M25552 Pain in left hip: Secondary | ICD-10-CM | POA: Diagnosis not present

## 2017-09-11 ENCOUNTER — Other Ambulatory Visit: Payer: Self-pay | Admitting: Medical

## 2017-09-12 DIAGNOSIS — R2689 Other abnormalities of gait and mobility: Secondary | ICD-10-CM | POA: Diagnosis not present

## 2017-09-12 DIAGNOSIS — M6281 Muscle weakness (generalized): Secondary | ICD-10-CM | POA: Diagnosis not present

## 2017-09-12 DIAGNOSIS — R262 Difficulty in walking, not elsewhere classified: Secondary | ICD-10-CM | POA: Diagnosis not present

## 2017-09-12 DIAGNOSIS — M25552 Pain in left hip: Secondary | ICD-10-CM | POA: Diagnosis not present

## 2017-09-16 DIAGNOSIS — M6281 Muscle weakness (generalized): Secondary | ICD-10-CM | POA: Diagnosis not present

## 2017-09-16 DIAGNOSIS — R2689 Other abnormalities of gait and mobility: Secondary | ICD-10-CM | POA: Diagnosis not present

## 2017-09-16 DIAGNOSIS — R262 Difficulty in walking, not elsewhere classified: Secondary | ICD-10-CM | POA: Diagnosis not present

## 2017-09-16 DIAGNOSIS — M25552 Pain in left hip: Secondary | ICD-10-CM | POA: Diagnosis not present

## 2017-09-20 DIAGNOSIS — M25552 Pain in left hip: Secondary | ICD-10-CM | POA: Diagnosis not present

## 2017-09-20 DIAGNOSIS — R262 Difficulty in walking, not elsewhere classified: Secondary | ICD-10-CM | POA: Diagnosis not present

## 2017-09-20 DIAGNOSIS — M6281 Muscle weakness (generalized): Secondary | ICD-10-CM | POA: Diagnosis not present

## 2017-09-20 DIAGNOSIS — R2689 Other abnormalities of gait and mobility: Secondary | ICD-10-CM | POA: Diagnosis not present

## 2017-09-21 ENCOUNTER — Encounter: Payer: Medicare Other | Attending: Physical Medicine & Rehabilitation | Admitting: Physical Medicine & Rehabilitation

## 2017-09-21 ENCOUNTER — Encounter: Payer: Self-pay | Admitting: Physical Medicine & Rehabilitation

## 2017-09-21 VITALS — BP 132/81 | HR 94 | Ht 63.0 in | Wt 134.0 lb

## 2017-09-21 DIAGNOSIS — F419 Anxiety disorder, unspecified: Secondary | ICD-10-CM | POA: Diagnosis not present

## 2017-09-21 DIAGNOSIS — M25552 Pain in left hip: Secondary | ICD-10-CM

## 2017-09-21 DIAGNOSIS — F319 Bipolar disorder, unspecified: Secondary | ICD-10-CM | POA: Insufficient documentation

## 2017-09-21 DIAGNOSIS — G609 Hereditary and idiopathic neuropathy, unspecified: Secondary | ICD-10-CM | POA: Diagnosis not present

## 2017-09-21 DIAGNOSIS — M19012 Primary osteoarthritis, left shoulder: Secondary | ICD-10-CM | POA: Insufficient documentation

## 2017-09-21 DIAGNOSIS — G458 Other transient cerebral ischemic attacks and related syndromes: Secondary | ICD-10-CM | POA: Diagnosis not present

## 2017-09-21 DIAGNOSIS — F988 Other specified behavioral and emotional disorders with onset usually occurring in childhood and adolescence: Secondary | ICD-10-CM | POA: Insufficient documentation

## 2017-09-21 DIAGNOSIS — R269 Unspecified abnormalities of gait and mobility: Secondary | ICD-10-CM

## 2017-09-21 DIAGNOSIS — K219 Gastro-esophageal reflux disease without esophagitis: Secondary | ICD-10-CM | POA: Diagnosis not present

## 2017-09-21 DIAGNOSIS — E039 Hypothyroidism, unspecified: Secondary | ICD-10-CM | POA: Insufficient documentation

## 2017-09-21 DIAGNOSIS — Z981 Arthrodesis status: Secondary | ICD-10-CM | POA: Diagnosis not present

## 2017-09-21 DIAGNOSIS — I10 Essential (primary) hypertension: Secondary | ICD-10-CM | POA: Insufficient documentation

## 2017-09-21 DIAGNOSIS — Z85828 Personal history of other malignant neoplasm of skin: Secondary | ICD-10-CM | POA: Diagnosis not present

## 2017-09-21 DIAGNOSIS — Z96642 Presence of left artificial hip joint: Secondary | ICD-10-CM | POA: Insufficient documentation

## 2017-09-21 DIAGNOSIS — Z87891 Personal history of nicotine dependence: Secondary | ICD-10-CM | POA: Diagnosis not present

## 2017-09-21 DIAGNOSIS — M7062 Trochanteric bursitis, left hip: Secondary | ICD-10-CM

## 2017-09-21 DIAGNOSIS — Z8711 Personal history of peptic ulcer disease: Secondary | ICD-10-CM | POA: Diagnosis not present

## 2017-09-21 DIAGNOSIS — G8929 Other chronic pain: Secondary | ICD-10-CM

## 2017-09-21 DIAGNOSIS — J42 Unspecified chronic bronchitis: Secondary | ICD-10-CM | POA: Diagnosis not present

## 2017-09-21 NOTE — Progress Notes (Signed)
Subjective:    Patient ID: Rose [name], female    DOB: September 28, 1940, 77 y.o.   MRN: [id number]  HPI 77 y/o female with pmh/psh of idiopathic neuropathy, left shoulder OA, ADD, depression, bipolar disorder, subclavian steal syndrome, lumbar fusion, left THA presents for follow up for chronic left hip pain. Started after hip replacement ~2005. Stable.  Rest improves.  Ambulation exacerbates the pain.  Sharp.  Non-radiating.  She was told it was nerve damage from the hip replacement.  Denies associated numbness.  Pt had hip injection in 07/2016 without benefit. ~6 fall in last year stumbling. Pain limits ambulation.   Last clinic visit 07/14/17.  Since that time, pt had PT and notes improvement in strength, but not in pain.  She tried TENS unit and notes benefit.  She has trouble using it with motion. She never trailed weaning Gabapenin.  She states the OTC lidoderm patch won't work. Denies falls. Pain mainly with prolonged ambulation of left hip.    Pain Inventory Average Pain 9 Pain Right Now 0, only hurts with walking My pain is intermittent and stabbing  In the last 24 hours, has pain interfered with the following? General activity 2 Relation with others 1 Enjoyment of life 1 What TIME of day is your pain at its worst? varies with activity Sleep (in general) Good  Pain is worse with: walking Pain improves with: rest and pacing activities Relief from Meds: na  Mobility walk without assistance use a cane how many minutes can you walk? 2-5 ability to climb steps?  yes do you drive?  no transfers alone  Function retired Do you have any goals in this area?  no  Neuro/Psych bladder control problems trouble walking  Prior Studies Any changes since last visit?  no  Physicians involved in your care Any changes since last visit?  no   Family History  Adopted: Yes  Problem Relation Age of Onset  . Schizophrenia Son        Deceased 7  . Hypertension Daughter    Renal  . Healthy Son        x1  . Healthy Daughter        x2   Social History   Socioeconomic History  . Marital status: Single    Spouse name: Not on file  . Number of children: Y  . Years of education: Not on file  . Highest education level: Not on file  Occupational History  . Occupation: retired    Fish farm manager: RETIRED    CommentClinical cytogeneticist, had own firm  Social Needs  . Financial resource strain: Not on file  . Food insecurity:    Worry: Not on file    Inability: Not on file  . Transportation needs:    Medical: Not on file    Non-medical: Not on file  Tobacco Use  . Smoking status: Former Smoker    Packs/day: 1.00    Years: 25.00    Pack years: 25.00    Types: Cigarettes    Last attempt to quit: 03/16/1983    Years since quitting: 34.5  . Smokeless tobacco: Never Used  Substance and Sexual Activity  . Alcohol use: Yes    Comment: 2-3 glasses wine daily  . Drug use: No  . Sexual activity: Never    Comment: 1st intercourse 77 yo-Fewer than 5 partners  Lifestyle  . Physical activity:    Days per week: Not on file    Minutes per session: Not on  file  . Stress: Not on file  Relationships  . Social connections:    Talks on phone: Not on file    Gets together: Not on file    Attends religious service: Not on file    Active member of club or organization: Not on file    Attends meetings of clubs or organizations: Not on file    Relationship status: Not on file  Other Topics Concern  . 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                  Past Surgical History:  Procedure Laterality Date  . APPENDECTOMY    . CARPOMETACARPEL SUSPENSION PLASTY Right 08/07/2013   Procedure: CARPOMETACARPEL [facility])  SUSPENSION PLASTY RIGHT THUMB;  Surgeon: Wynonia Sours, MD;  Location: [place];  Service: Orthopedics;  Laterality: Right;  . CARPOMETACARPEL SUSPENSION PLASTY Left 10/23/2015   Procedure: SUSPENSION PLASTY LEFT THUMB TRAPEZIUM EXCISION ABDUCTOR POLLICIS LONGUS TRANSFER;  Surgeon: Daryll Brod, MD;  Location: [place];  Service: Orthopedics;  Laterality: Left;  . CERVICAL FUSION  2007   Dr. Valli Glance approach  . JOINT REPLACEMENT Left   . Left brachial artery repair of pseudoaneurysm  2001   post a cath  . LUMBAR FUSION  09/2004   T12-L5 (Dr. Patrice Paradise)  . MINOR IRRIGATION AND DEBRIDEMENT OF WOUND Right 08/27/2014   Procedure: MINOR IRRIGATION AND DEBRIDEMENT OF WOUND;  Surgeon: Daryll Brod, MD;  Location: [place];  Service: Orthopedics;  Laterality: Right;  . ORIF TIBIA FRACTURE  2010   Left distal  . Percutanous Transluminal Angioplasty of Renal Arteris    . REPAIR EXTENSOR TENDON Right 09/26/2014   Procedure: REPAIR EXTENSOR TENDON RIGHT RING FINGER ;  Surgeon: Daryll Brod, MD;  Location: [place];  Service: Orthopedics;  Laterality: Right;  . SHOULDER ARTHROSCOPY  09/2008   Left  . TONSILLECTOMY AND ADENOIDECTOMY    . TOTAL HIP ARTHROPLASTY  2008   left  . WISDOM TOOTH EXTRACTION     Past Medical History:  Diagnosis Date  . Abnormal blood pressure    left arm from old brachial artery repair  . Anemia   . Anxiety   . Atherosclerosis of renal artery ([place])   . Attention deficit disorder without mention of hyperactivity   . Basal cell carcinoma of face   . Benign heart murmur   . Carotid artery disease ([place])   . Carotid bruit    Left  . Centrilobular emphysema ([place])   . Chronic bronchitis ([employer])   . Depression   . DJD (degenerative joint disease) of hip   . Esophageal reflux   . History of depression   . History of spinal fusion   . Hypertension   . Morton's neuroma    Hx of, Left  . Myalgia and myositis,  unspecified   . Other tenosynovitis of hand and wrist   . Peripheral neuropathy   . Personal history of alcoholism ([place])   . Personal history of peptic ulcer disease   . Pulmonary nodule   . Rotator cuff disorder    pain  . Solitary cyst of  breast   . Subclavian steal syndrome   . Unspecified hypothyroidism   . Urinary incontinence   . Wears glasses    BP 132/81   Pulse 94   Ht 5\' 3"  (1.6 m)   Wt 134 lb (60.8 kg)   SpO2 92%   BMI 23.74 kg/m   Opioid Risk Score:   Fall Risk Score:  `1  Depression screen PHQ 2/9  Depression screen [facility] 2/9 09/29/2016 06/10/2016 04/20/2015  Decreased Interest 0 0 0  Down, Depressed, Hopeless 1 0 0  PHQ - 2 Score 1 0 0  Altered sleeping 0 - -  Tired, decreased energy 0 - -  Change in appetite 0 - -  Feeling bad or failure about yourself  1 - -  Trouble concentrating 1 - -  Moving slowly or fidgety/restless 0 - -  Suicidal thoughts 0 - -  PHQ-9 Score 3 - -  Difficult doing work/chores Not difficult at all - -  Some recent data might be hidden    Review of Systems  Constitutional: Negative.   HENT: Negative.   Eyes: Negative.   Respiratory: Negative.   Cardiovascular: Negative.   Gastrointestinal: Negative.   Endocrine: Negative.   Genitourinary: Positive for difficulty urinating.  Musculoskeletal: Positive for arthralgias, back pain, gait problem and myalgias.  Skin: Negative.   Allergic/Immunologic: Negative.   Hematological: Negative.   Psychiatric/Behavioral: Negative.   All other systems reviewed and are negative.      Objective:   Physical Exam Gen: NAD. Vital signs reviewed HENT: Normocephalic, Atraumatic Eyes: EOMI. No discharge.  Cardio: RRR. No JVD. Pulm: B/l clear to auscultation.  Effort normal. Abd: Nondistended, BS+ MSK:  Gait wide based.   +Mild TTP left greater trochanter.   Neuro:   Strength  4+/5 in all LE myotomes Skin: Warm and Dry. Intact    Assessment & Plan:  77 y/o female with pmh/psh of  idiopathic neuropathy, left shoulder OA, ADD, depression, bipolar disorder, subclavian steal syndrome, lumbar fusion, left THA presents for follow up for chronic left hip pain.   1. Chronic left hip pain  Pain after prolonged ambulation, improved with short rest break  CT reviewed from 5/18 showing postoperative THA changes  MRI L-spine reviewed, mild changes  No benefit with Voltaren gel, Robaxin, heat/cold, Tylenol  Encouraged trial of TENS IT with mobility  Trail of weaning Gabapentin 900 TID to evaluate for benefit, encouraged trial again  Trial OTC Lidoderm patch, stateed she does not feel she needs it, now states she does not feel it will work  PT with benefit in strength, limited for pain  Trial Naproxen prior to activity (undocumented reason for allergy for NSAIDs, pt does not recall, but states it was nothing major)  Will consider Tramadol   2. Gait abnormality  Cont cane for safety  3. Left greater trochanteric bursitis  No benefit with steroid injection  4. Idiopathic Neuropathy  Trial weaning Gabapentin, reminded again

## 2017-09-28 NOTE — Progress Notes (Deleted)
Subjective:   Rose [name] is a 77 y.o. female who presents for Medicare Annual (Subsequent) preventive examination.  Review of Systems: No ROS.  Medicare Wellness Visit. Additional risk factors are reflected in the social history.  Sleep patterns:  Home Safety/Smoke Alarms: Feels safe in home. Smoke alarms in place.  Living environment; residence and Firearm Safety: Lives alone in 1st floor apt.   Female:   Pap- utd      Mammo-       Dexa scan- utd       CCS- last 2004      Objective:     Vitals: There were no vitals taken for this visit.  There is no height or weight on file to calculate BMI.  Advanced Directives 11/19/2016 09/29/2016 06/10/2016 01/05/2016 10/23/2015 10/20/2015 09/17/2015  Does Patient Have a Medical Advance Directive? Yes Yes Yes Yes Yes Yes Yes  Type of Paramedic of [place];Living will [place];Living will [place];Living will Living will - [place];Living will Living will  Does patient want to make changes to medical advance directive? - - - - - - -  Copy of [place] in Chart? No - copy requested - No - copy requested - No - copy requested - -  Would patient like information on creating a medical advance directive? - - - - - - -    Tobacco Social History   Tobacco Use  Smoking Status Former Smoker  . Packs/day: 1.00  . Years: 25.00  . Pack years: 25.00  . Types: Cigarettes  . Last attempt to quit: 03/16/1983  . Years since quitting: 34.5  Smokeless Tobacco Never Used     Counseling given: Not Answered   Clinical Intake:                       Past Medical History:  Diagnosis Date  . Abnormal blood pressure    left arm from old brachial artery repair  . Anemia   . Anxiety   . Atherosclerosis of renal artery ([place])   . Attention deficit disorder without mention of hyperactivity   . Basal cell carcinoma of face   . Benign heart  murmur   . Carotid artery disease ([place])   . Carotid bruit    Left  . Centrilobular emphysema ([place])   . Chronic bronchitis ([place])   . Depression   . DJD (degenerative joint disease) of hip   . Esophageal reflux   . History of depression   . History of spinal fusion   . Hypertension   . Morton's neuroma    Hx of, Left  . Myalgia and myositis, unspecified   . Other tenosynovitis of hand and wrist   . Peripheral neuropathy   . Personal history of alcoholism ([place])   . Personal history of peptic ulcer disease   . Pulmonary nodule   . Rotator cuff disorder    pain  . Solitary cyst of breast   . Subclavian steal syndrome   . Unspecified hypothyroidism   . Urinary incontinence   . Wears glasses    Past Surgical History:  Procedure Laterality Date  . APPENDECTOMY    . CARPOMETACARPEL SUSPENSION PLASTY Right 08/07/2013   Procedure: CARPOMETACARPEL [facility]) SUSPENSION PLASTY RIGHT THUMB;  Surgeon: Wynonia Sours, MD;  Location: [place];  Service: Orthopedics;  Laterality: Right;  . CARPOMETACARPEL SUSPENSION PLASTY Left 10/23/2015  Procedure: SUSPENSION PLASTY LEFT THUMB TRAPEZIUM EXCISION ABDUCTOR POLLICIS LONGUS TRANSFER;  Surgeon: Daryll Brod, MD;  Location: [place];  Service: Orthopedics;  Laterality: Left;  . CERVICAL FUSION  2007   Dr. Valli Glance approach  . JOINT REPLACEMENT Left   . Left brachial artery repair of pseudoaneurysm  2001   post a cath  . LUMBAR FUSION  09/2004   T12-L5 (Dr. Patrice Paradise)  . MINOR IRRIGATION AND DEBRIDEMENT OF WOUND Right 08/27/2014   Procedure: MINOR IRRIGATION AND DEBRIDEMENT OF WOUND;  Surgeon: Daryll Brod, MD;  Location: [place];  Service: Orthopedics;  Laterality: Right;  . ORIF TIBIA FRACTURE  2010   Left distal  . Percutanous Transluminal Angioplasty of Renal Arteris    . REPAIR EXTENSOR TENDON Right 09/26/2014   Procedure: REPAIR EXTENSOR TENDON RIGHT RING FINGER ;  Surgeon: Daryll Brod, MD;   Location: [place];  Service: Orthopedics;  Laterality: Right;  . SHOULDER ARTHROSCOPY  09/2008   Left  . TONSILLECTOMY AND ADENOIDECTOMY    . TOTAL HIP ARTHROPLASTY  2008   left  . WISDOM TOOTH EXTRACTION     Family History  Adopted: Yes  Problem Relation Age of Onset  . Schizophrenia Son        Deceased 67  . Hypertension Daughter        Renal  . Healthy Son        x1  . Healthy Daughter        x2   Social History   Socioeconomic History  . Marital status: Single    Spouse name: Not on file  . Number of children: Y  . Years of education: Not on file  . Highest education level: Not on file  Occupational History  . Occupation: retired    Fish farm manager: RETIRED    CommentClinical cytogeneticist, had own firm  Social Needs  . Financial resource strain: Not on file  . Food insecurity:    Worry: Not on file    Inability: Not on file  . Transportation needs:    Medical: Not on file    Non-medical: Not on file  Tobacco Use  . Smoking status: Former Smoker    Packs/day: 1.00    Years: 25.00    Pack years: 25.00    Types: Cigarettes    Last attempt to quit: 03/16/1983    Years since quitting: 34.5  . Smokeless tobacco: Never Used  Substance and Sexual Activity  . Alcohol use: Yes    Comment: 2-3 glasses wine daily  . Drug use: No  . Sexual activity: Never    Comment: 1st intercourse 77 yo-Fewer than 5 partners  Lifestyle  . Physical activity:    Days per week: Not on file    Minutes per session: Not on file  . Stress: Not on file  Relationships  . Social connections:    Talks on phone: Not on file    Gets together: Not on file    Attends religious service: Not on file    Active member of club or organization: Not on file    Attends meetings of clubs or organizations: Not on file    Relationship status: Not on file  Other Topics Concern  . 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                   Outpatient Encounter Medications as of 10/06/2017  Medication Sig  . alendronate (FOSAMAX) 70 MG tablet Take 1 tablet (70 mg total) by mouth every 7 (seven) days. Take with a full glass of water on an empty stomach.  Marland Kitchen amLODipine (NORVASC) 5 MG tablet TAKE ONE TABLET BY MOUTH TWICE A DAY  . aspirin 81 MG tablet Take 81 mg by mouth daily.   Marland Kitchen atomoxetine (STRATTERA) 100 MG capsule Take 100 mg by mouth daily.  . BuPROPion HCl (WELLBUTRIN PO) Take by mouth.  . cloNIDine (CATAPRES) 0.1 MG tablet Take 1 tablet (0.1 mg total) by mouth 3 (three) times daily.  . DULoxetine (CYMBALTA) 60 MG capsule TAKE ONE CAPSULE BY MOUTH DAILY  . ferrous sulfate dried (SLOW FE) 160 (50 FE) MG TBCR Take 160 mg by mouth 2 (two) times daily. Patient has been taking Once Daily  . gabapentin (NEURONTIN) 300 MG capsule TAKE THREE CAPSULES BY MOUTH THREE TIMES A DAY  . Glucosamine 500 MG TABS Take 1 tablet by mouth 2 (two) times daily. Glucosamine-Chondronton  . levothyroxine (SYNTHROID, LEVOTHROID) 112 MCG tablet TAKE ONE TABLET BY MOUTH DAILY BEFORE BREAKFAST  . losartan (COZAAR) 100 MG tablet TAKE ONE TABLET BY MOUTH DAILY. OFFICE VISIT NEEDED FOR FURTHER REFILLS  . OLANZapine (ZYPREXA) 5 MG tablet Take 15 mg by mouth at bedtime.   Marland Kitchen oxybutynin (DITROPAN-XL) 10 MG 24 hr tablet Take 1 tablet (10 mg total) by mouth at bedtime.   No facility-administered encounter medications on file as of 10/06/2017.     Activities of Daily Living No flowsheet data found.  Patient Care Team: Saguier, Iris Pert as PCP - General (Internal Medicine) Lyndal Pulley, DO as Attending Physician (Sports Medicine)    Assessment:   This is a routine wellness examination for [place]. Physical assessment deferred to  PCP.  Exercise Activities and Dietary recommendations   Diet (meal preparation, eat out, water intake, caffeinated beverages, dairy products, fruits and vegetables): {Desc; diets:16563} Breakfast: Lunch:  Dinner:      Goals    None      Fall Risk Fall Risk  09/21/2017 07/14/2017 06/03/2017 11/19/2016 09/29/2016  Falls in the past year? Yes No Yes Yes Yes  Comment - - - last fall August 2018 -  Number falls in past yr: 1 - 1 2 or more 2 or more  Injury with Fall? No - No No No  Risk Factor Category  - - - High Fall Risk High Fall Risk  Risk for fall due to : Impaired mobility;Impaired balance/gait - Impaired mobility;Impaired balance/gait;History of fall(s) - Impaired balance/gait;Impaired mobility  Follow up - - - - Falls prevention discussed;Education provided   Depression Screen PHQ 2/9 Scores 09/29/2016 06/10/2016 04/20/2015  PHQ - 2 Score 1 0 0  PHQ- 9 Score 3 - -     Cognitive Function MMSE - Mini Mental State Exam 06/10/2016  Orientation to time 5  Orientation to Place 5  Registration 3  Attention/ Calculation 5  Recall 2  Language- name 2 objects 2  Language- repeat 1  Language- follow 3 step command 3  Language- read & follow direction 1  Write a sentence 1  Copy design 1  Total score 29        Immunization History  Administered Date(s) Administered  . PPD Test 10/03/2013, 05/14/2014, 05/17/2014  . Pneumococcal Polysaccharide-23 11/29/2007  . Td 03/16/1999, 11/25/2009   Screening Tests Health Maintenance  Topic Date Due  . PNA vac Low Risk Adult (2 of 2 - PCV13) 06/23/2018 (Originally 11/28/2008)  . INFLUENZA VACCINE  10/13/2017  . TETANUS/TDAP  11/26/2019  . DEXA SCAN  Completed      Plan:   ***   I have personally reviewed and noted the following in the patient's chart:   . Medical and social history . Use of alcohol, tobacco or illicit drugs  . Current medications and supplements . Functional ability and status . Nutritional status . Physical  activity . Advanced directives . List of other physicians . Hospitalizations, surgeries, and ER visits in previous 12 months . Vitals . Screenings to include cognitive, depression, and falls . Referrals and appointments  In addition, I have reviewed and discussed with patient certain preventive protocols, quality metrics, and best practice recommendations. A written personalized care plan for preventive services as well as general preventive health recommendations were provided to patient.     Daisy, [place], [place]  09/28/2017

## 2017-10-06 ENCOUNTER — Ambulatory Visit: Payer: Medicare Other | Admitting: *Deleted

## 2017-10-11 ENCOUNTER — Ambulatory Visit: Payer: Medicare Other | Admitting: *Deleted

## 2017-10-17 ENCOUNTER — Other Ambulatory Visit: Payer: Self-pay | Admitting: Medical

## 2017-10-19 NOTE — Progress Notes (Addendum)
Subjective:   Rose NICOL is a 77 y.o. female who presents for Medicare Annual (Subsequent) preventive examination.  Review of Systems: No ROS.  Medicare Wellness Visit. Additional risk factors are reflected in the social history. Cardiac Risk Factors include: advanced age (>5men, >74 women);hypertension Sleep patterns: Sleeps 8-9 hrs per night. Home Safety/Smoke Alarms: Feels safe in home. Smoke alarms in place.  Living environment; residence and Firearm Safety: Lives alone on first floor apt.   Female:         Mammo-declines    Dexa scan- declines CCS- declines     Objective:     Vitals: BP 110/60 (BP Location: Left Arm, Patient Position: Sitting, Cuff Size: Normal)   Pulse 79   Ht 5\' 3"  (1.6 m)   Wt 129 lb 3.2 oz (58.6 kg)   SpO2 96%   BMI 22.89 kg/m   Body mass index is 22.89 kg/m.  Advanced Directives 10/20/2017 11/19/2016 09/29/2016 06/10/2016 01/05/2016 10/23/2015 10/20/2015  Does Patient Have a Medical Advance Directive? Yes Yes Yes Yes Yes Yes Yes  Type of Paramedic of [place];Living will [place];Living will [place];Living will [place];Living will Living will - [place];Living will  Does patient want to make changes to medical advance directive? No - Patient declined - - - - - -  Copy of [place] in Chart? No - copy requested No - copy requested - No - copy requested - No - copy requested -  Would patient like information on creating a medical advance directive? - - - - - - -    Tobacco Social History   Tobacco Use  Smoking Status Former Smoker  . Packs/day: 1.00  . Years: 25.00  . Pack years: 25.00  . Types: Cigarettes  . Last attempt to quit: 03/16/1983  . Years since quitting: 34.6  Smokeless Tobacco Never Used     Counseling given: Not Answered   Clinical Intake:     Pain : No/denies pain                 Past  Medical History:  Diagnosis Date  . Abnormal blood pressure    left arm from old brachial artery repair  . Anemia   . Anxiety   . Atherosclerosis of renal artery ([place])   . Attention deficit disorder without mention of hyperactivity   . Basal cell carcinoma of face   . Benign heart murmur   . Carotid artery disease ([place])   . Carotid bruit    Left  . Centrilobular emphysema ([place])   . Chronic bronchitis ([place])   . Depression   . DJD (degenerative joint disease) of hip   . Esophageal reflux   . History of depression   . History of spinal fusion   . Hypertension   . Morton's neuroma    Hx of, Left  . Myalgia and myositis, unspecified   . Other tenosynovitis of hand and wrist   . Peripheral neuropathy   . Personal history of alcoholism ([place])   . Personal history of peptic ulcer disease   . Pulmonary nodule   . Rotator cuff disorder    pain  . Solitary cyst of breast   . Subclavian steal syndrome   . Unspecified hypothyroidism   . Urinary incontinence   . Wears glasses    Past Surgical History:  Procedure Laterality Date  . APPENDECTOMY    . CARPOMETACARPEL  SUSPENSION PLASTY Right 08/07/2013   Procedure: CARPOMETACARPEL [facility]) SUSPENSION PLASTY RIGHT THUMB;  Surgeon: Wynonia Sours, MD;  Location: [place];  Service: Orthopedics;  Laterality: Right;  . CARPOMETACARPEL SUSPENSION PLASTY Left 10/23/2015   Procedure: SUSPENSION PLASTY LEFT THUMB TRAPEZIUM EXCISION ABDUCTOR POLLICIS LONGUS TRANSFER;  Surgeon: Daryll Brod, MD;  Location: [place];  Service: Orthopedics;  Laterality: Left;  . CERVICAL FUSION  2007   Dr. Valli Glance approach  . JOINT REPLACEMENT Left   . Left brachial artery repair of pseudoaneurysm  2001   post a cath  . LUMBAR FUSION  09/2004   T12-L5 (Dr. Patrice Paradise)  . MINOR IRRIGATION AND DEBRIDEMENT OF WOUND Right 08/27/2014   Procedure: MINOR IRRIGATION AND DEBRIDEMENT OF WOUND;  Surgeon: Daryll Brod, MD;  Location: [place];  Service: Orthopedics;  Laterality: Right;  . ORIF TIBIA FRACTURE  2010   Left distal  . Percutanous Transluminal Angioplasty of Renal Arteris    . REPAIR EXTENSOR TENDON Right 09/26/2014   Procedure: REPAIR EXTENSOR TENDON RIGHT RING FINGER ;  Surgeon: Daryll Brod, MD;  Location: [place];  Service: Orthopedics;  Laterality: Right;  . SHOULDER ARTHROSCOPY  09/2008   Left  . TONSILLECTOMY AND ADENOIDECTOMY    . TOTAL HIP ARTHROPLASTY  2008   left  . WISDOM TOOTH EXTRACTION     Family History  Adopted: Yes  Problem Relation Age of Onset  . Schizophrenia Son        Deceased 47  . Hypertension Daughter        Renal  . Healthy Son        x1  . Healthy Daughter        x2   Social History   Socioeconomic History  . Marital status: Single    Spouse name: Not on file  . Number of children: Y  . Years of education: Not on file  . Highest education level: Not on file  Occupational History  . Occupation: retired    Fish farm manager: RETIRED    CommentClinical cytogeneticist, had own firm  Social Needs  . Financial resource strain: Not on file  . Food insecurity:    Worry: Not on file    Inability: Not on file  . Transportation needs:    Medical: Not on file    Non-medical: Not on file  Tobacco Use  . Smoking status: Former Smoker    Packs/day: 1.00    Years: 25.00    Pack years: 25.00    Types: Cigarettes    Last attempt to quit: 03/16/1983    Years since quitting: 34.6  . Smokeless tobacco: Never Used  Substance and Sexual Activity  . Alcohol use: Yes    Comment: 2-3 glasses wine daily  . Drug use: No  . Sexual activity: Never    Comment: 1st intercourse 77 yo-Fewer than 5 partners  Lifestyle  . Physical activity:    Days per week: Not on file    Minutes per session: Not on file  . Stress: Not on file  Relationships  . Social connections:    Talks on phone: Not on file    Gets together: Not on file    Attends religious service: Not on file    Active  member of club or organization: Not on file    Attends meetings of clubs or organizations: Not on file    Relationship status: Not on file  Other Topics Concern  . 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                   Outpatient Encounter Medications as of 10/20/2017  Medication Sig  . alendronate (FOSAMAX) 70 MG tablet Take 1 tablet (70 mg total) by mouth every 7 (seven) days. Take with a full glass of water on an empty stomach.  Marland Kitchen amLODipine (NORVASC) 5 MG tablet TAKE ONE TABLET BY MOUTH TWICE A DAY  . aspirin 81 MG tablet Take 81 mg by mouth daily.   Marland Kitchen atomoxetine (STRATTERA) 100 MG capsule Take 100 mg by mouth daily.  . BuPROPion HCl (WELLBUTRIN PO) Take by mouth.  . cloNIDine (CATAPRES) 0.1 MG tablet Take 1 tablet (0.1 mg total) by mouth 3 (three) times daily.  . DULoxetine (CYMBALTA) 60 MG capsule TAKE ONE CAPSULE BY MOUTH DAILY  . ferrous sulfate dried (SLOW FE) 160 (50 FE) MG TBCR Take 160 mg by mouth 2 (two) times daily. Patient has been taking Once Daily  . Glucosamine 500 MG TABS Take 1 tablet by mouth 2 (two) times daily. Glucosamine-Chondronton  . levothyroxine (SYNTHROID, LEVOTHROID) 112 MCG tablet TAKE ONE TABLET BY MOUTH DAILY BEFORE BREAKFAST  . losartan (COZAAR) 100 MG tablet TAKE ONE TABLET BY MOUTH DAILY. OFFICE VISIT NEEDED FOR FURTHER REFILLS  . OLANZapine (ZYPREXA) 5 MG tablet Take 15 mg by mouth at bedtime.   Marland Kitchen oxybutynin (DITROPAN-XL) 10 MG 24 hr tablet Take 1 tablet (10 mg total) by mouth at bedtime.  . gabapentin (NEURONTIN) 300 MG capsule TAKE THREE CAPSULES BY MOUTH THREE TIMES A DAY (Patient not taking: Reported on 10/20/2017)  . [DISCONTINUED] cloNIDine (CATAPRES) 0.1 MG tablet TAKE ONE TABLET  BY MOUTH THREE TIMES A DAY   No facility-administered encounter medications on file as of 10/20/2017.     Activities of Daily Living In your present state of health, do you have any difficulty performing the following activities: 10/20/2017  Hearing? N  Vision? N  Difficulty concentrating or making decisions? N  Walking or climbing stairs? N  Dressing or bathing? N  Doing errands, shopping? Y  Comment no longer driving. A friend takes her. Does use golfcart.  Preparing Food and eating ? N  Using the Toilet? N  In the past six months, have you accidently leaked urine? N  Do you have problems with loss of bowel control? N  Managing your Medications? N  Managing your Finances? N  Housekeeping or managing your Housekeeping? N  Comment cleaning lady comes every 2 weeks  Some recent data might be hidden    Patient Care Team: Saguier, Iris Pert as PCP - General (Internal Medicine) Lyndal Pulley, DO as Attending Physician (Sports Medicine)    Assessment:   This is a routine wellness examination for [place]. Physical assessment deferred to PCP.  Exercise Activities and Dietary recommendations Current Exercise Habits: The patient does not participate in regular exercise at present Diet (meal preparation, eat out, water intake, caffeinated beverages, dairy products, fruits and vegetables): well balanced, on average, 2 meals per day  Goals    . <enter goal here> (pt-stated)     Complete bible study course.  Fall Risk Fall Risk  10/20/2017 09/21/2017 07/14/2017 06/03/2017 11/19/2016  Falls in the past year? No Yes No Yes Yes  Comment - - - - last fall August 2018  Number falls in past yr: - 1 - 1 2 or more  Injury with Fall? - No - No No  Risk Factor Category  - - - - High Fall Risk  Risk for fall due to : - Impaired mobility;Impaired balance/gait - Impaired mobility;Impaired balance/gait;History of fall(s) -  Follow up - - - - -    Depression Screen PHQ 2/9 Scores 10/20/2017  09/29/2016 06/10/2016 04/20/2015  PHQ - 2 Score 1 1 0 0  PHQ- 9 Score - 3 - -     Cognitive Function MMSE - Mini Mental State Exam 10/20/2017 06/10/2016  Orientation to time 5 5  Orientation to Place 5 5  Registration 3 3  Attention/ Calculation 5 5  Recall 2 2  Language- name 2 objects 2 2  Language- repeat 1 1  Language- follow 3 step command 3 3  Language- read & follow direction 1 1  Write a sentence 1 1  Copy design 1 1  Total score 29 29        Immunization History  Administered Date(s) Administered  . PPD Test 10/03/2013, 05/14/2014, 05/17/2014  . Pneumococcal Polysaccharide-23 11/29/2007  . Td 03/16/1999, 11/25/2009    Screening Tests Health Maintenance  Topic Date Due  . INFLUENZA VACCINE  10/13/2017  . PNA vac Low Risk Adult (2 of 2 - PCV13) 06/23/2018 (Originally 11/28/2008)  . TETANUS/TDAP  11/26/2019  . DEXA SCAN  Completed       Plan:     Please schedule your next medicare wellness visit with me in 1 yr.  Continue to eat heart healthy diet (full of fruits, vegetables, whole grains, lean protein, water--limit salt, fat, and sugar intake) and increase physical activity as tolerated.  Continue doing brain stimulating activities (puzzles, reading, adult coloring books, staying active) to keep memory sharp.     I have personally reviewed and noted the following in the patient's chart:   . Medical and social history . Use of alcohol, tobacco or illicit drugs  . Current medications and supplements . Functional ability and status . Nutritional status . Physical activity . Advanced directives . List of other physicians . Hospitalizations, surgeries, and ER visits in previous 12 months . Vitals . Screenings to include cognitive, depression, and falls . Referrals and appointments  In addition, I have reviewed and discussed with patient certain preventive protocols, quality metrics, and best practice recommendations. A written personalized care plan for  preventive services as well as general preventive health recommendations were provided to patient.     Rilei, [place], [place]  10/20/2017   Medical screening examination/treatment was performed by qualified clinical staff member and as supervising physician I was immediately available for consultation/collaboration. I have reviewed documentation and agree with assessment and plan.  Penni Homans, MD

## 2017-10-20 ENCOUNTER — Ambulatory Visit (INDEPENDENT_AMBULATORY_CARE_PROVIDER_SITE_OTHER): Payer: Medicare Other | Admitting: *Deleted

## 2017-10-20 ENCOUNTER — Encounter: Payer: Self-pay | Admitting: *Deleted

## 2017-10-20 VITALS — BP 110/60 | HR 79 | Ht 63.0 in | Wt 129.2 lb

## 2017-10-20 DIAGNOSIS — Z0000 Encounter for general adult medical examination without abnormal findings: Secondary | ICD-10-CM

## 2017-10-20 NOTE — Patient Instructions (Signed)
Please schedule your next medicare wellness visit with me in 1 yr.  Continue to eat heart healthy diet (full of fruits, vegetables, whole grains, lean protein, water--limit salt, fat, and sugar intake) and increase physical activity as tolerated.  Continue doing brain stimulating activities (puzzles, reading, adult coloring books, staying active) to keep memory sharp.    Rose [name] , Thank you for taking time to come for your Medicare Wellness Visit. I appreciate your ongoing commitment to your health goals. Please review the following plan we discussed and let me know if I can assist you in the future.   These are the goals we discussed: Goals    . <enter goal here> (pt-stated)     Complete bible study course.       This is a list of the screening recommended for you and due dates:  Health Maintenance  Topic Date Due  . Flu Shot  10/13/2017  . Pneumonia vaccines (2 of 2 - PCV13) 06/23/2018*  . Tetanus Vaccine  11/26/2019  . DEXA scan (bone density measurement)  Completed  *Topic was postponed. The date shown is not the original due date.    Health Maintenance for Postmenopausal Women Menopause is a normal process in which your reproductive ability comes to an end. This process happens gradually over a span of months to years, usually between the ages of 53 and 9. Menopause is complete when you have missed 12 consecutive menstrual periods. It is important to talk with your health care provider about some of the most common conditions that affect postmenopausal women, such as heart disease, cancer, and bone loss (osteoporosis). Adopting a healthy lifestyle and getting preventive care can help to promote your health and wellness. Those actions can also lower your chances of developing some of these common conditions. What should I know about menopause? During menopause, you may experience a number of symptoms, such as:  Moderate-to-severe hot flashes.  Night sweats.  Decrease in  sex drive.  Mood swings.  Headaches.  Tiredness.  Irritability.  Memory problems.  Insomnia.  Choosing to treat or not to treat menopausal changes is an individual decision that you make with your health care provider. What should I know about hormone replacement therapy and supplements? Hormone therapy products are effective for treating symptoms that are associated with menopause, such as hot flashes and night sweats. Hormone replacement carries certain risks, especially as you become older. If you are thinking about using estrogen or estrogen with progestin treatments, discuss the benefits and risks with your health care provider. What should I know about heart disease and stroke? Heart disease, heart attack, and stroke become more likely as you age. This may be due, in part, to the hormonal changes that your body experiences during menopause. These can affect how your body processes dietary fats, triglycerides, and cholesterol. Heart attack and stroke are both medical emergencies. There are many things that you can do to help prevent heart disease and stroke:  Have your blood pressure checked at least every 1-2 years. High blood pressure causes heart disease and increases the risk of stroke.  If you are 65-49 years old, ask your health care provider if you should take aspirin to prevent a heart attack or a stroke.  Do not use any tobacco products, including cigarettes, chewing tobacco, or electronic cigarettes. If you need help quitting, ask your health care provider.  It is important to eat a healthy diet and maintain a healthy weight. ? Be sure to include  plenty of vegetables, fruits, low-fat dairy products, and lean protein. ? Avoid eating foods that are high in solid fats, added sugars, or salt (sodium).  Get regular exercise. This is one of the most important things that you can do for your health. ? Try to exercise for at least 150 minutes each week. The type of exercise  that you do should increase your heart rate and make you sweat. This is known as moderate-intensity exercise. ? Try to do strengthening exercises at least twice each week. Do these in addition to the moderate-intensity exercise.  Know your numbers.Ask your health care provider to check your cholesterol and your blood glucose. Continue to have your blood tested as directed by your health care provider.  What should I know about cancer screening? There are several types of cancer. Take the following steps to reduce your risk and to catch any cancer development as early as possible. Breast Cancer  Practice breast self-awareness. ? This means understanding how your breasts normally appear and feel. ? It also means doing regular breast self-exams. Let your health care provider know about any changes, no matter how small.  If you are 38 or older, have a clinician do a breast exam (clinical breast exam or CBE) every year. Depending on your age, family history, and medical history, it may be recommended that you also have a yearly breast X-ray (mammogram).  If you have a family history of breast cancer, talk with your health care provider about genetic screening.  If you are at high risk for breast cancer, talk with your health care provider about having an MRI and a mammogram every year.  Breast cancer (BRCA) gene test is recommended for women who have family members with BRCA-related cancers. Results of the assessment will determine the need for genetic counseling and BRCA1 and for BRCA2 testing. BRCA-related cancers include these types: ? Breast. This occurs in males or females. ? Ovarian. ? Tubal. This may also be called fallopian tube cancer. ? Cancer of the abdominal or pelvic lining (peritoneal cancer). ? Prostate. ? Pancreatic.  Cervical, Uterine, and Ovarian Cancer Your health care provider may recommend that you be screened regularly for cancer of the pelvic organs. These include your  ovaries, uterus, and vagina. This screening involves a pelvic exam, which includes checking for microscopic changes to the surface of your cervix (Pap test).  For women ages 21-65, health care providers may recommend a pelvic exam and a Pap test every three years. For women ages 51-65, they may recommend the Pap test and pelvic exam, combined with testing for human papilloma virus (HPV), every five years. Some types of HPV increase your risk of cervical cancer. Testing for HPV may also be done on women of any age who have unclear Pap test results.  Other health care providers may not recommend any screening for nonpregnant women who are considered low risk for pelvic cancer and have no symptoms. Ask your health care provider if a screening pelvic exam is right for you.  If you have had past treatment for cervical cancer or a condition that could lead to cancer, you need Pap tests and screening for cancer for at least 20 years after your treatment. If Pap tests have been discontinued for you, your risk factors (such as having a new sexual partner) need to be reassessed to determine if you should start having screenings again. Some women have medical problems that increase the chance of getting cervical cancer. In these cases, your  health care provider may recommend that you have screening and Pap tests more often.  If you have a family history of uterine cancer or ovarian cancer, talk with your health care provider about genetic screening.  If you have vaginal bleeding after reaching menopause, tell your health care provider.  There are currently no reliable tests available to screen for ovarian cancer.  Lung Cancer Lung cancer screening is recommended for adults 59-70 years old who are at high risk for lung cancer because of a history of smoking. A yearly low-dose CT scan of the lungs is recommended if you:  Currently smoke.  Have a history of at least 30 pack-years of smoking and you currently  smoke or have quit within the past 15 years. A pack-year is smoking an average of one pack of cigarettes per day for one year.  Yearly screening should:  Continue until it has been 15 years since you quit.  Stop if you develop a health problem that would prevent you from having lung cancer treatment.  Colorectal Cancer  This type of cancer can be detected and can often be prevented.  Routine colorectal cancer screening usually begins at age 27 and continues through age 41.  If you have risk factors for colon cancer, your health care provider may recommend that you be screened at an earlier age.  If you have a family history of colorectal cancer, talk with your health care provider about genetic screening.  Your health care provider may also recommend using home test kits to check for hidden blood in your stool.  A small camera at the end of a tube can be used to examine your colon directly (sigmoidoscopy or colonoscopy). This is done to check for the earliest forms of colorectal cancer.  Direct examination of the colon should be repeated every 5-10 years until age 26. However, if early forms of precancerous polyps or small growths are found or if you have a family history or genetic risk for colorectal cancer, you may need to be screened more often.  Skin Cancer  Check your skin from head to toe regularly.  Monitor any moles. Be sure to tell your health care provider: ? About any new moles or changes in moles, especially if there is a change in a mole's shape or color. ? If you have a mole that is larger than the size of a pencil eraser.  If any of your family members has a history of skin cancer, especially at a young age, talk with your health care provider about genetic screening.  Always use sunscreen. Apply sunscreen liberally and repeatedly throughout the day.  Whenever you are outside, protect yourself by wearing long sleeves, pants, a wide-brimmed hat, and  sunglasses.  What should I know about osteoporosis? Osteoporosis is a condition in which bone destruction happens more quickly than new bone creation. After menopause, you may be at an increased risk for osteoporosis. To help prevent osteoporosis or the bone fractures that can happen because of osteoporosis, the following is recommended:  If you are 44-75 years old, get at least 1,000 mg of calcium and at least 600 mg of vitamin D per day.  If you are older than age 25 but younger than age 71, get at least 1,200 mg of calcium and at least 600 mg of vitamin D per day.  If you are older than age 62, get at least 1,200 mg of calcium and at least 800 mg of vitamin D per day.  Smoking and excessive alcohol intake increase the risk of osteoporosis. Eat foods that are rich in calcium and vitamin D, and do weight-bearing exercises several times each week as directed by your health care provider. What should I know about how menopause affects my mental health? Depression may occur at any age, but it is more common as you become older. Common symptoms of depression include:  Low or sad mood.  Changes in sleep patterns.  Changes in appetite or eating patterns.  Feeling an overall lack of motivation or enjoyment of activities that you previously enjoyed.  Frequent crying spells.  Talk with your health care provider if you think that you are experiencing depression. What should I know about immunizations? It is important that you get and maintain your immunizations. These include:  Tetanus, diphtheria, and pertussis (Tdap) booster vaccine.  Influenza every year before the flu season begins.  Pneumonia vaccine.  Shingles vaccine.  Your health care provider may also recommend other immunizations. This information is not intended to replace advice given to you by your health care provider. Make sure you discuss any questions you have with your health care provider. Document Released:  04/23/2005 Document Revised: 09/19/2015 Document Reviewed: 12/03/2014 Elsevier Interactive Patient Education  2018 Elsevier Inc.  

## 2017-10-21 ENCOUNTER — Encounter: Payer: Medicare Other | Admitting: Physical Medicine & Rehabilitation

## 2017-10-21 ENCOUNTER — Other Ambulatory Visit: Payer: Self-pay | Admitting: Medical

## 2017-10-24 ENCOUNTER — Telehealth: Payer: Self-pay | Admitting: Medical

## 2017-10-24 NOTE — Telephone Encounter (Addendum)
Called patient to schedule appointment for BP check. Also advised patient that she is only supposed to take Losartan 100 mg and not the Omesartan. Left message on patients answering machine for return call.

## 2017-10-24 NOTE — Telephone Encounter (Signed)
Last visit regarding htn, I had doucmented she had plenty of losartan. I don't see any prescription of almasartan given in past? Can you see who had approved. May she was given olmasartan in past during losartan recall period? Will you call pharmacy for clarification?

## 2017-10-25 NOTE — Telephone Encounter (Signed)
Patietn called back and scheduled appointment with E. Saguier PA-C and not a nurse visit. Will be in on 10/26/17.

## 2017-10-26 ENCOUNTER — Ambulatory Visit: Payer: Medicare Other | Admitting: Medical

## 2017-10-26 NOTE — Telephone Encounter (Signed)
She can check at home and does not need to come in presently. Advise can come in October when time to get flu vaccine. If she checks and bp are staying less than 140/90 does not need to come in presently.

## 2017-10-26 NOTE — Telephone Encounter (Signed)
Spoke with pt. Pt states she has been on Losartan for months now and her BP is fine. Pt states she was in last weeks and her BP was 110 60. Pt want to know if she has to come in next week. Please advise.

## 2017-10-31 ENCOUNTER — Encounter: Payer: Self-pay | Admitting: Medical

## 2017-10-31 ENCOUNTER — Ambulatory Visit (INDEPENDENT_AMBULATORY_CARE_PROVIDER_SITE_OTHER): Payer: Medicare Other | Admitting: Medical

## 2017-10-31 VITALS — BP 132/70 | HR 66 | Temp 97.7°F | Resp 16 | Ht 63.0 in | Wt 125.2 lb

## 2017-10-31 DIAGNOSIS — I10 Essential (primary) hypertension: Secondary | ICD-10-CM

## 2017-10-31 MED ORDER — LOSARTAN POTASSIUM 100 MG PO TABS: ORAL_TABLET | ORAL | 0 refills | Status: DC

## 2017-10-31 NOTE — Patient Instructions (Signed)
Your blood pressure is very well controlled today. After discussion I do agree with you that you may have not taken one of your clonidine on Saturday and had spike of your blood pressure that day.  Please be cautious and follow regimen clonidine 0.1 mg tid, amlodipine 5 mg twice daily and losartan 100 mg daily. Check bp daily next 4 days and confirm bp less than 140/90. If bp above then would need adjust med regimen. Give [place] update if bp over 140/90.  cmp today.  Follow up in 6 months or as needed  You mentioned that you don't get flu vaccines. Come October if you change you mind you can call and get scheduled for nurse visit flu vaccine.

## 2017-10-31 NOTE — Progress Notes (Signed)
Subjective:    Patient ID: Rose [name], female    DOB: [birth year], 77 y.o.   MRN: [id number]  HPI  Pt is on amlodipine 5 mg daily bid, catapress 0.1 mg tid and on losartan 100 mg q day.   Pt last readings. 162/84, 145/83, 167/83, 177/95, and 186/108.   These readings have been since Friday night. These readings were mostly on Saturday. Then she added  Clonidine and bp did decrease.   Pt took 4 clonidine yesterday total. She also took amlodipine 5 mg bid  and losartan 100 mg a day.  Pt speculates that her bp may have increased that one day due to missed clonidine dose on Saturday.(usually only takes tid but on Saturday she thinks took bid only)  Today she has taken one clonidine. She went ahead and took one losratan and one amlodipine. She will take second amlodipine and second clonidine later today.   Review of Systems  Constitutional: Negative for chills, fatigue and fever.  HENT: Negative for congestion and dental problem.   Respiratory: Negative for cough, chest tightness, shortness of breath and wheezing.   Cardiovascular: Negative for leg swelling.  Gastrointestinal: Negative for abdominal pain.  Genitourinary: Negative for decreased urine volume, dyspareunia, flank pain, frequency and hematuria.  Musculoskeletal: Negative for back pain.  Neurological: Negative for syncope, facial asymmetry and weakness.  Hematological: Negative for adenopathy. Does not bruise/bleed easily.  Psychiatric/Behavioral: Negative for behavioral problems and confusion.   Past Medical History:  Diagnosis Date  . Abnormal blood pressure    left arm from old brachial artery repair  . Anemia   . Anxiety   . Atherosclerosis of renal artery ([place])   . Attention deficit disorder without mention of hyperactivity   . Basal cell carcinoma of face   . Benign heart murmur   . Carotid artery disease ([place])   . Carotid bruit    Left  . Centrilobular emphysema ([place])   . Chronic bronchitis ([place])   .  Depression   . DJD (degenerative joint disease) of hip   . Esophageal reflux   . History of depression   . History of spinal fusion   . Hypertension   . Morton's neuroma    Hx of, Left  . Myalgia and myositis, unspecified   . Other tenosynovitis of hand and wrist   . Peripheral neuropathy   . Personal history of alcoholism ([place])   . Personal history of peptic ulcer disease   . Pulmonary nodule   . Rotator cuff disorder    pain  . Solitary cyst of breast   . Subclavian steal syndrome   . Unspecified hypothyroidism   . Urinary incontinence   . Wears glasses      Social History   Socioeconomic History  . Marital status: Single    Spouse name: Not on file  . Number of children: Y  . Years of education: Not on file  . Highest education level: Not on file  Occupational History  . Occupation: retired    Fish farm manager: RETIRED    CommentClinical cytogeneticist, had own firm  Social Needs  . Financial resource strain: Not on file  . Food insecurity:    Worry: Not on file    Inability: Not on file  . Transportation needs:    Medical: Not on file    Non-medical: Not on file  Tobacco Use  . Smoking status: Former Smoker    Packs/day: 1.00    Years: 25.00  Pack years: 25.00    Types: Cigarettes    Last attempt to quit: 03/16/1983    Years since quitting: 34.6  . Smokeless tobacco: Never Used  Substance and Sexual Activity  . Alcohol use: Yes    Comment: 2-3 glasses wine daily  . Drug use: No  . Sexual activity: Never    Comment: 1st intercourse 77 yo-Fewer than 5 partners  Lifestyle  . Physical activity:    Days per week: Not on file    Minutes per session: Not on file  . Stress: Not on file  Relationships  . Social connections:    Talks on phone: Not on file    Gets together: Not on file    Attends religious service: Not on file    Active member of club or organization: Not on file    Attends meetings of clubs or organizations: Not on file    Relationship status: Not on file  .  Intimate partner violence:    Fear of current or ex partner: Not on file    Emotionally abused: Not on file    Physically abused: Not on file    Forced sexual activity: Not on file  Other Topics Concern  . Not on file  Social History Narrative   [employer]-accounting      Married '60-52yr divorced; '84-2 years, divorced      3 daughters- '61, '70, '71; 2 sons-'53,'55 (schizophrenic-died); 10 grandchildren      Lives alone with 1 dog and 3 cats      Pt unsure of family history- was adopted.                   Past Surgical History:  Procedure Laterality Date  . APPENDECTOMY    . CARPOMETACARPEL SUSPENSION PLASTY Right 08/07/2013   Procedure: CARPOMETACARPEL [facility]) SUSPENSION PLASTY RIGHT THUMB;  Surgeon: Wynonia Sours, MD;  Location: [place];  Service: Orthopedics;  Laterality: Right;  . CARPOMETACARPEL SUSPENSION PLASTY Left 10/23/2015   Procedure: SUSPENSION PLASTY LEFT THUMB TRAPEZIUM EXCISION ABDUCTOR POLLICIS LONGUS TRANSFER;  Surgeon: Daryll Brod, MD;  Location: [place];  Service: Orthopedics;  Laterality: Left;  . CERVICAL FUSION  2007   Dr. Valli Glance approach  . JOINT REPLACEMENT Left   . Left brachial artery repair of pseudoaneurysm  2001   post a cath  . LUMBAR FUSION  09/2004   T12-L5 (Dr. Patrice [place])  . MINOR IRRIGATION AND DEBRIDEMENT OF WOUND Right 08/27/2014   Procedure: MINOR IRRIGATION AND DEBRIDEMENT OF WOUND;  Surgeon: Daryll Brod, MD;  Location: [place];  Service: Orthopedics;  Laterality: Right;  . ORIF TIBIA FRACTURE  2010   Left distal  . Percutanous Transluminal Angioplasty of Renal Arteris    . REPAIR EXTENSOR TENDON Right 09/26/2014   Procedure: REPAIR EXTENSOR TENDON RIGHT RING FINGER ;  Surgeon: Daryll Brod, MD;  Location: [place];  Service: Orthopedics;  Laterality: Right;  . SHOULDER ARTHROSCOPY  09/2008   Left  . TONSILLECTOMY AND ADENOIDECTOMY    . TOTAL HIP  ARTHROPLASTY  2008   left  . WISDOM TOOTH EXTRACTION      Family History  Adopted: Yes  Problem Relation Age of Onset  . Schizophrenia Son        Deceased 35  . Hypertension Daughter        Renal  . Healthy Son        x1  . Healthy Daughter  x2    Allergies  Allergen Reactions  . Amoxicillin     REACTION: Rash  . Codeine Hives  . Hydrocodone-Acetaminophen     REACTION: itching  . Hydromorphone Hcl   . Morphine And Related Hives and Itching  . Morphine Sulfate     REACTION: unspecified  . Nsaids     Ulcer    Current Outpatient Medications on File Prior to Visit  Medication Sig Dispense Refill  . alendronate (FOSAMAX) 70 MG tablet Take 1 tablet (70 mg total) by mouth every 7 (seven) days. Take with a full glass of water on an empty stomach. 4 tablet 11  . amLODipine (NORVASC) 5 MG tablet TAKE ONE TABLET BY MOUTH TWICE A DAY 180 tablet 0  . aspirin 81 MG tablet Take 81 mg by mouth daily.     Marland Kitchen atomoxetine (STRATTERA) 100 MG capsule Take 100 mg by mouth daily.    . BuPROPion HCl (WELLBUTRIN PO) Take by mouth.    . cloNIDine (CATAPRES) 0.1 MG tablet Take 1 tablet (0.1 mg total) by mouth 3 (three) times daily. 90 tablet 1  . DULoxetine (CYMBALTA) 60 MG capsule TAKE ONE CAPSULE BY MOUTH DAILY 30 capsule 5  . ferrous sulfate dried (SLOW FE) 160 (50 FE) MG TBCR Take 160 mg by mouth 2 (two) times daily. Patient has been taking Once Daily    . Glucosamine 500 MG TABS Take 1 tablet by mouth 2 (two) times daily. Glucosamine-Chondronton    . levothyroxine (SYNTHROID, LEVOTHROID) 112 MCG tablet TAKE ONE TABLET BY MOUTH DAILY BEFORE BREAKFAST 90 tablet 1  . losartan (COZAAR) 100 MG tablet TAKE ONE TABLET BY MOUTH DAILY - NEED OFFICE VISIT FOR FURTHER REFILLS 30 tablet 0  . OLANZapine (ZYPREXA) 5 MG tablet Take 15 mg by mouth at bedtime.     Marland Kitchen oxybutynin (DITROPAN-XL) 10 MG 24 hr tablet Take 1 tablet (10 mg total) by mouth at bedtime. 30 tablet 11   No current  facility-administered medications on file prior to visit.     BP 132/70   Pulse 66   Temp 97.7 F (36.5 C) (Oral)   Resp 16   Ht 5\' 3"  (1.6 m)   Wt 125 lb 3.2 oz (56.8 kg)   SpO2 97%   BMI 22.18 kg/m       Objective:   Physical Exam   General Mental Status- Alert. General Appearance- Not in acute distress.   Skin General: Color- Normal Color. Moisture- Normal Moisture.  Neck Carotid Arteries- Normal color. Moisture- Normal Moisture. No carotid bruits. No JVD.  Chest and Lung Exam Auscultation: Breath Sounds:-Normal.  Cardiovascular Auscultation:Rythm- Regular. Murmurs & Other Heart Sounds:Auscultation of the heart reveals- No Murmurs.  Abdomen Inspection:-Inspeection Normal. Palpation/Percussion:Note:No mass. Palpation and Percussion of the abdomen reveal- Non Tender, Non Distended + BS, no rebound or guarding.    Neurologic Cranial Nerve exam:- CN III-XII intact(No nystagmus), symmetric smile. Strength:- 5/5 equal and symmetric strength both upper and lower extremities.     Assessment & Plan:  Your blood pressure is very well controlled today. After discussion I do agree with you that you may have not taken one of your clonidine on Saturday and had spike of your blood pressure that day.  Please be cautious and follow regimen clonidine 0.1 mg tid, amlodipine 5 mg twice daily and losartan 100 mg daily. Check bp daily next 4 days and confirm bp less than 140/90. If bp above then would need adjust med regimen. Give [place] update  if bp over 140/90.  cmp today.  Follow up in 6 months or as needed  [employer], [employer]

## 2017-11-01 LAB — COMPREHENSIVE METABOLIC PANEL
ALT: 22 U/L (ref 0–35)
AST: 12 U/L (ref 0–37)
Albumin: 4.3 g/dL (ref 3.5–5.2)
Alkaline Phosphatase: 68 U/L (ref 39–117)
BUN: 20 mg/dL (ref 6–23)
CHLORIDE: 104 meq/L (ref 96–112)
CO2: 27 mEq/L (ref 19–32)
Calcium: 10.3 mg/dL (ref 8.4–10.5)
Creatinine, Ser: 1 mg/dL (ref 0.40–1.20)
GFR: 56.84 mL/min — AB (ref >60.00)
Glucose, Bld: 98 mg/dL (ref 70–99)
POTASSIUM: 4.4 meq/L (ref 3.5–5.1)
Sodium: 140 mEq/L (ref 135–145)
Total Bilirubin: 0.1 mg/dL — ABNORMAL LOW (ref 0.2–1.2)
Total Protein: 7.2 g/dL (ref 6.0–8.3)

## 2017-11-10 ENCOUNTER — Telehealth: Payer: Self-pay

## 2017-11-10 NOTE — Telephone Encounter (Signed)
Copied from [place] [id number]. Topic: Quick Communication - Office Called Patient >> Nov 10, 2017  2:12 PM Cecelia Byars, [place] wrote: Reason for KIC:[id number] says she was called by the office and no message was left and would like a return call

## 2017-11-12 ENCOUNTER — Other Ambulatory Visit: Payer: Self-pay | Admitting: Medical

## 2017-11-28 ENCOUNTER — Other Ambulatory Visit: Payer: Self-pay | Admitting: Medical

## 2017-12-07 ENCOUNTER — Ambulatory Visit: Payer: Medicare Other | Admitting: Medical

## 2017-12-07 DIAGNOSIS — Z0289 Encounter for other administrative examinations: Secondary | ICD-10-CM

## 2017-12-12 ENCOUNTER — Ambulatory Visit (INDEPENDENT_AMBULATORY_CARE_PROVIDER_SITE_OTHER): Payer: Medicare Other | Admitting: Medical

## 2017-12-12 ENCOUNTER — Encounter: Payer: Self-pay | Admitting: Medical

## 2017-12-12 VITALS — BP 120/60 | HR 88 | Temp 97.6°F | Resp 16 | Ht 63.0 in | Wt 123.0 lb

## 2017-12-12 DIAGNOSIS — K5900 Constipation, unspecified: Secondary | ICD-10-CM

## 2017-12-12 DIAGNOSIS — I10 Essential (primary) hypertension: Secondary | ICD-10-CM | POA: Diagnosis not present

## 2017-12-12 DIAGNOSIS — G4700 Insomnia, unspecified: Secondary | ICD-10-CM

## 2017-12-12 MED ORDER — ZOLPIDEM TARTRATE 5 MG PO TABS: 5.0000 mg | ORAL_TABLET | Freq: Every evening | ORAL | 0 refills | Status: DC | PRN

## 2017-12-12 MED ORDER — HYDROCHLOROTHIAZIDE 12.5 MG PO CAPS: ORAL_CAPSULE | ORAL | 0 refills | Status: DC

## 2017-12-12 NOTE — Patient Instructions (Addendum)
You have had some recent constipation over the past month.  Want you to concentrate on staying well-hydrated and getting some daily exercise.  Also eat healthy.  Would recommend that if you do not have a bowel movement by 3 days then you can use MiraLAX.  If on the fourth day no bowel movement then let me know and we could get one view abdomen x-ray.  If this constipation becomes worse then might consider referral to GI specialist.  For recent mild spike of blood pressure levels for the past 3 days but good blood pressure levels now, I want you to continue current regimen of amlodipine, losartan and clonidine.  Check blood pressures at least once a day for the next week and check again if you feel periodically symptomatic.  If your blood pressure spike above 140/90 as you explained over the last 3 days then add low-dose HCTZ 12.5 mg tablet.  Please give me an update in 7 days regarding your blood pressure levels and how often you had to use HCTZ.  For insomnia gave low dose ambien. Use sparingly as directed.  Flu vaccine declined today.  Follow-up in 2 to 3 weeks or as needed.

## 2017-12-12 NOTE — Progress Notes (Signed)
Subjective:    Patient ID: Rose [name], female    DOB: 06/14/1940, 77 y.o.   MRN: [id number]  HPI  Pt in for some constipation recently over past month.    Before past month she has not been having any problems. Pt state in past her pattern was likely every other day. Now at times will go 5 days at time with no bm. Pt not exercising much but decided to start water aerobics. Pt also states eating one big meal a day. She does notes states most recently took miralax 3-4 days ago and then had a lot of bowel movements. So much she states almost lost control of movements. Now 2 days since bm.   Hx of appendectomy at 77 yr old. But otherwise no abdomen surgery.  Pt states history of colonoscopy done in past. Last one was done 77 yr old and she states does not want to have other one  Pt states yesterday afternoon her bp was 153/83. She checked since she could hear heart beat in her ear(for 2-3 days). But no gross motor/sensory function deficits. She only checked bp one time.  History of insomnia for years was on [place] medication 10 years ago. She was only on 5 mg ambien. Recently started to use old rx a couple of weeks ago.     Review of Systems  Constitutional: Negative for chills, fatigue and fever.  Respiratory: Negative for cough, chest tightness, wheezing and stridor.   Cardiovascular: Negative for chest pain and palpitations.  Gastrointestinal: Positive for constipation. Negative for abdominal pain, diarrhea and nausea.  Genitourinary: Negative for dyspareunia, enuresis, frequency, menstrual problem and urgency.  Musculoskeletal: Negative for back pain.  Neurological: Negative for dizziness, speech difficulty, weakness, numbness and headaches.  Hematological: Negative for adenopathy. Does not bruise/bleed easily.  Psychiatric/Behavioral: Positive for sleep disturbance. Negative for behavioral problems, confusion and suicidal ideas. The patient is not hyperactive.     Past  Medical History:  Diagnosis Date  . Abnormal blood pressure    left arm from old brachial artery repair  . Anemia   . Anxiety   . Atherosclerosis of renal artery ([place])   . Attention deficit disorder without mention of hyperactivity   . Basal cell carcinoma of face   . Benign heart murmur   . Carotid artery disease ([place])   . Carotid bruit    Left  . Centrilobular emphysema ([place])   . Chronic bronchitis ([place])   . Depression   . DJD (degenerative joint disease) of hip   . Esophageal reflux   . History of depression   . History of spinal fusion   . Hypertension   . Morton's neuroma    Hx of, Left  . Myalgia and myositis, unspecified   . Other tenosynovitis of hand and wrist   . Peripheral neuropathy   . Personal history of alcoholism ([place])   . Personal history of peptic ulcer disease   . Pulmonary nodule   . Rotator cuff disorder    pain  . Solitary cyst of breast   . Subclavian steal syndrome   . Unspecified hypothyroidism   . Urinary incontinence   . Wears glasses      Social History   Socioeconomic History  . Marital status: Single    Spouse name: Not on file  . Number of children: Y  . Years of education: Not on file  . Highest education level: Not on file  Occupational History  . Occupation: retired  Employer: RETIRED    CommentClinical cytogeneticist, had own firm  Social Needs  . Financial resource strain: Not on file  . Food insecurity:    Worry: Not on file    Inability: Not on file  . Transportation needs:    Medical: Not on file    Non-medical: Not on file  Tobacco Use  . Smoking status: Former Smoker    Packs/day: 1.00    Years: 25.00    Pack years: 25.00    Types: Cigarettes    Last attempt to quit: 03/16/1983    Years since quitting: 34.7  . Smokeless tobacco: Never Used  Substance and Sexual Activity  . Alcohol use: Yes    Comment: 2-3 glasses wine daily  . Drug use: No  . Sexual activity: Never    Comment: 1st intercourse 77 yo-Fewer than 5 partners    Lifestyle  . Physical activity:    Days per week: Not on file    Minutes per session: Not on file  . Stress: Not on file  Relationships  . Social connections:    Talks on phone: Not on file    Gets together: Not on file    Attends religious service: Not on file    Active member of club or organization: Not on file    Attends meetings of clubs or organizations: Not on file    Relationship status: Not on file  . Intimate partner violence:    Fear of current or ex partner: Not on file    Emotionally abused: Not on file    Physically abused: Not on file    Forced sexual activity: Not on file  Other Topics Concern  . 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                   Past Surgical History:  Procedure Laterality Date  . APPENDECTOMY    . CARPOMETACARPEL SUSPENSION PLASTY Right 08/07/2013   Procedure: CARPOMETACARPEL [facility]) SUSPENSION PLASTY RIGHT THUMB;  Surgeon: Wynonia Sours, MD;  Location: [place];  Service: Orthopedics;  Laterality: Right;  . CARPOMETACARPEL SUSPENSION PLASTY Left 10/23/2015   Procedure: SUSPENSION PLASTY LEFT THUMB TRAPEZIUM EXCISION ABDUCTOR POLLICIS LONGUS TRANSFER;  Surgeon: Daryll Brod, MD;  Location: [place];  Service: Orthopedics;  Laterality: Left;  . CERVICAL FUSION  2007   Dr. Valli Glance approach  . JOINT REPLACEMENT Left   . Left brachial artery repair of pseudoaneurysm  2001   post a cath  . LUMBAR FUSION  09/2004   T12-L5 (Dr. Patrice Paradise)  . MINOR IRRIGATION AND DEBRIDEMENT OF WOUND Right 08/27/2014   Procedure: MINOR IRRIGATION AND DEBRIDEMENT OF WOUND;  Surgeon: Daryll Brod, MD;  Location: [place];  Service: Orthopedics;   Laterality: Right;  . ORIF TIBIA FRACTURE  2010   Left distal  . Percutanous Transluminal Angioplasty of Renal Arteris    . REPAIR EXTENSOR TENDON Right 09/26/2014   Procedure: REPAIR EXTENSOR TENDON RIGHT RING FINGER ;  Surgeon: Daryll Brod, MD;  Location: [place];  Service: Orthopedics;  Laterality: Right;  . SHOULDER ARTHROSCOPY  09/2008   Left  .  TONSILLECTOMY AND ADENOIDECTOMY    . TOTAL HIP ARTHROPLASTY  2008   left  . WISDOM TOOTH EXTRACTION      Family History  Adopted: Yes  Problem Relation Age of Onset  . Schizophrenia Son        Deceased 92  . Hypertension Daughter        Renal  . Healthy Son        x1  . Healthy Daughter        x2    Allergies  Allergen Reactions  . Amoxicillin     REACTION: Rash  . Codeine Hives  . Hydrocodone-Acetaminophen     REACTION: itching  . Hydromorphone Hcl   . Morphine And Related Hives and Itching  . Morphine Sulfate     REACTION: unspecified  . Nsaids     Ulcer    Current Outpatient Medications on File Prior to Visit  Medication Sig Dispense Refill  . alendronate (FOSAMAX) 70 MG tablet Take 1 tablet (70 mg total) by mouth every 7 (seven) days. Take with a full glass of water on an empty stomach. 4 tablet 11  . amLODipine (NORVASC) 5 MG tablet TAKE ONE TABLET BY MOUTH TWICE A DAY 180 tablet 0  . aspirin 81 MG tablet Take 81 mg by mouth daily.     Marland Kitchen atomoxetine (STRATTERA) 100 MG capsule Take 100 mg by mouth daily.    . BuPROPion HCl (WELLBUTRIN PO) Take by mouth.    . cloNIDine (CATAPRES) 0.1 MG tablet TAKE ONE TABLET BY MOUTH THREE TIMES A DAY 90 tablet 0  . DULoxetine (CYMBALTA) 60 MG capsule TAKE ONE CAPSULE BY MOUTH DAILY 30 capsule 5  . ferrous sulfate dried (SLOW FE) 160 (50 FE) MG TBCR Take 160 mg by mouth 2 (two) times daily. Patient has been taking Once Daily    . Glucosamine 500 MG TABS Take 1 tablet by mouth 2 (two) times daily. Glucosamine-Chondronton    . levothyroxine (SYNTHROID, LEVOTHROID)  112 MCG tablet TAKE ONE TABLET BY MOUTH DAILY BEFORE BREAKFAST 90 tablet 0  . losartan (COZAAR) 100 MG tablet TAKE ONE TABLET BY MOUTH DAILY 90 tablet 0  . OLANZapine (ZYPREXA) 5 MG tablet Take 15 mg by mouth at bedtime.     Marland Kitchen oxybutynin (DITROPAN-XL) 10 MG 24 hr tablet Take 1 tablet (10 mg total) by mouth at bedtime. 30 tablet 11   No current facility-administered medications on file prior to visit.     BP 120/60 Comment: checked it twice and was 120/60  Pulse 88   Temp 97.6 F (36.4 C) (Oral)   Resp 16   Ht 5\' 3"  (1.6 m)   Wt 123 lb (55.8 kg)   SpO2 98%   BMI 21.79 kg/m       Objective:   Physical Exam  General Mental Status- Alert. General Appearance- Not in acute distress.   Skin General: Color- Normal Color. Moisture- Normal Moisture.   Chest and Lung Exam Auscultation: Breath Sounds:-Normal.  Cardiovascular Auscultation:Rythm- Regular. Murmurs & Other Heart Sounds:Auscultation of the heart reveals- No Murmurs.  Abdomen Inspection:-Inspeection Normal. Palpation/Percussion:Note:No mass. Palpation and Percussion of the abdomen reveal- Non Tender, Non Distended + BS, no rebound or guarding.    Neurologic Cranial Nerve exam:- CN III-XII intact(No nystagmus), symmetric smile. Strength:- 5/5 equal and symmetric strength both upper and lower extremities.      Assessment & Plan:  You have had some recent constipation over the past month.  Want you to concentrate on  staying well-hydrated and getting some daily exercise.  Also eat healthy.  Would recommend that if you do not have a bowel movement by 3 days then you can use MiraLAX.  If on the fourth day no bowel movement then let me know and we could get one view abdomen x-ray.  If this constipation becomes worse then might consider referral to GI specialist.  For recent mild spike of blood pressure levels for the past 3 days but good blood pressure levels now, I want you to continue current regimen of amlodipine,  losartan and clonidine.  Check blood pressures at least once a day for the next week and check again if you feel periodically symptomatic.  If your blood pressure spike above 140/90 as you explained over the last 3 days then add low-dose HCTZ 12.5 mg tablet.  Please give me an update in 7 days regarding your blood pressure levels and how often you had to use HCTZ.  For insomnia gave low dose ambien. Use sparingly as directed.  Flu vaccine declined today.  Follow-up in 2 to 3 weeks or as needed.

## 2017-12-13 ENCOUNTER — Other Ambulatory Visit: Payer: Self-pay | Admitting: Medical

## 2017-12-19 ENCOUNTER — Other Ambulatory Visit: Payer: Self-pay | Admitting: Medical

## 2017-12-19 ENCOUNTER — Encounter: Payer: Self-pay | Admitting: Medical

## 2017-12-29 ENCOUNTER — Telehealth: Payer: Self-pay | Admitting: *Deleted

## 2017-12-29 ENCOUNTER — Encounter: Payer: Self-pay | Admitting: Medical

## 2017-12-30 ENCOUNTER — Encounter: Payer: Self-pay | Admitting: Medical

## 2018-01-17 ENCOUNTER — Encounter: Payer: Self-pay | Admitting: Medical

## 2018-01-17 ENCOUNTER — Other Ambulatory Visit: Payer: Self-pay | Admitting: Medical

## 2018-01-18 ENCOUNTER — Encounter: Payer: Self-pay | Admitting: Medical

## 2018-01-18 ENCOUNTER — Ambulatory Visit (INDEPENDENT_AMBULATORY_CARE_PROVIDER_SITE_OTHER): Payer: Medicare Other | Admitting: Medical

## 2018-01-18 VITALS — BP 137/55 | HR 73 | Temp 97.9°F | Resp 16 | Ht 63.0 in | Wt 120.4 lb

## 2018-01-18 DIAGNOSIS — R195 Other fecal abnormalities: Secondary | ICD-10-CM

## 2018-01-18 NOTE — Progress Notes (Signed)
Subjective:    Patient ID: Rose [name], female    DOB: September 12, 1940, 77 y.o.   MRN: [id number]  HPI  Pt in for follow up.  Pt states she took colace the friday and she states had a large bowel movement(She had 3 days of constipation before she took colace). Since Saturday she had about 4 loose stools. Urge to defecate comes on very quickly. Almost so fast that she could not make it to the bathroom. Since then loose stools have tapered. 2 loose stools yesterday. No bowel movement today. No longer has severe urge to defecate.  Pt has had some constipation in past. She feels like she can't be constipated now. She states looses tools can't represent leakiness.     Review of Systems  Constitutional: Negative for chills, fatigue and fever.  Respiratory: Negative for cough, chest tightness, shortness of breath and wheezing.   Cardiovascular: Negative for chest pain and palpitations.  Gastrointestinal: Negative for abdominal pain and nausea.       See hpi.  Genitourinary: Negative for difficulty urinating, enuresis and flank pain.  Musculoskeletal: Negative for back pain, gait problem, neck pain and neck stiffness.  Skin: Negative for rash.  Neurological: Negative for dizziness, speech difficulty, weakness, numbness and headaches.  Hematological: Negative for adenopathy. Does not bruise/bleed easily.  Psychiatric/Behavioral: Negative for behavioral problems, confusion, sleep disturbance and suicidal ideas. The patient is not nervous/anxious.      Past Medical History:  Diagnosis Date  . Abnormal blood pressure    left arm from old brachial artery repair  . Anemia   . Anxiety   . Atherosclerosis of renal artery ([place])   . Attention deficit disorder without mention of hyperactivity   . Basal cell carcinoma of face   . Benign heart murmur   . Carotid artery disease ([place])   . Carotid bruit    Left  . Centrilobular emphysema ([place])   . Chronic bronchitis ([place])   . Depression   .  DJD (degenerative joint disease) of hip   . Esophageal reflux   . History of depression   . History of spinal fusion   . Hypertension   . Morton's neuroma    Hx of, Left  . Myalgia and myositis, unspecified   . Other tenosynovitis of hand and wrist   . Peripheral neuropathy   . Personal history of alcoholism ([place])   . Personal history of peptic ulcer disease   . Pulmonary nodule   . Rotator cuff disorder    pain  . Solitary cyst of breast   . Subclavian steal syndrome   . Unspecified hypothyroidism   . Urinary incontinence   . Wears glasses      Social History   Socioeconomic History  . Marital status: Single    Spouse name: Not on file  . Number of children: Y  . Years of education: Not on file  . Highest education level: Not on file  Occupational History  . Occupation: retired    Fish farm manager: RETIRED    CommentClinical cytogeneticist, had own firm  Social Needs  . Financial resource strain: Not on file  . Food insecurity:    Worry: Not on file    Inability: Not on file  . Transportation needs:    Medical: Not on file    Non-medical: Not on file  Tobacco Use  . Smoking status: Former Smoker    Packs/day: 1.00    Years: 25.00    Pack years: 25.00  Types: Cigarettes    Last attempt to quit: 03/16/1983    Years since quitting: 34.8  . Smokeless tobacco: Never Used  Substance and Sexual Activity  . Alcohol use: Yes    Comment: 2-3 glasses wine daily  . Drug use: No  . Sexual activity: Never    Comment: 1st intercourse 77 yo-Fewer than 5 partners  Lifestyle  . Physical activity:    Days per week: Not on file    Minutes per session: Not on file  . Stress: Not on file  Relationships  . Social connections:    Talks on phone: Not on file    Gets together: Not on file    Attends religious service: Not on file    Active member of club or organization: Not on file    Attends meetings of clubs or organizations: Not on file    Relationship status: Not on file  . Intimate partner  violence:    Fear of current or ex partner: Not on file    Emotionally abused: Not on file    Physically abused: Not on file    Forced sexual activity: Not on file  Other Topics Concern  . 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                   Past Surgical History:  Procedure Laterality Date  . APPENDECTOMY    . CARPOMETACARPEL SUSPENSION PLASTY Right 08/07/2013   Procedure: CARPOMETACARPEL [facility]) SUSPENSION PLASTY RIGHT THUMB;  Surgeon: Wynonia Sours, MD;  Location: [place];  Service: Orthopedics;  Laterality: Right;  . CARPOMETACARPEL SUSPENSION PLASTY Left 10/23/2015   Procedure: SUSPENSION PLASTY LEFT THUMB TRAPEZIUM EXCISION ABDUCTOR POLLICIS LONGUS TRANSFER;  Surgeon: Daryll Brod, MD;  Location: [place];  Service: Orthopedics;  Laterality: Left;  . CERVICAL FUSION  2007   Dr. Valli Glance approach  . JOINT REPLACEMENT Left   . Left brachial artery repair of pseudoaneurysm  2001   post a cath  . LUMBAR FUSION  09/2004   T12-L5 (Dr. Patrice Paradise)  . MINOR IRRIGATION AND DEBRIDEMENT OF WOUND Right 08/27/2014   Procedure: MINOR IRRIGATION AND DEBRIDEMENT OF WOUND;  Surgeon: Daryll Brod, MD;  Location: [place];  Service: Orthopedics;  Laterality: Right;  . ORIF TIBIA FRACTURE  2010   Left distal  . Percutanous Transluminal Angioplasty of Renal Arteris    . REPAIR EXTENSOR TENDON Right 09/26/2014   Procedure: REPAIR EXTENSOR TENDON RIGHT RING FINGER ;  Surgeon: Daryll Brod, MD;  Location: [place];  Service: Orthopedics;  Laterality: Right;  . SHOULDER ARTHROSCOPY  09/2008   Left  . TONSILLECTOMY AND ADENOIDECTOMY    . TOTAL HIP ARTHROPLASTY  2008   left    . WISDOM TOOTH EXTRACTION      Family History  Adopted: Yes  Problem Relation Age of Onset  . Schizophrenia Son        Deceased 38  . Hypertension Daughter        Renal  . Healthy Son        x1  . Healthy Daughter        x2  Allergies  Allergen Reactions  . Amoxicillin     REACTION: Rash  . Codeine Hives  . Hydrocodone-Acetaminophen     REACTION: itching  . Hydromorphone Hcl   . Morphine And Related Hives and Itching  . Morphine Sulfate     REACTION: unspecified  . Nsaids     Ulcer    Current Outpatient Medications on File Prior to Visit  Medication Sig Dispense Refill  . alendronate (FOSAMAX) 70 MG tablet Take 1 tablet (70 mg total) by mouth every 7 (seven) days. Take with a full glass of water on an empty stomach. 4 tablet 11  . amLODipine (NORVASC) 5 MG tablet TAKE ONE TABLET BY MOUTH TWICE A DAY 180 tablet 0  . aspirin 81 MG tablet Take 81 mg by mouth daily.     Marland Kitchen atomoxetine (STRATTERA) 100 MG capsule Take 100 mg by mouth daily.    . BuPROPion HCl (WELLBUTRIN PO) Take by mouth.    . cloNIDine (CATAPRES) 0.1 MG tablet Take 1 tablet (0.1 mg total) by mouth 3 (three) times daily. 270 tablet 3  . DULoxetine (CYMBALTA) 60 MG capsule TAKE ONE CAPSULE BY MOUTH DAILY 30 capsule 5  . ferrous sulfate dried (SLOW FE) 160 (50 FE) MG TBCR Take 160 mg by mouth 2 (two) times daily. Patient has been taking Once Daily    . Glucosamine 500 MG TABS Take 1 tablet by mouth 2 (two) times daily. Glucosamine-Chondronton    . hydrochlorothiazide (MICROZIDE) 12.5 MG capsule TAKE ONE CAPSULE BY MOUTH DAILY AS NEEDED 10 capsule 0  . levothyroxine (SYNTHROID, LEVOTHROID) 112 MCG tablet TAKE ONE TABLET BY MOUTH DAILY BEFORE BREAKFAST 90 tablet 0  . losartan (COZAAR) 100 MG tablet TAKE ONE TABLET BY MOUTH DAILY 90 tablet 0  . OLANZapine (ZYPREXA) 5 MG tablet Take 15 mg by mouth at bedtime.     Marland Kitchen oxybutynin (DITROPAN-XL) 10 MG 24 hr tablet Take 1 tablet (10 mg total) by mouth at bedtime. 30  tablet 11  . zolpidem (AMBIEN) 5 MG tablet Take 1 tablet (5 mg total) by mouth at bedtime as needed for sleep. 14 tablet 0   No current facility-administered medications on file prior to visit.     BP (!) 137/55   Pulse 73   Temp 97.9 F (36.6 C) (Oral)   Resp 16   Ht 5\' 3"  (1.6 m)   Wt 120 lb 6.4 oz (54.6 kg)   SpO2 98%   BMI 21.33 kg/m       Objective:   Physical Exam  General Mental Status- Alert. General Appearance- Not in acute distress.   Skin General: Color- Normal Color. Moisture- Normal Moisture.  Neck Carotid Arteries- Normal color. Moisture- Normal Moisture. No carotid bruits. No JVD.  Chest and Lung Exam Auscultation: Breath Sounds:-Normal.  Cardiovascular Auscultation:Rythm- Regular. Murmurs & Other Heart Sounds:Auscultation of the heart reveals- No Murmurs.  Abdomen Inspection:-Inspeection Normal. Palpation/Percussion:Note:No mass. Palpation and Percussion of the abdomen reveal- Non Tender, Non Distended + BS, no rebound or guarding.   Neurologic Cranial Nerve exam:- CN III-XII intact(No nystagmus), symmetric smile. Strength:- 5/5 equal and symmetric strength both upper and lower extremities.      Assessment & Plan:  You have had some recent constipation over the past 2 months but then over the last 4 days loose stools.  The symptoms have almost resolved.  I do not think it is worthwhile for you to take antidiarrheal agent.  However you could use Metamucil 1 rounded tablespoon in  8 ounces of water 3 times a day for for the next 2 days only.  Then would resume your normal preventive measures for your recent chronic intermittent constipation.  If you do have more than 3 days of constipation and do not respond to Colace then let me know and and in that event would recommend one view abdomen x-ray.  If you were to be to get recurrent loose stools as you described over the weekend and let me know as well.  In that event would have you do a stool panel  studies.  Otherwise follow-up in 1 month or as needed.

## 2018-01-18 NOTE — Patient Instructions (Addendum)
You have had some recent constipation over the past 2 months but then over the last 4 days loose stools.  The symptoms have almost resolved.  I do not think it is worthwhile for you to take antidiarrheal agent.  However you could use Metamucil 1 rounded tablespoon in 8 ounces of water 3 times a day for for the next 2 days only.  Then would resume your normal preventive measures for your recent chronic intermittent constipation.  If you do have more than 3 days of constipation and do not respond to Colace then let me know and and in that event would recommend one view abdomen x-ray.  If you were to be to get recurrent loose stools as you described over the weekend and let me know as well.  In that event would have you do a stool panel studies.  Otherwise follow-up in 1 month or as needed.

## 2018-01-30 ENCOUNTER — Encounter: Payer: Self-pay | Admitting: Medical

## 2018-01-31 NOTE — Telephone Encounter (Signed)
Copied from [facility] [id number]. Topic: Quick Communication - See Telephone Encounter >> Jan 31, 2018  4:50 PM Vernona Rieger wrote: CRM for notification. See Telephone encounter for: 01/31/18.  Patient states she rather not take magnesium citrate, she said she does not need that. She wants to reserve her problem not make it worse. Please advise. She is locked out of her mychart. Please call her at [phone]

## 2018-02-01 ENCOUNTER — Encounter: Payer: Self-pay | Admitting: Medical

## 2018-02-02 ENCOUNTER — Encounter: Payer: Self-pay | Admitting: Medical

## 2018-02-02 ENCOUNTER — Ambulatory Visit (INDEPENDENT_AMBULATORY_CARE_PROVIDER_SITE_OTHER): Payer: Medicare Other | Admitting: Medical

## 2018-02-02 VITALS — BP 152/59 | HR 86 | Temp 97.8°F | Resp 16 | Ht 63.0 in | Wt 120.6 lb

## 2018-02-02 DIAGNOSIS — R197 Diarrhea, unspecified: Secondary | ICD-10-CM | POA: Diagnosis not present

## 2018-02-02 NOTE — Progress Notes (Signed)
Subjective:    Patient ID: Rose [name], female    DOB: 1940/07/03, 77 y.o.   MRN: [id number]  HPI  Pt states has  recent varying constipation and some loose stools in past sicne September. See those notes.   I had wanted her to just use metamucil and see if she would have formed. She used it 1 tablespoon tid for 2 days then stopped. Since then she was having some random diarrhea that had very quit onset. She states sometimes would have loose stool even in her sleep. This has been the case for past 10-11 days. She describes one sever bout of one water diarrhea event per day.  In recent past when she had more constipation  pattern I wanted to get 1 view abdomen. I had mentioned that in past and she declined. Today she still wants to wait.    Pt does not remember her bm pattern in past she thinks it was every other day.  Review of Systems  Constitutional: Negative for chills, fatigue and fever.  Respiratory: Negative for cough, chest tightness, shortness of breath and wheezing.   Cardiovascular: Negative for chest pain and palpitations.  Gastrointestinal: Positive for constipation and diarrhea. Negative for abdominal pain, blood in stool, nausea and vomiting.  Musculoskeletal: Negative for back pain.  Neurological: Negative for dizziness, syncope, weakness, light-headedness and headaches.  Hematological: Negative for adenopathy. Does not bruise/bleed easily.  Psychiatric/Behavioral: Negative for behavioral problems, confusion and sleep disturbance. The patient is not nervous/anxious.     Past Medical History:  Diagnosis Date  . Abnormal blood pressure    left arm from old brachial artery repair  . Anemia   . Anxiety   . Atherosclerosis of renal artery ([place])   . Attention deficit disorder without mention of hyperactivity   . Basal cell carcinoma of face   . Benign heart murmur   . Carotid artery disease ([place])   . Carotid bruit    Left  . Centrilobular emphysema ([place])   .  Chronic bronchitis ([place])   . Depression   . DJD (degenerative joint disease) of hip   . Esophageal reflux   . History of depression   . History of spinal fusion   . Hypertension   . Morton's neuroma    Hx of, Left  . Myalgia and myositis, unspecified   . Other tenosynovitis of hand and wrist   . Peripheral neuropathy   . Personal history of alcoholism ([place])   . Personal history of peptic ulcer disease   . Pulmonary nodule   . Rotator cuff disorder    pain  . Solitary cyst of breast   . Subclavian steal syndrome   . Unspecified hypothyroidism   . Urinary incontinence   . Wears glasses      Social History   Socioeconomic History  . Marital status: Single    Spouse name: Not on file  . Number of children: Y  . Years of education: Not on file  . Highest education level: Not on file  Occupational History  . Occupation: retired    Fish farm manager: RETIRED    CommentClinical cytogeneticist, had own firm  Social Needs  . Financial resource strain: Not on file  . Food insecurity:    Worry: Not on file    Inability: Not on file  . Transportation needs:    Medical: Not on file    Non-medical: Not on file  Tobacco Use  . Smoking status: Former Smoker  Packs/day: 1.00    Years: 25.00    Pack years: 25.00    Types: Cigarettes    Last attempt to quit: 03/16/1983    Years since quitting: 34.9  . Smokeless tobacco: Never Used  Substance and Sexual Activity  . Alcohol use: Yes    Comment: 2-3 glasses wine daily  . Drug use: No  . Sexual activity: Never    Comment: 1st intercourse 77 yo-Fewer than 5 partners  Lifestyle  . Physical activity:    Days per week: Not on file    Minutes per session: Not on file  . Stress: Not on file  Relationships  . Social connections:    Talks on phone: Not on file    Gets together: Not on file    Attends religious service: Not on file    Active member of club or organization: Not on file    Attends meetings of clubs or organizations: Not on file     Relationship status: Not on file  . Intimate partner violence:    Fear of current or ex partner: Not on file    Emotionally abused: Not on file    Physically abused: Not on file    Forced sexual activity: Not on file  Other Topics Concern  . Not on file  Social History Narrative   [employer]-accounting      Married '60-81yr divorced; '84-2 years, divorced      3 daughters- '61, '70, '71; 2 sons-'53,'55 (schizophrenic-died); 10 grandchildren      Lives alone with 1 dog and 3 cats      Pt unsure of family history- was adopted.                   Past Surgical History:  Procedure Laterality Date  . APPENDECTOMY    . CARPOMETACARPEL SUSPENSION PLASTY Right 08/07/2013   Procedure: CARPOMETACARPEL [facility]) SUSPENSION PLASTY RIGHT THUMB;  Surgeon: Wynonia Sours, MD;  Location: [place];  Service: Orthopedics;  Laterality: Right;  . CARPOMETACARPEL SUSPENSION PLASTY Left 10/23/2015   Procedure: SUSPENSION PLASTY LEFT THUMB TRAPEZIUM EXCISION ABDUCTOR POLLICIS LONGUS TRANSFER;  Surgeon: Daryll Brod, MD;  Location: [place];  Service: Orthopedics;  Laterality: Left;  . CERVICAL FUSION  2007   Dr. Valli Glance approach  . JOINT REPLACEMENT Left   . Left brachial artery repair of pseudoaneurysm  2001   post a cath  . LUMBAR FUSION  09/2004   T12-L5 (Dr. Patrice Paradise)  . MINOR IRRIGATION AND DEBRIDEMENT OF WOUND Right 08/27/2014   Procedure: MINOR IRRIGATION AND DEBRIDEMENT OF WOUND;  Surgeon: Daryll Brod, MD;  Location: [place];  Service: Orthopedics;  Laterality: Right;  . ORIF TIBIA FRACTURE  2010   Left distal  . Percutanous Transluminal Angioplasty of Renal Arteris    . REPAIR EXTENSOR TENDON Right 09/26/2014   Procedure: REPAIR EXTENSOR TENDON RIGHT RING FINGER ;  Surgeon: Daryll Brod, MD;  Location: [place];  Service: Orthopedics;  Laterality: Right;  . SHOULDER ARTHROSCOPY  09/2008   Left  . TONSILLECTOMY AND  ADENOIDECTOMY    . TOTAL HIP ARTHROPLASTY  2008   left  . WISDOM TOOTH EXTRACTION      Family History  Adopted: Yes  Problem Relation Age of Onset  . Schizophrenia Son        Deceased 1  . Hypertension Daughter        Renal  . Healthy Son  x1  . Healthy Daughter        x2    Allergies  Allergen Reactions  . Amoxicillin     REACTION: Rash  . Codeine Hives  . Hydrocodone-Acetaminophen     REACTION: itching  . Hydromorphone Hcl   . Morphine And Related Hives and Itching  . Morphine Sulfate     REACTION: unspecified  . Nsaids     Ulcer    Current Outpatient Medications on File Prior to Visit  Medication Sig Dispense Refill  . alendronate (FOSAMAX) 70 MG tablet Take 1 tablet (70 mg total) by mouth every 7 (seven) days. Take with a full glass of water on an empty stomach. 4 tablet 11  . amLODipine (NORVASC) 5 MG tablet TAKE ONE TABLET BY MOUTH TWICE A DAY 180 tablet 0  . aspirin 81 MG tablet Take 81 mg by mouth daily.     Marland Kitchen atomoxetine (STRATTERA) 100 MG capsule Take 100 mg by mouth daily.    . BuPROPion HCl (WELLBUTRIN PO) Take by mouth.    . cloNIDine (CATAPRES) 0.1 MG tablet Take 1 tablet (0.1 mg total) by mouth 3 (three) times daily. 270 tablet 3  . DULoxetine (CYMBALTA) 60 MG capsule TAKE ONE CAPSULE BY MOUTH DAILY 30 capsule 5  . ferrous sulfate dried (SLOW FE) 160 (50 FE) MG TBCR Take 160 mg by mouth 2 (two) times daily. Patient has been taking Once Daily    . Glucosamine 500 MG TABS Take 1 tablet by mouth 2 (two) times daily. Glucosamine-Chondronton    . hydrochlorothiazide (MICROZIDE) 12.5 MG capsule TAKE ONE CAPSULE BY MOUTH DAILY AS NEEDED 10 capsule 0  . levothyroxine (SYNTHROID, LEVOTHROID) 112 MCG tablet TAKE ONE TABLET BY MOUTH DAILY BEFORE BREAKFAST 90 tablet 0  . losartan (COZAAR) 100 MG tablet TAKE ONE TABLET BY MOUTH DAILY 90 tablet 0  . OLANZapine (ZYPREXA) 5 MG tablet Take 15 mg by mouth at bedtime.     Marland Kitchen oxybutynin (DITROPAN-XL) 10 MG 24 hr  tablet Take 1 tablet (10 mg total) by mouth at bedtime. 30 tablet 11  . zolpidem (AMBIEN) 5 MG tablet Take 1 tablet (5 mg total) by mouth at bedtime as needed for sleep. 14 tablet 0   No current facility-administered medications on file prior to visit.     BP (!) 152/59   Pulse 86   Temp 97.8 F (36.6 C) (Oral)   Resp 16   Ht 5\' 3"  (1.6 m)   Wt 120 lb 9.6 oz (54.7 kg)   SpO2 97%   BMI 21.36 kg/m       Objective:   Physical Exam  .General Appearance- Not in acute distress.  HEENT Eyes- Scleraeral/Conjuntiva-bilat- Not Yellow. Mouth & Throat- Normal.  Chest and Lung Exam Auscultation: Breath sounds:-Normal. Adventitious sounds:- No Adventitious sounds.  Cardiovascular Auscultation:Rythm - Regular. Heart Sounds -Normal heart sounds.  Abdomen Inspection:-Inspection Normal.  Palpation/Perucssion: Palpation and Percussion of the abdomen reveal- Non Tender, No Rebound tenderness, No rigidity(Guarding) and No Palpable abdominal masses.  Liver:-Normal.  Spleen:- Normal.    Back- no cva tenderness.       Assessment & Plan:  You have had irregular bowel movements with intermittent episodes of constipation and diarrhea.  Last time you were describing more constipation and with your alternating pattern I decided to advise Metamucil.  Following only 2 days use of this he is described severe watery loose stool one event daily for the past 9 to 10 days.  Since recent use  Imodium 1 tablet yesterday resolved loose stools.  I do not want you to use any medication presently but see how you are bowel pattern does over the next 3 to 4 days.  If you do have recurrent loose stools as before then use only one Imodium daily.  If you start to get constipated/no bowel movement by Sunday then you could use Metamucil again.  I have discussed getting one view abdomen x-ray in the past and you declined that.  However if still having abnormal bowel pattern by Monday then we will put in at that  you x-ray.  Also with loose stools reported over the weekend then would want to get gastro panel.  I would want to do the studies in preparation for your potential GI referral.  Follow-up by phone or my chart this coming Monday.  If any severe change in signs symptoms over the weekend then recommend ED evaluation.  25 minutes spent with pt. 50% of time spent counseling on plan going forward in light of her varying/alternating constipation and diarrhea  hisotry.  Mackie Pai, PA-C

## 2018-02-02 NOTE — Patient Instructions (Signed)
You have had irregular bowel movements with intermittent episodes of constipation and diarrhea.  Last time you were describing more constipation and with your alternating pattern I decided to advise Metamucil.  Following only 2 days use of this he is described severe watery loose stool one event daily for the past 9 to 10 days.  Since recent use Imodium 1 tablet yesterday resolved loose stools.  I do not want you to use any medication presently but see how you are bowel pattern does over the next 3 to 4 days.  If you do have recurrent loose stools as before then use only one Imodium daily.  If you start to get constipated/no bowel movement by Sunday then you could use Metamucil again.  I have discussed getting one view abdomen x-ray in the past and you declined that.  However if still having abnormal bowel pattern by Monday then we will put in at that you x-ray.  Also with loose stools reported over the weekend then would want to get gastro panel.  I would want to do the studies in preparation for your potential GI referral.  Follow-up by phone or my chart this coming Monday.  If any severe change in signs symptoms over the weekend then recommend ED evaluation.

## 2018-02-03 ENCOUNTER — Telehealth: Payer: Self-pay

## 2018-02-03 ENCOUNTER — Telehealth: Payer: Self-pay | Admitting: *Deleted

## 2018-02-03 DIAGNOSIS — E039 Hypothyroidism, unspecified: Secondary | ICD-10-CM

## 2018-02-03 NOTE — Telephone Encounter (Signed)
Copied from [place] [id number]. Topic: General - Other >> Feb 03, 2018  8:32 AM Carolyn Stare wrote:  Pt call to let Mackie Pai know that she would like to go ahead with the XRAY

## 2018-02-03 NOTE — Telephone Encounter (Signed)
Patient is calling to report that she had a [facility] lab done and her TSH range is off- range is 0.5-4.59 - her result is 24.64.  Patient wants to know when the abdominal X-ray is to be scheduled.

## 2018-02-05 ENCOUNTER — Telehealth: Payer: Self-pay | Admitting: Medical

## 2018-02-05 DIAGNOSIS — K5900 Constipation, unspecified: Secondary | ICD-10-CM

## 2018-02-05 NOTE — Telephone Encounter (Signed)
I put in xray abdomen order. She does not need appointment to get that done.  I did go ahead and place repeat tsh and t4 order. Want to repeat and verify reading before making adjustment. Sometimes outside lab differ. Dramatic rise of tsh?

## 2018-02-05 NOTE — Telephone Encounter (Signed)
Pt does not need appointment for xray. I placed the order and she can get that done this week.  I do want her to repeat tsh and t4.

## 2018-02-05 NOTE — Telephone Encounter (Signed)
Xray of abdomen placed.

## 2018-02-05 NOTE — Addendum Note (Signed)
Addended by: Anabel Halon on: 02/05/2018 10:11 AM   Modules accepted: Orders

## 2018-02-06 ENCOUNTER — Ambulatory Visit (HOSPITAL_BASED_OUTPATIENT_CLINIC_OR_DEPARTMENT_OTHER)
Admission: RE | Admit: 2018-02-06 | Discharge: 2018-02-06 | Disposition: A | Discharge status: Home / Self Care | Payer: Medicare Other | Source: Ambulatory Visit | Attending: Medical | Admitting: Medical

## 2018-02-06 ENCOUNTER — Telehealth: Payer: Self-pay | Admitting: Medical

## 2018-02-06 ENCOUNTER — Ambulatory Visit: Payer: Self-pay

## 2018-02-06 DIAGNOSIS — K5900 Constipation, unspecified: Secondary | ICD-10-CM

## 2018-02-06 DIAGNOSIS — K567 Ileus, unspecified: Secondary | ICD-10-CM

## 2018-02-06 NOTE — Telephone Encounter (Signed)
Referral to GI placed

## 2018-02-06 NOTE — Telephone Encounter (Signed)
Pt. Reports she was seen recently for diarrhea. Still having 3-4 watery stools per day. "Lomotil slows it up a little." "I can't control the diarrhea." Denies any vomiting, fever or pain. States "I can't go anywhere with this." States she was told an x-ray could be done and stool sample. Would like to proceed with this. Please advise pt.  Answer Assessment - Initial Assessment Questions 1. DIARRHEA SEVERITY: "How bad is the diarrhea?" "How many extra stools have you had in the past 24 hours than normal?"    - NO DIARRHEA (SCALE 0)   - MILD (SCALE 1-3): Few loose or mushy BMs; increase of 1-3 stools over normal daily number of stools; mild increase in ostomy output.   -  MODERATE (SCALE 4-7): Increase of 4-6 stools daily over normal; moderate increase in ostomy output. * SEVERE (SCALE 8-10; OR 'WORST POSSIBLE'): Increase of 7 or more stools daily over normal; moderate increase in ostomy output; incontinence.     3 2. ONSET: "When did the diarrhea begin?"      Started 2 weeks ago 3. BM CONSISTENCY: "How loose or watery is the diarrhea?"      Watery 4. VOMITING: "Are you also vomiting?" If so, ask: "How many times in the past 24 hours?"      No 5. ABDOMINAL PAIN: "Are you having any abdominal pain?" If yes: "What does it feel like?" (e.g., crampy, dull, intermittent, constant)      No 6. ABDOMINAL PAIN SEVERITY: If present, ask: "How bad is the pain?"  (e.g., Scale 1-10; mild, moderate, or severe)   - MILD (1-3): doesn't interfere with normal activities, abdomen soft and not tender to touch    - MODERATE (4-7): interferes with normal activities or awakens from sleep, tender to touch    - SEVERE (8-10): excruciating pain, doubled over, unable to do any normal activities       No pain 7. ORAL INTAKE: If vomiting, "Have you been able to drink liquids?" "How much fluids have you had in the past 24 hours?"     No problems 8. HYDRATION: "Any signs of dehydration?" (e.g., dry mouth [not just dry lips],  too weak to stand, dizziness, new weight loss) "When did you last urinate?"     No 9. EXPOSURE: "Have you traveled to a foreign country recently?" "Have you been exposed to anyone with diarrhea?" "Could you have eaten any food that was spoiled?"     No 10. ANTIBIOTIC USE: "Are you taking antibiotics now or have you taken antibiotics in the past 2 months?"       No 11. OTHER SYMPTOMS: "Do you have any other symptoms?" (e.g., fever, blood in stool)       No 12. PREGNANCY: "Is there any chance you are pregnant?" "When was your last menstrual period?"       n/a  Protocols used: DIARRHEA-A-AH

## 2018-02-07 ENCOUNTER — Telehealth: Payer: Self-pay | Admitting: Medical

## 2018-02-07 ENCOUNTER — Other Ambulatory Visit (INDEPENDENT_AMBULATORY_CARE_PROVIDER_SITE_OTHER): Payer: Medicare Other

## 2018-02-07 ENCOUNTER — Ambulatory Visit (HOSPITAL_COMMUNITY): Payer: Medicare Other

## 2018-02-07 ENCOUNTER — Telehealth: Payer: Self-pay

## 2018-02-07 ENCOUNTER — Ambulatory Visit (HOSPITAL_COMMUNITY)
Admission: RE | Admit: 2018-02-07 | Discharge: 2018-02-07 | Disposition: A | Discharge status: Home / Self Care | Payer: Medicare Other | Source: Ambulatory Visit | Attending: Medical | Admitting: Medical

## 2018-02-07 DIAGNOSIS — R197 Diarrhea, unspecified: Secondary | ICD-10-CM | POA: Diagnosis not present

## 2018-02-07 DIAGNOSIS — K56609 Unspecified intestinal obstruction, unspecified as to partial versus complete obstruction: Secondary | ICD-10-CM

## 2018-02-07 DIAGNOSIS — K567 Ileus, unspecified: Secondary | ICD-10-CM | POA: Diagnosis not present

## 2018-02-07 DIAGNOSIS — R5383 Other fatigue: Secondary | ICD-10-CM

## 2018-02-07 DIAGNOSIS — K449 Diaphragmatic hernia without obstruction or gangrene: Secondary | ICD-10-CM | POA: Diagnosis not present

## 2018-02-07 DIAGNOSIS — E039 Hypothyroidism, unspecified: Secondary | ICD-10-CM | POA: Diagnosis not present

## 2018-02-07 LAB — COMPREHENSIVE METABOLIC PANEL
ALK PHOS: 50 U/L (ref 39–117)
ALT: 12 U/L (ref 0–35)
AST: 13 U/L (ref 0–37)
Albumin: 4.2 g/dL (ref 3.5–5.2)
BILIRUBIN TOTAL: 0.2 mg/dL (ref 0.2–1.2)
BUN: 20 mg/dL (ref 6–23)
CO2: 25 meq/L (ref 19–32)
Calcium: 9.9 mg/dL (ref 8.4–10.5)
Chloride: 108 mEq/L (ref 96–112)
Creatinine, Ser: 0.91 mg/dL (ref 0.40–1.20)
GFR: 63.33 mL/min (ref >60.00)
GLUCOSE: 95 mg/dL (ref 70–99)
Potassium: 3.9 mEq/L (ref 3.5–5.1)
Sodium: 140 mEq/L (ref 135–145)
Total Protein: 6.7 g/dL (ref 6.0–8.3)

## 2018-02-07 LAB — CBC WITH DIFFERENTIAL/PLATELET
BASOS ABS: 0 10*3/uL (ref 0.0–0.1)
BASOS PCT: 0.3 % (ref 0.0–3.0)
EOS ABS: 0 10*3/uL (ref 0.0–0.7)
Eosinophils Relative: 0.6 % (ref 0.0–5.0)
HEMATOCRIT: 34.8 % — AB (ref 36.0–46.0)
HEMOGLOBIN: 11.7 g/dL — AB (ref 12.0–15.0)
LYMPHS PCT: 31.6 % (ref 12.0–46.0)
Lymphs Abs: 2.1 10*3/uL (ref 0.7–4.0)
MCHC: 33.7 g/dL (ref 30.0–36.0)
MCV: 97.8 fl (ref 78.0–100.0)
MONOS PCT: 10.4 % (ref 3.0–12.0)
Monocytes Absolute: 0.7 10*3/uL (ref 0.1–1.0)
NEUTROS ABS: 3.7 10*3/uL (ref 1.4–7.7)
Neutrophils Relative %: 57.1 % (ref 43.0–77.0)
PLATELETS: 236 10*3/uL (ref 150.0–400.0)
RBC: 3.56 Mil/uL — ABNORMAL LOW (ref 3.87–5.11)
RDW: 14.2 % (ref 11.5–15.5)
WBC: 6.6 10*3/uL (ref 4.0–10.5)

## 2018-02-07 LAB — TSH: TSH: 23.31 u[IU]/mL — AB (ref 0.35–4.50)

## 2018-02-07 LAB — T4, FREE: FREE T4: 0.63 ng/dL (ref 0.60–1.60)

## 2018-02-07 LAB — MAGNESIUM: Magnesium: 2 mg/dL (ref 1.5–2.5)

## 2018-02-07 MED ORDER — IOHEXOL 300 MG/ML  SOLN
100.0000 mL | Freq: Once | INTRAMUSCULAR | Status: AC | PRN
  Administered 2018-02-07: 100 mL via INTRAVENOUS

## 2018-02-07 MED ORDER — SODIUM CHLORIDE (PF) 0.9 % IJ SOLN
INTRAMUSCULAR | Status: AC
  Filled 2018-02-07: qty 50

## 2018-02-07 NOTE — Telephone Encounter (Signed)
I did talk with Dr. Lyndel Safe  GI MD today.  I explained patient's recent alternating with stools and constipation since September.  Explained recent mild ileus seen on x-ray.  Also explained patient did not have any extreme pain just a little bit of gassy and mild discomfort at times.  Still having occasional loose diarrhea after feeling the constipated.  Dr. do recommend she get a TSH, CBC, magnesium,  gastro panel with leukocytes and CT abdomen pelvis with contrast.  I arrange patient to have labs done today at 130 this afternoon.  Labs would be stat except for TSH.  Also placed order for CT abdomen pelvis to be done at [facility] since our [place] at the medicine not working per [place].  Gwen is getting patient scheduled to get CT abdomen this afternoon.   I discussed the above with patient and advised to hydrate well with [employer] water or sugar-free Gatorade today.  Presently avoid solids.  She had questions about that Thanksgiving and I advised her that wanted to review lab results and imaging results first before advising what she could eat on Thanksgiving.  Patient is aware of any severe abdomen pain/worsening signs symptoms then be seen in the ED.

## 2018-02-07 NOTE — Telephone Encounter (Signed)
Opened to review 

## 2018-02-07 NOTE — Addendum Note (Signed)
Addended by: Kelle Darting A on: 02/07/2018 01:26 PM   Modules accepted: Orders

## 2018-02-07 NOTE — Telephone Encounter (Signed)
I did go over patients ct results and lab with her tonight. She will drop of stool studies tomorrow morning.

## 2018-02-07 NOTE — Telephone Encounter (Signed)
Per Dr. Carollee Herter, patient advised there was no definite explanation for her pain found on CT. Advised a hiatal hernia was seen and to keep appointment with Dr. Lyndel Safe on 12-02-1+9.

## 2018-02-07 NOTE — Telephone Encounter (Signed)
See notes already made in chart. Discussed with GI MD. Ordered labs/imiging  and contacted pt.

## 2018-02-07 NOTE — Telephone Encounter (Signed)
Patient informed, understood & agreed; see result note & provider note/SLS 11/26

## 2018-02-07 NOTE — Telephone Encounter (Signed)
Informed pt of her GI appointment on Monday at 9:30

## 2018-02-07 NOTE — Telephone Encounter (Signed)
Provider has left for the day. Report passed to DOD Dr. Lawson Radar. Advised Imaging who states they will let patient go home.

## 2018-02-08 ENCOUNTER — Other Ambulatory Visit: Payer: Medicare Other

## 2018-02-08 DIAGNOSIS — R197 Diarrhea, unspecified: Secondary | ICD-10-CM

## 2018-02-10 ENCOUNTER — Telehealth: Payer: Self-pay | Admitting: Medical

## 2018-02-10 LAB — FECAL LACTOFERRIN, QUANT
FECAL LACTOFERRIN: POSITIVE — AB
MICRO NUMBER: 91431449
SPECIMEN QUALITY:: ADEQUATE

## 2018-02-10 LAB — GASTROINTESTINAL PATHOGEN PANEL PCR
C. DIFFICILE TOX A/B, PCR: NOT DETECTED
Campylobacter, PCR: NOT DETECTED
Cryptosporidium, PCR: NOT DETECTED
E COLI (ETEC) LT/ST, PCR: NOT DETECTED
E COLI (STEC) STX1/STX2, PCR: NOT DETECTED
E coli 0157, PCR: NOT DETECTED
Giardia lamblia, PCR: NOT DETECTED
Norovirus, PCR: NOT DETECTED
Rotavirus A, PCR: NOT DETECTED
SALMONELLA, PCR: NOT DETECTED
Shigella, PCR: NOT DETECTED

## 2018-02-10 NOTE — Telephone Encounter (Signed)
No bacteria detected on stool panel studies.

## 2018-02-13 ENCOUNTER — Other Ambulatory Visit: Payer: Self-pay | Admitting: Medical

## 2018-02-13 ENCOUNTER — Encounter: Payer: Self-pay | Admitting: Gastroenterology

## 2018-02-13 ENCOUNTER — Ambulatory Visit (INDEPENDENT_AMBULATORY_CARE_PROVIDER_SITE_OTHER): Payer: Medicare Other | Admitting: Gastroenterology

## 2018-02-13 VITALS — BP 132/72 | HR 85 | Ht 63.0 in | Wt 121.2 lb

## 2018-02-13 DIAGNOSIS — R197 Diarrhea, unspecified: Secondary | ICD-10-CM | POA: Diagnosis not present

## 2018-02-13 MED ORDER — DIPHENOXYLATE-ATROPINE 2.5-0.025 MG PO TABS: 1.0000 | ORAL_TABLET | Freq: Three times a day (TID) | ORAL | 0 refills | Status: DC | PRN

## 2018-02-13 NOTE — Patient Instructions (Addendum)
If you are age 79 or older, your body mass index should be between 23-30. Your Body mass index is 21.48 kg/m. If this is out of the aforementioned range listed, please consider follow up with your Primary Care Provider.  If you are age 25 or younger, your body mass index should be between 19-25. Your Body mass index is 21.48 kg/m. If this is out of the aformentioned range listed, please consider follow up with your Primary Care Provider.   We have given a prescription for the following medications: Lomotil  Stop all herbal medications.   Please call Dr. Leland Her nurse Denton Ar, RN)  in 2 weeks at [phone]  to let her now how you are doing.   Thank you,  Dr. Jackquline Denmark

## 2018-02-13 NOTE — Progress Notes (Signed)
Chief Complaint:   Referring Provider:  Mackie Pai, PA-C      ASSESSMENT AND PLAN;   #1. Diarrhea- ?  Etiology.  H/O constipation in the past.  Negative stool studies for GI pathogens, positive for WBCs.  Negative CT scan abdo/pelvis 01/2018, neg colon 2004.  #2. H/O chronic IDA, on iron supplementation. Baseline Hb 10.5-11.5).  Plan: - Stop metamucil. - lomotil 1 tablet p.o. 3 times daily as needed. - Align 1 tab po qd x 2 weeks, then can stop. - Stop all herbal meds. - If not better in 2 weeks, proceed with colonoscopy.  Patient would like to hold off on colonoscopy if possible.  She does understand the risks and benefits including small but definite risks of missing colorectal neoplasms.   HPI:    Rose [name] is a 77 y.o. female  With diarrhea x 3 weeks 2-3/day Better with lomotil 8-10 hrs after meals With nocturnal symptoms No melena or hematochezia No weight loss Had negative stool studies except for WBCs. Had negative CT scan of the abdomen and pelvis Denies being on any antibiotics recently Her TSH was elevated, dose of Synthroid is currently being readjusted. Patient has history of chronic IDA with negative Hemoccult stool several times. She has history of long-standing constipation. No nonsteroidals.   Additional social history:  [employer]ing      Married '60-61yr divorced; '84-2 years, divorced      3 daughters- '61, '70, '71; 2 sons-'53,'55 (schizophrenic-died); 10 grandchildren      Lives alone with 1 dog and 3 cats          Past Medical History:  Diagnosis Date  . Abnormal blood pressure    left arm from old brachial artery repair  . Anemia   . Anxiety   . Atherosclerosis of renal artery ([place])   . Attention deficit disorder without mention of hyperactivity   . Basal cell carcinoma of face   . Benign heart murmur   . Carotid artery disease ([place])   . Carotid bruit    Left  . Centrilobular emphysema  ([place])   . Chronic bronchitis ([place])   . Depression   . DJD (degenerative joint disease) of hip   . Esophageal reflux   . History of depression   . History of spinal fusion   . Hypertension   . Morton's neuroma    Hx of, Left  . Myalgia and myositis, unspecified   . Other tenosynovitis of hand and wrist   . Peripheral neuropathy   . Personal history of alcoholism ([place])   . Personal history of peptic ulcer disease   . Pulmonary nodule   . Rotator cuff disorder    pain  . Solitary cyst of breast   . Subclavian steal syndrome   . Unspecified hypothyroidism   . Urinary incontinence   . Wears glasses     Past Surgical History:  Procedure Laterality Date  . APPENDECTOMY    . CARPOMETACARPEL SUSPENSION PLASTY Right 08/07/2013   Procedure: CARPOMETACARPEL [facility]) SUSPENSION PLASTY RIGHT THUMB;  Surgeon: Wynonia Sours, MD;  Location: [facility];  Service: Orthopedics;  Laterality: Right;  . CARPOMETACARPEL SUSPENSION PLASTY Left 10/23/2015   Procedure: SUSPENSION PLASTY LEFT THUMB TRAPEZIUM EXCISION ABDUCTOR POLLICIS LONGUS TRANSFER;  Surgeon: Daryll Brod, MD;  Location: [facility];  Service: Orthopedics;  Laterality: Left;  . CERVICAL FUSION  2007   Dr. Valli Glance approach  . JOINT REPLACEMENT Left   . Left  brachial artery repair of pseudoaneurysm  2001   post a cath  . LUMBAR FUSION  09/2004   T12-L5 (Dr. Patrice Paradise)  . MINOR IRRIGATION AND DEBRIDEMENT OF WOUND Right 08/27/2014   Procedure: MINOR IRRIGATION AND DEBRIDEMENT OF WOUND;  Surgeon: Daryll Brod, MD;  Location: [place];  Service: Orthopedics;  Laterality: Right;  . ORIF TIBIA FRACTURE  2010   Left distal  . Percutanous Transluminal Angioplasty of Renal Arteris    . REPAIR EXTENSOR TENDON Right 09/26/2014   Procedure: REPAIR EXTENSOR TENDON RIGHT RING FINGER ;  Surgeon: Daryll Brod, MD;  Location: [place];  Service: Orthopedics;  Laterality: Right;  . SHOULDER  ARTHROSCOPY  09/2008   Left  . TONSILLECTOMY AND ADENOIDECTOMY    . TOTAL HIP ARTHROPLASTY  2008   left  . WISDOM TOOTH EXTRACTION      Family History  Adopted: Yes  Problem Relation Age of Onset  . Schizophrenia Son        Deceased 15  . Hypertension Daughter        Renal  . Healthy Son        x1  . Healthy Daughter        x2    Social History   Tobacco Use  . Smoking status: Former Smoker    Packs/day: 1.00    Years: 25.00    Pack years: 25.00    Types: Cigarettes    Last attempt to quit: 03/16/1983    Years since quitting: 34.9  . Smokeless tobacco: Never Used  Substance Use Topics  . Alcohol use: Yes    Comment: 2-3 glasses wine daily  . Drug use: No    Current Outpatient Medications  Medication Sig Dispense Refill  . alendronate (FOSAMAX) 70 MG tablet Take 1 tablet (70 mg total) by mouth every 7 (seven) days. Take with a full glass of water on an empty stomach. 4 tablet 11  . amLODipine (NORVASC) 5 MG tablet TAKE ONE TABLET BY MOUTH TWICE A DAY 180 tablet 0  . aspirin 81 MG tablet Take 81 mg by mouth daily.     Marland Kitchen atomoxetine (STRATTERA) 100 MG capsule Take 100 mg by mouth daily.    . BuPROPion HCl (WELLBUTRIN PO) Take by mouth.    . cloNIDine (CATAPRES) 0.1 MG tablet Take 1 tablet (0.1 mg total) by mouth 3 (three) times daily. 270 tablet 3  . DULoxetine (CYMBALTA) 60 MG capsule TAKE ONE CAPSULE BY MOUTH DAILY 30 capsule 5  . ferrous sulfate dried (SLOW FE) 160 (50 FE) MG TBCR Take 160 mg by mouth 2 (two) times daily. Patient has been taking Once Daily    . Glucosamine 500 MG TABS Take 1 tablet by mouth 2 (two) times daily. Glucosamine-Chondronton    . hydrochlorothiazide (MICROZIDE) 12.5 MG capsule TAKE ONE CAPSULE BY MOUTH DAILY AS NEEDED 10 capsule 0  . levothyroxine (SYNTHROID, LEVOTHROID) 112 MCG tablet TAKE ONE TABLET BY MOUTH DAILY BEFORE BREAKFAST 90 tablet 0  . losartan (COZAAR) 100 MG tablet TAKE ONE TABLET BY MOUTH DAILY 90 tablet 0  . OLANZapine  (ZYPREXA) 5 MG tablet Take 15 mg by mouth at bedtime.     Marland Kitchen oxybutynin (DITROPAN-XL) 10 MG 24 hr tablet Take 1 tablet (10 mg total) by mouth at bedtime. 30 tablet 11  . zolpidem (AMBIEN) 5 MG tablet Take 1 tablet (5 mg total) by mouth at bedtime as needed for sleep. 14 tablet 0   No current facility-administered  medications for this visit.     Allergies  Allergen Reactions  . Amoxicillin     REACTION: Rash  . Codeine Hives  . Hydrocodone-Acetaminophen     REACTION: itching  . Hydromorphone Hcl   . Morphine And Related Hives and Itching  . Morphine Sulfate     REACTION: unspecified  . Nsaids     Ulcer    Review of Systems:  Constitutional: Denies fever, chills, diaphoresis, appetite change and fatigue.  HEENT: Denies photophobia, eye pain, redness, hearing loss, ear pain, congestion, sore throat, rhinorrhea, sneezing, mouth sores, neck pain, neck stiffness and tinnitus.   Respiratory: Denies SOB, DOE, cough, chest tightness,  and wheezing.   Cardiovascular: Denies chest pain, palpitations and leg swelling.  Genitourinary: Denies dysuria, urgency, frequency, hematuria, flank pain and difficulty urinating.  Musculoskeletal: Denies myalgias, back pain, joint swelling, arthralgias and gait problem.  Skin: No rash.  Neurological: Denies dizziness, seizures, syncope, weakness, light-headedness, numbness and headaches.  Hematological: Denies adenopathy. Easy bruising, personal or family bleeding history  Psychiatric/Behavioral: Has anxiety or depression     Physical Exam:    BP 132/72   Pulse 85   Ht 5\' 3"  (1.6 m)   Wt 121 lb 4 oz (55 kg)   BMI 21.48 kg/m  Filed Weights   02/13/18 0928  Weight: 121 lb 4 oz (55 kg)   Constitutional:  Well-developed, in no acute distress. Psychiatric: Normal mood and affect. Behavior is normal. HEENT: Pupils normal.  Conjunctivae are normal. No scleral icterus. Neck supple.  Cardiovascular: Normal rate, regular rhythm. No  edema Pulmonary/chest: Effort normal and breath sounds normal. No wheezing, rales or rhonchi. Abdominal: Soft, nondistended. Nontender. Bowel sounds active throughout. There are no masses palpable. No hepatomegaly. Rectal:  defered Neurological: Alert and oriented to person place and time. Skin: Skin is warm and dry. No rashes noted.  Data Reviewed: I have personally reviewed following labs and imaging studies  CBC: CBC Latest Ref Rng & Units 02/07/2018 10/29/2015 09/19/2015  WBC 4.0 - 10.5 K/uL 6.6 4.9 4.7  Hemoglobin 12.0 - 15.0 g/dL 11.7(L) 11.5(L) 10.7(L)  Hematocrit 36.0 - 46.0 % 34.8(L) 34.0(L) 32.0(L)  Platelets 150.0 - 400.0 K/uL 236.0 275.0 230.0    CMP: CMP Latest Ref Rng & Units 02/07/2018 10/31/2017 06/22/2017  Glucose 70 - 99 mg/dL 95 98 98  BUN 6 - 23 mg/dL 20 20 32(H)  Creatinine 0.40 - 1.20 mg/dL 0.91 1.00 1.05  Sodium 135 - 145 mEq/L 140 140 140  Potassium 3.5 - 5.1 mEq/L 3.9 4.4 5.2(H)  Chloride 96 - 112 mEq/L 108 104 106  CO2 19 - 32 mEq/L 25 27 28   Calcium 8.4 - 10.5 mg/dL 9.9 10.3 10.2  Total Protein 6.0 - 8.3 g/dL 6.7 7.2 7.2  Total Bilirubin 0.2 - 1.2 mg/dL 0.2 0.1(L) 0.3  Alkaline Phos 39 - 117 U/L 50 68 74  AST 0 - 37 U/L 13 12 18   ALT 0 - 35 U/L 12 22 18     GFR: Estimated Creatinine Clearance: 41.5 mL/min (by C-G formula based on SCr of 0.91 mg/dL). Liver Function Tests: Recent Labs  Lab 02/07/18 1327  AST 13  ALT 12  ALKPHOS 50  BILITOT 0.2  PROT 6.7  ALBUMIN 4.2       Radiology Studies: Dg Abd 1 View  Result Date: 02/06/2018 CLINICAL DATA:  Constipation EXAM: ABDOMEN - 1 VIEW COMPARISON:  None. FINDINGS: Negative for bowel obstruction. Gas in mildly distended colon. No bowel edema. Negative for  urinary tract calculi. Lumbar dextroscoliosis. Pedicle screw fusion L1 through L5. Left hip replacement. Moderate degenerative change right hip. IMPRESSION: Mild colonic ileus without obstruction. Electronically Signed   By: Franchot Gallo M.D.   On:  02/06/2018 13:59   Ct Abdomen Pelvis W Contrast  Result Date: 02/07/2018 CLINICAL DATA:  History of low-grade bowel obstruction, diarrhea over the last 3 days EXAM: CT ABDOMEN AND PELVIS WITH CONTRAST TECHNIQUE: Multidetector CT imaging of the abdomen and pelvis was performed using the standard protocol following bolus administration of intravenous contrast. CONTRAST:  165mL OMNIPAQUE IOHEXOL 300 MG/ML  SOLN COMPARISON:  KUB of 02/06/2017 FINDINGS: Lower chest: The lung bases are clear. The heart is mildly enlarged. Some contrast in the distal esophagus may indicate small hiatal hernia with gastroesophageal reflux. Correlate clinically. Hepatobiliary: The liver enhances with no focal abnormality. Minimal prominence of central intrahepatic ducts is present. No calcified gallstones are seen. A large do descending duodenal diverticulum appears to be immediately adjacent to the ampulla. No common bile duct dilatation is evident. Pancreas: The pancreas is normal in size and the pancreatic duct is not dilated. Spleen: The spleen is unremarkable although there are several scattered splenic calcified granulomas present consistent with prior granulomatous disease. Adrenals/Urinary Tract: The adrenal glands appear normal. A large cyst emanates from the upper pole of the left kidney measuring 3.1 cm. A cyst emanates from the lower pole of the left kidney posteriorly measuring 2.6 cm. The left kidney appears compensatory hypertrophied relative to the more diminutive right kidney. No hydronephrosis is seen. The ureters appear to be normal in caliber. The urinary bladder is not well distended but no significant abnormality is noted. Stomach/Bowel: The stomach is distended with oral contrast. Again there does appear to be a small hiatal hernia with probable reflux of contrast into the distal esophagus. No small bowel dilatation is currently seen and no edema is evident. The rectosigmoid colon is somewhat tortuous and there is  a moderate amount of feces throughout the entire colon. No edema of the colonic mucosa is seen. The terminal ileum is opacified and no dilatation is seen. The appendix has by history been resected. Vascular/Lymphatic: The abdominal aorta is normal in caliber with moderate abdominal aortic atherosclerosis noted. No adenopathy is seen. Reproductive: The uterus is somewhat atrophic. No adnexal lesion is seen in no fluid is noted within the pelvis. Other: No abdominal wall hernia is seen. Musculoskeletal: The lumbar vertebrae are in normal alignment. There is posterior hardware for fusion from L1-L5. However, there is abnormality of the sacrum in the region of the spinal canal. Rounded lesions are present possibly representing Tarlov cysts, but clinical correlation is recommended. IMPRESSION: No definite explanation for the patient's pain is seen. No current small-bowel obstruction is evident. 1. Large duodenal diverticulum appears to be very near the ampulla but no current ductal dilatation is evident. 2. Small hiatal hernia with probable gastroesophageal reflux. 3. Left renal cysts are noted with hypertrophy of the left kidney and atrophy of the right kidney. No hydronephrosis. 4. Suspect Tarlov cysts in the sacrococcygeal region. Correlate clinically. Electronically Signed   By: Ivar Drape M.D.   On: 02/07/2018 16:38      Carmell Austria, MD 02/13/2018, 9:38 AM  Cc: Mackie Pai, PA-C

## 2018-02-14 ENCOUNTER — Telehealth: Payer: Self-pay | Admitting: Medical

## 2018-02-14 MED ORDER — LEVOTHYROXINE SODIUM 125 MCG PO CAPS: ORAL_CAPSULE | ORAL | 3 refills | Status: DC

## 2018-02-14 NOTE — Telephone Encounter (Signed)
Copied from [place] [id number]. Topic: Quick Communication - See Telephone Encounter >> Feb 14, 2018 12:57 PM Antonieta Iba C wrote: CRM for notification. See Telephone encounter for: 02/14/18.  Pt says that she received her lab results on My-chart and it is showing that her TSH is to high. Pt says that provider is going to want to increase her thyroid medication. Pt says that she feels that provider missed her result due to the holidays    CB: [phone]

## 2018-02-14 NOTE — Telephone Encounter (Signed)
I saw patient elevated tsh as she stated. So sent in higher dose at 125 mcg dose to daily. Want her to follow up in 3 months for office visit and repeat tsh/thyroid labs.

## 2018-02-15 NOTE — Telephone Encounter (Signed)
Pt.notified

## 2018-02-16 ENCOUNTER — Other Ambulatory Visit: Payer: Self-pay | Admitting: Medical

## 2018-02-17 ENCOUNTER — Ambulatory Visit: Payer: Self-pay | Admitting: Gastroenterology

## 2018-02-24 ENCOUNTER — Other Ambulatory Visit: Payer: Self-pay | Admitting: Medical

## 2018-02-28 ENCOUNTER — Telehealth: Payer: Self-pay | Admitting: Gastroenterology

## 2018-02-28 DIAGNOSIS — R197 Diarrhea, unspecified: Secondary | ICD-10-CM

## 2018-02-28 NOTE — Telephone Encounter (Signed)
Pt called in stating that she is still having Diarrhea.

## 2018-02-28 NOTE — Telephone Encounter (Signed)
Pt is calling back about her diarrhea and wants to know what she can do

## 2018-03-01 NOTE — Telephone Encounter (Signed)
Called and spoke with patient- patient reports no abdominal cramping/pain, no rectal bleeding/pain; patient reports she stopped the Metamucil; Align RX regimen completed; Lomotil used 3 x/day as ordered with no relief of symptoms; also reports she stopped ALL herbal medicines; patient reports "there is no warning when the diarrhea will hit"; patient reports she is following a bland diet; Patient advised to take Imodium as directed on the box and to update the office in 2-5 days in order to reassess symptoms; patient verbalized understanding of information/instructions; Patient was advised to call back if questions/concerns arise;

## 2018-03-01 NOTE — Telephone Encounter (Signed)
If still with problems, start cholestyramine 4 g p.o. twice daily, 2 hours before or after rest of the medications. If still not better in 2 weeks, needs colonoscopy with MiraLAX preparation See previous note.

## 2018-03-02 MED ORDER — CHOLESTYRAMINE 4 G PO PACK: 4.0000 g | PACK | Freq: Two times a day (BID) | ORAL | 12 refills | Status: DC

## 2018-03-02 NOTE — Telephone Encounter (Signed)
Called and spoke with patient- patient reports she is "doing very well after being able to take some Imodium and being able to get out and about the town and back to my life"; patient informed of MD recommendations concerning RX that was sent to patient's pharmacy as she should not stay on Imodium as a long term medication;   Patient reports she "feels like I am better controlled with my bowel movements when I take the [place] so I am going to continue to take it unless that is not want the doctor wants me to do";  patient advised to call back to office to give update on new medication and treatment of symptoms after a few weeks; patient verbalized understanding of information/instructions;

## 2018-03-02 NOTE — Telephone Encounter (Signed)
Left message for patient to call back  

## 2018-03-09 ENCOUNTER — Other Ambulatory Visit: Payer: Self-pay | Admitting: Gynecology

## 2018-03-09 NOTE — Telephone Encounter (Signed)
Annual scheduled on 03/21/18

## 2018-03-15 ENCOUNTER — Other Ambulatory Visit: Payer: Self-pay | Admitting: Medical

## 2018-03-15 DIAGNOSIS — I10 Essential (primary) hypertension: Secondary | ICD-10-CM

## 2018-03-20 ENCOUNTER — Telehealth: Payer: Self-pay | Admitting: Gastroenterology

## 2018-03-20 NOTE — Telephone Encounter (Signed)
Called and spoke with patient-patient reports she is doing very well IF she takes the Cholestyramine -if she misses a dose the diarrhea returns "not awful but it does come back"- patient reports she wants to try to get the dose down to once per day; patient is requesting to know how long she needs to stay on this medication-if she comes off of the medication-does that mean the colonoscopy is the next step for the return of symptoms?  Patient reports that if the colonoscopy is the next step she would like to just go ahead and do that so she does not have to be on the Cholestyramine powder any more;   Please advise

## 2018-03-20 NOTE — Telephone Encounter (Signed)
Pt calling to report on her condition.  Pt requested a CB.

## 2018-03-21 ENCOUNTER — Ambulatory Visit (INDEPENDENT_AMBULATORY_CARE_PROVIDER_SITE_OTHER): Payer: Medicare Other | Admitting: Gynecology

## 2018-03-21 ENCOUNTER — Encounter: Payer: Self-pay | Admitting: Gynecology

## 2018-03-21 VITALS — BP 122/82 | Ht 62.0 in | Wt 116.0 lb

## 2018-03-21 DIAGNOSIS — Z01419 Encounter for gynecological examination (general) (routine) without abnormal findings: Secondary | ICD-10-CM

## 2018-03-21 DIAGNOSIS — N952 Postmenopausal atrophic vaginitis: Secondary | ICD-10-CM

## 2018-03-21 DIAGNOSIS — M810 Age-related osteoporosis without current pathological fracture: Secondary | ICD-10-CM

## 2018-03-21 MED ORDER — ALENDRONATE SODIUM 70 MG PO TABS: ORAL_TABLET | ORAL | 12 refills | Status: DC

## 2018-03-21 NOTE — Telephone Encounter (Signed)
My feeling is that she needs to be on cholestyramine for the next few months at least Can certainly try to reduce the dose. For colonoscopy-she also has iron deficiency anemia-proceed with colonoscopy with MiraLAX preparation.

## 2018-03-21 NOTE — Telephone Encounter (Signed)
Left message for patient to call back concerning her scheduled pre visit and colonoscopy;   Patient has been scheduled for her pre visit for her colonoscopy on 03/28/2018 arrival at 1:45 pm for appt at 2:00 pm;   Patient has also been scheduled for her colonoscopy at [facility] on 04/13/2018 at 11:30;

## 2018-03-21 NOTE — Patient Instructions (Signed)
Continue on the alendronate for your bones.  We will plan on repeating your bone density next year.

## 2018-03-21 NOTE — Telephone Encounter (Signed)
Left message on machine to call back  

## 2018-03-21 NOTE — Progress Notes (Signed)
    DESTRY BEZDEK Jul 31, 1940 [id number]        78 y.o.  G6P6 for breast and pelvic exam.  Without gynecologic complaints  Past medical history,surgical history, problem list, medications, allergies, family history and social history were all reviewed and documented as reviewed in the EPIC chart.  ROS:  Performed with pertinent positives and negatives included in the history, assessment and plan.   Additional significant findings : None   Exam: Caryn Bee assistant Vitals:   03/21/18 1526  BP: 122/82  Weight: 116 lb (52.6 kg)  Height: 5\' 2"  (1.575 m)   Body mass index is 21.22 kg/m.  General appearance:  Normal affect, orientation and appearance. Skin: Grossly normal HEENT: Without gross lesions.  No cervical or supraclavicular adenopathy. Thyroid normal.  Lungs:  Clear without wheezing, rales or rhonchi Cardiac: RR, without RMG Abdominal:  Soft, nontender, without masses, guarding, rebound, organomegaly or hernia Breasts:  Examined lying and sitting without masses, retractions, discharge or axillary adenopathy. Pelvic:  Ext, BUS, Vagina: With atrophic changes  Cervix: With atrophic changes  Uterus: Anteverted, normal size, shape and contour, midline and mobile nontender   Adnexa: Without masses or tenderness    Anus and perineum: Normal   Rectovaginal: Normal sphincter tone without palpated masses or tenderness.    Assessment/Plan:  78 y.o. G90P6 female for breast and pelvic exam.   1. Postmenopausal.  No significant menopausal symptoms or any vaginal bleeding. 2. Osteoporosis.  DEXA 2017 T score -2.9.  Started taking alendronate last year.  Doing well with this.  We will continue this coming year and refill x1 year provided.  Plan repeat DEXA next year after being on alendronate for 2 years in a row. 3. Mammography 2018.  Recommended repeat mammogram now and she is going to schedule.  Breast exam normal today. 4. Pap smear 2019.  No Pap smear done today.  No history of  abnormal Pap smears previously.  Discussed current screening guidelines and options to stop screening based on age reviewed. 5. Colonoscopy is in the process of being scheduled and she will follow-up for this. 6. Health maintenance.  No routine lab work done as patient does this elsewhere.  Follow-up 1 year, sooner as needed.   Anastasio Auerbach MD, 4:05 PM 03/21/2018

## 2018-03-21 NOTE — Telephone Encounter (Signed)
Called and spoke with patient- patient informed of MD recommendations; patient is agreeable with plan of care for medication and proceeding with colonoscopy;  Patient was adamant of not using the Miralax as the prep-patient requests using pills "because they cleaned me out really well last time and I am not doing a prep unless it is the pills"; patient was advised a message would be sent to the MD and the patient would be contacted as soon as a response is known; patient agreed with this plan; Patient verbalized understanding of information/instructions; Patient was advised to call back if questions/concerns arise;

## 2018-03-21 NOTE — Telephone Encounter (Signed)
Due to significant side effects and problems we do not use Visicol as preparation. MiraLAX would be the easiest one.  Besides, we will looking for microscopic colitis.  Other preps may cause some problems.  She can put MiraLAX in water/tea or Gatorade and would not know that she is drinking it.  She will be given Dulcolax beforehand.

## 2018-03-22 NOTE — Telephone Encounter (Signed)
Patient returned call to office and requested her colonoscopy procedure date be changed to sooner; patient has been rescheduled for her colonoscopy at [facility] on 04/06/2018 appt at 3:00 pm; patient is still aware of her pre visit appt on 03/28/2018 arrival at 1:45 pm for a 2:00 pm appt at the [place] office;   Patient verbalized understanding of information/instructions; Patient was advised to call back if questions/concerns arise;

## 2018-03-24 ENCOUNTER — Inpatient Hospital Stay (HOSPITAL_BASED_OUTPATIENT_CLINIC_OR_DEPARTMENT_OTHER)
Admission: EM | Admit: 2018-03-24 | Discharge: 2018-03-27 | DRG: 440 | Disposition: A | Discharge status: Home / Self Care | Payer: Medicare Other | Attending: Internal Medicine | Admitting: Internal Medicine

## 2018-03-24 ENCOUNTER — Encounter (HOSPITAL_BASED_OUTPATIENT_CLINIC_OR_DEPARTMENT_OTHER): Payer: Self-pay | Admitting: *Deleted

## 2018-03-24 ENCOUNTER — Other Ambulatory Visit: Payer: Self-pay

## 2018-03-24 ENCOUNTER — Emergency Department (HOSPITAL_BASED_OUTPATIENT_CLINIC_OR_DEPARTMENT_OTHER): Payer: Medicare Other

## 2018-03-24 DIAGNOSIS — Z888 Allergy status to other drugs, medicaments and biological substances status: Secondary | ICD-10-CM | POA: Diagnosis not present

## 2018-03-24 DIAGNOSIS — E039 Hypothyroidism, unspecified: Secondary | ICD-10-CM | POA: Diagnosis not present

## 2018-03-24 DIAGNOSIS — K08409 Partial loss of teeth, unspecified cause, unspecified class: Secondary | ICD-10-CM | POA: Diagnosis present

## 2018-03-24 DIAGNOSIS — K219 Gastro-esophageal reflux disease without esophagitis: Secondary | ICD-10-CM | POA: Diagnosis present

## 2018-03-24 DIAGNOSIS — Z8711 Personal history of peptic ulcer disease: Secondary | ICD-10-CM | POA: Diagnosis not present

## 2018-03-24 DIAGNOSIS — K852 Alcohol induced acute pancreatitis without necrosis or infection: Secondary | ICD-10-CM | POA: Diagnosis not present

## 2018-03-24 DIAGNOSIS — N3941 Urge incontinence: Secondary | ICD-10-CM | POA: Diagnosis present

## 2018-03-24 DIAGNOSIS — J432 Centrilobular emphysema: Secondary | ICD-10-CM | POA: Diagnosis present

## 2018-03-24 DIAGNOSIS — G629 Polyneuropathy, unspecified: Secondary | ICD-10-CM | POA: Diagnosis present

## 2018-03-24 DIAGNOSIS — I10 Essential (primary) hypertension: Secondary | ICD-10-CM | POA: Diagnosis not present

## 2018-03-24 DIAGNOSIS — Z79899 Other long term (current) drug therapy: Secondary | ICD-10-CM

## 2018-03-24 DIAGNOSIS — F988 Other specified behavioral and emotional disorders with onset usually occurring in childhood and adolescence: Secondary | ICD-10-CM | POA: Diagnosis present

## 2018-03-24 DIAGNOSIS — Z79890 Hormone replacement therapy (postmenopausal): Secondary | ICD-10-CM | POA: Diagnosis not present

## 2018-03-24 DIAGNOSIS — E860 Dehydration: Secondary | ICD-10-CM | POA: Diagnosis present

## 2018-03-24 DIAGNOSIS — Z885 Allergy status to narcotic agent status: Secondary | ICD-10-CM | POA: Diagnosis not present

## 2018-03-24 DIAGNOSIS — Z981 Arthrodesis status: Secondary | ICD-10-CM | POA: Diagnosis not present

## 2018-03-24 DIAGNOSIS — E876 Hypokalemia: Secondary | ICD-10-CM | POA: Diagnosis present

## 2018-03-24 DIAGNOSIS — F1021 Alcohol dependence, in remission: Secondary | ICD-10-CM | POA: Diagnosis present

## 2018-03-24 DIAGNOSIS — Z8249 Family history of ischemic heart disease and other diseases of the circulatory system: Secondary | ICD-10-CM

## 2018-03-24 DIAGNOSIS — Z880 Allergy status to penicillin: Secondary | ICD-10-CM | POA: Diagnosis not present

## 2018-03-24 DIAGNOSIS — Z96642 Presence of left artificial hip joint: Secondary | ICD-10-CM | POA: Diagnosis present

## 2018-03-24 DIAGNOSIS — Z85828 Personal history of other malignant neoplasm of skin: Secondary | ICD-10-CM

## 2018-03-24 DIAGNOSIS — Z7982 Long term (current) use of aspirin: Secondary | ICD-10-CM | POA: Diagnosis not present

## 2018-03-24 DIAGNOSIS — K859 Acute pancreatitis without necrosis or infection, unspecified: Secondary | ICD-10-CM | POA: Diagnosis not present

## 2018-03-24 DIAGNOSIS — Z818 Family history of other mental and behavioral disorders: Secondary | ICD-10-CM

## 2018-03-24 DIAGNOSIS — R109 Unspecified abdominal pain: Secondary | ICD-10-CM | POA: Diagnosis not present

## 2018-03-24 DIAGNOSIS — R001 Bradycardia, unspecified: Secondary | ICD-10-CM | POA: Diagnosis not present

## 2018-03-24 DIAGNOSIS — Z87891 Personal history of nicotine dependence: Secondary | ICD-10-CM

## 2018-03-24 DIAGNOSIS — J439 Emphysema, unspecified: Secondary | ICD-10-CM | POA: Diagnosis not present

## 2018-03-24 DIAGNOSIS — F329 Major depressive disorder, single episode, unspecified: Secondary | ICD-10-CM | POA: Diagnosis present

## 2018-03-24 LAB — COMPREHENSIVE METABOLIC PANEL
ALT: 36 U/L (ref 0–44)
AST: 48 U/L — ABNORMAL HIGH (ref 15–41)
Albumin: 4.4 g/dL (ref 3.5–5.0)
Alkaline Phosphatase: 57 U/L (ref 38–126)
Anion gap: 10 (ref 5–15)
BUN: 19 mg/dL (ref 8–23)
CO2: 20 mmol/L — ABNORMAL LOW (ref 22–32)
Calcium: 10 mg/dL (ref 8.9–10.3)
Chloride: 109 mmol/L (ref 98–111)
Creatinine, Ser: 0.97 mg/dL (ref 0.44–1.00)
GFR calc Af Amer: >60 mL/min (ref >60)
GFR calc non Af Amer: 56 mL/min — ABNORMAL LOW (ref >60)
GLUCOSE: 198 mg/dL — AB (ref 70–99)
Potassium: 3.4 mmol/L — ABNORMAL LOW (ref 3.5–5.1)
Sodium: 139 mmol/L (ref 135–145)
Total Bilirubin: 0.7 mg/dL (ref 0.3–1.2)
Total Protein: 7.1 g/dL (ref 6.5–8.1)

## 2018-03-24 LAB — PROTIME-INR
INR: 1.01
Prothrombin Time: 13.2 seconds (ref 11.4–15.2)

## 2018-03-24 LAB — CBC WITH DIFFERENTIAL/PLATELET
Abs Immature Granulocytes: 0.07 10*3/uL (ref 0.00–0.07)
Basophils Absolute: 0 10*3/uL (ref 0.0–0.1)
Basophils Relative: 0 %
EOS ABS: 0 10*3/uL (ref 0.0–0.5)
Eosinophils Relative: 0 %
HCT: 39.4 % (ref 36.0–46.0)
Hemoglobin: 12.5 g/dL (ref 12.0–15.0)
IMMATURE GRANULOCYTES: 0 %
Lymphocytes Relative: 13 %
Lymphs Abs: 2.5 10*3/uL (ref 0.7–4.0)
MCH: 32.1 pg (ref 26.0–34.0)
MCHC: 31.7 g/dL (ref 30.0–36.0)
MCV: 101 fL — ABNORMAL HIGH (ref 80.0–100.0)
Monocytes Absolute: 0.8 10*3/uL (ref 0.1–1.0)
Monocytes Relative: 4 %
Neutro Abs: 15.9 10*3/uL — ABNORMAL HIGH (ref 1.7–7.7)
Neutrophils Relative %: 83 %
Platelets: 241 10*3/uL (ref 150–400)
RBC: 3.9 MIL/uL (ref 3.87–5.11)
RDW: 13.1 % (ref 11.5–15.5)
WBC: 19.3 10*3/uL — ABNORMAL HIGH (ref 4.0–10.5)
nRBC: 0 % (ref 0.0–0.2)

## 2018-03-24 LAB — URINALYSIS, ROUTINE W REFLEX MICROSCOPIC
Bilirubin Urine: NEGATIVE
Glucose, UA: NEGATIVE mg/dL
Hgb urine dipstick: NEGATIVE
KETONES UR: NEGATIVE mg/dL
LEUKOCYTES UA: NEGATIVE
Nitrite: NEGATIVE
Protein, ur: NEGATIVE mg/dL
Specific Gravity, Urine: <1.005 — ABNORMAL LOW (ref 1.005–1.030)
pH: 6 (ref 5.0–8.0)

## 2018-03-24 LAB — LIPASE, BLOOD: Lipase: 6275 U/L — ABNORMAL HIGH (ref 11–51)

## 2018-03-24 LAB — TROPONIN I: Troponin I: <0.03 ng/mL (ref <0.03)

## 2018-03-24 LAB — I-STAT CG4 LACTIC ACID, ED
Lactic Acid, Venous: 2.33 mmol/L (ref 0.5–1.9)
Lactic Acid, Venous: 1.03 mmol/L (ref 0.5–1.9)

## 2018-03-24 MED ORDER — FENTANYL CITRATE (PF) 100 MCG/2ML IJ SOLN
75.0000 ug | Freq: Once | INTRAMUSCULAR | Status: AC
  Administered 2018-03-24: 75 ug via INTRAVENOUS
  Filled 2018-03-24: qty 2

## 2018-03-24 MED ORDER — FENTANYL CITRATE (PF) 100 MCG/2ML IJ SOLN
75.0000 ug | Freq: Once | INTRAMUSCULAR | Status: AC
  Administered 2018-03-24: 75 ug via INTRAVENOUS

## 2018-03-24 MED ORDER — SODIUM CHLORIDE 0.9 % IV SOLN
INTRAVENOUS | Status: DC
  Administered 2018-03-24: 19:00:00 via INTRAVENOUS

## 2018-03-24 MED ORDER — IOPAMIDOL (ISOVUE-370) INJECTION 76%
100.0000 mL | Freq: Once | INTRAVENOUS | Status: AC | PRN
  Administered 2018-03-24: 100 mL via INTRAVENOUS

## 2018-03-24 MED ORDER — FENTANYL CITRATE (PF) 100 MCG/2ML IJ SOLN
50.0000 ug | Freq: Once | INTRAMUSCULAR | Status: AC
  Administered 2018-03-24: 50 ug via INTRAVENOUS
  Filled 2018-03-24: qty 2

## 2018-03-24 MED ORDER — SODIUM CHLORIDE 0.9 % IV SOLN
1000.0000 mL | INTRAVENOUS | Status: DC
  Administered 2018-03-24: 1000 mL via INTRAVENOUS

## 2018-03-24 MED ORDER — FENTANYL CITRATE (PF) 100 MCG/2ML IJ SOLN
INTRAMUSCULAR | Status: AC
  Administered 2018-03-24: 100 ug via INTRAVENOUS
  Filled 2018-03-24: qty 2

## 2018-03-24 MED ORDER — FENTANYL CITRATE (PF) 100 MCG/2ML IJ SOLN
50.0000 ug | Freq: Once | INTRAMUSCULAR | Status: AC
  Administered 2018-03-24: 100 ug via INTRAVENOUS

## 2018-03-24 MED ORDER — ONDANSETRON HCL 4 MG/2ML IJ SOLN
4.0000 mg | Freq: Once | INTRAMUSCULAR | Status: AC
  Administered 2018-03-24: 4 mg via INTRAVENOUS

## 2018-03-24 MED ORDER — SODIUM CHLORIDE 0.9 % IV BOLUS
1000.0000 mL | Freq: Once | INTRAVENOUS | Status: DC
  Administered 2018-03-24: 1000 mL via INTRAVENOUS

## 2018-03-24 MED ORDER — FENTANYL CITRATE (PF) 100 MCG/2ML IJ SOLN
INTRAMUSCULAR | Status: AC
  Filled 2018-03-24: qty 2

## 2018-03-24 MED ORDER — ONDANSETRON HCL 4 MG/2ML IJ SOLN
INTRAMUSCULAR | Status: AC
  Filled 2018-03-24: qty 2

## 2018-03-24 MED ORDER — SODIUM CHLORIDE 0.9 % IV BOLUS
1000.0000 mL | Freq: Once | INTRAVENOUS | Status: AC
  Administered 2018-03-24: 1000 mL via INTRAVENOUS

## 2018-03-24 MED ORDER — SODIUM CHLORIDE 0.9 % IV BOLUS (SEPSIS)
500.0000 mL | Freq: Once | INTRAVENOUS | Status: AC
  Administered 2018-03-24: 500 mL via INTRAVENOUS

## 2018-03-24 NOTE — ED Provider Notes (Signed)
[facility] EMERGENCY DEPARTMENT Provider Note   CSN: [id number] Arrival date & time: 03/24/18  1732     History   Chief Complaint Chief Complaint  Patient presents with  . Abdominal Pain    HPI Rose [name] is a 78 y.o. female.  HPI Patient presented to the emergency room for the evaluation of severe abdominal pain.  Patient states she started having pain in her abdomen earlier today.  Throughout the day however the pain increased in intensity and severity.  Right now the pain is severe and it is in the middle of her abdomen and somewhat below her like is.  She has felt nauseated and vomited.  She has been diaphoretic and weak.  He denies any dysuria.  No chest pain.  No vomiting of blood.  No blood in her stool.  She has history of an appendectomy as a child. Past Medical History:  Diagnosis Date  . Abnormal blood pressure    left arm from old brachial artery repair  . Anemia   . Anxiety   . Atherosclerosis of renal artery ([place])   . Attention deficit disorder without mention of hyperactivity   . Basal cell carcinoma of face   . Benign heart murmur   . Carotid artery disease ([place])   . Carotid bruit    Left  . Centrilobular emphysema ([place])   . Chronic bronchitis ([place])   . Depression   . DJD (degenerative joint disease) of hip   . Esophageal reflux   . History of depression   . History of spinal fusion   . Hypertension   . Morton's neuroma    Hx of, Left  . Myalgia and myositis, unspecified   . Other tenosynovitis of hand and wrist   . Peripheral neuropathy   . Personal history of alcoholism ([place])   . Personal history of peptic ulcer disease   . Pulmonary nodule   . Rotator cuff disorder    pain  . Solitary cyst of breast   . Subclavian steal syndrome   . Unspecified hypothyroidism   . Urinary incontinence   . Wears glasses     Patient Active Problem List   Diagnosis Date Noted  . Acute alcoholic pancreatitis 29/93/7169  . Left hip pain  07/27/2016  . Tensor fascia lata syndrome 03/04/2015  . Leg length discrepancy 10/29/2014  . Fecal incontinence 10/21/2014  . Muscle atrophy 06/18/2014  . Arthritis of left shoulder region 06/18/2014  . ADD (attention deficit disorder) 09/25/2013  . Encounter for Medicare annual wellness exam 08/02/2013  . Screening for osteoporosis 08/02/2013  . Depression 07/19/2013  . Hypothyroidism 07/19/2013  . Lung mass 07/19/2013  . COPD (chronic obstructive pulmonary disease) ([place]) 07/19/2013  . OTHER TENOSYNOVITIS OF HAND AND WRIST 11/26/2009  . Hereditary and idiopathic peripheral neuropathy 11/25/2009  . CAROTID ARTERY DISEASE 09/21/2008  . ALCOHOL ABUSE, HX OF 09/21/2008  . SUBCLAVIAN STEAL SYNDROME 09/20/2008  . Pain due to total hip replacement ([place]) 01/23/2008  . RENAL ARTERY STENOSIS 10/11/2007  . Malignant neoplasm of skin of parts of face 02/21/2007  . MORTON'S NEUROMA, LEFT 02/21/2007  . BREAST CYST 02/21/2007  . CAROTID BRUIT, LEFT 02/21/2007  . DUODENAL ULCER, HX OF 02/21/2007  . Other acquired absence of organ 02/21/2007  . CARCINOMA, BASAL CELL, FACE 02/21/2007  . Essential hypertension 08/09/2006  . GERD 08/09/2006  . HEART MURMUR, BENIGN 08/09/2006    Past Surgical History:  Procedure Laterality Date  . APPENDECTOMY    .  CARPOMETACARPEL SUSPENSION PLASTY Right 08/07/2013   Procedure: CARPOMETACARPEL [facility]) SUSPENSION PLASTY RIGHT THUMB;  Surgeon: Wynonia Sours, MD;  Location: [facility];  Service: Orthopedics;  Laterality: Right;  . CARPOMETACARPEL SUSPENSION PLASTY Left 10/23/2015   Procedure: SUSPENSION PLASTY LEFT THUMB TRAPEZIUM EXCISION ABDUCTOR POLLICIS LONGUS TRANSFER;  Surgeon: Daryll Brod, MD;  Location: [facility];  Service: Orthopedics;  Laterality: Left;  . CERVICAL FUSION  2007   Dr. Valli Glance approach  . JOINT REPLACEMENT Left   . Left brachial artery repair of pseudoaneurysm  2001   post a cath  . LUMBAR FUSION  09/2004     T12-L5 (Dr. Patrice Paradise)  . MINOR IRRIGATION AND DEBRIDEMENT OF WOUND Right 08/27/2014   Procedure: MINOR IRRIGATION AND DEBRIDEMENT OF WOUND;  Surgeon: Daryll Brod, MD;  Location: [facility];  Service: Orthopedics;  Laterality: Right;  . ORIF TIBIA FRACTURE  2010   Left distal  . Percutanous Transluminal Angioplasty of Renal Arteris    . REPAIR EXTENSOR TENDON Right 09/26/2014   Procedure: REPAIR EXTENSOR TENDON RIGHT RING FINGER ;  Surgeon: Daryll Brod, MD;  Location: [facility];  Service: Orthopedics;  Laterality: Right;  . SHOULDER ARTHROSCOPY  09/2008   Left  . TONSILLECTOMY AND ADENOIDECTOMY    . TOTAL HIP ARTHROPLASTY  2008   left  . WISDOM TOOTH EXTRACTION       OB History    Gravida  6   Para  6   Term      Preterm      AB      Living  4     SAB      TAB      Ectopic      Multiple      Live Births               Home Medications    Prior to Admission medications   Medication Sig Start Date End Date Taking? Authorizing Provider  alendronate (FOSAMAX) 70 MG tablet TAKE 1 TABLET BY MOUTH ONCE WEEKLY BEFORE BREAKFAST, ON AN EMPTY STOMACH: REMAIN UPRIGHT FOR 30 MINUTES:TAKE WITH 8 OUNCES OF WATER 03/21/18   Fontaine, Belinda Block, MD  amLODipine (NORVASC) 5 MG tablet TAKE ONE TABLET BY MOUTH TWICE A DAY 03/16/18   Saguier, Percell Miller, PA-C  aspirin 81 MG tablet Take 81 mg by mouth daily.     [provider]  atomoxetine (STRATTERA) 100 MG capsule Take 100 mg by mouth daily.    [provider]  BuPROPion HCl (WELLBUTRIN PO) Take by mouth.    [provider]  cholestyramine (QUESTRAN) 4 g packet Take 1 packet (4 g total) by mouth 2 (two) times daily. Must take medication 2 hours before or after other medications; 03/02/18   Jackquline Denmark, MD  diphenoxylate-atropine (LOMOTIL) 2.5-0.025 MG tablet Take 1 tablet by mouth 3 (three) times daily as needed for diarrhea or loose stools. 02/13/18   Jackquline Denmark, MD  DULoxetine  (CYMBALTA) 60 MG capsule TAKE ONE CAPSULE BY MOUTH DAILY 08/27/15   Brunetta Jeans, PA-C  ferrous sulfate dried (SLOW FE) 160 (50 FE) MG TBCR Take 160 mg by mouth 2 (two) times daily. Patient has been taking Once Daily    [provider]  Glucosamine 500 MG TABS Take 1 tablet by mouth 2 (two) times daily. Glucosamine-Chondronton    [provider]  hydrochlorothiazide (MICROZIDE) 12.5 MG capsule TAKE ONE CAPSULE BY MOUTH DAILY AS NEEDED 02/14/18  Saguier, Percell Miller, PA-C  Levothyroxine Sodium 125 MCG CAPS 1 tab po q day 02/14/18   Saguier, Percell Miller, PA-C  losartan (COZAAR) 100 MG tablet TAKE ONE TABLET BY MOUTH DAILY 02/17/18   Saguier, Percell Miller, PA-C  OLANZapine (ZYPREXA) 5 MG tablet Take 15 mg by mouth at bedtime.     [provider]  oxybutynin (DITROPAN-XL) 10 MG 24 hr tablet Take 1 tablet (10 mg total) by mouth at bedtime. 03/18/17   Fontaine, Belinda Block, MD  zolpidem (AMBIEN) 5 MG tablet Take 1 tablet (5 mg total) by mouth at bedtime as needed for sleep. Patient not taking: Reported on 03/21/2018 12/12/17   Saguier, Percell Miller, PA-C    Family History Family History  Adopted: Yes  Problem Relation Age of Onset  . Schizophrenia Son        Deceased 59  . Hypertension Daughter        Renal  . Healthy Son        x1  . Healthy Daughter        x2    Social History Social History   Tobacco Use  . Smoking status: Former Smoker    Packs/day: 1.00    Years: 25.00    Pack years: 25.00    Types: Cigarettes    Last attempt to quit: 03/16/1983    Years since quitting: 35.0  . Smokeless tobacco: Never Used  Substance Use Topics  . Alcohol use: Yes    Comment: 1-2 glasses wine daily  . Drug use: No     Allergies   Amoxicillin; Codeine; Hydrocodone-acetaminophen; Hydromorphone hcl; Morphine and related; Morphine sulfate; and Nsaids   Review of Systems Review of Systems  All other systems reviewed and are negative.    Physical Exam Updated Vital Signs BP (!)  164/70   Pulse 88   Temp (!) 97.4 F (36.3 C) (Oral)   Resp (!) 22   Ht 1.575 m (5\' 2" )   Wt 52.6 kg   SpO2 100%   BMI 21.21 kg/m   Physical Exam Vitals signs and nursing note reviewed.  Constitutional:      General: She is in acute distress.     Appearance: She is ill-appearing.  HENT:     Head: Normocephalic and atraumatic.     Right Ear: External ear normal.     Left Ear: External ear normal.  Eyes:     General: No scleral icterus.       Right eye: No discharge.        Left eye: No discharge.     Conjunctiva/sclera: Conjunctivae normal.  Neck:     Musculoskeletal: Neck supple.     Trachea: No tracheal deviation.  Cardiovascular:     Rate and Rhythm: Normal rate and regular rhythm.  Pulmonary:     Effort: Pulmonary effort is normal. No respiratory distress.     Breath sounds: Normal breath sounds. No stridor. No wheezing or rales.  Abdominal:     General: Bowel sounds are normal. There is no distension.     Palpations: Abdomen is soft. There is no mass.     Tenderness: There is abdominal tenderness in the epigastric area and periumbilical area. There is no guarding or rebound.     Hernia: No hernia is present.  Musculoskeletal:        General: No tenderness.  Skin:    General: Skin is warm and dry.     Findings: No rash.  Neurological:     Mental Status:  She is alert.     Cranial Nerves: No cranial nerve deficit (no facial droop, extraocular movements intact, no slurred speech).     Sensory: No sensory deficit.     Motor: No abnormal muscle tone or seizure activity.     Coordination: Coordination normal.      ED Treatments / Results  Labs (all labs ordered are listed, but only abnormal results are displayed) Labs Reviewed  COMPREHENSIVE METABOLIC PANEL - Abnormal; Notable for the following components:      Result Value   Potassium 3.4 (*)    CO2 20 (*)    Glucose, Bld 198 (*)    AST 48 (*)    GFR calc non Af Amer 56 (*)    All other components within  normal limits  LIPASE, BLOOD - Abnormal; Notable for the following components:   Lipase 6,275 (*)    All other components within normal limits  CBC WITH DIFFERENTIAL/PLATELET - Abnormal; Notable for the following components:   WBC 19.3 (*)    MCV 101.0 (*)    Neutro Abs 15.9 (*)    All other components within normal limits  URINALYSIS, ROUTINE W REFLEX MICROSCOPIC - Abnormal; Notable for the following components:   Specific Gravity, Urine <1.005 (*)    All other components within normal limits  I-STAT CG4 LACTIC ACID, ED - Abnormal; Notable for the following components:   Lactic Acid, Venous 2.33 (*)    All other components within normal limits  TROPONIN I  PROTIME-INR  I-STAT CG4 LACTIC ACID, ED    EKG EKG Interpretation  Date/Time:  Friday March 24 2018 17:50:46 EST Ventricular Rate:  45 PR Interval:    QRS Duration: 85 QT Interval:  494 QTC Calculation: 428 R Axis:   71 Text Interpretation:  Sinus bradycardia ST elevation, consider inferior injury Poor data quality nonspecific t wave changes since prior tracing Confirmed by Dorie Rank [phone]) on 03/24/2018 5:54:36 PM   Radiology Ct Angio Chest/abd/pel For Dissection W And/or Wo Contrast  Result Date: 03/24/2018 CLINICAL DATA:  Acute abdominal pain. EXAM: CT ANGIOGRAPHY CHEST, ABDOMEN AND PELVIS TECHNIQUE: Multidetector CT imaging through the chest, abdomen and pelvis was performed using the standard protocol during bolus administration of intravenous contrast. Multiplanar reconstructed images and MIPs were obtained and reviewed to evaluate the vascular anatomy. CONTRAST:  140mL ISOVUE-370 IOPAMIDOL (ISOVUE-370) INJECTION 76% COMPARISON:  CT abdomen pelvis dated February 07, 2018. PET-CT dated August 14, 2013. FINDINGS: CTA CHEST FINDINGS Cardiovascular: Preferential opacification of the thoracic aorta. No evidence of thoracic aortic aneurysm or dissection. No intramural hematoma. Coronary, aortic arch, and branch vessel  atherosclerotic vascular disease. High-grade stenosis and occlusion of the mid to distal left subclavian artery with distal reconstitution of the left axillary artery secondary to collaterals. Normal heart size. No pericardial effusion. No central pulmonary embolism. Mediastinum/Nodes: No enlarged mediastinal, hilar, or axillary lymph nodes. Thyroid gland and trachea demonstrate no significant findings. Mild esophageal distention. Lungs/Pleura: Mild centrilobular emphysema. No focal consolidation, pleural effusion, or pneumothorax. Unchanged calcified granuloma in the left lower lobe. Musculoskeletal: No chest wall abnormality. No acute or significant osseous findings. Review of the MIP images confirms the above findings. CTA ABDOMEN AND PELVIS FINDINGS VASCULAR Aorta: Normal caliber aorta without aneurysm, dissection, vasculitis or significant stenosis. Moderate atherosclerosis. Celiac: Patent with moderate stenosis of the origin. No aneurysm, dissection, or vasculitis. SMA: Patent without evidence of aneurysm, dissection, vasculitis or significant stenosis. Renals: The left renal artery is patent. High-grade stenosis of the proximal  right renal artery. No aneurysm, dissection, vasculitis, or fibromuscular dysplasia. IMA: Patent with moderate stenosis of the origin. No aneurysm, dissection, or vasculitis. Inflow: Patent without evidence of aneurysm, dissection, vasculitis or significant stenosis. Veins: No obvious venous abnormality within the limitations of this arterial phase study. Review of the MIP images confirms the above findings. NON-VASCULAR Hepatobiliary: Unchanged simple cyst in the right hepatic lobe. No other focal liver abnormality. Mild gallbladder distention. No gallbladder wall thickening or gallstones. Unchanged mild prominence of the central intrahepatic ducts. No common bile duct dilatation. Pancreas: Mild pancreatic enlargement with slight parenchymal heterogeneity. Prominent peripancreatic  fluid in the lesser sac. No ductal dilatation. No necrosis or peripancreatic fluid collection. Spleen: Normal in size without focal abnormality. Unchanged small calcified granulomas. Adrenals/Urinary Tract: The adrenal glands are unremarkable. Unchanged left renal cysts. Unchanged moderate to severe right renal atrophy. No renal calculi or hydronephrosis. The bladder is unremarkable for the degree of distention. Stomach/Bowel: Small hiatal hernia. The stomach is otherwise within normal limits. No bowel wall thickening, distention, or surrounding inflammatory changes. Mild sigmoid diverticulosis. The appendix is not identified in this patient with a history of prior appendectomy. Lymphatic: No enlarged abdominal or pelvic lymph nodes. Reproductive: Uterus and bilateral adnexa are unremarkable for the patient's age. Other: No abdominal wall hernia or abnormality. No pneumoperitoneum. Musculoskeletal: No acute or significant osseous findings. Prior lumbar fusion and left hip total arthroplasty. Unchanged sacral Tarlov cysts. Review of the MIP images confirms the above findings. IMPRESSION: Abdomen and pelvis: 1. Acute pancreatitis. No necrosis or peripancreatic fluid collection. Vascular: 1. No evidence of thoracoabdominal aortic aneurysm or dissection. 2. High-grade stenosis and occlusion of the mid to distal left subclavian artery with distal reconstitution of the left axillary artery. 3. Moderate stenosis of the celiac and inferior mesenteric artery origins. 4. High-grade stenosis of the proximal right renal artery with resultant moderate to severe right renal atrophy. 5.  Aortic atherosclerosis (ICD10-I70.0). Chest: 1.  No acute intrathoracic process. 2.  Emphysema (ICD10-J43.9). Electronically Signed   By: Titus Dubin M.D.   On: 03/24/2018 19:10    Procedures Ultrasound ED Abd Date/Time: 03/24/2018 7:40 PM Performed by: Dorie Rank, MD Authorized by: Dorie Rank, MD   Procedure details:    Indications:  abdominal pain     Assessment for:  AAA   Aorta:  Visualized   Images: archived    Vascular findings:    Aorta: aorta normal (< 3cm)     Intra-abdominal fluid: unidentified   .Critical Care Performed by: Dorie Rank, MD Authorized by: Dorie Rank, MD   Critical care provider statement:    Critical care time (minutes):  45   Critical care was time spent personally by me on the following activities:  Discussions with consultants, evaluation of patient's response to treatment, examination of patient, ordering and performing treatments and interventions, ordering and review of laboratory studies, ordering and review of radiographic studies, pulse oximetry, re-evaluation of patient's condition, obtaining history from patient or surrogate and review of old charts   (including critical care time)  Medications Ordered in ED Medications  sodium chloride 0.9 % bolus 500 mL (0 mLs Intravenous Stopped 03/24/18 1837)    Followed by  0.9 %  sodium chloride infusion (1,000 mLs Intravenous New Bag/Given 03/24/18 1855)  fentaNYL (SUBLIMAZE) injection 50 mcg (100 mcg Intravenous Given 03/24/18 1805)  ondansetron (ZOFRAN) injection 4 mg (4 mg Intravenous Given 03/24/18 1806)  iopamidol (ISOVUE-370) 76 % injection 100 mL (100 mLs Intravenous Contrast Given 03/24/18 1820)  sodium chloride 0.9 % bolus 1,000 mL (0 mLs Intravenous Stopped 03/24/18 1945)  fentaNYL (SUBLIMAZE) injection 50 mcg (50 mcg Intravenous Given 03/24/18 1950)     Initial Impression / Assessment and Plan / ED Course  I have reviewed the triage vital signs and the nursing notes.  Pertinent labs & imaging results that were available during my care of the patient were reviewed by me and considered in my medical decision making (see chart for details).  Clinical Course as of Mar 24 2020  Fri Mar 24, 2018  2021 Discussed with DR Alcario Drought.  Will admit to [facility]   [JK]    Clinical Course User Index [JK] Dorie Rank, MD  Patient presented to the  emergency room with severe abdominal pain.  Patient was in significant distress when she first arrived.  She was actively vomiting.  Initial blood pressure was low although this may have been inaccurate.  Subsequent blood pressures have been normal.  Lactic acid is elevated however think this is most likely related to dehydration and not sepsis.  Laboratory tests are notable for a leukocytosis associated with a significant elevation in her lipase.  This is consistent with acute pancreatitis.  CT scan confirms that finding and there is no evidence of AAA.  It also does not show any signs of cholecystitis.  Patient admits to daily alcohol use.  Is possible this could be the trigger for her.  We will consult with medical service for admission and further treatment.  Final Clinical Impressions(s) / ED Diagnoses   Final diagnoses:  Alcohol-induced acute pancreatitis, unspecified complication status      Dorie Rank, MD 03/24/18 2022

## 2018-03-24 NOTE — H&P (Signed)
History and Physical    Rose [name] WJX:[id number] DOB: 1940/03/23 DOA: 03/24/2018  PCP: Mackie Pai, PA-C  Patient coming from: home by way of [facility]   Chief Complaint: abd pain  HPI: Rose [name] is a 78 y.o. female with medical history significant for hypothyroidism, htn, who presents with above.  Was in her usual state of health when this afternoon developed relatively acute onset periumbilical pain, very sharp in nature. Did not radiate. Associated with some nausea and one episode nbnb emesis. No chest pain or SOB. Has had about a month of chronic occasional diarrhea for which she has seen her pcp and GI, but does not note increased diarrhea or changes in bowel habits the last few days. Denies history gallstones or pancreatitis; this is the first time this has happened. Drinks 1-2 glasses of wine a day, rarely if ever more. No use of illicit drugs. Pain much improved now after fentanyl  ED Course: iv fluids, imaging, labs  Review of Systems: As per HPI otherwise 10 point review of systems negative.    Past Medical History:  Diagnosis Date  . Abnormal blood pressure    left arm from old brachial artery repair  . Anemia   . Anxiety   . Atherosclerosis of renal artery ([place])   . Attention deficit disorder without mention of hyperactivity   . Basal cell carcinoma of face   . Benign heart murmur   . Carotid artery disease ([place])   . Carotid bruit    Left  . Centrilobular emphysema ([place])   . Chronic bronchitis ([place])   . Depression   . DJD (degenerative joint disease) of hip   . Esophageal reflux   . History of depression   . History of spinal fusion   . Hypertension   . Morton's neuroma    Hx of, Left  . Myalgia and myositis, unspecified   . Other tenosynovitis of hand and wrist   . Peripheral neuropathy   . Personal history of alcoholism ([place])   . Personal history of peptic ulcer disease   . Pulmonary nodule   . Rotator cuff disorder    pain  . Solitary cyst  of breast   . Subclavian steal syndrome   . Unspecified hypothyroidism   . Urinary incontinence   . Wears glasses     Past Surgical History:  Procedure Laterality Date  . APPENDECTOMY    . CARPOMETACARPEL SUSPENSION PLASTY Right 08/07/2013   Procedure: CARPOMETACARPEL [facility]) SUSPENSION PLASTY RIGHT THUMB;  Surgeon: Wynonia Sours, MD;  Location: [facility];  Service: Orthopedics;  Laterality: Right;  . CARPOMETACARPEL SUSPENSION PLASTY Left 10/23/2015   Procedure: SUSPENSION PLASTY LEFT THUMB TRAPEZIUM EXCISION ABDUCTOR POLLICIS LONGUS TRANSFER;  Surgeon: Daryll Brod, MD;  Location: [facility];  Service: Orthopedics;  Laterality: Left;  . CERVICAL FUSION  2007   Dr. Valli Glance approach  . JOINT REPLACEMENT Left   . Left brachial artery repair of pseudoaneurysm  2001   post a cath  . LUMBAR FUSION  09/2004   T12-L5 (Dr. Patrice Paradise)  . MINOR IRRIGATION AND DEBRIDEMENT OF WOUND Right 08/27/2014   Procedure: MINOR IRRIGATION AND DEBRIDEMENT OF WOUND;  Surgeon: Daryll Brod, MD;  Location: [facility];  Service: Orthopedics;  Laterality: Right;  . ORIF TIBIA FRACTURE  2010   Left distal  . Percutanous Transluminal Angioplasty of Renal Arteris    . REPAIR EXTENSOR TENDON Right 09/26/2014   Procedure: REPAIR EXTENSOR TENDON RIGHT RING  FINGER ;  Surgeon: Daryll Brod, MD;  Location: [place];  Service: Orthopedics;  Laterality: Right;  . SHOULDER ARTHROSCOPY  09/2008   Left  . TONSILLECTOMY AND ADENOIDECTOMY    . TOTAL HIP ARTHROPLASTY  2008   left  . WISDOM TOOTH EXTRACTION       reports that she quit smoking about 35 years ago. Her smoking use included cigarettes. She has a 25.00 pack-year smoking history. She has never used smokeless tobacco. She reports current alcohol use. She reports that she does not use drugs.  Allergies  Allergen Reactions  . Amoxicillin     REACTION: Rash  . Codeine Hives  . Hydrocodone-Acetaminophen      REACTION: itching  . Hydromorphone Hcl   . Morphine And Related Hives and Itching  . Morphine Sulfate     REACTION: unspecified  . Nsaids     Ulcer    Family History  Adopted: Yes  Problem Relation Age of Onset  . Schizophrenia Son        Deceased 82  . Hypertension Daughter        Renal  . Healthy Son        x1  . Healthy Daughter        x2    Prior to Admission medications   Medication Sig Start Date End Date Taking? Authorizing Provider  alendronate (FOSAMAX) 70 MG tablet TAKE 1 TABLET BY MOUTH ONCE WEEKLY BEFORE BREAKFAST, ON AN EMPTY STOMACH: REMAIN UPRIGHT FOR 30 MINUTES:TAKE WITH 8 OUNCES OF WATER 03/21/18   Fontaine, Belinda Block, MD  amLODipine (NORVASC) 5 MG tablet TAKE ONE TABLET BY MOUTH TWICE A DAY 03/16/18   Saguier, Percell Miller, PA-C  aspirin 81 MG tablet Take 81 mg by mouth daily.     [provider]  atomoxetine (STRATTERA) 100 MG capsule Take 100 mg by mouth daily.    [provider]  BuPROPion HCl (WELLBUTRIN PO) Take by mouth.    [provider]  cholestyramine (QUESTRAN) 4 g packet Take 1 packet (4 g total) by mouth 2 (two) times daily. Must take medication 2 hours before or after other medications; 03/02/18   Jackquline Denmark, MD  diphenoxylate-atropine (LOMOTIL) 2.5-0.025 MG tablet Take 1 tablet by mouth 3 (three) times daily as needed for diarrhea or loose stools. 02/13/18   Jackquline Denmark, MD  DULoxetine (CYMBALTA) 60 MG capsule TAKE ONE CAPSULE BY MOUTH DAILY 08/27/15   Brunetta Jeans, PA-C  ferrous sulfate dried (SLOW FE) 160 (50 FE) MG TBCR Take 160 mg by mouth 2 (two) times daily. Patient has been taking Once Daily    [provider]  Glucosamine 500 MG TABS Take 1 tablet by mouth 2 (two) times daily. Glucosamine-Chondronton    [provider]  hydrochlorothiazide (MICROZIDE) 12.5 MG capsule TAKE ONE CAPSULE BY MOUTH DAILY AS NEEDED 02/14/18   Saguier, Percell Miller, PA-C  Levothyroxine Sodium 125 MCG CAPS 1 tab po q day 02/14/18    Saguier, Percell Miller, PA-C  losartan (COZAAR) 100 MG tablet TAKE ONE TABLET BY MOUTH DAILY 02/17/18   Saguier, Percell Miller, PA-C  oxybutynin (DITROPAN-XL) 10 MG 24 hr tablet Take 1 tablet (10 mg total) by mouth at bedtime. 03/18/17   Anastasio Auerbach, MD    Physical Exam: Vitals:   03/24/18 2115 03/24/18 2130 03/24/18 2145 03/24/18 2253  BP: (!) 159/86 (!) 175/73 (!) 149/90 (!) 153/68  Pulse: 99 98 86 90  Resp: 16 (!) 21 19 20   Temp: 98 F (  36.7 C)   97.9 F (36.6 C)  TempSrc: Oral   Oral  SpO2: 97% 99% 100% 99%  Weight:    54 kg  Height:    5\' 2"  (1.575 m)    Constitutional: No acute distress Head: Atraumatic Eyes: Conjunctiva clear ENM: Moist mucous membranes. Normal dentition.  Neck: Supple Respiratory: Clear to auscultation bilaterally, no wheezing/rales/rhonchi. Normal respiratory effort. No accessory muscle use. . Cardiovascular: Regular rate and rhythm. No murmurs/rubs/gallops. Abdomen: diffuse mild ttp, worse periumbilical, non-distended. No masses. No rebound or guarding. Decreased bowel sounds. Musculoskeletal: No joint deformity upper and lower extremities. Normal ROM, no contractures. Normal muscle tone.  Skin: No rashes, lesions, or ulcers.  Extremities: No peripheral edema. Palpable peripheral pulses. Neurologic: Alert, moving all 4 extremities. Psychiatric: Normal insight and judgement.   Labs on Admission: I have personally reviewed following labs and imaging studies  CBC: Recent Labs  Lab 03/24/18 1757  WBC 19.3*  NEUTROABS 15.9*  HGB 12.5  HCT 39.4  MCV 101.0*  PLT 782   Basic Metabolic Panel: Recent Labs  Lab 03/24/18 1757  NA 139  K 3.4*  CL 109  CO2 20*  GLUCOSE 198*  BUN 19  CREATININE 0.97  CALCIUM 10.0   GFR: Estimated Creatinine Clearance: 37.2 mL/min (by C-G formula based on SCr of 0.97 mg/dL). Liver Function Tests: Recent Labs  Lab 03/24/18 1757  AST 48*  ALT 36  ALKPHOS 57  BILITOT 0.7  PROT 7.1  ALBUMIN 4.4   Recent Labs    Lab 03/24/18 1757  LIPASE 6,275*   No results for input(s): AMMONIA in the last 168 hours. Coagulation Profile: Recent Labs  Lab 03/24/18 1852  INR 1.01   Cardiac Enzymes: Recent Labs  Lab 03/24/18 1757  TROPONINI <0.03   BNP (last 3 results) No results for input(s): PROBNP in the last 8760 hours. HbA1C: No results for input(s): HGBA1C in the last 72 hours. CBG: No results for input(s): GLUCAP in the last 168 hours. Lipid Profile: No results for input(s): CHOL, HDL, LDLCALC, TRIG, CHOLHDL, LDLDIRECT in the last 72 hours. Thyroid Function Tests: No results for input(s): TSH, T4TOTAL, FREET4, T3FREE, THYROIDAB in the last 72 hours. Anemia Panel: No results for input(s): VITAMINB12, FOLATE, FERRITIN, TIBC, IRON, RETICCTPCT in the last 72 hours. Urine analysis:    Component Value Date/Time   COLORURINE YELLOW 03/24/2018 1856   APPEARANCEUR CLEAR 03/24/2018 1856   LABSPEC <1.005 (L) 03/24/2018 1856   PHURINE 6.0 03/24/2018 1856   GLUCOSEU NEGATIVE 03/24/2018 1856   GLUCOSEU NEGATIVE 04/21/2015 1319   HGBUR NEGATIVE 03/24/2018 1856   BILIRUBINUR NEGATIVE 03/24/2018 1856   KETONESUR NEGATIVE 03/24/2018 1856   PROTEINUR NEGATIVE 03/24/2018 1856   UROBILINOGEN 0.2 04/21/2015 1319   NITRITE NEGATIVE 03/24/2018 1856   LEUKOCYTESUR NEGATIVE 03/24/2018 1856    Radiological Exams on Admission: Ct Angio Chest/abd/pel For Dissection W And/or Wo Contrast  Result Date: 03/24/2018 CLINICAL DATA:  Acute abdominal pain. EXAM: CT ANGIOGRAPHY CHEST, ABDOMEN AND PELVIS TECHNIQUE: Multidetector CT imaging through the chest, abdomen and pelvis was performed using the standard protocol during bolus administration of intravenous contrast. Multiplanar reconstructed images and MIPs were obtained and reviewed to evaluate the vascular anatomy. CONTRAST:  149mL ISOVUE-370 IOPAMIDOL (ISOVUE-370) INJECTION 76% COMPARISON:  CT abdomen pelvis dated February 07, 2018. PET-CT dated August 14, 2013.  FINDINGS: CTA CHEST FINDINGS Cardiovascular: Preferential opacification of the thoracic aorta. No evidence of thoracic aortic aneurysm or dissection. No intramural hematoma. Coronary, aortic arch, and branch vessel  atherosclerotic vascular disease. High-grade stenosis and occlusion of the mid to distal left subclavian artery with distal reconstitution of the left axillary artery secondary to collaterals. Normal heart size. No pericardial effusion. No central pulmonary embolism. Mediastinum/Nodes: No enlarged mediastinal, hilar, or axillary lymph nodes. Thyroid gland and trachea demonstrate no significant findings. Mild esophageal distention. Lungs/Pleura: Mild centrilobular emphysema. No focal consolidation, pleural effusion, or pneumothorax. Unchanged calcified granuloma in the left lower lobe. Musculoskeletal: No chest wall abnormality. No acute or significant osseous findings. Review of the MIP images confirms the above findings. CTA ABDOMEN AND PELVIS FINDINGS VASCULAR Aorta: Normal caliber aorta without aneurysm, dissection, vasculitis or significant stenosis. Moderate atherosclerosis. Celiac: Patent with moderate stenosis of the origin. No aneurysm, dissection, or vasculitis. SMA: Patent without evidence of aneurysm, dissection, vasculitis or significant stenosis. Renals: The left renal artery is patent. High-grade stenosis of the proximal right renal artery. No aneurysm, dissection, vasculitis, or fibromuscular dysplasia. IMA: Patent with moderate stenosis of the origin. No aneurysm, dissection, or vasculitis. Inflow: Patent without evidence of aneurysm, dissection, vasculitis or significant stenosis. Veins: No obvious venous abnormality within the limitations of this arterial phase study. Review of the MIP images confirms the above findings. NON-VASCULAR Hepatobiliary: Unchanged simple cyst in the right hepatic lobe. No other focal liver abnormality. Mild gallbladder distention. No gallbladder wall  thickening or gallstones. Unchanged mild prominence of the central intrahepatic ducts. No common bile duct dilatation. Pancreas: Mild pancreatic enlargement with slight parenchymal heterogeneity. Prominent peripancreatic fluid in the lesser sac. No ductal dilatation. No necrosis or peripancreatic fluid collection. Spleen: Normal in size without focal abnormality. Unchanged small calcified granulomas. Adrenals/Urinary Tract: The adrenal glands are unremarkable. Unchanged left renal cysts. Unchanged moderate to severe right renal atrophy. No renal calculi or hydronephrosis. The bladder is unremarkable for the degree of distention. Stomach/Bowel: Small hiatal hernia. The stomach is otherwise within normal limits. No bowel wall thickening, distention, or surrounding inflammatory changes. Mild sigmoid diverticulosis. The appendix is not identified in this patient with a history of prior appendectomy. Lymphatic: No enlarged abdominal or pelvic lymph nodes. Reproductive: Uterus and bilateral adnexa are unremarkable for the patient's age. Other: No abdominal wall hernia or abnormality. No pneumoperitoneum. Musculoskeletal: No acute or significant osseous findings. Prior lumbar fusion and left hip total arthroplasty. Unchanged sacral Tarlov cysts. Review of the MIP images confirms the above findings. IMPRESSION: Abdomen and pelvis: 1. Acute pancreatitis. No necrosis or peripancreatic fluid collection. Vascular: 1. No evidence of thoracoabdominal aortic aneurysm or dissection. 2. High-grade stenosis and occlusion of the mid to distal left subclavian artery with distal reconstitution of the left axillary artery. 3. Moderate stenosis of the celiac and inferior mesenteric artery origins. 4. High-grade stenosis of the proximal right renal artery with resultant moderate to severe right renal atrophy. 5.  Aortic atherosclerosis (ICD10-I70.0). Chest: 1.  No acute intrathoracic process. 2.  Emphysema (ICD10-J43.9). Electronically  Signed   By: Titus Dubin M.D.   On: 03/24/2018 19:10    EKG: Independently reviewed. Sinus bradycardia  Assessment/Plan Principal Problem:   Acute pancreatitis Active Problems:   Essential hypertension   Hypothyroidism   ADD (attention deficit disorder)   # acute pancreatitis - etiology unclear. No hx gallstones and none seen on CT. Drinks 1-2 glasses of wine a day but swears no more and no hx heavier alcohol use. No new meds. CT showing mildly enlarged pancreas, nothing acute. No markers for severe disease present, and symptoms much improved now after fluids and Fentanyl. LA resolved to wnl  with fluid resuscitation. - npo overnight - LR - fentanyl for pain control (various opioid allergies but has tolerated fentanyl) - has outpt GI f/u scheduled for chronic diarrhea, colonoscopy is planned later this month. May require further GI w/u of possible etiology. - f/u lipids  # hypokalemia - mild, 3.4 - LR - f/u mg, and K  # Elevated AST - likely 2/2 above - am CMP, further w/u if persists or worsens  # HTN - here bp mildly elevated - hold home amlod, losart, and prn hctz given acute illness; resume as able  # ADD # MDD  - cont home strattera, wellbutrin, cymbalta  # Chronic urge incontinence  - cont home oxybutynin  # hypothyroid - cont home synthroid   DVT prophylaxis: lovenox Code Status: full  Family Communication: daughter Reesa Chew  Disposition Plan: tbd  Consults called: none  Admission status: med/surg    Desma Maxim MD [facility] Pager [phone]  If 7PM-7AM, please contact night-coverage [email or web address] Password [email or web address]  03/24/2018, 11:45 PM

## 2018-03-24 NOTE — ED Notes (Signed)
Pt to CT with RN and monitor. Pt awake, alert.

## 2018-03-24 NOTE — ED Notes (Signed)
Attempted to call report, but RN was unavailable at this time. Left call back information.

## 2018-03-24 NOTE — ED Notes (Signed)
ED Provider at bedside. 

## 2018-03-24 NOTE — ED Notes (Signed)
[facility] notified (Kim) - patient ready for transport 

## 2018-03-24 NOTE — ED Notes (Signed)
PT with large water emesis. Suction at bedside.

## 2018-03-24 NOTE — ED Notes (Signed)
Asked [facility] to repage hospitalist

## 2018-03-24 NOTE — ED Triage Notes (Signed)
abdominal pain x 2 hours. Nausea. Pale.

## 2018-03-25 DIAGNOSIS — E039 Hypothyroidism, unspecified: Secondary | ICD-10-CM

## 2018-03-25 LAB — CBC
HCT: 34.3 % — ABNORMAL LOW (ref 36.0–46.0)
Hemoglobin: 10.9 g/dL — ABNORMAL LOW (ref 12.0–15.0)
MCH: 32.8 pg (ref 26.0–34.0)
MCHC: 31.8 g/dL (ref 30.0–36.0)
MCV: 103.3 fL — AB (ref 80.0–100.0)
NRBC: 0 % (ref 0.0–0.2)
Platelets: 176 10*3/uL (ref 150–400)
RBC: 3.32 MIL/uL — ABNORMAL LOW (ref 3.87–5.11)
RDW: 13.3 % (ref 11.5–15.5)
WBC: 15.2 10*3/uL — ABNORMAL HIGH (ref 4.0–10.5)

## 2018-03-25 LAB — COMPREHENSIVE METABOLIC PANEL
ALT: 28 U/L (ref 0–44)
AST: 29 U/L (ref 15–41)
Albumin: 3.5 g/dL (ref 3.5–5.0)
Alkaline Phosphatase: 41 U/L (ref 38–126)
Anion gap: 9 (ref 5–15)
BUN: 15 mg/dL (ref 8–23)
CO2: 19 mmol/L — ABNORMAL LOW (ref 22–32)
Calcium: 8.5 mg/dL — ABNORMAL LOW (ref 8.9–10.3)
Chloride: 114 mmol/L — ABNORMAL HIGH (ref 98–111)
Creatinine, Ser: 0.77 mg/dL (ref 0.44–1.00)
GFR calc Af Amer: >60 mL/min (ref >60)
GFR calc non Af Amer: >60 mL/min (ref >60)
Glucose, Bld: 85 mg/dL (ref 70–99)
Potassium: 3.8 mmol/L (ref 3.5–5.1)
Sodium: 142 mmol/L (ref 135–145)
Total Bilirubin: 0.7 mg/dL (ref 0.3–1.2)
Total Protein: 5.7 g/dL — ABNORMAL LOW (ref 6.5–8.1)

## 2018-03-25 LAB — MAGNESIUM: Magnesium: 1.6 mg/dL — ABNORMAL LOW (ref 1.7–2.4)

## 2018-03-25 LAB — LIPASE, BLOOD: LIPASE: 1839 U/L — AB (ref 11–51)

## 2018-03-25 LAB — LIPID PANEL
CHOL/HDL RATIO: 2.6 ratio
Cholesterol: 136 mg/dL (ref 0–200)
HDL: 53 mg/dL (ref >40)
LDL Cholesterol: 75 mg/dL (ref 0–99)
Triglycerides: 42 mg/dL (ref <150)
VLDL: 8 mg/dL (ref 0–40)

## 2018-03-25 MED ORDER — FENTANYL CITRATE (PF) 100 MCG/2ML IJ SOLN
25.0000 ug | INTRAMUSCULAR | Status: DC | PRN
  Administered 2018-03-25 (×3): 25 ug via INTRAVENOUS
  Administered 2018-03-25: 100 ug via INTRAVENOUS
  Administered 2018-03-25 – 2018-03-26 (×3): 25 ug via INTRAVENOUS
  Administered 2018-03-26: 100 ug via INTRAVENOUS
  Administered 2018-03-27 (×3): 25 ug via INTRAVENOUS
  Filled 2018-03-25 (×11): qty 2

## 2018-03-25 MED ORDER — ENOXAPARIN SODIUM 40 MG/0.4ML ~~LOC~~ SOLN
40.0000 mg | SUBCUTANEOUS | Status: DC
  Administered 2018-03-25 – 2018-03-27 (×3): 40 mg via SUBCUTANEOUS
  Filled 2018-03-25 (×4): qty 0.4

## 2018-03-25 MED ORDER — ATOMOXETINE HCL 60 MG PO CAPS: 100.0000 mg | ORAL_CAPSULE | Freq: Every day | ORAL | Status: DC

## 2018-03-25 MED ORDER — LOSARTAN POTASSIUM 50 MG PO TABS
100.0000 mg | ORAL_TABLET | Freq: Every day | ORAL | Status: DC
  Administered 2018-03-25 – 2018-03-27 (×3): 100 mg via ORAL
  Filled 2018-03-25 (×3): qty 2

## 2018-03-25 MED ORDER — OXYBUTYNIN CHLORIDE ER 5 MG PO TB24
10.0000 mg | ORAL_TABLET | Freq: Every day | ORAL | Status: DC
  Administered 2018-03-25 – 2018-03-26 (×2): 10 mg via ORAL
  Filled 2018-03-25 (×2): qty 2

## 2018-03-25 MED ORDER — LACTATED RINGERS IV SOLN
INTRAVENOUS | Status: DC
  Administered 2018-03-25 – 2018-03-27 (×5): via INTRAVENOUS

## 2018-03-25 MED ORDER — BUPROPION HCL 75 MG PO TABS
75.0000 mg | ORAL_TABLET | Freq: Two times a day (BID) | ORAL | Status: DC
  Administered 2018-03-25 – 2018-03-27 (×5): 75 mg via ORAL
  Filled 2018-03-25 (×5): qty 1

## 2018-03-25 MED ORDER — LEVOTHYROXINE SODIUM 25 MCG PO TABS
125.0000 ug | ORAL_TABLET | Freq: Every day | ORAL | Status: DC
  Administered 2018-03-25 – 2018-03-27 (×3): 125 ug via ORAL
  Filled 2018-03-25 (×3): qty 1

## 2018-03-25 MED ORDER — CLONIDINE HCL 0.1 MG PO TABS
0.1000 mg | ORAL_TABLET | Freq: Three times a day (TID) | ORAL | Status: DC
  Administered 2018-03-25 – 2018-03-27 (×7): 0.1 mg via ORAL
  Filled 2018-03-25 (×7): qty 1

## 2018-03-25 MED ORDER — DULOXETINE HCL 60 MG PO CPEP
60.0000 mg | ORAL_CAPSULE | Freq: Every day | ORAL | Status: DC
  Administered 2018-03-25 – 2018-03-27 (×3): 60 mg via ORAL
  Filled 2018-03-25 (×3): qty 1

## 2018-03-25 MED ORDER — ATOMOXETINE HCL 60 MG PO CAPS
100.0000 mg | ORAL_CAPSULE | Freq: Every day | ORAL | Status: DC
  Administered 2018-03-25 – 2018-03-27 (×3): 100 mg via ORAL
  Filled 2018-03-25 (×3): qty 1

## 2018-03-25 MED ORDER — LEVOTHYROXINE SODIUM 125 MCG PO CAPS: 125.0000 ug | ORAL_CAPSULE | Freq: Every day | ORAL | Status: DC

## 2018-03-25 NOTE — Progress Notes (Signed)
PROGRESS NOTE  Rose [name] OHY:[id number] DOB: 1941-02-04 DOA: 03/24/2018 PCP: Mackie Pai, PA-C  HPI/Recap of past 24 hours: Rose [name] is a 78 y.o. female with medical history significant for hypothyroidism, htn, who presents with above.  Was in her usual state of health when this afternoon developed relatively acute onset periumbilical pain, very sharp in nature. Did not radiate. Associated with some nausea and one episode nbnb emesis. No chest pain or SOB. Has had about a month of chronic occasional diarrhea for which she has seen her pcp and GI, but does not note increased diarrhea or changes in bowel habits the last few days. Denies history gallstones or pancreatitis; this is the first time this has happened. Drinks 1-2 glasses of wine a day, rarely if ever more. No use of illicit drugs. Pain much improved now after fentanyl.  03/25/2018: Patient seen and examined at her bedside.  Reports pain in her abdomen 6-7 out of 10 prior to taking her pain medication.  Lipase levels are trending down.  Denies nausea or diarrhea.    Assessment/Plan: Principal Problem:   Acute pancreatitis Active Problems:   Essential hypertension   Hypothyroidism   ADD (attention deficit disorder)  Suspected acute alcoholic pancreatitis  -alcohol cessation counseling done at bedside.   Lipase levels are trending down from about 6000 to about 1000 N.p.o. Start clear liquid diet as tolerated Pain management IV fluid hydration LDL 75  Uncontrolled hypertension Resume home antihypertensive medications clonidine and losartan Continue to hold off HCTZ and amlodipine Continue to monitor vital signs  Vascular stenosis affecting mid to distal left subclavian artery, celiac and inferior mesenteric artery, proximal right renal artery which led to right renal atrophy  COPD/emphysema Nebs as needed  Hypothyroidism Continue levothyroxine  hypokalemia -resolved post repletion   # Elevated  AST - likely 2/2 above - am CMP, further w/u if persists or worsens  # HTN - here bp mildly elevated - hold home amlod, losart, and prn hctz given acute illness; resume as able  # ADD # MDD  - cont home strattera, wellbutrin, cymbalta  # Chronic urge incontinence  - cont home oxybutynin    DVT prophylaxis:  Subcu Lovenox daily Code Status: full  Family Communication:  None at bedside Disposition Plan:  Home when hemodynamically stable.  Consults called: none      Objective: Vitals:   03/24/18 2115 03/24/18 2130 03/24/18 2145 03/24/18 2253  BP: (!) 159/86 (!) 175/73 (!) 149/90 (!) 153/68  Pulse: 99 98 86 90  Resp: 16 (!) 21 19 20   Temp: 98 F (36.7 C)   97.9 F (36.6 C)  TempSrc: Oral   Oral  SpO2: 97% 99% 100% 99%  Weight:    54 kg  Height:    5\' 2"  (1.575 m)    Intake/Output Summary (Last 24 hours) at 03/25/2018 1237 Last data filed at 03/25/2018 0545 Gross per 24 hour  Intake 1792.72 ml  Output 600 ml  Net 1192.72 ml   Filed Weights   03/24/18 1741 03/24/18 2253  Weight: 52.6 kg 54 kg    Exam:  . General: 78 y.o. year-old female well developed well nourished in no acute distress.  Alert and oriented x3. . Cardiovascular: Regular rate and rhythm with no rubs or gallops.  No thyromegaly or JVD noted.   Marland Kitchen Respiratory: Clear to auscultation with no wheezes or rales. Good inspiratory effort. . Abdomen: Soft.   Periumbilical tenderness on palpation.  Nondistended with normal bowel  sounds x4 quadrants. . Musculoskeletal: Trace lower extremity edema. 2/4 pulses in all 4 extremities. Marland Kitchen Psychiatry: Mood is appropriate for condition and setting   Data Reviewed: CBC: Recent Labs  Lab 03/24/18 1757 03/25/18 0619  WBC 19.3* 15.2*  NEUTROABS 15.9*  --   HGB 12.5 10.9*  HCT 39.4 34.3*  MCV 101.0* 103.3*  PLT 241 536   Basic Metabolic Panel: Recent Labs  Lab 03/24/18 1757 03/25/18 0619  NA 139 142  K 3.4* 3.8  CL 109 114*  CO2 20* 19*  GLUCOSE  198* 85  BUN 19 15  CREATININE 0.97 0.77  CALCIUM 10.0 8.5*  MG  --  1.6*   GFR: Estimated Creatinine Clearance: 45.1 mL/min (by C-G formula based on SCr of 0.77 mg/dL). Liver Function Tests: Recent Labs  Lab 03/24/18 1757 03/25/18 0619  AST 48* 29  ALT 36 28  ALKPHOS 57 41  BILITOT 0.7 0.7  PROT 7.1 5.7*  ALBUMIN 4.4 3.5   Recent Labs  Lab 03/24/18 1757 03/25/18 0619  LIPASE 6,275* 1,839*   No results for input(s): AMMONIA in the last 168 hours. Coagulation Profile: Recent Labs  Lab 03/24/18 1852  INR 1.01   Cardiac Enzymes: Recent Labs  Lab 03/24/18 1757  TROPONINI <0.03   BNP (last 3 results) No results for input(s): PROBNP in the last 8760 hours. HbA1C: No results for input(s): HGBA1C in the last 72 hours. CBG: No results for input(s): GLUCAP in the last 168 hours. Lipid Profile: Recent Labs    03/25/18 0619  CHOL 136  HDL 53  LDLCALC 75  TRIG 42  CHOLHDL 2.6   Thyroid Function Tests: No results for input(s): TSH, T4TOTAL, FREET4, T3FREE, THYROIDAB in the last 72 hours. Anemia Panel: No results for input(s): VITAMINB12, FOLATE, FERRITIN, TIBC, IRON, RETICCTPCT in the last 72 hours. Urine analysis:    Component Value Date/Time   COLORURINE YELLOW 03/24/2018 1856   APPEARANCEUR CLEAR 03/24/2018 1856   LABSPEC <1.005 (L) 03/24/2018 1856   PHURINE 6.0 03/24/2018 1856   GLUCOSEU NEGATIVE 03/24/2018 1856   GLUCOSEU NEGATIVE 04/21/2015 1319   HGBUR NEGATIVE 03/24/2018 1856   BILIRUBINUR NEGATIVE 03/24/2018 1856   KETONESUR NEGATIVE 03/24/2018 1856   PROTEINUR NEGATIVE 03/24/2018 1856   UROBILINOGEN 0.2 04/21/2015 1319   NITRITE NEGATIVE 03/24/2018 1856   LEUKOCYTESUR NEGATIVE 03/24/2018 1856   Sepsis Labs: @LABRCNTIP (procalcitonin:4,lacticidven:4)  )No results found for this or any previous visit (from the past 240 hour(s)).    Studies: Ct Angio Chest/abd/pel For Dissection W And/or Wo Contrast  Result Date: 03/24/2018 CLINICAL DATA:   Acute abdominal pain. EXAM: CT ANGIOGRAPHY CHEST, ABDOMEN AND PELVIS TECHNIQUE: Multidetector CT imaging through the chest, abdomen and pelvis was performed using the standard protocol during bolus administration of intravenous contrast. Multiplanar reconstructed images and MIPs were obtained and reviewed to evaluate the vascular anatomy. CONTRAST:  13mL ISOVUE-370 IOPAMIDOL (ISOVUE-370) INJECTION 76% COMPARISON:  CT abdomen pelvis dated February 07, 2018. PET-CT dated August 14, 2013. FINDINGS: CTA CHEST FINDINGS Cardiovascular: Preferential opacification of the thoracic aorta. No evidence of thoracic aortic aneurysm or dissection. No intramural hematoma. Coronary, aortic arch, and branch vessel atherosclerotic vascular disease. High-grade stenosis and occlusion of the mid to distal left subclavian artery with distal reconstitution of the left axillary artery secondary to collaterals. Normal heart size. No pericardial effusion. No central pulmonary embolism. Mediastinum/Nodes: No enlarged mediastinal, hilar, or axillary lymph nodes. Thyroid gland and trachea demonstrate no significant findings. Mild esophageal distention. Lungs/Pleura: Mild centrilobular emphysema. No  focal consolidation, pleural effusion, or pneumothorax. Unchanged calcified granuloma in the left lower lobe. Musculoskeletal: No chest wall abnormality. No acute or significant osseous findings. Review of the MIP images confirms the above findings. CTA ABDOMEN AND PELVIS FINDINGS VASCULAR Aorta: Normal caliber aorta without aneurysm, dissection, vasculitis or significant stenosis. Moderate atherosclerosis. Celiac: Patent with moderate stenosis of the origin. No aneurysm, dissection, or vasculitis. SMA: Patent without evidence of aneurysm, dissection, vasculitis or significant stenosis. Renals: The left renal artery is patent. High-grade stenosis of the proximal right renal artery. No aneurysm, dissection, vasculitis, or fibromuscular dysplasia. IMA:  Patent with moderate stenosis of the origin. No aneurysm, dissection, or vasculitis. Inflow: Patent without evidence of aneurysm, dissection, vasculitis or significant stenosis. Veins: No obvious venous abnormality within the limitations of this arterial phase study. Review of the MIP images confirms the above findings. NON-VASCULAR Hepatobiliary: Unchanged simple cyst in the right hepatic lobe. No other focal liver abnormality. Mild gallbladder distention. No gallbladder wall thickening or gallstones. Unchanged mild prominence of the central intrahepatic ducts. No common bile duct dilatation. Pancreas: Mild pancreatic enlargement with slight parenchymal heterogeneity. Prominent peripancreatic fluid in the lesser sac. No ductal dilatation. No necrosis or peripancreatic fluid collection. Spleen: Normal in size without focal abnormality. Unchanged small calcified granulomas. Adrenals/Urinary Tract: The adrenal glands are unremarkable. Unchanged left renal cysts. Unchanged moderate to severe right renal atrophy. No renal calculi or hydronephrosis. The bladder is unremarkable for the degree of distention. Stomach/Bowel: Small hiatal hernia. The stomach is otherwise within normal limits. No bowel wall thickening, distention, or surrounding inflammatory changes. Mild sigmoid diverticulosis. The appendix is not identified in this patient with a history of prior appendectomy. Lymphatic: No enlarged abdominal or pelvic lymph nodes. Reproductive: Uterus and bilateral adnexa are unremarkable for the patient's age. Other: No abdominal wall hernia or abnormality. No pneumoperitoneum. Musculoskeletal: No acute or significant osseous findings. Prior lumbar fusion and left hip total arthroplasty. Unchanged sacral Tarlov cysts. Review of the MIP images confirms the above findings. IMPRESSION: Abdomen and pelvis: 1. Acute pancreatitis. No necrosis or peripancreatic fluid collection. Vascular: 1. No evidence of thoracoabdominal aortic  aneurysm or dissection. 2. High-grade stenosis and occlusion of the mid to distal left subclavian artery with distal reconstitution of the left axillary artery. 3. Moderate stenosis of the celiac and inferior mesenteric artery origins. 4. High-grade stenosis of the proximal right renal artery with resultant moderate to severe right renal atrophy. 5.  Aortic atherosclerosis (ICD10-I70.0). Chest: 1.  No acute intrathoracic process. 2.  Emphysema (ICD10-J43.9). Electronically Signed   By: Titus Dubin M.D.   On: 03/24/2018 19:10    Scheduled Meds: . atomoxetine  100 mg Oral Daily  . buPROPion  75 mg Oral BID  . cloNIDine  0.1 mg Oral TID  . DULoxetine  60 mg Oral Daily  . enoxaparin (LOVENOX) injection  40 mg Subcutaneous Q24H  . levothyroxine  125 mcg Oral Q0600  . losartan  100 mg Oral Daily  . oxybutynin  10 mg Oral QHS    Continuous Infusions: . lactated ringers 75 mL/hr at 03/25/18 1135     LOS: 1 day     Kayleen Memos, MD [facility] Pager [phone]  If 7PM-7AM, please contact night-coverage [email or web address] Password [facility] 03/25/2018, 12:37 PM

## 2018-03-26 DIAGNOSIS — I10 Essential (primary) hypertension: Secondary | ICD-10-CM

## 2018-03-26 LAB — LIPASE, BLOOD: Lipase: 150 U/L — ABNORMAL HIGH (ref 11–51)

## 2018-03-26 LAB — BASIC METABOLIC PANEL
Anion gap: 9 (ref 5–15)
BUN: 13 mg/dL (ref 8–23)
CHLORIDE: 109 mmol/L (ref 98–111)
CO2: 19 mmol/L — ABNORMAL LOW (ref 22–32)
Calcium: 8.5 mg/dL — ABNORMAL LOW (ref 8.9–10.3)
Creatinine, Ser: 0.88 mg/dL (ref 0.44–1.00)
GFR calc Af Amer: >60 mL/min (ref >60)
GFR calc non Af Amer: >60 mL/min (ref >60)
Glucose, Bld: 128 mg/dL — ABNORMAL HIGH (ref 70–99)
POTASSIUM: 3.8 mmol/L (ref 3.5–5.1)
Sodium: 137 mmol/L (ref 135–145)

## 2018-03-26 LAB — CBC
HEMATOCRIT: 32.5 % — AB (ref 36.0–46.0)
HEMOGLOBIN: 10.6 g/dL — AB (ref 12.0–15.0)
MCH: 32.8 pg (ref 26.0–34.0)
MCHC: 32.6 g/dL (ref 30.0–36.0)
MCV: 100.6 fL — ABNORMAL HIGH (ref 80.0–100.0)
Platelets: 134 10*3/uL — ABNORMAL LOW (ref 150–400)
RBC: 3.23 MIL/uL — AB (ref 3.87–5.11)
RDW: 13.2 % (ref 11.5–15.5)
WBC: 12.7 10*3/uL — ABNORMAL HIGH (ref 4.0–10.5)
nRBC: 0 % (ref 0.0–0.2)

## 2018-03-26 MED ORDER — AMLODIPINE BESYLATE 5 MG PO TABS
5.0000 mg | ORAL_TABLET | Freq: Two times a day (BID) | ORAL | Status: DC
  Administered 2018-03-26 – 2018-03-27 (×3): 5 mg via ORAL
  Filled 2018-03-26 (×3): qty 1

## 2018-03-26 MED ORDER — CHOLESTYRAMINE LIGHT 4 G PO PACK
4.0000 g | PACK | Freq: Two times a day (BID) | ORAL | Status: DC
  Administered 2018-03-26 – 2018-03-27 (×2): 4 g via ORAL
  Filled 2018-03-26 (×2): qty 1

## 2018-03-26 MED ORDER — CHOLESTYRAMINE 4 G PO PACK
4.0000 g | PACK | Freq: Two times a day (BID) | ORAL | Status: DC
  Filled 2018-03-26: qty 1

## 2018-03-26 NOTE — Progress Notes (Signed)
PROGRESS NOTE  Rose [name] ION:[id number] DOB: 1940-11-16 DOA: 03/24/2018 PCP: Mackie Pai, PA-C  HPI/Recap of past 24 hours: Rose [name] is a 78 y.o. female with medical history significant for hypothyroidism, htn, who presents with above.  Was in her usual state of health when this afternoon developed relatively acute onset periumbilical pain, very sharp in nature. Did not radiate. Associated with some nausea and one episode nbnb emesis. No chest pain or SOB. Has had about a month of chronic occasional diarrhea for which she has seen her pcp and GI, but does not note increased diarrhea or changes in bowel habits the last few days. Denies history gallstones or pancreatitis; this is the first time this has happened. Drinks 1-2 glasses of wine a day, rarely if ever more. No use of illicit drugs. Pain much improved now after fentanyl.  03/25/2018: Patient seen and examined at her bedside.  Reports pain in her abdomen 6-7 out of 10 prior to taking her pain medication.  Lipase levels are trending down.  Denies nausea or diarrhea.  03/26/2018: Patient seen and examined at bedside.  No acute events overnight.  She states her abdominal pain is improving.  She is tolerating a diet with advance as tolerated.    Assessment/Plan: Principal Problem:   Acute pancreatitis Active Problems:   Essential hypertension   Hypothyroidism   ADD (attention deficit disorder)  Acute alcoholic pancreatitis  Alcohol cessation counseling done at bedside.   Lipase levels are trending down from about 6000 to about 1000-1 50 Leukocytosis also resolving from 15,000-12,000 Advance diet as tolerated, tolerating a solid diet well Pain management in place Cutdown IV fluid hydration straight to 75 cc/h LDL 75  Uncontrolled hypertension Resume home antihypertensive medications clonidine, Norvasc, and losartan Continue to hold HCTZ Continue to monitor vital signs  Vascular stenosis affecting mid to  distal left subclavian artery, celiac and inferior mesenteric artery, proximal right renal artery which led to right renal atrophy  COPD/emphysema Nebs as needed  Hypothyroidism Continue levothyroxine  hypokalemia -resolved post repletion   # Elevated AST - likely 2/2 above - am CMP, further w/u if persists or worsens  # ADD # MDD  - cont home strattera, wellbutrin, cymbalta  # Chronic urge incontinence  - cont home oxybutynin    DVT prophylaxis:  Subcu Lovenox daily Code Status: full  Family Communication:  None at bedside Disposition Plan:  Home possibly tomorrow 03/27/2018 Consults called: none      Objective: Vitals:   03/25/18 1258 03/25/18 2116 03/26/18 0614 03/26/18 1248  BP: (!) 143/63 117/65 (!) 155/66 106/70  Pulse: 84 83 83 80  Resp: 18 16 14 16   Temp: 98.2 F (36.8 C) 97.8 F (36.6 C) 97.9 F (36.6 C) 98.2 F (36.8 C)  TempSrc: Oral Oral Oral Oral  SpO2: 95% 96% 95% 95%  Weight:      Height:        Intake/Output Summary (Last 24 hours) at 03/26/2018 1553 Last data filed at 03/26/2018 1259 Gross per 24 hour  Intake 3536.65 ml  Output 400 ml  Net 3136.65 ml   Filed Weights   03/24/18 1741 03/24/18 2253  Weight: 52.6 kg 54 kg    Exam:  . General: 78 y.o. year-old female well developed well-nourished in no acute distress.  Alert and oriented x3.   . Cardiovascular: Regular rate and rhythm with no rubs or gallops.  No JVD or thyromegaly noted.   Marland Kitchen Respiratory: Clear to auscultation with no  wheezes or rales.  Good inspiratory effort.. . Abdomen: Soft.   Mild and improved tenderness on palpation of periumbilical region.  Nondistended with normal bowel sounds x4 quadrants. . Musculoskeletal: Trace lower extremity edema. 2/4 pulses in all 4 extremities. Marland Kitchen Psychiatry: Mood is appropriate for condition and setting   Data Reviewed: CBC: Recent Labs  Lab 03/24/18 1757 03/25/18 0619 03/26/18 0837  WBC 19.3* 15.2* 12.7*  NEUTROABS 15.9*  --    --   HGB 12.5 10.9* 10.6*  HCT 39.4 34.3* 32.5*  MCV 101.0* 103.3* 100.6*  PLT 241 176 417*   Basic Metabolic Panel: Recent Labs  Lab 03/24/18 1757 03/25/18 0619 03/26/18 0837  NA 139 142 137  K 3.4* 3.8 3.8  CL 109 114* 109  CO2 20* 19* 19*  GLUCOSE 198* 85 128*  BUN 19 15 13   CREATININE 0.97 0.77 0.88  CALCIUM 10.0 8.5* 8.5*  MG  --  1.6*  --    GFR: Estimated Creatinine Clearance: 41 mL/min (by C-G formula based on SCr of 0.88 mg/dL). Liver Function Tests: Recent Labs  Lab 03/24/18 1757 03/25/18 0619  AST 48* 29  ALT 36 28  ALKPHOS 57 41  BILITOT 0.7 0.7  PROT 7.1 5.7*  ALBUMIN 4.4 3.5   Recent Labs  Lab 03/24/18 1757 03/25/18 0619 03/26/18 0837  LIPASE 6,275* 1,839* 150*   No results for input(s): AMMONIA in the last 168 hours. Coagulation Profile: Recent Labs  Lab 03/24/18 1852  INR 1.01   Cardiac Enzymes: Recent Labs  Lab 03/24/18 1757  TROPONINI <0.03   BNP (last 3 results) No results for input(s): PROBNP in the last 8760 hours. HbA1C: No results for input(s): HGBA1C in the last 72 hours. CBG: No results for input(s): GLUCAP in the last 168 hours. Lipid Profile: Recent Labs    03/25/18 0619  CHOL 136  HDL 53  LDLCALC 75  TRIG 42  CHOLHDL 2.6   Thyroid Function Tests: No results for input(s): TSH, T4TOTAL, FREET4, T3FREE, THYROIDAB in the last 72 hours. Anemia Panel: No results for input(s): VITAMINB12, FOLATE, FERRITIN, TIBC, IRON, RETICCTPCT in the last 72 hours. Urine analysis:    Component Value Date/Time   COLORURINE YELLOW 03/24/2018 1856   APPEARANCEUR CLEAR 03/24/2018 1856   LABSPEC <1.005 (L) 03/24/2018 1856   PHURINE 6.0 03/24/2018 1856   GLUCOSEU NEGATIVE 03/24/2018 1856   GLUCOSEU NEGATIVE 04/21/2015 1319   HGBUR NEGATIVE 03/24/2018 1856   BILIRUBINUR NEGATIVE 03/24/2018 1856   KETONESUR NEGATIVE 03/24/2018 1856   PROTEINUR NEGATIVE 03/24/2018 1856   UROBILINOGEN 0.2 04/21/2015 1319   NITRITE NEGATIVE  03/24/2018 1856   LEUKOCYTESUR NEGATIVE 03/24/2018 1856   Sepsis Labs: @LABRCNTIP (procalcitonin:4,lacticidven:4)  )No results found for this or any previous visit (from the past 240 hour(s)).    Studies: No results found.  Scheduled Meds: . amLODipine  5 mg Oral BID  . atomoxetine  100 mg Oral Daily  . buPROPion  75 mg Oral BID  . cholestyramine  4 g Oral BID  . cloNIDine  0.1 mg Oral TID  . DULoxetine  60 mg Oral Daily  . enoxaparin (LOVENOX) injection  40 mg Subcutaneous Q24H  . levothyroxine  125 mcg Oral Q0600  . losartan  100 mg Oral Daily  . oxybutynin  10 mg Oral QHS    Continuous Infusions: . lactated ringers 75 mL/hr at 03/26/18 1435     LOS: 2 days     Kayleen Memos, MD [facility] Pager [phone]  If  7PM-7AM, please contact night-coverage [email or web address] Password [facility] 03/26/2018, 3:53 PM

## 2018-03-27 LAB — BASIC METABOLIC PANEL
Anion gap: 7 (ref 5–15)
BUN: 9 mg/dL (ref 8–23)
CO2: 23 mmol/L (ref 22–32)
Calcium: 8.7 mg/dL — ABNORMAL LOW (ref 8.9–10.3)
Chloride: 108 mmol/L (ref 98–111)
Creatinine, Ser: 0.66 mg/dL (ref 0.44–1.00)
GFR calc Af Amer: >60 mL/min (ref >60)
GFR calc non Af Amer: >60 mL/min (ref >60)
Glucose, Bld: 91 mg/dL (ref 70–99)
Potassium: 3.5 mmol/L (ref 3.5–5.1)
Sodium: 138 mmol/L (ref 135–145)

## 2018-03-27 LAB — CBC
HCT: 28.8 % — ABNORMAL LOW (ref 36.0–46.0)
HEMOGLOBIN: 9.1 g/dL — AB (ref 12.0–15.0)
MCH: 32 pg (ref 26.0–34.0)
MCHC: 31.6 g/dL (ref 30.0–36.0)
MCV: 101.4 fL — ABNORMAL HIGH (ref 80.0–100.0)
Platelets: 151 10*3/uL (ref 150–400)
RBC: 2.84 MIL/uL — ABNORMAL LOW (ref 3.87–5.11)
RDW: 12.9 % (ref 11.5–15.5)
WBC: 8.8 10*3/uL (ref 4.0–10.5)
nRBC: 0 % (ref 0.0–0.2)

## 2018-03-27 LAB — LIPASE, BLOOD: Lipase: 42 U/L (ref 11–51)

## 2018-03-27 MED ORDER — BUPROPION HCL 75 MG PO TABS: 75.0000 mg | ORAL_TABLET | Freq: Two times a day (BID) | ORAL | 0 refills | Status: DC

## 2018-03-27 NOTE — Discharge Summary (Signed)
Discharge Summary  Rose [name] [id number]:[id number] DOB: Mar 23, 1940  PCP: Mackie Pai, PA-C  Admit date: 03/24/2018 Discharge date: 03/27/2018  Time spent: 35 minutes  Recommendations for Outpatient Follow-up:  1. Follow-up with your PCP 2. Take your medications as prescribed 3. Completely abstain from alcohol  Discharge Diagnoses:  Active Hospital Problems   Diagnosis Date Noted  . Acute pancreatitis 03/24/2018  . ADD (attention deficit disorder) 09/25/2013  . Hypothyroidism 07/19/2013  . Essential hypertension 08/09/2006    Resolved Hospital Problems  No resolved problems to display.    Discharge Condition: Stable  Diet recommendation: Low fat diet  Vitals:   03/27/18 0608 03/27/18 1239  BP: 112/77 (!) 137/49  Pulse: 88 81  Resp: 16 16  Temp: 98.3 F (36.8 C) 98.2 F (36.8 C)  SpO2: 97% 92%    History of present illness:  [name] Heathis a 78 y.o.femalewith medical history significant forhypothyroidism, htn, who presents with severe epigastric pain. CT showing mildly enlarged pancreas with lipase greater than 6000.  Symptoms improved with IV fluid and pain management.  Will slowly introduce to a diet which she tolerated well.  Lipase continue to trend down.  Patient has a history of alcohol use.  Was advised to completely abstain from alcohol.  Lipid panel was essentially normal.  03/27/2018: Patient seen and examined at her bedside.  No acute events overnight.  She is tolerating a solid diet with no exacerbation of her epigastric pain.  Denies nausea.  She has no new complaints.  States she feels much better.  On the day of discharge, the patient was hemodynamically stable.  She will need to follow-up with her primary care provider posthospitalization and take her medications as prescribed.  Patient was also advised to completely abstain from alcohol use.    Hospital Course:  Principal Problem:   Acute pancreatitis Active Problems:   Essential  hypertension   Hypothyroidism   ADD (attention deficit disorder)  Acute alcoholic pancreatitis  Alcohol cessation counseling done at bedside.   Lipase levels are trending down from 6275 to 1839 to 150 to 42 on 03/27/2018 Leukocytosis also resolving from 15,000 to 12,000 to 8000 Tolerated solid diet well Completely abstain from alcohol Follow-up with your PCP  Hypertension, controlled Continue home antihypertensive medications clonidine, Norvasc, and losartan Continue to hold HCTZ Follow-up with your PCP  Vascular stenosis affecting mid to distal left subclavian artery, celiac and inferior mesenteric artery, proximal right renal artery which led to right renal atrophy Follow-up with your PCP for surveillance  COPD/emphysema Nebs as needed  Hypothyroidism Continue levothyroxine  hypokalemia -resolved post repletion   Elevated AST - likely 2/2 acute pancreatitis Follow-up with PCP  ADD/MDD  Cont home strattera, wellbutrin, cymbalta  Chronic urge incontinence  - cont home oxybutynin     Code Status:full  Consults called:none   Discharge Exam: BP (!) 137/49 (BP Location: Right Arm)   Pulse 81   Temp 98.2 F (36.8 C) (Oral)   Resp 16   Ht 5\' 2"  (1.575 m)   Wt 54 kg   SpO2 92%   BMI 21.77 kg/m  . General: 78 y.o. year-old female well developed well nourished in no acute distress.  Alert and oriented x3. . Cardiovascular: Regular rate and rhythm with no rubs or gallops.  No thyromegaly or JVD noted.   Marland Kitchen Respiratory: Clear to auscultation with no wheezes or rales. Good inspiratory effort. . Abdomen: Soft minimally tender nondistended with normal bowel sounds x4 quadrants. . Musculoskeletal: No  lower extremity edema. 2/4 pulses in all 4 extremities. Marland Kitchen Psychiatry: Mood is appropriate for condition and setting  Discharge Instructions You were cared for by a hospitalist during your hospital stay. If you have any questions about your discharge  medications or the care you received while you were in the hospital after you are discharged, you can call the unit and asked to speak with the hospitalist on call if the hospitalist that took care of you is not available. Once you are discharged, your primary care physician will handle any further medical issues. Please note that NO REFILLS for any discharge medications will be authorized once you are discharged, as it is imperative that you return to your primary care physician (or establish a relationship with a primary care physician if you do not have one) for your aftercare needs so that they can reassess your need for medications and monitor your lab values.   Allergies as of 03/27/2018      Reactions   Amoxicillin    REACTION: Rash DID THE REACTION INVOLVE: Swelling of the face/tongue/throat, SOB, or low BP? No Sudden or severe rash/hives, skin peeling, or the inside of the mouth or nose? Yes Did it require medical treatment? No When did it last happen?1990s If all above answers are "NO", may proceed with cephalosporin use.   Codeine Hives   Hydrocodone-acetaminophen    REACTION: itching   Hydromorphone Hcl    Morphine And Related Hives, Itching   Morphine Sulfate    REACTION: unspecified   Nsaids    Ulcer      Medication List    STOP taking these medications   diphenoxylate-atropine 2.5-0.025 MG tablet Commonly known as:  LOMOTIL   hydrochlorothiazide 12.5 MG capsule Commonly known as:  MICROZIDE     TAKE these medications   alendronate 70 MG tablet Commonly known as:  FOSAMAX TAKE 1 TABLET BY MOUTH ONCE WEEKLY BEFORE BREAKFAST, ON AN EMPTY STOMACH: REMAIN UPRIGHT FOR 30 MINUTES:TAKE WITH 8 OUNCES OF WATER What changed:    how much to take  how to take this  when to take this  additional instructions   amLODipine 5 MG tablet Commonly known as:  NORVASC TAKE ONE TABLET BY MOUTH TWICE A DAY   aspirin 81 MG tablet Take 81 mg by mouth daily.     atomoxetine 100 MG capsule Commonly known as:  STRATTERA Take 100 mg by mouth daily.   buPROPion 75 MG tablet Commonly known as:  WELLBUTRIN Take 1 tablet (75 mg total) by mouth 2 (two) times daily. What changed:    medication strength  how much to take  when to take this   cholestyramine 4 g packet Commonly known as:  QUESTRAN Take 1 packet (4 g total) by mouth 2 (two) times daily. Must take medication 2 hours before or after other medications;   cloNIDine 0.1 MG tablet Commonly known as:  CATAPRES Take 0.1 mg by mouth 3 (three) times daily.   DULoxetine 60 MG capsule Commonly known as:  CYMBALTA TAKE ONE CAPSULE BY MOUTH DAILY   ferrous sulfate dried 160 (50 Fe) MG Tbcr SR tablet Commonly known as:  SLOW FE Take 160 mg by mouth 2 (two) times daily. Patient has been taking Once Daily   Glucosamine 500 MG Tabs Take 1 tablet by mouth daily. Glucosamine-Chondronton   levothyroxine 125 MCG tablet Commonly known as:  SYNTHROID, LEVOTHROID Take 125 mcg by mouth daily before breakfast. What changed:  Another medication with the same  name was removed. Continue taking this medication, and follow the directions you see here.   losartan 100 MG tablet Commonly known as:  COZAAR TAKE ONE TABLET BY MOUTH DAILY   oxybutynin 10 MG 24 hr tablet Commonly known as:  DITROPAN-XL Take 1 tablet (10 mg total) by mouth at bedtime. What changed:  when to take this      Allergies  Allergen Reactions  . Amoxicillin     REACTION: Rash  DID THE REACTION INVOLVE: Swelling of the face/tongue/throat, SOB, or low BP? No Sudden or severe rash/hives, skin peeling, or the inside of the mouth or nose? Yes Did it require medical treatment? No When did it last happen?1990s If all above answers are "NO", may proceed with cephalosporin use.   . Codeine Hives  . Hydrocodone-Acetaminophen     REACTION: itching  . Hydromorphone Hcl   . Morphine And Related Hives and Itching  . Morphine  Sulfate     REACTION: unspecified  . Nsaids     Ulcer   Follow-up Information    Saguier, Iris Pert. Call in 1 day(s).   Specialties:  Internal Medicine, Family Medicine Why:  Please call for post hospital follow-up appointment. Contact information: [place] [address] [phone]            The results of significant diagnostics from this hospitalization (including imaging, microbiology, ancillary and laboratory) are listed below for reference.    Significant Diagnostic Studies: Ct Angio Chest/abd/pel For Dissection W And/or Wo Contrast  Result Date: 03/24/2018 CLINICAL DATA:  Acute abdominal pain. EXAM: CT ANGIOGRAPHY CHEST, ABDOMEN AND PELVIS TECHNIQUE: Multidetector CT imaging through the chest, abdomen and pelvis was performed using the standard protocol during bolus administration of intravenous contrast. Multiplanar reconstructed images and MIPs were obtained and reviewed to evaluate the vascular anatomy. CONTRAST:  165mL ISOVUE-370 IOPAMIDOL (ISOVUE-370) INJECTION 76% COMPARISON:  CT abdomen pelvis dated February 07, 2018. PET-CT dated August 14, 2013. FINDINGS: CTA CHEST FINDINGS Cardiovascular: Preferential opacification of the thoracic aorta. No evidence of thoracic aortic aneurysm or dissection. No intramural hematoma. Coronary, aortic arch, and branch vessel atherosclerotic vascular disease. High-grade stenosis and occlusion of the mid to distal left subclavian artery with distal reconstitution of the left axillary artery secondary to collaterals. Normal heart size. No pericardial effusion. No central pulmonary embolism. Mediastinum/Nodes: No enlarged mediastinal, hilar, or axillary lymph nodes. Thyroid gland and trachea demonstrate no significant findings. Mild esophageal distention. Lungs/Pleura: Mild centrilobular emphysema. No focal consolidation, pleural effusion, or pneumothorax. Unchanged calcified granuloma in the left lower lobe.  Musculoskeletal: No chest wall abnormality. No acute or significant osseous findings. Review of the MIP images confirms the above findings. CTA ABDOMEN AND PELVIS FINDINGS VASCULAR Aorta: Normal caliber aorta without aneurysm, dissection, vasculitis or significant stenosis. Moderate atherosclerosis. Celiac: Patent with moderate stenosis of the origin. No aneurysm, dissection, or vasculitis. SMA: Patent without evidence of aneurysm, dissection, vasculitis or significant stenosis. Renals: The left renal artery is patent. High-grade stenosis of the proximal right renal artery. No aneurysm, dissection, vasculitis, or fibromuscular dysplasia. IMA: Patent with moderate stenosis of the origin. No aneurysm, dissection, or vasculitis. Inflow: Patent without evidence of aneurysm, dissection, vasculitis or significant stenosis. Veins: No obvious venous abnormality within the limitations of this arterial phase study. Review of the MIP images confirms the above findings. NON-VASCULAR Hepatobiliary: Unchanged simple cyst in the right hepatic lobe. No other focal liver abnormality. Mild gallbladder distention. No gallbladder wall thickening or gallstones. Unchanged  mild prominence of the central intrahepatic ducts. No common bile duct dilatation. Pancreas: Mild pancreatic enlargement with slight parenchymal heterogeneity. Prominent peripancreatic fluid in the lesser sac. No ductal dilatation. No necrosis or peripancreatic fluid collection. Spleen: Normal in size without focal abnormality. Unchanged small calcified granulomas. Adrenals/Urinary Tract: The adrenal glands are unremarkable. Unchanged left renal cysts. Unchanged moderate to severe right renal atrophy. No renal calculi or hydronephrosis. The bladder is unremarkable for the degree of distention. Stomach/Bowel: Small hiatal hernia. The stomach is otherwise within normal limits. No bowel wall thickening, distention, or surrounding inflammatory changes. Mild sigmoid  diverticulosis. The appendix is not identified in this patient with a history of prior appendectomy. Lymphatic: No enlarged abdominal or pelvic lymph nodes. Reproductive: Uterus and bilateral adnexa are unremarkable for the patient's age. Other: No abdominal wall hernia or abnormality. No pneumoperitoneum. Musculoskeletal: No acute or significant osseous findings. Prior lumbar fusion and left hip total arthroplasty. Unchanged sacral Tarlov cysts. Review of the MIP images confirms the above findings. IMPRESSION: Abdomen and pelvis: 1. Acute pancreatitis. No necrosis or peripancreatic fluid collection. Vascular: 1. No evidence of thoracoabdominal aortic aneurysm or dissection. 2. High-grade stenosis and occlusion of the mid to distal left subclavian artery with distal reconstitution of the left axillary artery. 3. Moderate stenosis of the celiac and inferior mesenteric artery origins. 4. High-grade stenosis of the proximal right renal artery with resultant moderate to severe right renal atrophy. 5.  Aortic atherosclerosis (ICD10-I70.0). Chest: 1.  No acute intrathoracic process. 2.  Emphysema (ICD10-J43.9). Electronically Signed   By: Titus Dubin M.D.   On: 03/24/2018 19:10    Microbiology: No results found for this or any previous visit (from the past 240 hour(s)).   Labs: Basic Metabolic Panel: Recent Labs  Lab 03/24/18 1757 03/25/18 0619 03/26/18 0837 03/27/18 0539  NA 139 142 137 138  K 3.4* 3.8 3.8 3.5  CL 109 114* 109 108  CO2 20* 19* 19* 23  GLUCOSE 198* 85 128* 91  BUN 19 15 13 9   CREATININE 0.97 0.77 0.88 0.66  CALCIUM 10.0 8.5* 8.5* 8.7*  MG  --  1.6*  --   --    Liver Function Tests: Recent Labs  Lab 03/24/18 1757 03/25/18 0619  AST 48* 29  ALT 36 28  ALKPHOS 57 41  BILITOT 0.7 0.7  PROT 7.1 5.7*  ALBUMIN 4.4 3.5   Recent Labs  Lab 03/24/18 1757 03/25/18 0619 03/26/18 0837 03/27/18 0539  LIPASE 6,275* 1,839* 150* 42   No results for input(s): AMMONIA in the  last 168 hours. CBC: Recent Labs  Lab 03/24/18 1757 03/25/18 0619 03/26/18 0837 03/27/18 0539  WBC 19.3* 15.2* 12.7* 8.8  NEUTROABS 15.9*  --   --   --   HGB 12.5 10.9* 10.6* 9.1*  HCT 39.4 34.3* 32.5* 28.8*  MCV 101.0* 103.3* 100.6* 101.4*  PLT 241 176 134* 151   Cardiac Enzymes: Recent Labs  Lab 03/24/18 1757  TROPONINI <0.03   BNP: BNP (last 3 results) No results for input(s): BNP in the last 8760 hours.  ProBNP (last 3 results) No results for input(s): PROBNP in the last 8760 hours.  CBG: No results for input(s): GLUCAP in the last 168 hours.     Signed:  Kayleen Memos, MD [facility] 03/27/2018, 12:46 PM

## 2018-03-27 NOTE — Progress Notes (Signed)
DC instructions with appts and scripts to pick up   were given to PT and her daughter. Both verbalized understanding. Pt was taken to her daughters car via a wheelchair, She will be going home with her daughter

## 2018-03-27 NOTE — Care Management Important Message (Signed)
Important Message  Patient Details  Name: Rose [name] MRN: [id number] Date of Birth: 01-08-1941   Medicare Important Message Given:  Yes    Kerin Salen 03/27/2018, 10:46 AMImportant Message  Patient Details  Name: Rose [name] MRN: [id number] Date of Birth: [birth year]   Medicare Important Message Given:  Yes    Kerin Salen 03/27/2018, 10:46 AM

## 2018-03-27 NOTE — Discharge Instructions (Signed)
Pancreatitis Eating Plan  Pancreatitis is when your pancreas becomes irritated and swollen (inflamed). The pancreas is a small organ located behind your stomach. It helps your body digest food and regulate your blood sugar. Pancreatitis can affect how your body digests food, especially foods with fat. You may also have other symptoms such as abdominal pain or nausea.  When you have pancreatitis, following a low-fat eating plan may help you manage symptoms and recover more quickly. Work with your health care provider or a diet and nutrition specialist (dietitian) to create an eating plan that is right for you.  What are tips for following this plan?  Reading food labels  Use the information on food labels to help keep track of how much fat you eat:   Check the serving size.   Look for the amount of total fat in grams (g) in one serving.  ? Low-fat foods have 3 g of fat or less per serving.  ? Fat-free foods have 0.5 g of fat or less per serving.   Keep track of how much fat you eat based on how many servings you eat.  ? For example, if you eat two servings, the amount of fat you eat will be two times what is listed on the label.  Shopping     Buy low-fat or nonfat foods, such as:  ? Fresh, frozen, or canned fruits and vegetables.  ? Grains, including pasta, bread, and rice.  ? Lean meat, poultry, fish, and other protein foods.  ? Low-fat or nonfat dairy.   Avoid buying bakery products and other sweets made with whole milk, butter, and eggs.   Avoid buying snack foods with added fat, such as anything with butter or cheese flavoring.  Cooking   Remove skin from poultry, and remove extra fat from meat.   Limit the amount of fat and oil you use to 6 teaspoons or less per day.   Cook using low-fat methods, such as boiling, broiling, grilling, steaming, or baking.   Use spray oil to cook. Add fat-free chicken broth to add flavor and moisture.   Avoid adding cream to thicken soups or sauces. Use other thickeners  such as corn starch or tomato paste.  Meal planning     Eat a low-fat diet as told by your dietitian. For most people, this means having no more than 55-65 grams of fat each day.   Eat small, frequent meals throughout the day. For example, you may have 5-6 small meals instead of 3 large meals.   Drink enough fluid to keep your urine pale yellow.   Do not drink alcohol. Talk to your health care provider if you need help stopping.   Limit how much caffeine you have, including black coffee, black and green tea, caffeinated soft drinks, and energy drinks.  General information   Let your health care provider or dietitian know if you have unplanned weight loss on this eating plan.   You may be instructed to follow a clear liquid diet during a flare of symptoms. Talk with your health care provider about how to manage your diet during symptoms of a flare.   Take any vitamins or supplements as told by your health care provider.   Work with a dietitian, especially if you have other conditions such as obesity or diabetes mellitus.  What foods should I avoid?  Fruits  Fried fruits. Fruits served with butter or cream.  Vegetables  Fried vegetables. Vegetables cooked with butter, cheese, or   cream.  Grains  Biscuits, waffles, donuts, pastries, and croissants. Pies and cookies. Butter-flavored popcorn. Regular crackers.  Meats and other protein foods  Fatty cuts of meat. Poultry with skin. Organ meats. Bacon, sausage, and cold cuts. Whole eggs. Nuts and nut butters.  Dairy  Whole and 2% milk. Whole milk yogurt. Whole milk ice cream. Cream and half-and-half. Cream cheese. Sour cream. Cheese.  Beverages  Wine, beer, and liquor.  The items listed above may not be a complete list of foods and beverages to avoid. Contact a dietitian for more information.  Summary   Pancreatitis can affect how your body digests food, especially foods with fat.   When you have pancreatitis, it is recommended that you follow a low-fat eating  plan to help you recover more quickly and manage symptoms. For most people, this means limiting fat to no more than 55-65 grams per day.   Do not drink alcohol. Limit the amount of caffeine you have, and drink enough fluid to keep your urine pale yellow.  This information is not intended to replace advice given to you by your health care provider. Make sure you discuss any questions you have with your health care provider.  Document Released: 06/07/2017 Document Revised: 06/07/2017 Document Reviewed: 06/07/2017  Elsevier Interactive Patient Education  2019 [employer]

## 2018-03-28 ENCOUNTER — Telehealth: Payer: Self-pay | Admitting: *Deleted

## 2018-03-28 ENCOUNTER — Ambulatory Visit (AMBULATORY_SURGERY_CENTER): Payer: Self-pay | Admitting: *Deleted

## 2018-03-28 ENCOUNTER — Telehealth: Payer: Self-pay | Admitting: Gastroenterology

## 2018-03-28 VITALS — Ht 62.0 in | Wt 122.6 lb

## 2018-03-28 DIAGNOSIS — D508 Other iron deficiency anemias: Secondary | ICD-10-CM

## 2018-03-28 DIAGNOSIS — R197 Diarrhea, unspecified: Secondary | ICD-10-CM

## 2018-03-28 MED ORDER — NA SULFATE-K SULFATE-MG SULF 17.5-3.13-1.6 GM/177ML PO SOLN: 1.0000 | Freq: Once | ORAL | 0 refills | Status: AC

## 2018-03-28 NOTE — Telephone Encounter (Signed)
I have reviewed the discharge summary Had alcohol induced pancreatitis Plan: -Hold off on colonoscopy until March 2020 -Let her recover completely first.

## 2018-03-28 NOTE — Telephone Encounter (Signed)
Dr Lyndel Safe,  I just saw Rose [name] in [facility]. She was admitted -03-24-2018 for alcohol induced acute pancreatitis- she was discharged yesterday. She has a colon scheduled with you on 1-23 Thursday for diarrhea and IDA.   Is it okay to proceed with her colon as scheduled?  Please advise and thank you for your time, Marijean Niemann

## 2018-03-28 NOTE — Telephone Encounter (Signed)
Pt called wanting to speak with the nurse. I ask the nature of call she only stated it was about her Pancreatitis

## 2018-03-28 NOTE — Telephone Encounter (Signed)
I spoke with the patient. See previous TE.

## 2018-03-28 NOTE — Telephone Encounter (Signed)
Patient notified of Dr.Gupta's recommendations. She was instructed to call us back in February to make the appointment for the colonoscopy in March, IF she has completely healed. I told patient if she has not recovered from the pancreatitis she should wait to have the colonoscopy until then. Also notified her that she can always make an office visit with Dr.Gupta if needed. Pt's colonoscopy cancelled. Pt verbalizes understanding.   Recall colon in Epic for March 2020/RM

## 2018-03-28 NOTE — Progress Notes (Signed)
No egg or soy allergy known to patient  No issues with past sedation with any surgeries  or procedures, no intubation problems  No diet pills per patient No home 02 use per patient  No blood thinners per patient  Pt denies issues with constipation  No A fib or A flutter  EMMI video sent to pt's e mail - pt declined  Pt was admitted 1-10 for alcohol induced acute pancreatitis- she was discharged yesterday TE to Lyndel Safe to make sure ok to proceed with colon 1-23 Pt states she cannot drink the prep and the 2 waters behind each dose- she said she cannot drink that much- encouraged pt to drink the waters slow and to do the best she could- explained to her she has to drink the 2- 16 oz waters for the prep to work correctly- she verbalized understanding but said she not a big person . Encouraged to follow instructions in pV today

## 2018-03-29 ENCOUNTER — Telehealth: Payer: Self-pay

## 2018-03-29 NOTE — Telephone Encounter (Signed)
03/29/18  Transition Care Management Follow-up Telephone Call  ADMISSION DATE: 03/24/18 DISCHARGE DATE: 03/27/18  How have you been since you were released from the hospital? Feeling better per patient.   Do you understand why you were in the hospital? Yes   Do you understand the discharge instrcutions? Yes    Items Reviewed: Medications reviewed: Yes  Allergies reviewed: Yes   Dietary changes reviewed: Low fat diet.   Referrals reviewed: Appointment scheduled.   Functional Questionnaire:   Activities of Daily Living (ADLs): Patietn states she can perform all independently.  Any patient concerns?Patient would like to know when she  will she start feeling less pain at her pancreas area?   Confirmed importance and date/time of follow-up visits scheduled: Yes   Confirmed with patient if condition begins to worsen call PCP or go to the ER. Yes    Patient was given the office number and encouragred to call back with questions or concerns. Yes

## 2018-04-06 ENCOUNTER — Encounter: Payer: Self-pay | Admitting: Gastroenterology

## 2018-04-07 ENCOUNTER — Ambulatory Visit (INDEPENDENT_AMBULATORY_CARE_PROVIDER_SITE_OTHER): Payer: Medicare Other | Admitting: Medical

## 2018-04-07 ENCOUNTER — Encounter: Payer: Self-pay | Admitting: Medical

## 2018-04-07 VITALS — BP 132/60 | HR 75 | Temp 97.6°F | Resp 16 | Ht 62.0 in | Wt 113.0 lb

## 2018-04-07 DIAGNOSIS — D649 Anemia, unspecified: Secondary | ICD-10-CM | POA: Diagnosis not present

## 2018-04-07 DIAGNOSIS — R1011 Right upper quadrant pain: Secondary | ICD-10-CM

## 2018-04-07 DIAGNOSIS — Z8719 Personal history of other diseases of the digestive system: Secondary | ICD-10-CM

## 2018-04-07 NOTE — Progress Notes (Signed)
Subjective:    Patient ID: Rose [name], female    DOB: 1940/05/29, 78 y.o.   MRN: [id number]  HPI  Pt in for follow up from hospital.  Pt states her stomach feels fine. No abdomen pain, no nausea, no vomiting or fever.  Pt states she never heard of pancreatitis before. She states never had. She states drinks just  one glass of wine at night. If she was out with friends would have 2 glasses of wine.  No pain or symptoms since DC from ED.  She was admitted on 03/24/2017 and discharged on 03/27/2017.  Pt dc summary states.   Acute alcoholic pancreatitis Alcohol cessation counseling done at bedside.  Lipase levels are trending down from 6275 to 1839 to 150 to 42 on 03/27/2018 Leukocytosis also resolving from 15,000 to 12,000 to 8000 Tolerated solid diet well Completely abstain from alcohol Follow-up with your PCP  Pt was discharge normal bmp and amylase. She does have history of anemia.  On iron in the past but she is still anemic. No ifob in chart that I can see though I ordered.Dr. Lyndel Safe  GI MD note states.with negative Hemoccult stool several times.  CT showed.  FINDINGS: Lower chest: The lung bases are clear. The heart is mildly enlarged. Some contrast in the distal esophagus may indicate small hiatal hernia with gastroesophageal reflux. Correlate clinically.  Hepatobiliary: The liver enhances with no focal abnormality. Minimal prominence of central intrahepatic ducts is present. No calcified gallstones are seen. A large do descending duodenal diverticulum appears to be immediately adjacent to the ampulla. No common bile duct dilatation is evident.  Pancreas: The pancreas is normal in size and the pancreatic duct is not dilated.  Spleen: The spleen is unremarkable although there are several scattered splenic calcified granulomas present consistent with prior granulomatous disease.  Adrenals/Urinary Tract: The adrenal glands appear normal. A large cyst  emanates from the upper pole of the left kidney measuring 3.1 cm. A cyst emanates from the lower pole of the left kidney posteriorly measuring 2.6 cm. The left kidney appears compensatory hypertrophied relative to the more diminutive right kidney. No hydronephrosis is seen. The ureters appear to be normal in caliber. The urinary bladder is not well distended but no significant abnormality is noted.  Stomach/Bowel: The stomach is distended with oral contrast. Again there does appear to be a small hiatal hernia with probable reflux of contrast into the distal esophagus. No small bowel dilatation is currently seen and no edema is evident. The rectosigmoid colon is somewhat tortuous and there is a moderate amount of feces throughout the entire colon. No edema of the colonic mucosa is seen. The terminal ileum is opacified and no dilatation is seen. The appendix has by history been resected.  Vascular/Lymphatic: The abdominal aorta is normal in caliber with moderate abdominal aortic atherosclerosis noted. No adenopathy is seen.  Reproductive: The uterus is somewhat atrophic. No adnexal lesion is seen in no fluid is noted within the pelvis.  Other: No abdominal wall hernia is seen.  Musculoskeletal: The lumbar vertebrae are in normal alignment. There is posterior hardware for fusion from L1-L5. However, there is abnormality of the sacrum in the region of the spinal canal. Rounded lesions are present possibly representing Tarlov cysts, but clinical correlation is recommended.  IMPRESSION: No definite explanation for the patient's pain is seen. No current small-bowel obstruction is evident.  1. Large duodenal diverticulum appears to be very near the ampulla but no current ductal dilatation is  evident. 2. Small hiatal hernia with probable gastroesophageal reflux. 3. Left renal cysts are noted with hypertrophy of the left kidney and atrophy of the right kidney. No  hydronephrosis. 4. Suspect Tarlov cysts in the sacrococcygeal region. Correlate clinically.    Review of Systems  Constitutional: Negative for chills, fatigue and fever.  Respiratory: Negative for cough, chest tightness, shortness of breath and wheezing.   Cardiovascular: Negative for chest pain and palpitations.  Gastrointestinal: Negative for abdominal pain, constipation, diarrhea, nausea and vomiting.  Skin: Negative for rash.  Neurological: Negative for dizziness and headaches.  Hematological: Negative for adenopathy. Does not bruise/bleed easily.  Psychiatric/Behavioral: Negative for behavioral problems and confusion.    Past Medical History:  Diagnosis Date  . Abnormal blood pressure    left arm from old brachial artery repair  . Allergy   . Anemia   . Anxiety   . Atherosclerosis of renal artery ([place])   . Attention deficit disorder without mention of hyperactivity   . Basal cell carcinoma of face   . Benign heart murmur   . Blood transfusion without reported diagnosis   . Carotid artery disease ([place])   . Carotid bruit    Left  . Chronic bronchitis ([place])   . Depression   . DJD (degenerative joint disease) of hip   . Esophageal reflux   . History of depression   . History of spinal fusion   . Hypertension   . Morton's neuroma    Hx of, Left, 2 on right   . Myalgia and myositis, unspecified   . Other tenosynovitis of hand and wrist   . Pancreatitis    03-2018  . Peripheral neuropathy   . Personal history of alcoholism ([place])   . Personal history of peptic ulcer disease   . Pulmonary nodule   . Rotator cuff disorder    pain  . Solitary cyst of breast   . Subclavian steal syndrome   . Substance abuse ([place])   . Unspecified hypothyroidism   . Urinary incontinence   . Wears glasses      Social History   Socioeconomic History  . Marital status: Single    Spouse name: Not on file  . Number of children: Y  . Years of education: Not on file  . Highest  education level: Not on file  Occupational History  . Occupation: retired    Fish farm manager: RETIRED    CommentClinical cytogeneticist, had own firm  Social Needs  . Financial resource strain: Not on file  . Food insecurity:    Worry: Not on file    Inability: Not on file  . Transportation needs:    Medical: Not on file    Non-medical: Not on file  Tobacco Use  . Smoking status: Former Smoker    Packs/day: 1.00    Years: 25.00    Pack years: 25.00    Types: Cigarettes    Last attempt to quit: 03/16/1983    Years since quitting: 35.0  . Smokeless tobacco: Never Used  Substance and Sexual Activity  . Alcohol use: Yes    Comment: 1-2 glasses wine daily  . Drug use: No  . Sexual activity: Never    Comment: 1st intercourse 78 yo-Fewer than 5 partners  Lifestyle  . Physical activity:    Days per week: Not on file    Minutes per session: Not on file  . Stress: Not on file  Relationships  . Social connections:    Talks on phone: Not  on file    Gets together: Not on file    Attends religious service: Not on file    Active member of club or organization: Not on file    Attends meetings of clubs or organizations: Not on file    Relationship status: Not on file  . Intimate partner violence:    Fear of current or ex partner: Not on file    Emotionally abused: Not on file    Physically abused: Not on file    Forced sexual activity: Not on file  Other Topics Concern  . Not on file  Social History Narrative   [employer]-accounting      Married '60-47yr divorced; '84-2 years, divorced      3 daughters- '61, '70, '71; 2 sons-'53,'55 (schizophrenic-died); 10 grandchildren      Lives alone with 1 dog and 3 cats      Pt unsure of family history- was adopted.                   Past Surgical History:  Procedure Laterality Date  . APPENDECTOMY    . CARPOMETACARPEL SUSPENSION PLASTY Right 08/07/2013   Procedure: CARPOMETACARPEL [facility]) SUSPENSION PLASTY RIGHT THUMB;  Surgeon: Wynonia Sours,  MD;  Location: [facility];  Service: Orthopedics;  Laterality: Right;  . CARPOMETACARPEL SUSPENSION PLASTY Left 10/23/2015   Procedure: SUSPENSION PLASTY LEFT THUMB TRAPEZIUM EXCISION ABDUCTOR POLLICIS LONGUS TRANSFER;  Surgeon: Daryll Brod, MD;  Location: [facility];  Service: Orthopedics;  Laterality: Left;  . CERVICAL FUSION  2007   Dr. Valli Glance approach  . COLONOSCOPY    . JOINT REPLACEMENT Left   . Left brachial artery repair of pseudoaneurysm  2001   post a cath  . LUMBAR FUSION  09/2004   T12-L5 (Dr. Patrice Paradise)  . MINOR IRRIGATION AND DEBRIDEMENT OF WOUND Right 08/27/2014   Procedure: MINOR IRRIGATION AND DEBRIDEMENT OF WOUND;  Surgeon: Daryll Brod, MD;  Location: [facility];  Service: Orthopedics;  Laterality: Right;  . ORIF TIBIA FRACTURE  2010   Left distal  . Percutanous Transluminal Angioplasty of Renal Arteris    . REPAIR EXTENSOR TENDON Right 09/26/2014   Procedure: REPAIR EXTENSOR TENDON RIGHT RING FINGER ;  Surgeon: Daryll Brod, MD;  Location: [facility];  Service: Orthopedics;  Laterality: Right;  . SHOULDER ARTHROSCOPY  09/2008   Left  . TONSILLECTOMY AND ADENOIDECTOMY    . TOTAL HIP ARTHROPLASTY  2008   left  . WISDOM TOOTH EXTRACTION      Family History  Adopted: Yes  Problem Relation Age of Onset  . Schizophrenia Son        Deceased 33  . Hypertension Daughter        Renal  . Healthy Son        x1  . Healthy Daughter        x2    Allergies  Allergen Reactions  . Amoxicillin     REACTION: Rash  DID THE REACTION INVOLVE: Swelling of the face/tongue/throat, SOB, or low BP? No Sudden or severe rash/hives, skin peeling, or the inside of the mouth or nose? Yes Did it require medical treatment? No When did it last happen?1990s If all above answers are "NO", may proceed with cephalosporin use.   . Codeine Hives  . Hydrocodone-Acetaminophen     REACTION: itching  . Hydromorphone Hcl   .  Morphine And Related Hives and Itching  . Morphine Sulfate  REACTION: unspecified  . Nsaids     Ulcer    Current Outpatient Medications on File Prior to Visit  Medication Sig Dispense Refill  . alendronate (FOSAMAX) 70 MG tablet TAKE 1 TABLET BY MOUTH ONCE WEEKLY BEFORE BREAKFAST, ON AN EMPTY STOMACH: REMAIN UPRIGHT FOR 30 MINUTES:TAKE WITH 8 OUNCES OF WATER (Patient taking differently: Take 70 mg by mouth once a week. TAKE 1 TABLET BY MOUTH ONCE WEEKLY BEFORE BREAKFAST, ON AN EMPTY STOMACH: REMAIN UPRIGHT FOR 30 MINUTES:TAKE WITH 8 OUNCES OF WATER  Each Sunday) 4 tablet 12  . amLODipine (NORVASC) 5 MG tablet TAKE ONE TABLET BY MOUTH TWICE A DAY (Patient taking differently: Take 5 mg by mouth 2 (two) times daily. ) 180 tablet 0  . aspirin 81 MG tablet Take 81 mg by mouth daily.     Marland Kitchen atomoxetine (STRATTERA) 100 MG capsule Take 100 mg by mouth daily.    Marland Kitchen buPROPion (WELLBUTRIN) 75 MG tablet Take 1 tablet (75 mg total) by mouth 2 (two) times daily. 20 tablet 0  . cholestyramine (QUESTRAN) 4 g packet Take 1 packet (4 g total) by mouth 2 (two) times daily. Must take medication 2 hours before or after other medications; 60 each 12  . cloNIDine (CATAPRES) 0.1 MG tablet Take 0.1 mg by mouth 3 (three) times daily.    . DULoxetine (CYMBALTA) 60 MG capsule TAKE ONE CAPSULE BY MOUTH DAILY 30 capsule 5  . ferrous sulfate dried (SLOW FE) 160 (50 FE) MG TBCR Take 160 mg by mouth 2 (two) times daily. Patient has been taking Once Daily    . Glucosamine 500 MG TABS Take 1 tablet by mouth daily. Glucosamine-Chondronton    . levothyroxine (SYNTHROID, LEVOTHROID) 125 MCG tablet Take 125 mcg by mouth daily before breakfast.    . losartan (COZAAR) 100 MG tablet TAKE ONE TABLET BY MOUTH DAILY (Patient taking differently: Take 100 mg by mouth daily. ) 30 tablet 0  . oxybutynin (DITROPAN-XL) 10 MG 24 hr tablet Take 1 tablet (10 mg total) by mouth at bedtime. (Patient taking differently: Take 10 mg by mouth every  morning. ) 30 tablet 11   No current facility-administered medications on file prior to visit.     BP 132/60   Pulse 75   Temp 97.6 F (36.4 C) (Oral)   Resp 16   Ht 5\' 2"  (1.575 m)   Wt 113 lb (51.3 kg)   BMI 20.67 kg/m       Objective:   Physical Exam  General- No acute distress. Pleasant patient. Neck- Full range of motion, no jvd Lungs- Clear, even and unlabored. Heart- regular rate and rhythm. Neurologic- CNII- XII grossly intact.  Abdomen- soft, nd, +bs, no rebound or guarding. Faint rt upper quadrant.      Assessment & Plan:  Your pancreatitis seems to have resolved completely.  Enzyme levels were down back to normal level at the time of discharge.  No recent signs or symptoms.  However you did have a little faint right upper quadrant region tenderness on palpation.  So decided to repeat pancreas enzymes studies today.  Also went ahead and got CBC and metabolic panel today.  For history of anemia, will follow the CBC and decide to go ahead and get iron studies.  After review these will likely refer you to hematologist.  Blood pressure looks good today.  Continue current BP medications.  Follow-up date to be determined after lab review.  Mackie Pai, PA-C

## 2018-04-07 NOTE — Patient Instructions (Addendum)
Your pancreatitis seems to have resolved completely.  Enzyme levels were down back to normal level at the time of discharge.  No recent signs or symptoms.  However you did have a little faint right upper quadrant region tenderness on palpation.  So decided to repeat pancreas enzymes studies today.  Also went ahead and got CBC and metabolic panel today.  For history of anemia, will follow the CBC and decide to go ahead and get iron studies.  After review these will likely refer you to hematologist.  Blood pressure looks good today.  Continue current BP medications.  Follow-up date to be determined after lab review.

## 2018-04-08 LAB — COMPREHENSIVE METABOLIC PANEL
AG Ratio: 1.5 (calc) (ref 1.0–2.5)
ALT: 39 U/L — ABNORMAL HIGH (ref 6–29)
AST: 22 U/L (ref 10–35)
Albumin: 4.4 g/dL (ref 3.6–5.1)
Alkaline phosphatase (APISO): 107 U/L (ref 33–130)
BUN/Creatinine Ratio: 18 (calc) (ref 6–22)
BUN: 19 mg/dL (ref 7–25)
CO2: 24 mmol/L (ref 20–32)
Calcium: 10.4 mg/dL (ref 8.6–10.4)
Chloride: 108 mmol/L (ref 98–110)
Creat: 1.05 mg/dL — ABNORMAL HIGH (ref 0.60–0.93)
GLUCOSE: 89 mg/dL (ref 65–99)
Globulin: 3 g/dL (calc) (ref 1.9–3.7)
Potassium: 4.4 mmol/L (ref 3.5–5.3)
Sodium: 141 mmol/L (ref 135–146)
Total Bilirubin: 0.3 mg/dL (ref 0.2–1.2)
Total Protein: 7.4 g/dL (ref 6.1–8.1)

## 2018-04-08 LAB — CBC WITH DIFFERENTIAL/PLATELET
Absolute Monocytes: 464 cells/uL (ref 200–950)
BASOS PCT: 0.2 %
Basophils Absolute: 17 cells/uL (ref 0–200)
Eosinophils Absolute: 17 cells/uL (ref 15–500)
Eosinophils Relative: 0.2 %
HCT: 33 % — ABNORMAL LOW (ref 35.0–45.0)
Hemoglobin: 11.2 g/dL — ABNORMAL LOW (ref 11.7–15.5)
Lymphs Abs: 2064 cells/uL (ref 850–3900)
MCH: 32.6 pg (ref 27.0–33.0)
MCHC: 33.9 g/dL (ref 32.0–36.0)
MCV: 95.9 fL (ref 80.0–100.0)
MPV: 8.9 fL (ref 7.5–12.5)
Monocytes Relative: 5.4 %
Neutro Abs: 6037 cells/uL (ref 1500–7800)
Neutrophils Relative %: 70.2 %
Platelets: 450 10*3/uL — ABNORMAL HIGH (ref 140–400)
RBC: 3.44 10*6/uL — ABNORMAL LOW (ref 3.80–5.10)
RDW: 12.4 % (ref 11.0–15.0)
Total Lymphocyte: 24 %
WBC: 8.6 10*3/uL (ref 3.8–10.8)

## 2018-04-08 LAB — LIPASE: Lipase: 76 U/L — ABNORMAL HIGH (ref 7–60)

## 2018-04-08 LAB — IRON, TOTAL/TOTAL IRON BINDING CAP
%SAT: 24 % (ref 16–45)
Iron: 72 ug/dL (ref 45–160)
TIBC: 302 mcg/dL (calc) (ref 250–450)

## 2018-04-08 LAB — AMYLASE: Amylase: 45 U/L (ref 21–101)

## 2018-04-13 ENCOUNTER — Telehealth: Payer: Self-pay | Admitting: Gastroenterology

## 2018-04-13 ENCOUNTER — Encounter: Payer: Self-pay | Admitting: Gastroenterology

## 2018-04-13 NOTE — Telephone Encounter (Signed)
If she is feeling better and not having any diarrhea, she can stop taking cholestyramine

## 2018-04-13 NOTE — Telephone Encounter (Signed)
Please advise on medication regimen

## 2018-04-13 NOTE — Telephone Encounter (Signed)
Pt would like to know when she can stop taking cholestyramine.

## 2018-04-14 ENCOUNTER — Other Ambulatory Visit: Payer: Self-pay | Admitting: Gynecology

## 2018-04-14 ENCOUNTER — Other Ambulatory Visit: Payer: Self-pay | Admitting: Medical

## 2018-04-14 NOTE — Telephone Encounter (Signed)
Called and spoke with patient-Patient reports she is NOT having diarrhea at this time; Patient informed of MD recommendations; Patient is agreeable with plan of care; Patient verbalized understanding of information/instructions;  Patient was advised to call the office at [phone] if questions/concerns arise;

## 2018-04-19 ENCOUNTER — Telehealth: Payer: Self-pay | Admitting: Gastroenterology

## 2018-04-19 NOTE — Telephone Encounter (Signed)
Pt would like to speak with you, she states that she is experiencing diarrhea again.

## 2018-04-20 NOTE — Telephone Encounter (Signed)
Called and spoke with patient- patient reports she started having diarrhea and then proceeded to start on Cholestyramine for symptom relief; patient also reports she has seen an improvement of symptoms with taking the medication; patient requested to reschedule her colonoscopy with Dr. Nyra Market has been scheduled for her pre visit on 04/25/2018 at 1:00pm and her colonoscopy appt is scheduled for 05/03/2018 at 8:00am;  Patient verbalized understanding of information/instructions; Patient was advised to call back if questions/concerns arise;  Please advise if there are further instructions the patient needs to be made aware of

## 2018-04-20 NOTE — Telephone Encounter (Signed)
Ok

## 2018-04-25 ENCOUNTER — Ambulatory Visit (AMBULATORY_SURGERY_CENTER): Payer: Self-pay | Admitting: *Deleted

## 2018-04-25 VITALS — Ht 63.0 in | Wt 113.0 lb

## 2018-04-25 DIAGNOSIS — R197 Diarrhea, unspecified: Secondary | ICD-10-CM

## 2018-04-25 NOTE — Progress Notes (Addendum)
No egg or soy allergy known to patient  No issues with past sedation with any surgeries  or procedures, no intubation problems  No diet pills per patient No home 02 use per patient  No blood thinners per patient  Pt denies issues with constipation  No A fib or A flutter  EMMI video sent to pt's e mail - pt declined  Pt has a Suprep at home from last PV  Pt asked if she can get up at 5 am and drink bottle 2 of Suprep- Explained to her she has to get up  at 3 am and STOP ALL LIQUIDS by 5 am WED 2-19- We discussed these times several times in PV today - NOTHING after 5 am - she verbalized understanding but not sure if she will do this

## 2018-05-01 ENCOUNTER — Telehealth: Payer: Self-pay | Admitting: Gastroenterology

## 2018-05-01 NOTE — Telephone Encounter (Signed)
Pt explained she has to Iron as it can darken the stool and the colon and cause constipation - she verbalized understanding

## 2018-05-01 NOTE — Telephone Encounter (Signed)
Pt has upcoming colon 2.19.  Pt would like to know why she has to stop taking iron.

## 2018-05-02 NOTE — Telephone Encounter (Signed)
Spoke with patient and explained that she should be fine to have procedure.  Advised her to not eat any more red jello.

## 2018-05-03 ENCOUNTER — Ambulatory Visit (AMBULATORY_SURGERY_CENTER): Payer: Medicare Other | Admitting: Gastroenterology

## 2018-05-03 ENCOUNTER — Encounter: Payer: Self-pay | Admitting: Gastroenterology

## 2018-05-03 VITALS — BP 160/67 | HR 72 | Temp 99.3°F | Resp 18 | Ht 63.0 in | Wt 113.0 lb

## 2018-05-03 DIAGNOSIS — K52832 Lymphocytic colitis: Secondary | ICD-10-CM | POA: Diagnosis not present

## 2018-05-03 DIAGNOSIS — K5730 Diverticulosis of large intestine without perforation or abscess without bleeding: Secondary | ICD-10-CM | POA: Diagnosis not present

## 2018-05-03 DIAGNOSIS — J449 Chronic obstructive pulmonary disease, unspecified: Secondary | ICD-10-CM | POA: Diagnosis not present

## 2018-05-03 DIAGNOSIS — I10 Essential (primary) hypertension: Secondary | ICD-10-CM | POA: Diagnosis not present

## 2018-05-03 DIAGNOSIS — K648 Other hemorrhoids: Secondary | ICD-10-CM | POA: Diagnosis not present

## 2018-05-03 DIAGNOSIS — K5520 Angiodysplasia of colon without hemorrhage: Secondary | ICD-10-CM

## 2018-05-03 DIAGNOSIS — R197 Diarrhea, unspecified: Secondary | ICD-10-CM | POA: Diagnosis not present

## 2018-05-03 MED ORDER — SODIUM CHLORIDE 0.9 % IV SOLN: 500.0000 mL | Freq: Once | INTRAVENOUS | Status: DC

## 2018-05-03 NOTE — Progress Notes (Signed)
Report given to PACU, vss 

## 2018-05-03 NOTE — Progress Notes (Signed)
Called to room to assist during endoscopic procedure.  Patient ID and intended procedure confirmed with present staff. Received instructions for my participation in the procedure from the performing physician.  

## 2018-05-03 NOTE — Progress Notes (Signed)
Pt's states no medical or surgical changes since previsit or office visit. 

## 2018-05-03 NOTE — Op Note (Signed)
[name] Procedure Date: 05/03/2018 7:58 AM MRN: [id number] Endoscopist: Jackquline Denmark , MD Age: 78 Referring MD:  Date of Birth: 1940-06-24 Gender: Female Account #: [id number] Procedure:                Colonoscopy Indications:              Chronic diarrhea Medicines:                Monitored Anesthesia Care Procedure:                Pre-Anesthesia Assessment:                           - Prior to the procedure, a History and Physical                            was performed, and patient medications and                            allergies were reviewed. The patient's tolerance of                            previous anesthesia was also reviewed. The risks                            and benefits of the procedure and the sedation                            options and risks were discussed with the patient.                            All questions were answered, and informed consent                            was obtained. Prior Anticoagulants: The patient has                            taken aspirin, last dose was day of procedure. ASA                            Grade Assessment: II - A patient with mild systemic                            disease. After reviewing the risks and benefits,                            the patient was deemed in satisfactory condition to                            undergo the procedure.                           After obtaining informed consent, the colonoscope  was passed under direct vision. Throughout the                            procedure, the patient's blood pressure, pulse, and                            oxygen saturations were monitored continuously. The                            Colonoscope was introduced through the anus and                            advanced to the 2 cm into the ileum. The                            colonoscopy was performed without difficulty. The                patient tolerated the procedure well. The quality                            of the bowel preparation was good except in the                            right colon where there was retained solid                            vegetable material. Aggressive suctioning and                            aspiration was performed. Overall the examination                            was adequate. Over 95% of the colonic mucosa was                            visualized satisfactorily. Scope In: 8:05:38 AM Scope Out: 8:24:32 AM Scope Withdrawal Time: 0 hours 12 minutes 27 seconds  Total Procedure Duration: 0 hours 18 minutes 54 seconds  Findings:                 Multiple small-mouthed diverticula were found in                            the sigmoid colon and few in descending colon.                            Biopsies for histology were taken with a cold                            forceps for evaluation of microscopic colitis.                            Estimated blood loss: none.  Two (one small 6 mm, other medium measuring 1 cm)                            angiodysplastic lesions without bleeding were found                            in the cecum.                           Non-bleeding internal hemorrhoids were found during                            retroflexion. The hemorrhoids were small.                           The terminal ileum appeared normal. Biopsies were                            taken with a cold forceps for histology. Estimated                            blood loss: none.                           The exam was otherwise without abnormality on                            direct and retroflexion views. Complications:            No immediate complications. Estimated Blood Loss:     Estimated blood loss: none. Impression:               -Left colonic diverticulosis predominantly in the                            sigmoid colon.                            -Incidental Two non-bleeding colonic                            angiodysplastic lesions.                           -Small internal hemorrhoids.                           -Otherwise normal colonoscopy to TI. Recommendation:           - Patient has a contact number available for                            emergencies. The signs and symptoms of potential                            delayed complications were discussed with the  patient. Return to normal activities tomorrow.                            Written discharge instructions were provided to the                            patient.                           - Resume previous diet.                           - Continue present medications.                           - Await pathology results.                           - Return to GI office in 12 weeks. Jackquline Denmark, MD 05/03/2018 8:31:04 AM This report has been signed electronically.

## 2018-05-03 NOTE — Patient Instructions (Signed)
   Await biopsy results   Information on diverticulosis & hemorrhoids given to you today   YOU HAD AN ENDOSCOPIC PROCEDURE TODAY AT [facility]:   Refer to the procedure report that was given to you for any specific questions about what was found during the examination.  If the procedure report does not answer your questions, please call your gastroenterologist to clarify.  If you requested that your care partner not be given the details of your procedure findings, then the procedure report has been included in a sealed envelope for you to review at your convenience later.  YOU SHOULD EXPECT: Some feelings of bloating in the abdomen. Passage of more gas than usual.  Walking can help get rid of the air that was put into your GI tract during the procedure and reduce the bloating. If you had a lower endoscopy (such as a colonoscopy or flexible sigmoidoscopy) you may notice spotting of blood in your stool or on the toilet paper. If you underwent a bowel prep for your procedure, you may not have a normal bowel movement for a few days.  Please Note:  You might notice some irritation and congestion in your nose or some drainage.  This is from the oxygen used during your procedure.  There is no need for concern and it should clear up in a day or so.  SYMPTOMS TO REPORT IMMEDIATELY:   Following lower endoscopy (colonoscopy or flexible sigmoidoscopy):  Excessive amounts of blood in the stool  Significant tenderness or worsening of abdominal pains  Swelling of the abdomen that is new, acute  Fever of 100F or higher    For urgent or emergent issues, a gastroenterologist can be reached at any hour by calling [phone].   DIET:  We do recommend a small meal at first, but then you may proceed to your regular diet.  Drink plenty of fluids but you should avoid alcoholic beverages for 24 hours.  ACTIVITY:  You should plan to take it easy for the rest of today and you should NOT DRIVE  or use heavy machinery until tomorrow (because of the sedation medicines used during the test).    FOLLOW UP: Our staff will call the number listed on your records the next business day following your procedure to check on you and address any questions or concerns that you may have regarding the information given to you following your procedure. If we do not reach you, we will leave a message.  However, if you are feeling well and you are not experiencing any problems, there is no need to return our call.  We will assume that you have returned to your regular daily activities without incident.  If any biopsies were taken you will be contacted by phone or by letter within the next 1-3 weeks.  Please call us at [phone] if you have not heard about the biopsies in 3 weeks.    SIGNATURES/CONFIDENTIALITY: You and/or your care partner have signed paperwork which will be entered into your electronic medical record.  These signatures attest to the fact that that the information above on your After Visit Summary has been reviewed and is understood.  Full responsibility of the confidentiality of this discharge information lies with you and/or your care-partner.

## 2018-05-04 ENCOUNTER — Telehealth: Payer: Self-pay

## 2018-05-04 NOTE — Telephone Encounter (Signed)
Pt return call everything is fine no problems

## 2018-05-04 NOTE — Telephone Encounter (Signed)
Called [phone] and left a messaged we tried to reach pt for a follow up call. maw

## 2018-05-04 NOTE — Telephone Encounter (Signed)
Left message on f/u call 

## 2018-05-05 ENCOUNTER — Encounter: Payer: Self-pay | Admitting: Gastroenterology

## 2018-05-08 ENCOUNTER — Telehealth: Payer: Self-pay | Admitting: Gastroenterology

## 2018-05-08 ENCOUNTER — Other Ambulatory Visit: Payer: Self-pay

## 2018-05-08 DIAGNOSIS — R197 Diarrhea, unspecified: Secondary | ICD-10-CM

## 2018-05-08 MED ORDER — BUDESONIDE 3 MG PO CPEP: ORAL_CAPSULE | ORAL | 0 refills | Status: DC

## 2018-05-08 NOTE — Telephone Encounter (Signed)
Don from [facility] called needing a call back to get clarification on the instructions with the prescription that was called in today of budesonide (ENTOCORT EC) 3 MG 24 hr capsule [[id number]]

## 2018-05-09 NOTE — Telephone Encounter (Signed)
I called the pharmacy and spoke with the pharmacist who said prescription does not need clarification.

## 2018-05-14 ENCOUNTER — Other Ambulatory Visit: Payer: Self-pay | Admitting: Medical

## 2018-05-30 ENCOUNTER — Ambulatory Visit (INDEPENDENT_AMBULATORY_CARE_PROVIDER_SITE_OTHER): Payer: Medicare Other | Admitting: Gastroenterology

## 2018-05-30 ENCOUNTER — Other Ambulatory Visit: Payer: Self-pay

## 2018-05-30 ENCOUNTER — Other Ambulatory Visit (INDEPENDENT_AMBULATORY_CARE_PROVIDER_SITE_OTHER): Payer: Medicare Other

## 2018-05-30 ENCOUNTER — Encounter: Payer: Self-pay | Admitting: Gastroenterology

## 2018-05-30 VITALS — BP 124/70 | HR 67 | Temp 97.8°F | Ht 63.0 in | Wt 117.4 lb

## 2018-05-30 DIAGNOSIS — D508 Other iron deficiency anemias: Secondary | ICD-10-CM

## 2018-05-30 DIAGNOSIS — R197 Diarrhea, unspecified: Secondary | ICD-10-CM

## 2018-05-30 NOTE — Patient Instructions (Signed)
If you are age 80 or older, your body mass index should be between 23-30. Your Body mass index is 20.79 kg/m. If this is out of the aforementioned range listed, please consider follow up with your Primary Care Provider.  If you are age 75 or younger, your body mass index should be between 19-25. Your Body mass index is 20.79 kg/m. If this is out of the aformentioned range listed, please consider follow up with your Primary Care Provider.   Please go to the lab on the 2nd floor [address] before you leave the office today.   Thank you,  Dr. Jackquline Denmark

## 2018-05-30 NOTE — Progress Notes (Signed)
Chief Complaint:   Referring Provider:  Mackie Pai, PA-C      ASSESSMENT AND PLAN;   #1. Diarrhea d/t lymphocytic colitis (resolved)- H/O constipation in the past.  Negative stool studies for GI pathogens, positive for WBCs.  Negative CT scan abdo/pelvis 01/2018, neg colon 04/2018 except for nonbleeding AVMs, colonic diverticulosis. Random Bx- positive for lymphocytic colitis.  #2. H/O chronic IDA, on iron supplementation. Baseline Hb 10.5-11.5).  #3 H/O acute pancreatitis Jan 10,2020 (on CT) d/t alcohol.  Stopped ever since.  #4.  Extensive mesenteric atherosclerosis on CT. No history S/O mesenteric ischemia  Plan: -Continue budesonide 9mg  po qd x 6 more weeks, then if better, 6mg  for 2 weeks followed by 3 mg for another 2 weeks. I -Can use imodium AD on as needed basis. -CBC, CMP, lipase and celiac screen. -Continue baby aspirin. -FU in 6 months. If continues to be anemic, recommend EGD.  She likes to wait until "coronavirus scare" is over. -Copy of the CT scan was given to the patient.   HPI:    Rose [name] is a 78 y.o. female  Doing very well. Diarrhea has completely resolved. No abdominal pain Is pleased with the progress. Just came for follow-up visit. S/p colonoscopy as above     CT 03/24/2018 1. Acute pancreatitis. No necrosis or peripancreatic fluid collection.  Vascular:  1. No evidence of thoracoabdominal aortic aneurysm or dissection. 2. High-grade stenosis and occlusion of the mid to distal left subclavian artery with distal reconstitution of the left axillary artery. 3. Moderate stenosis of the celiac and inferior mesenteric artery origins. 4. High-grade stenosis of the proximal right renal artery with resultant moderate to severe right renal atrophy. 5. Aortic atherosclerosis (ICD10-I70.0).  Chest:  1. No acute intrathoracic process. 2. Emphysema (ICD10-J43.9). Additional social history:  [employer]ing       Married '60-52yr divorced; '84-2 years, divorced      3 daughters- '61, '70, '71; 2 sons-'53,'55 (schizophrenic-died); 10 grandchildren      Lives alone with 1 dog and 3 cats          Past Medical History:  Diagnosis Date  . Abnormal blood pressure    left arm from old brachial artery repair  . Allergy   . Anemia   . Anxiety   . Atherosclerosis of renal artery ([place])   . Attention deficit disorder without mention of hyperactivity   . Basal cell carcinoma of face   . Benign heart murmur   . Blood transfusion without reported diagnosis   . Carotid artery disease ([place])   . Carotid bruit    Left  . Chronic bronchitis ([place])   . Depression   . DJD (degenerative joint disease) of hip   . Esophageal reflux   . History of depression   . History of spinal fusion   . Hypertension   . Morton's neuroma    Hx of, Left, 2 on right   . Myalgia and myositis, unspecified   . Other tenosynovitis of hand and wrist   . Pancreatitis    03-2018  . Peripheral neuropathy   . Personal history of alcoholism ([place])   . Personal history of peptic ulcer disease   . Pulmonary nodule   . Rotator cuff disorder    pain  . Solitary cyst of breast   . Subclavian steal syndrome   . Substance abuse ([place])   . Unspecified hypothyroidism   . Urinary incontinence   . Wears glasses  Past Surgical History:  Procedure Laterality Date  . APPENDECTOMY    . CARPOMETACARPEL SUSPENSION PLASTY Right 08/07/2013   Procedure: CARPOMETACARPEL [facility]) SUSPENSION PLASTY RIGHT THUMB;  Surgeon: Wynonia Sours, MD;  Location: [facility];  Service: Orthopedics;  Laterality: Right;  . CARPOMETACARPEL SUSPENSION PLASTY Left 10/23/2015   Procedure: SUSPENSION PLASTY LEFT THUMB TRAPEZIUM EXCISION ABDUCTOR POLLICIS LONGUS TRANSFER;  Surgeon: Daryll Brod, MD;  Location: [facility];  Service: Orthopedics;  Laterality: Left;  . CERVICAL FUSION  2007   Dr. Valli Glance approach  . COLONOSCOPY     . JOINT REPLACEMENT Left   . Left brachial artery repair of pseudoaneurysm  2001   post a cath  . LUMBAR FUSION  09/2004   T12-L5 (Dr. Patrice Paradise)  . MINOR IRRIGATION AND DEBRIDEMENT OF WOUND Right 08/27/2014   Procedure: MINOR IRRIGATION AND DEBRIDEMENT OF WOUND;  Surgeon: Daryll Brod, MD;  Location: [facility];  Service: Orthopedics;  Laterality: Right;  . ORIF TIBIA FRACTURE  2010   Left distal  . Percutanous Transluminal Angioplasty of Renal Arteris    . REPAIR EXTENSOR TENDON Right 09/26/2014   Procedure: REPAIR EXTENSOR TENDON RIGHT RING FINGER ;  Surgeon: Daryll Brod, MD;  Location: [facility];  Service: Orthopedics;  Laterality: Right;  . SHOULDER ARTHROSCOPY  09/2008   Left  . TONSILLECTOMY AND ADENOIDECTOMY    . TOTAL HIP ARTHROPLASTY  2008   left  . WISDOM TOOTH EXTRACTION      Family History  Adopted: Yes  Problem Relation Age of Onset  . Schizophrenia Son        Deceased 105  . Hypertension Daughter        Renal  . Healthy Son        x1  . Healthy Daughter        x2    Social History   Tobacco Use  . Smoking status: Former Smoker    Packs/day: 1.00    Years: 25.00    Pack years: 25.00    Types: Cigarettes    Last attempt to quit: 03/16/1983    Years since quitting: 35.2  . Smokeless tobacco: Never Used  Substance Use Topics  . Alcohol use: Not Currently    Comment: 1-2 glasses wine daily  . Drug use: No    Current Outpatient Medications  Medication Sig Dispense Refill  . alendronate (FOSAMAX) 70 MG tablet TAKE 1 TABLET BY MOUTH ONCE WEEKLY BEFORE BREAKFAST, ON AN EMPTY STOMACH: REMAIN UPRIGHT FOR 30 MINUTES:TAKE WITH 8 OUNCES OF WATER (Patient taking differently: Take 70 mg by mouth once a week. TAKE 1 TABLET BY MOUTH ONCE WEEKLY BEFORE BREAKFAST, ON AN EMPTY STOMACH: REMAIN UPRIGHT FOR 30 MINUTES:TAKE WITH 8 OUNCES OF WATER  Each Sunday) 4 tablet 12  . amLODipine (NORVASC) 5 MG tablet TAKE ONE TABLET BY MOUTH TWICE A DAY  (Patient taking differently: Take 5 mg by mouth 2 (two) times daily. ) 180 tablet 0  . aspirin 81 MG tablet Take 81 mg by mouth daily.     Marland Kitchen atomoxetine (STRATTERA) 100 MG capsule Take 100 mg by mouth daily.    . budesonide (ENTOCORT EC) 3 MG 24 hr capsule Take 3 tabs daily x 8 weeks, if not better-then begin taking 2 tabs daily x 6 weeks, then 1 tab daily x 2 weeks; 266 capsule 0  . buPROPion (WELLBUTRIN) 75 MG tablet Take 1 tablet (75 mg total) by mouth 2 (two) times daily. [place]  tablet 0  . cholestyramine (QUESTRAN) 4 g packet Take 1 packet (4 g total) by mouth 2 (two) times daily. Must take medication 2 hours before or after other medications; 60 each 12  . cloNIDine (CATAPRES) 0.1 MG tablet Take 0.1 mg by mouth 3 (three) times daily.    . DULoxetine (CYMBALTA) 60 MG capsule TAKE ONE CAPSULE BY MOUTH DAILY 30 capsule 5  . ferrous sulfate dried (SLOW FE) 160 (50 FE) MG TBCR Take 160 mg by mouth 2 (two) times daily. Patient has been taking Once Daily    . Glucosamine 500 MG TABS Take 1 tablet by mouth daily. Glucosamine-Chondronton    . levothyroxine (SYNTHROID, LEVOTHROID) 125 MCG tablet Take 125 mcg by mouth daily before breakfast.    . losartan (COZAAR) 100 MG tablet TAKE ONE TABLET BY MOUTH DAILY 30 tablet 0  . oxybutynin (DITROPAN-XL) 10 MG 24 hr tablet TAKE ONE TABLET BY MOUTH EVERY NIGHT AT BEDTIME 30 tablet 11   No current facility-administered medications for this visit.     Allergies  Allergen Reactions  . Amoxicillin     REACTION: Rash  DID THE REACTION INVOLVE: Swelling of the face/tongue/throat, SOB, or low BP? No Sudden or severe rash/hives, skin peeling, or the inside of the mouth or nose? Yes Did it require medical treatment? No When did it last happen?1990s If all above answers are "NO", may proceed with cephalosporin use.   . Codeine Hives  . Hydrocodone-Acetaminophen     REACTION: itching  . Hydromorphone Hcl   . Morphine And Related Hives and Itching  .  Morphine Sulfate     REACTION: unspecified  . Nsaids     Ulcer    Review of Systems:  neg     Physical Exam:    BP 124/70   Pulse 67   Temp 97.8 F (36.6 C)   Ht 5\' 3"  (1.6 m)   Wt 117 lb 6 oz (53.2 kg)   BMI 20.79 kg/m  Filed Weights   05/30/18 1436  Weight: 117 lb 6 oz (53.2 kg)   Constitutional:  Well-developed, in no acute distress. Psychiatric: Normal mood and affect. Behavior is normal. Cardiovascular: Normal rate, regular rhythm. No edema Pulmonary/chest: Effort normal and breath sounds normal. No wheezing, rales or rhonchi. Abdominal: Soft, nondistended. Nontender. Bowel sounds active throughout. There are no masses palpable. No hepatomegaly. Rectal:  defered Neurological: Alert and oriented to person place and time. Skin: Skin is warm and dry. No rashes noted.  Data Reviewed: I have personally reviewed following labs and imaging studies  CBC: CBC Latest Ref Rng & Units 04/07/2018 03/27/2018 03/26/2018  WBC 3.8 - 10.8 Thousand/uL 8.6 8.8 12.7(H)  Hemoglobin 11.7 - 15.5 g/dL 11.2(L) 9.1(L) 10.6(L)  Hematocrit 35.0 - 45.0 % 33.0(L) 28.8(L) 32.5(L)  Platelets 140 - 400 Thousand/uL 450(H) 151 134(L)    CMP: CMP Latest Ref Rng & Units 04/07/2018 03/27/2018 03/26/2018  Glucose 65 - 99 mg/dL 89 91 128(H)  BUN 7 - 25 mg/dL 19 9 13   Creatinine 0.60 - 0.93 mg/dL 1.05(H) 0.66 0.88  Sodium 135 - 146 mmol/L 141 138 137  Potassium 3.5 - 5.3 mmol/L 4.4 3.5 3.8  Chloride 98 - 110 mmol/L 108 108 109  CO2 20 - 32 mmol/L 24 23 19(L)  Calcium 8.6 - 10.4 mg/dL 10.4 8.7(L) 8.5(L)  Total Protein 6.1 - 8.1 g/dL 7.4 - -  Total Bilirubin 0.2 - 1.2 mg/dL 0.3 - -  Alkaline Phos 38 - 126 U/L - - -  AST 10 - 35 U/L 22 - -  ALT 6 - 29 U/L 39(H) - -    GFR:     Carmell Austria, MD 05/30/2018, 2:44 PM  Cc: Mackie Pai, PA-C

## 2018-05-31 LAB — CBC WITH DIFFERENTIAL/PLATELET
Basophils Absolute: 0.1 10*3/uL (ref 0.0–0.1)
Basophils Relative: 1 % (ref 0.0–3.0)
Eosinophils Absolute: 0 10*3/uL (ref 0.0–0.7)
Eosinophils Relative: 0.8 % (ref 0.0–5.0)
HCT: 34.8 % — ABNORMAL LOW (ref 36.0–46.0)
HEMOGLOBIN: 11.6 g/dL — AB (ref 12.0–15.0)
Lymphocytes Relative: 35.8 % (ref 12.0–46.0)
Lymphs Abs: 2.2 10*3/uL (ref 0.7–4.0)
MCHC: 33.4 g/dL (ref 30.0–36.0)
MCV: 98.5 fl (ref 78.0–100.0)
MONO ABS: 0.6 10*3/uL (ref 0.1–1.0)
Monocytes Relative: 10.5 % (ref 3.0–12.0)
Neutro Abs: 3.1 10*3/uL (ref 1.4–7.7)
Neutrophils Relative %: 51.9 % (ref 43.0–77.0)
Platelets: 253 10*3/uL (ref 150.0–400.0)
RBC: 3.53 Mil/uL — ABNORMAL LOW (ref 3.87–5.11)
RDW: 15.5 % (ref 11.5–15.5)
WBC: 6.1 10*3/uL (ref 4.0–10.5)

## 2018-05-31 LAB — COMPREHENSIVE METABOLIC PANEL
ALT: 24 U/L (ref 0–35)
AST: 15 U/L (ref 0–37)
Albumin: 4.4 g/dL (ref 3.5–5.2)
Alkaline Phosphatase: 66 U/L (ref 39–117)
BUN: 31 mg/dL — ABNORMAL HIGH (ref 6–23)
CO2: 30 mEq/L (ref 19–32)
Calcium: 10 mg/dL (ref 8.4–10.5)
Chloride: 104 mEq/L (ref 96–112)
Creatinine, Ser: 0.93 mg/dL (ref 0.40–1.20)
GFR: 58.07 mL/min — ABNORMAL LOW (ref >60.00)
Glucose, Bld: 85 mg/dL (ref 70–99)
Potassium: 4.3 mEq/L (ref 3.5–5.1)
SODIUM: 140 meq/L (ref 135–145)
Total Bilirubin: 0.2 mg/dL (ref 0.2–1.2)
Total Protein: 7.2 g/dL (ref 6.0–8.3)

## 2018-05-31 LAB — LIPASE: Lipase: 18 U/L (ref 11.0–59.0)

## 2018-06-01 LAB — CELIAC PANEL 10
ANTIGLIADIN ABS, IGA: 5 U (ref 0–19)
Endomysial IgA: NEGATIVE
Gliadin IgG: 3 units (ref 0–19)
IgA/Immunoglobulin A, Serum: 296 mg/dL (ref 64–422)
Tissue Transglut Ab: <2 U/mL (ref 0–5)
Transglutaminase IgA: <2 U/mL (ref 0–3)

## 2018-06-07 ENCOUNTER — Other Ambulatory Visit: Payer: Self-pay | Admitting: Medical

## 2018-06-13 ENCOUNTER — Other Ambulatory Visit: Payer: Self-pay | Admitting: Medical

## 2018-06-13 DIAGNOSIS — D485 Neoplasm of uncertain behavior of skin: Secondary | ICD-10-CM | POA: Diagnosis not present

## 2018-06-13 DIAGNOSIS — I10 Essential (primary) hypertension: Secondary | ICD-10-CM

## 2018-06-13 DIAGNOSIS — L821 Other seborrheic keratosis: Secondary | ICD-10-CM | POA: Diagnosis not present

## 2018-06-13 DIAGNOSIS — C44319 Basal cell carcinoma of skin of other parts of face: Secondary | ICD-10-CM | POA: Diagnosis not present

## 2018-06-13 DIAGNOSIS — H61001 Unspecified perichondritis of right external ear: Secondary | ICD-10-CM | POA: Diagnosis not present

## 2018-06-17 ENCOUNTER — Other Ambulatory Visit: Payer: Self-pay | Admitting: Medical

## 2018-06-17 MED ORDER — DULOXETINE HCL 60 MG PO CPEP: 60.0000 mg | ORAL_CAPSULE | Freq: Every day | ORAL | 3 refills | Status: DC

## 2018-07-06 ENCOUNTER — Telehealth: Payer: Self-pay | Admitting: Medical

## 2018-07-06 ENCOUNTER — Other Ambulatory Visit: Payer: Self-pay | Admitting: Medical

## 2018-07-06 NOTE — Telephone Encounter (Signed)
So you can cancel my cymbalta script/call pharmacy.

## 2018-07-06 NOTE — Telephone Encounter (Signed)
Copied from [place] [phone]. Topic: General - Other >> Jul 06, 2018 11:42 AM Keene Breath wrote: Reason for CRM: Timmothy Sours from [facility] called to inform the nurse or doctor that she already has a script from her other doctor for the DULoxetine (CYMBALTA) 60 MG capsule, so they would like for the doctor cancel his script.  Please advise and call back at [phone]

## 2018-07-13 ENCOUNTER — Other Ambulatory Visit: Payer: Self-pay | Admitting: Medical

## 2018-08-09 ENCOUNTER — Other Ambulatory Visit: Payer: Self-pay | Admitting: Medical

## 2018-08-09 DIAGNOSIS — I10 Essential (primary) hypertension: Secondary | ICD-10-CM

## 2018-08-12 ENCOUNTER — Other Ambulatory Visit: Payer: Self-pay | Admitting: Medical

## 2018-08-12 DIAGNOSIS — I10 Essential (primary) hypertension: Secondary | ICD-10-CM

## 2018-08-14 ENCOUNTER — Telehealth: Payer: Self-pay | Admitting: Gastroenterology

## 2018-08-14 NOTE — Telephone Encounter (Signed)
Patient is asking for a refill on medication. Claims she has been out. But has finished her medication early by a couple of weeks. Would you like to fill a new rx?

## 2018-08-14 NOTE — Telephone Encounter (Signed)
Patient would like a refill for budesonide (ENTOCORT EC) 3 MG

## 2018-08-15 ENCOUNTER — Encounter: Payer: Self-pay | Admitting: Medical

## 2018-08-15 ENCOUNTER — Ambulatory Visit (INDEPENDENT_AMBULATORY_CARE_PROVIDER_SITE_OTHER): Payer: Medicare Other | Admitting: Medical

## 2018-08-15 ENCOUNTER — Telehealth: Payer: Self-pay | Admitting: Gastroenterology

## 2018-08-15 ENCOUNTER — Telehealth: Payer: Self-pay | Admitting: Medical

## 2018-08-15 ENCOUNTER — Other Ambulatory Visit: Payer: Self-pay

## 2018-08-15 VITALS — BP 135/74 | HR 80

## 2018-08-15 DIAGNOSIS — I10 Essential (primary) hypertension: Secondary | ICD-10-CM

## 2018-08-15 MED ORDER — BUDESONIDE 3 MG PO CPEP: 3.0000 mg | ORAL_CAPSULE | Freq: Every day | ORAL | 0 refills | Status: DC

## 2018-08-15 MED ORDER — AMLODIPINE BESYLATE 5 MG PO TABS: ORAL_TABLET | ORAL | 0 refills | Status: DC

## 2018-08-15 NOTE — Telephone Encounter (Signed)
Pt called wanted to let the doctor know that she has found the budesonide (ENTOCORT EC) 3 MG 24 hr capsule [[id number]]

## 2018-08-15 NOTE — Telephone Encounter (Signed)
Rx sent to pharmacy and patient notified. 14 day supply 0 refills

## 2018-08-15 NOTE — Progress Notes (Addendum)
Subjective:    Patient ID: Rose [name], female    DOB: 04/27/1940, 78 y.o.   MRN: [id number]  HPI  Virtual Visit via Telephone Note  I connected with Rose [name] on 08/15/18 at 10:40 AM EDT by telephone and verified that I am speaking with the correct person using two identifiers.  Location: Patient: home Provider: home  Pt bp and pulse done. She did not weight herself. No scale at home.   I discussed the limitations, risks, security and privacy concerns of performing an evaluation and management service by telephone and the availability of in person appointments. I also discussed with the patient that there may be a patient responsible charge related to this service. The patient expressed understanding and agreed to proceed.   History of Present Illness:   Pt bp is high today. She only checked today for visit. She does not check bp on regularly. No cardiac or neurologic signs or symptoms. '  Pt checked bp second time while we were on the phone. Second check 168/86.   3rd reading was 135/74  Pt is on losartan 100 mg daily, amlodipine 5 mg daily and taking clonidine 0.1 mg tid.     Observations/Objective: General- no acute distress, pleasant. Normal speech. Oriented.  Assessment and Plan: Your bp was elevated today on 2 separate readings. The 3rd reading was good at 134/75. So I am going to give amlodipine rx for 5 mg instruction 1-2 tab po q day. Check bp daily 3 consecutive times in a row daily. If by less than 140/90 just use one tab. If over 140/90 then use 2 tabs of 5 mg or 10 mg.  Follow up by phone call update in 10 days as I want to know what bp readings were and how many days you had to use 10 mg dose. Continue other bp meds the same.  15 minutes appointment spent with pt.  Mackie Pai, PA-C  Follow Up Instructions:    I discussed the assessment and treatment plan with the patient. The patient was provided an opportunity to ask questions and all  were answered. The patient agreed with the plan and demonstrated an understanding of the instructions.   The patient was advised to call back or seek an in-person evaluation if the symptoms worsen or if the condition fails to improve as anticipated.     Mackie Pai, PA-C   Review of Systems  Constitutional: Negative for chills, fatigue and fever.  Respiratory: Negative for cough, chest tightness, shortness of breath and wheezing.   Cardiovascular: Negative for chest pain and palpitations.  Gastrointestinal: Negative for abdominal pain.  Neurological: Negative for dizziness, seizures, syncope, weakness and headaches.  Hematological: Negative for adenopathy. Does not bruise/bleed easily.  Psychiatric/Behavioral: Negative for behavioral problems and dysphoric mood. The patient is not nervous/anxious.    Past Medical History:  Diagnosis Date  . Abnormal blood pressure    left arm from old brachial artery repair  . Allergy   . Anemia   . Anxiety   . Atherosclerosis of renal artery ([place])   . Attention deficit disorder without mention of hyperactivity   . Basal cell carcinoma of face   . Benign heart murmur   . Blood transfusion without reported diagnosis   . Carotid artery disease ([place])   . Carotid bruit    Left  . Chronic bronchitis ([place])   . Depression   . DJD (degenerative joint disease) of hip   . Esophageal reflux   .  History of depression   . History of spinal fusion   . Hypertension   . Morton's neuroma    Hx of, Left, 2 on right   . Myalgia and myositis, unspecified   . Other tenosynovitis of hand and wrist   . Pancreatitis    03-2018  . Peripheral neuropathy   . Personal history of alcoholism ([place])   . Personal history of peptic ulcer disease   . Pulmonary nodule   . Rotator cuff disorder    pain  . Solitary cyst of breast   . Subclavian steal syndrome   . Substance abuse ([place])   . Unspecified hypothyroidism   . Urinary incontinence   . Wears glasses       Social History   Socioeconomic History  . Marital status: Single    Spouse name: Not on file  . Number of children: Y  . Years of education: Not on file  . Highest education level: Not on file  Occupational History  . Occupation: retired    Fish farm manager: RETIRED    CommentClinical cytogeneticist, had own firm  Social Needs  . Financial resource strain: Not on file  . Food insecurity:    Worry: Not on file    Inability: Not on file  . Transportation needs:    Medical: Not on file    Non-medical: Not on file  Tobacco Use  . Smoking status: Former Smoker    Packs/day: 1.00    Years: 25.00    Pack years: 25.00    Types: Cigarettes    Last attempt to quit: 03/16/1983    Years since quitting: 35.4  . Smokeless tobacco: Never Used  Substance and Sexual Activity  . Alcohol use: Not Currently    Comment: 1-2 glasses wine daily  . Drug use: No  . Sexual activity: Never    Comment: 1st intercourse 78 yo-Fewer than 5 partners  Lifestyle  . Physical activity:    Days per week: Not on file    Minutes per session: Not on file  . Stress: Not on file  Relationships  . Social connections:    Talks on phone: Not on file    Gets together: Not on file    Attends religious service: Not on file    Active member of club or organization: Not on file    Attends meetings of clubs or organizations: Not on file    Relationship status: Not on file  . Intimate partner violence:    Fear of current or ex partner: Not on file    Emotionally abused: Not on file    Physically abused: Not on file    Forced sexual activity: Not on file  Other Topics Concern  . 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                   Past Surgical History:   Procedure Laterality Date  . APPENDECTOMY    . CARPOMETACARPEL SUSPENSION PLASTY Right 08/07/2013   Procedure: CARPOMETACARPEL [facility]) SUSPENSION PLASTY RIGHT THUMB;  Surgeon: Wynonia Sours, MD;  Location: [place];  Service: Orthopedics;  Laterality: Right;  . CARPOMETACARPEL SUSPENSION PLASTY  Left 10/23/2015   Procedure: SUSPENSION PLASTY LEFT THUMB TRAPEZIUM EXCISION ABDUCTOR POLLICIS LONGUS TRANSFER;  Surgeon: Daryll Brod, MD;  Location: [place];  Service: Orthopedics;  Laterality: Left;  . CERVICAL FUSION  2007   Dr. Valli Glance approach  . COLONOSCOPY    . JOINT REPLACEMENT Left   . Left brachial artery repair of pseudoaneurysm  2001   post a cath  . LUMBAR FUSION  09/2004   T12-L5 (Dr. Patrice Paradise)  . MINOR IRRIGATION AND DEBRIDEMENT OF WOUND Right 08/27/2014   Procedure: MINOR IRRIGATION AND DEBRIDEMENT OF WOUND;  Surgeon: Daryll Brod, MD;  Location: [place];  Service: Orthopedics;  Laterality: Right;  . ORIF TIBIA FRACTURE  2010   Left distal  . Percutanous Transluminal Angioplasty of Renal Arteris    . REPAIR EXTENSOR TENDON Right 09/26/2014   Procedure: REPAIR EXTENSOR TENDON RIGHT RING FINGER ;  Surgeon: Daryll Brod, MD;  Location: [place];  Service: Orthopedics;  Laterality: Right;  . SHOULDER ARTHROSCOPY  09/2008   Left  . TONSILLECTOMY AND ADENOIDECTOMY    . TOTAL HIP ARTHROPLASTY  2008   left  . WISDOM TOOTH EXTRACTION      Family History  Adopted: Yes  Problem Relation Age of Onset  . Schizophrenia Son        Deceased 80  . Hypertension Daughter        Renal  . Healthy Son        x1  . Healthy Daughter        x2    Allergies  Allergen Reactions  . Amoxicillin     REACTION: Rash  DID THE REACTION INVOLVE: Swelling of the face/tongue/throat, SOB, or low BP? No Sudden or severe rash/hives, skin peeling, or the inside of the mouth or nose? Yes Did it require medical treatment? No When did it  last happen?1990s If all above answers are "NO", may proceed with cephalosporin use.   . Codeine Hives  . Hydrocodone-Acetaminophen     REACTION: itching  . Hydromorphone Hcl   . Morphine And Related Hives and Itching  . Morphine Sulfate     REACTION: unspecified  . Nsaids     Ulcer    Current Outpatient Medications on File Prior to Visit  Medication Sig Dispense Refill  . alendronate (FOSAMAX) 70 MG tablet TAKE 1 TABLET BY MOUTH ONCE WEEKLY BEFORE BREAKFAST, ON AN EMPTY STOMACH: REMAIN UPRIGHT FOR 30 MINUTES:TAKE WITH 8 OUNCES OF WATER (Patient taking differently: Take 70 mg by mouth once a week. TAKE 1 TABLET BY MOUTH ONCE WEEKLY BEFORE BREAKFAST, ON AN EMPTY STOMACH: REMAIN UPRIGHT FOR 30 MINUTES:TAKE WITH 8 OUNCES OF WATER  Each Sunday) 4 tablet 12  . amLODipine (NORVASC) 5 MG tablet TAKE ONE TABLET BY MOUTH TWICE A DAY 180 tablet 0  . aspirin 81 MG tablet Take 81 mg by mouth daily.     Marland Kitchen atomoxetine (STRATTERA) 100 MG capsule Take 100 mg by mouth daily.    . budesonide (ENTOCORT EC) 3 MG 24 hr capsule Take 3 tabs daily x 8 weeks, if not better-then begin taking 2 tabs daily x 6 weeks, then 1 tab daily x 2 weeks; 266 capsule 0  . buPROPion (WELLBUTRIN) 75 MG tablet Take 1 tablet (75 mg total) by mouth 2 (two) times daily. 20 tablet 0  . cholestyramine (QUESTRAN) 4 g packet Take 1 packet (4 g total) by mouth 2 (two) times daily. Must take medication 2 hours before or  after other medications; 60 each 12  . cloNIDine (CATAPRES) 0.1 MG tablet Take 0.1 mg by mouth 3 (three) times daily.    . DULoxetine (CYMBALTA) 60 MG capsule Take 1 capsule (60 mg total) by mouth daily. 90 capsule 3  . ferrous sulfate dried (SLOW FE) 160 (50 FE) MG TBCR Take 160 mg by mouth 2 (two) times daily. Patient has been taking Once Daily    . Glucosamine 500 MG TABS Take 1 tablet by mouth daily. Glucosamine-Chondronton    . levothyroxine (SYNTHROID, LEVOTHROID) 125 MCG tablet TAKE ONE TABLET BY MOUTH DAILY  90 tablet 2  . losartan (COZAAR) 100 MG tablet Take 1 tablet (100 mg total) by mouth daily. 90 tablet 0  . oxybutynin (DITROPAN-XL) 10 MG 24 hr tablet TAKE ONE TABLET BY MOUTH EVERY NIGHT AT BEDTIME 30 tablet 11   No current facility-administered medications on file prior to visit.     BP (!) 151/80   Pulse 80       Objective:   Physical Exam        Assessment & Plan:

## 2018-08-15 NOTE — Telephone Encounter (Signed)
SPOKE WITH PT, PT STATS SHE DID NOT NEED ANOTHER APPT AND SHE WOULD JUST CALL AND PROVIDE THE BP NUMBERS

## 2018-08-15 NOTE — Telephone Encounter (Signed)
Please call in budesonide 3 mg po qd x 2 weeks.  Then, if no diarrhea she can stop.

## 2018-08-15 NOTE — Patient Instructions (Signed)
Your bp was elevated today on 2 separate readings. The 3rd reading was good at 134/75. So I am going to give amlodipine rx for 5 mg instruction 1-2 tab po q day. Check bp daily 3 consecutive times in a row daily. If by less than 140/90 just use one tab. If over 140/90 then use 2 tabs of 5 mg or 10 mg.  Follow up by phone call update in 10 days as I want to know what bp readings were and how many days you had to use 10 mg dose. Continue other bp meds the same.

## 2018-08-24 ENCOUNTER — Encounter: Payer: Self-pay | Admitting: Gastroenterology

## 2018-09-05 ENCOUNTER — Encounter: Payer: Self-pay | Admitting: Gastroenterology

## 2018-09-13 ENCOUNTER — Telehealth: Payer: Self-pay

## 2018-09-13 DIAGNOSIS — D649 Anemia, unspecified: Secondary | ICD-10-CM

## 2018-09-13 DIAGNOSIS — R944 Abnormal results of kidney function studies: Secondary | ICD-10-CM

## 2018-09-13 NOTE — Telephone Encounter (Signed)
Copied from [place] [id number]. Topic: Appointment Scheduling - Scheduling Inquiry for Clinic >> Sep 12, 2018  4:22 PM Yvette Rack wrote: Reason for CRM: Pt would like to schedule lab appt. Pt requests call back

## 2018-09-13 NOTE — Telephone Encounter (Signed)
Pt last labs done in march does pt need appointment to get lab work done?

## 2018-09-14 NOTE — Telephone Encounter (Signed)
Future labs placed. You could get pt scheduled for those.

## 2018-09-14 NOTE — Addendum Note (Signed)
Addended by: Anabel Halon on: 09/14/2018 04:07 PM   Modules accepted: Orders

## 2018-09-18 ENCOUNTER — Other Ambulatory Visit: Payer: Medicare Other

## 2018-09-18 ENCOUNTER — Other Ambulatory Visit: Payer: Self-pay

## 2018-09-18 DIAGNOSIS — Z5181 Encounter for therapeutic drug level monitoring: Secondary | ICD-10-CM | POA: Diagnosis not present

## 2018-09-18 DIAGNOSIS — Z13220 Encounter for screening for lipoid disorders: Secondary | ICD-10-CM | POA: Diagnosis not present

## 2018-09-18 DIAGNOSIS — Z1329 Encounter for screening for other suspected endocrine disorder: Secondary | ICD-10-CM | POA: Diagnosis not present

## 2018-09-29 ENCOUNTER — Telehealth: Payer: Medicare Other | Admitting: Gastroenterology

## 2018-10-13 ENCOUNTER — Ambulatory Visit (INDEPENDENT_AMBULATORY_CARE_PROVIDER_SITE_OTHER): Payer: Medicare Other | Admitting: Medical

## 2018-10-13 ENCOUNTER — Telehealth: Payer: Self-pay | Admitting: Medical

## 2018-10-13 ENCOUNTER — Other Ambulatory Visit: Payer: Self-pay | Admitting: Medical

## 2018-10-13 ENCOUNTER — Other Ambulatory Visit: Payer: Self-pay

## 2018-10-13 VITALS — BP 118/47 | HR 42 | Temp 98.0°F | Resp 14 | Ht 63.0 in | Wt 120.4 lb

## 2018-10-13 DIAGNOSIS — R4182 Altered mental status, unspecified: Secondary | ICD-10-CM

## 2018-10-13 DIAGNOSIS — R509 Fever, unspecified: Secondary | ICD-10-CM | POA: Diagnosis not present

## 2018-10-13 DIAGNOSIS — R6889 Other general symptoms and signs: Secondary | ICD-10-CM

## 2018-10-13 DIAGNOSIS — R05 Cough: Secondary | ICD-10-CM | POA: Diagnosis not present

## 2018-10-13 DIAGNOSIS — R413 Other amnesia: Secondary | ICD-10-CM | POA: Diagnosis not present

## 2018-10-13 DIAGNOSIS — R059 Cough, unspecified: Secondary | ICD-10-CM

## 2018-10-13 LAB — COMPREHENSIVE METABOLIC PANEL
ALT: 59 U/L — ABNORMAL HIGH (ref 0–35)
AST: 28 U/L (ref 0–37)
Albumin: 3.7 g/dL (ref 3.5–5.2)
Alkaline Phosphatase: 51 U/L (ref 39–117)
BUN: 25 mg/dL — ABNORMAL HIGH (ref 6–23)
CO2: 28 mEq/L (ref 19–32)
Calcium: 9.8 mg/dL (ref 8.4–10.5)
Chloride: 109 mEq/L (ref 96–112)
Creatinine, Ser: 0.97 mg/dL (ref 0.40–1.20)
GFR: 55.26 mL/min — ABNORMAL LOW (ref >60.00)
Glucose, Bld: 101 mg/dL — ABNORMAL HIGH (ref 70–99)
Potassium: 4.6 mEq/L (ref 3.5–5.1)
Sodium: 144 mEq/L (ref 135–145)
Total Bilirubin: 0.3 mg/dL (ref 0.2–1.2)
Total Protein: 6.3 g/dL (ref 6.0–8.3)

## 2018-10-13 LAB — VITAMIN B12: Vitamin B-12: 719 pg/mL (ref 211–911)

## 2018-10-13 NOTE — Patient Instructions (Addendum)
For described memory loss, I did a Mini-Mental status exam and he scored 27 out of 30.  This is pretty good score but functionally describe more memory difficulty.  I do think is a good idea to get a metabolic panel, R60 and B1 level.  In addition I will go ahead and refer you to neurologist for further mental status examination.  Also neurologist might be more labs and may include imaging studies.  During interim if your mental status worsens or changes please notify us.   In addition did later decide to order COVID antibody test.  She did mention illness 3 weeks ago for which she is now completely better.  Some of her memory loss/altered mental status/potential follow-up might be related to co-infection?.  Will place future order and asked staff to get her scheduled to come back sometime next week for testing.  No acute symptoms presently indicating acute infection.  Follow-up date to be determined after referral to neurologist.

## 2018-10-13 NOTE — Progress Notes (Signed)
Subjective:    Patient ID: Rose [name], female    DOB: [birth year], 78 y.o.   MRN: [id number]  HPI  Pt bp is well controlled today.She checks bp at home and states it is well controlled. Pulse low initially when MA checked her today. She states when checks bp she does notice had pulse reading. She states not fatigued and no palpitations. She feels pulse is normal.  Pt states that recently over past 4-5 weeks having memory issues. She states she reads books and now is forgetting what she reads at most will remember one day. Pt states she has read a lot in the past and did not have issues like this in past.   Pt does recognize faces but can't remember persons names. She states has been like that for year but worse recently.  Pt does not drive. Pt does not walk much. When driven around block or here recogizes/ remembers directions.  She states difficulty remembers prayers that she recited frequently.  Pt states  3 weeks ago she had fever, cough and chills. She had 8 days symptoms then resolved.  Pt has not drank any alcohol. Hx of.     Review of Systems  Constitutional: Negative for chills, fatigue and fever.  Respiratory: Negative for cough, chest tightness, shortness of breath and wheezing.   Cardiovascular: Negative for chest pain and palpitations.  Gastrointestinal: Negative for abdominal pain, constipation, nausea and vomiting.  Genitourinary: Negative for difficulty urinating, enuresis, frequency, menstrual problem, urgency and vaginal bleeding.  Musculoskeletal: Negative for back pain and joint swelling.  Skin: Negative for rash and wound.  Neurological: Negative for dizziness, speech difficulty, weakness and headaches.  Hematological: Negative for adenopathy. Does not bruise/bleed easily.  Psychiatric/Behavioral: Negative for behavioral problems, confusion, dysphoric mood and sleep disturbance. The patient is not nervous/anxious.     Past Medical History:  Diagnosis  Date  . Abnormal blood pressure    left arm from old brachial artery repair  . Allergy   . Anemia   . Anxiety   . Atherosclerosis of renal artery ([place])   . Attention deficit disorder without mention of hyperactivity   . Basal cell carcinoma of face   . Benign heart murmur   . Blood transfusion without reported diagnosis   . Carotid artery disease ([place])   . Carotid bruit    Left  . Chronic bronchitis ([place])   . Depression   . DJD (degenerative joint disease) of hip   . Esophageal reflux   . History of depression   . History of spinal fusion   . Hypertension   . Morton's neuroma    Hx of, Left, 2 on right   . Myalgia and myositis, unspecified   . Other tenosynovitis of hand and wrist   . Pancreatitis    03-2018  . Peripheral neuropathy   . Personal history of alcoholism ([place])   . Personal history of peptic ulcer disease   . Pulmonary nodule   . Rotator cuff disorder    pain  . Solitary cyst of breast   . Subclavian steal syndrome   . Substance abuse ([place])   . Unspecified hypothyroidism   . Urinary incontinence   . Wears glasses      Social History   Socioeconomic History  . Marital status: Single    Spouse name: Not on file  . Number of children: Y  . Years of education: Not on file  . Highest education level: Not on file  Occupational History  . Occupation: retired    Fish farm manager: RETIRED    CommentClinical cytogeneticist, had own firm  Social Needs  . Financial resource strain: Not on file  . Food insecurity    Worry: Not on file    Inability: Not on file  . Transportation needs    Medical: Not on file    Non-medical: Not on file  Tobacco Use  . Smoking status: Former Smoker    Packs/day: 1.00    Years: 25.00    Pack years: 25.00    Types: Cigarettes    Quit date: 03/16/1983    Years since quitting: 35.6  . Smokeless tobacco: Never Used  Substance and Sexual Activity  . Alcohol use: Not Currently    Comment: 1-2 glasses wine daily  . Drug use: No  . Sexual activity:  Never    Comment: 1st intercourse 78 yo-Fewer than 5 partners  Lifestyle  . Physical activity    Days per week: Not on file    Minutes per session: Not on file  . Stress: Not on file  Relationships  . Social Herbalist on phone: Not on file    Gets together: Not on file    Attends religious service: Not on file    Active member of club or organization: Not on file    Attends meetings of clubs or organizations: Not on file    Relationship status: Not on file  . Intimate partner violence    Fear of current or ex partner: Not on file    Emotionally abused: Not on file    Physically abused: Not on file    Forced sexual activity: Not on file  Other Topics Concern  . 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                   Past Surgical History:  Procedure Laterality Date  . APPENDECTOMY    . CARPOMETACARPEL SUSPENSION PLASTY Right 08/07/2013   Procedure: CARPOMETACARPEL [facility]) SUSPENSION PLASTY RIGHT THUMB;  Surgeon: Wynonia Sours, MD;  Location: [place];  Service: Orthopedics;  Laterality: Right;  . CARPOMETACARPEL SUSPENSION PLASTY Left 10/23/2015   Procedure: SUSPENSION PLASTY LEFT THUMB TRAPEZIUM EXCISION ABDUCTOR POLLICIS LONGUS TRANSFER;  Surgeon: Daryll Brod, MD;  Location: [place];  Service: Orthopedics;  Laterality: Left;  . CERVICAL FUSION  2007   Dr. Valli Glance approach  . COLONOSCOPY    . JOINT REPLACEMENT Left   . Left brachial artery repair of pseudoaneurysm  2001   post a cath  . LUMBAR FUSION  09/2004   T12-L5 (Dr. Patrice Paradise)  . MINOR IRRIGATION AND DEBRIDEMENT OF WOUND Right 08/27/2014   Procedure: MINOR IRRIGATION AND DEBRIDEMENT OF WOUND;  Surgeon: Daryll Brod, MD;  Location: [place];  Service: Orthopedics;  Laterality: Right;  . ORIF TIBIA FRACTURE  2010   Left distal  . Percutanous Transluminal Angioplasty of Renal Arteris    . REPAIR EXTENSOR TENDON Right 09/26/2014   Procedure: REPAIR EXTENSOR TENDON RIGHT RING FINGER ;  Surgeon: Daryll Brod, MD;  Location: [place];  Service: Orthopedics;  Laterality:  Right;  Marland Kitchen SHOULDER ARTHROSCOPY  09/2008   Left  . TONSILLECTOMY AND ADENOIDECTOMY    . TOTAL HIP ARTHROPLASTY  2008   left  . WISDOM TOOTH EXTRACTION      Family History  Adopted: Yes  Problem Relation Age of Onset  . Schizophrenia Son        Deceased 82  . Hypertension Daughter        Renal  . Healthy Son        x1  . Healthy Daughter        x2    Allergies  Allergen Reactions  . Amoxicillin     REACTION: Rash  DID THE REACTION INVOLVE: Swelling of the face/tongue/throat, SOB, or low BP? No Sudden or severe rash/hives, skin peeling, or the inside of the mouth or nose? Yes Did it require medical treatment? No When did it last happen?1990s If all above answers are "NO", may proceed with cephalosporin use.   . Codeine Hives  . Hydrocodone-Acetaminophen     REACTION: itching  . Hydromorphone Hcl   . Morphine And Related Hives and Itching  . Morphine Sulfate     REACTION: unspecified  . Nsaids     Ulcer    Current Outpatient Medications on File Prior to Visit  Medication Sig Dispense Refill  . alendronate (FOSAMAX) 70 MG tablet TAKE 1 TABLET BY MOUTH ONCE WEEKLY BEFORE BREAKFAST, ON AN EMPTY STOMACH: REMAIN UPRIGHT FOR 30 MINUTES:TAKE WITH 8 OUNCES OF WATER (Patient taking differently: Take 70 mg by mouth once a week. TAKE 1 TABLET BY MOUTH ONCE WEEKLY BEFORE BREAKFAST, ON AN EMPTY STOMACH: REMAIN UPRIGHT FOR 30 MINUTES:TAKE WITH 8 OUNCES OF WATER  Each Sunday) 4 tablet 12  . amLODipine (NORVASC) 5 MG tablet 1-2 tab po daily 60 tablet 0  . aspirin 81 MG tablet Take 81 mg by  mouth daily.     Marland Kitchen atomoxetine (STRATTERA) 100 MG capsule Take 100 mg by mouth daily.    . budesonide (ENTOCORT EC) 3 MG 24 hr capsule Take 3 tabs daily x 8 weeks, if not better-then begin taking 2 tabs daily x 6 weeks, then 1 tab daily x 2 weeks; 266 capsule 0  . budesonide (ENTOCORT EC) 3 MG 24 hr capsule Take 1 capsule (3 mg total) by mouth daily. 14 capsule 0  . buPROPion (WELLBUTRIN) 75 MG tablet Take 1 tablet (75 mg total) by mouth 2 (two) times daily. 20 tablet 0  . cholestyramine (QUESTRAN) 4 g packet Take 1 packet (4 g total) by mouth 2 (two) times daily. Must take medication 2 hours before or after other medications; 60 each 12  . cloNIDine (CATAPRES) 0.1 MG tablet Take 0.1 mg by mouth 3 (three) times daily.    . DULoxetine (CYMBALTA) 60 MG capsule Take 1 capsule (60 mg total) by mouth daily. 90 capsule 3  . ferrous sulfate dried (SLOW FE) 160 (50 FE) MG TBCR Take 160 mg by mouth 2 (two) times daily. Patient has been taking Once Daily    . Glucosamine 500 MG TABS Take 1 tablet by mouth daily. Glucosamine-Chondronton    . levothyroxine (SYNTHROID, LEVOTHROID) 125 MCG tablet TAKE ONE TABLET BY MOUTH DAILY 90 tablet 2  . losartan (COZAAR) 100 MG tablet Take 1 tablet (100 mg total) by mouth daily. 90 tablet 0  . oxybutynin (DITROPAN-XL) 10 MG 24 hr tablet TAKE ONE TABLET BY MOUTH EVERY NIGHT AT BEDTIME 30 tablet 11   No current facility-administered medications on file  prior to visit.     BP (!) 118/47   Pulse (!) 42   Temp 98 F (36.7 C) (Oral)   Resp 14   Ht 5\' 3"  (1.6 m)   Wt 120 lb 6.4 oz (54.6 kg)   SpO2 95%   BMI 21.33 kg/m       Objective:   Physical Exam  General Mental Status- Alert. General Appearance- Not in acute distress.   Skin General: Color- Normal Color. Moisture- Normal Moisture.  Neck Carotid Arteries- Normal color. Moisture- Normal Moisture. No carotid bruits. No JVD.  Chest and Lung Exam Auscultation: Breath Sounds:-Normal.  Cardiovascular  Auscultation:Rythm- Regular. Murmurs & Other Heart Sounds:Auscultation of the heart reveals- No Murmurs.  Abdomen Inspection:-Inspeection Normal. Palpation/Percussion:Note:No mass. Palpation and Percussion of the abdomen reveal- Non Tender, Non Distended + BS, no rebound or guarding.    Neurologic Cranial Nerve exam:- CN III-XII intact(No nystagmus), symmetric smile. Finger to Nose:- Normal/Intact Strength:- 5/5 equal and symmetric strength both upper and lower extremities.      Assessment & Plan:  For described memory loss, I did a Mini-Mental status exam and he scored 27 out of 30.  This is pretty good score but functionally describe more memory difficulty.  I do think is a good idea to get a metabolic panel, E75 and B1 level.  In addition I will go ahead and refer you to neurologist for further mental status examination.  Also neurologist might be more labs and may include imaging studies.  During interim if your mental status worsens or changes please notify us.  In addition did later decide to order COVID antibody test.  She did mention illness 3 weeks ago for which she is now completely better.  Some of her memory loss/altered mental status/potential follow-up might be related to co-infection?.  Will place future order and asked staff to get her scheduled to come back sometime next week for testing.  No acute symptoms presently indicating acute infection.   Follow-up date to be determined after referral to neurologist.  25 minutes spent with patient.  50% of time spent counseling patient on plan going forward.  Mackie Pai, PA-C

## 2018-10-13 NOTE — Telephone Encounter (Signed)
Would you call patient and asked that she come in sometime next week to get COVID antibody test.  She mentioned some recent symptoms 3 weeks ago for which he is now better.  However she was reporting memory issues and I started to think some patients complete complain of post covid mental fog.  This is unlikely but think it might be worthwhile doing in the event the test showed she had previous infection.  Already placed the future order.

## 2018-10-16 NOTE — Telephone Encounter (Signed)
Pt returned call to the office. Pt requests call back.

## 2018-10-16 NOTE — Telephone Encounter (Signed)
LVM for patient to return call to schedule lab appointment. 

## 2018-10-17 LAB — VITAMIN B1: Vitamin B1 (Thiamine): 17 nmol/L (ref 8–30)

## 2018-10-18 ENCOUNTER — Other Ambulatory Visit (INDEPENDENT_AMBULATORY_CARE_PROVIDER_SITE_OTHER): Payer: Medicare Other

## 2018-10-18 ENCOUNTER — Other Ambulatory Visit: Payer: Self-pay

## 2018-10-18 DIAGNOSIS — R509 Fever, unspecified: Secondary | ICD-10-CM

## 2018-10-19 LAB — SAR COV2 SEROLOGY (COVID19)AB(IGG),IA: SARS CoV2 AB IGG: POSITIVE — AB

## 2018-10-23 ENCOUNTER — Ambulatory Visit: Payer: Medicare Other | Admitting: Gastroenterology

## 2018-10-23 ENCOUNTER — Ambulatory Visit: Payer: Self-pay | Admitting: *Deleted

## 2018-10-27 NOTE — Progress Notes (Addendum)
Virtual Visit via Video Note  I connected with patient on 10/30/18 at  3:00 PM EDT by audio enabled telemedicine application and verified that I am speaking with the correct person using two identifiers.   THIS ENCOUNTER IS A VIRTUAL VISIT DUE TO COVID-19 - PATIENT WAS NOT SEEN IN THE OFFICE. PATIENT HAS CONSENTED TO VIRTUAL VISIT / TELEMEDICINE VISIT   Location of patient: home  Location of provider: office  I discussed the limitations of evaluation and management by telemedicine and the availability of in person appointments. The patient expressed understanding and agreed to proceed.   Subjective:   TANARA TURVEY is a 78 y.o. female who presents for Medicare Annual (Subsequent) preventive examination.  Review of Systems: No ROS.  Medicare Wellness Virtual Visit.  Visual/audio telehealth visit, UTA vital signs.   See social history for additional risk factors. Cardiac Risk Factors include: advanced age (>26men, >77 women);hypertension Home Safety/Smoke Alarms: Feels safe in home. Smoke alarms in place.  Lives alone in first floor apt. Step over tub w/ grab rail.   Female:       Mammo- declines       Dexa scan- declines     Objective:     Advanced Directives 10/30/2018 03/25/2018 03/24/2018 10/20/2017 11/19/2016 09/29/2016 06/10/2016  Does Patient Have a Medical Advance Directive? Yes Yes Yes Yes Yes Yes Yes  Type of Paramedic of [place];Living will [place];Living will [place];Living will [place];Living will [place];Living will [place];Living will [place];Living will  Does patient want to make changes to medical advance directive? No - Patient declined No - Patient declined - No - Patient declined - - -  Copy of [place] in Chart? No - copy requested No - copy requested - No - copy requested No - copy requested - No  - copy requested  Would patient like information on creating a medical advance directive? - - - - - - -    Tobacco Social History   Tobacco Use  Smoking Status Former Smoker  . Packs/day: 1.00  . Years: 25.00  . Pack years: 25.00  . Types: Cigarettes  . Quit date: 03/16/1983  . Years since quitting: 35.6  Smokeless Tobacco Never Used     Counseling given: Not Answered   Clinical Intake:     Pain : No/denies pain   Past Medical History:  Diagnosis Date  . Abnormal blood pressure    left arm from old brachial artery repair  . Allergy   . Anemia   . Anxiety   . Atherosclerosis of renal artery ([place])   . Attention deficit disorder without mention of hyperactivity   . Basal cell carcinoma of face   . Benign heart murmur   . Blood transfusion without reported diagnosis   . Carotid artery disease ([place])   . Carotid bruit    Left  . Chronic bronchitis ([place])   . Depression   . DJD (degenerative joint disease) of hip   . Esophageal reflux   . History of depression   . History of spinal fusion   . Hypertension   . Morton's neuroma    Hx of, Left, 2 on right   . Myalgia and myositis, unspecified   . Other tenosynovitis of hand and wrist   . Pancreatitis    03-2018  . Peripheral neuropathy   . Personal history of alcoholism ([place])   .  Personal history of peptic ulcer disease   . Pulmonary nodule   . Rotator cuff disorder    pain  . Solitary cyst of breast   . Subclavian steal syndrome   . Substance abuse ([place])   . Unspecified hypothyroidism   . Urinary incontinence   . Wears glasses    Past Surgical History:  Procedure Laterality Date  . APPENDECTOMY    . CARPOMETACARPEL SUSPENSION PLASTY Right 08/07/2013   Procedure: CARPOMETACARPEL [facility]) SUSPENSION PLASTY RIGHT THUMB;  Surgeon: Wynonia Sours, MD;  Location: [facility];  Service: Orthopedics;  Laterality: Right;  . CARPOMETACARPEL SUSPENSION PLASTY Left 10/23/2015   Procedure: SUSPENSION PLASTY LEFT  THUMB TRAPEZIUM EXCISION ABDUCTOR POLLICIS LONGUS TRANSFER;  Surgeon: Daryll Brod, MD;  Location: [facility];  Service: Orthopedics;  Laterality: Left;  . CERVICAL FUSION  2007   Dr. Valli Glance approach  . COLONOSCOPY    . JOINT REPLACEMENT Left   . Left brachial artery repair of pseudoaneurysm  2001   post a cath  . LUMBAR FUSION  09/2004   T12-L5 (Dr. Patrice Paradise)  . MINOR IRRIGATION AND DEBRIDEMENT OF WOUND Right 08/27/2014   Procedure: MINOR IRRIGATION AND DEBRIDEMENT OF WOUND;  Surgeon: Daryll Brod, MD;  Location: [facility];  Service: Orthopedics;  Laterality: Right;  . ORIF TIBIA FRACTURE  2010   Left distal  . Percutanous Transluminal Angioplasty of Renal Arteris    . REPAIR EXTENSOR TENDON Right 09/26/2014   Procedure: REPAIR EXTENSOR TENDON RIGHT RING FINGER ;  Surgeon: Daryll Brod, MD;  Location: [facility];  Service: Orthopedics;  Laterality: Right;  . SHOULDER ARTHROSCOPY  09/2008   Left  . TONSILLECTOMY AND ADENOIDECTOMY    . TOTAL HIP ARTHROPLASTY  2008   left  . WISDOM TOOTH EXTRACTION     Family History  Adopted: Yes  Problem Relation Age of Onset  . Schizophrenia Son        Deceased 70  . Hypertension Daughter        Renal  . Healthy Son        x1  . Healthy Daughter        x2   Social History   Socioeconomic History  . Marital status: Single    Spouse name: Not on file  . Number of children: Y  . Years of education: Not on file  . Highest education level: Not on file  Occupational History  . Occupation: retired    Fish farm manager: RETIRED    CommentClinical cytogeneticist, had own firm  Social Needs  . Financial resource strain: Not on file  . Food insecurity    Worry: Not on file    Inability: Not on file  . Transportation needs    Medical: Not on file    Non-medical: Not on file  Tobacco Use  . Smoking status: Former Smoker    Packs/day: 1.00    Years: 25.00    Pack years: 25.00    Types: Cigarettes    Quit date: 03/16/1983     Years since quitting: 35.6  . Smokeless tobacco: Never Used  Substance and Sexual Activity  . Alcohol use: Not Currently    Comment: 1-2 glasses wine daily  . Drug use: No  . Sexual activity: Never    Comment: 1st intercourse 78 yo-Fewer than 5 partners  Lifestyle  . Physical activity    Days per week: Not on file    Minutes per session: Not on file  .  Stress: Not on file  Relationships  . Social Herbalist on phone: Not on file    Gets together: Not on file    Attends religious service: Not on file    Active member of club or organization: Not on file    Attends meetings of clubs or organizations: Not on file    Relationship status: Not on file  Other Topics Concern  . 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                   Outpatient Encounter Medications as of 10/30/2018  Medication Sig  . alendronate (FOSAMAX) 70 MG tablet TAKE 1 TABLET BY MOUTH ONCE WEEKLY BEFORE BREAKFAST, ON AN EMPTY STOMACH: REMAIN UPRIGHT FOR 30 MINUTES:TAKE WITH 8 OUNCES OF WATER (Patient taking differently: Take 70 mg by mouth once a week. TAKE 1 TABLET BY MOUTH ONCE WEEKLY BEFORE BREAKFAST, ON AN EMPTY STOMACH: REMAIN UPRIGHT FOR 30 MINUTES:TAKE WITH 8 OUNCES OF WATER  Each Sunday)  . amLODipine (NORVASC) 5 MG tablet 1-2 tab po daily  . aspirin 81 MG tablet Take 81 mg by mouth daily.   Marland Kitchen atomoxetine (STRATTERA) 100 MG capsule Take 100 mg by mouth daily.  . cloNIDine (CATAPRES) 0.1 MG tablet Take 0.1 mg by mouth 3 (three) times daily.  . DULoxetine (CYMBALTA) 60 MG capsule Take 1 capsule (60 mg total) by mouth daily.  . ferrous sulfate dried (SLOW FE) 160 (50 FE) MG TBCR Take 160 mg by mouth 2 (two) times daily. Patient  has been taking Once Daily  . Glucosamine 500 MG TABS Take 1 tablet by mouth daily. Glucosamine-Chondronton  . levothyroxine (SYNTHROID, LEVOTHROID) 125 MCG tablet TAKE ONE TABLET BY MOUTH DAILY  . losartan (COZAAR) 100 MG tablet TAKE ONE TABLET BY MOUTH DAILY  . oxybutynin (DITROPAN-XL) 10 MG 24 hr tablet TAKE ONE TABLET BY MOUTH EVERY NIGHT AT BEDTIME  . [DISCONTINUED] budesonide (ENTOCORT EC) 3 MG 24 hr capsule Take 3 tabs daily x 8 weeks, if not better-then begin taking 2 tabs daily x 6 weeks, then 1 tab daily x 2 weeks;  . [DISCONTINUED] budesonide (ENTOCORT EC) 3 MG 24 hr capsule Take 1 capsule (3 mg total) by mouth daily.  . [DISCONTINUED] buPROPion (WELLBUTRIN) 75 MG tablet Take 1 tablet (75 mg total) by mouth 2 (two) times daily.  . [DISCONTINUED] cholestyramine (QUESTRAN) 4 g packet Take 1 packet (4 g total) by mouth 2 (two) times daily. Must take medication 2 hours before or after other medications; (Patient not taking: Reported on 10/30/2018)   No facility-administered encounter medications on file as of 10/30/2018.     Activities of Daily Living In your present state of health, do you have any difficulty performing the following activities: 10/30/2018 03/24/2018  Hearing? N N  Vision? N N  Difficulty concentrating or making decisions? N N  Walking or climbing stairs? N Y  Dressing or bathing? N N  Doing errands, shopping? Y N  Preparing Food and eating ? N -  Using the Toilet? N -  In the past six months, have you accidently leaked urine? N -  Do you have problems  with loss of bowel control? N -  Managing your Medications? N -  Managing your Finances? N -  Housekeeping or managing your Housekeeping? N -  Some recent data might be hidden    Patient Care Team: Saguier, Iris Pert as PCP - General (Internal Medicine) Lyndal Pulley, DO as Attending Physician (Sports Medicine)    Assessment:   This is a routine wellness examination for [place]. Physical assessment  deferred to PCP.  Exercise Activities and Dietary recommendations Current Exercise Habits: The patient does not participate in regular exercise at present, Exercise limited by: None identified Diet (meal preparation, eat out, water intake, caffeinated beverages, dairy products, fruits and vegetables): 24 hr recall Breakfast: 3 twinkies, milk Lunch: skips Dinner:  Chicken casserole and peas Pt reports she needs to drink more water.  Goals    . <enter goal here> (pt-stated)     Complete bible study course.       Fall Risk Fall Risk  10/30/2018 10/20/2017 09/21/2017 07/14/2017 06/03/2017  Falls in the past year? 0 No Yes No Yes  Comment - - - - -  Number falls in past yr: - - 1 - 1  Injury with Fall? - - No - No  Risk Factor Category  - - - - -  Risk for fall due to : - - Impaired mobility;Impaired balance/gait - Impaired mobility;Impaired balance/gait;History of fall(s)  Follow up - - - - -   Depression Screen PHQ 2/9 Scores 10/30/2018 10/20/2017 09/29/2016 06/10/2016  PHQ - 2 Score 0 1 1 0  PHQ- 9 Score - - 3 -     Cognitive Function    MMSE - Mini Mental State Exam 10/20/2017 06/10/2016  Orientation to time 5 5  Orientation to Place 5 5  Registration 3 3  Attention/ Calculation 5 5  Recall 2 2  Language- name 2 objects 2 2  Language- repeat 1 1  Language- follow 3 step command 3 3  Language- read & follow direction 1 1  Write a sentence 1 1  Copy design 1 1  Total score 29 29     6CIT Screen 10/30/2018  What Year? 0 points  What month? 0 points  What time? 0 points  Count back from 20 0 points  Months in reverse 0 points  Repeat phrase 0 points  Total Score 0    Immunization History  Administered Date(s) Administered  . PPD Test 10/03/2013, 05/14/2014, 05/17/2014  . Pneumococcal Polysaccharide-23 11/29/2007  . Td 03/16/1999, 11/25/2009    Screening Tests Health Maintenance  Topic Date Due  . PNA vac Low Risk Adult (2 of 2 - PCV13) 11/28/2008  . INFLUENZA  VACCINE  10/14/2018  . TETANUS/TDAP  11/26/2019  . DEXA SCAN  Completed      Plan:    Please schedule your next medicare wellness visit with me in 1 yr.  Continue to eat heart healthy diet (full of fruits, vegetables, whole grains, lean protein, water--limit salt, fat, and sugar intake) and increase physical activity as tolerated.  Continue doing brain stimulating activities (puzzles, reading, adult coloring books, staying active) to keep memory sharp.   Bring a copy of your living will and/or healthcare power of attorney to your next office visit.   I have personally reviewed and noted the following in the patient's chart:   . Medical and social history . Use of alcohol, tobacco or illicit drugs  . Current medications and supplements . Functional ability and status . Nutritional status .  Physical activity . Advanced directives . List of other physicians . Hospitalizations, surgeries, and ER visits in previous 12 months . Vitals . Screenings to include cognitive, depression, and falls . Referrals and appointments  In addition, I have reviewed and discussed with patient certain preventive protocols, quality metrics, and best practice recommendations. A written personalized care plan for preventive services as well as general preventive health recommendations were provided to patient.     Nalda, [place], [place]  10/30/2018   Reviewed and agree with evaluation & recommendations of RN.  Mackie Pai, PA-C

## 2018-10-30 ENCOUNTER — Other Ambulatory Visit: Payer: Self-pay

## 2018-10-30 ENCOUNTER — Encounter: Payer: Self-pay | Admitting: *Deleted

## 2018-10-30 ENCOUNTER — Ambulatory Visit (INDEPENDENT_AMBULATORY_CARE_PROVIDER_SITE_OTHER): Payer: Medicare Other | Admitting: *Deleted

## 2018-10-30 DIAGNOSIS — Z0000 Encounter for general adult medical examination without abnormal findings: Secondary | ICD-10-CM | POA: Diagnosis not present

## 2018-10-30 NOTE — Patient Instructions (Signed)
Please schedule your next medicare wellness visit with me in 1 yr.  Continue to eat heart healthy diet (full of fruits, vegetables, whole grains, lean protein, water--limit salt, fat, and sugar intake) and increase physical activity as tolerated.  Continue doing brain stimulating activities (puzzles, reading, adult coloring books, staying active) to keep memory sharp.   Bring a copy of your living will and/or healthcare power of attorney to your next office visit.   Ms. Rose [name] , Thank you for taking time to come for your Medicare Wellness Visit. I appreciate your ongoing commitment to your health goals. Please review the following plan we discussed and let me know if I can assist you in the future.   These are the goals we discussed: Goals    . <enter goal here> (pt-stated)     Complete bible study course.       This is a list of the screening recommended for you and due dates:  Health Maintenance  Topic Date Due  . Pneumonia vaccines (2 of 2 - PCV13) 11/28/2008  . Flu Shot  10/14/2018  . Tetanus Vaccine  11/26/2019  . DEXA scan (bone density measurement)  Completed    Health Maintenance After Age 49 After age 10, you are at a higher risk for certain long-term diseases and infections as well as injuries from falls. Falls are a major cause of broken bones and head injuries in people who are older than age 38. Getting regular preventive care can help to keep you healthy and well. Preventive care includes getting regular testing and making lifestyle changes as recommended by your health care provider. Talk with your health care provider about:  Which screenings and tests you should have. A screening is a test that checks for a disease when you have no symptoms.  A diet and exercise plan that is right for you. What should I know about screenings and tests to prevent falls? Screening and testing are the best ways to find a health problem early. Early diagnosis and treatment give you  the best chance of managing medical conditions that are common after age 69. Certain conditions and lifestyle choices may make you more likely to have a fall. Your health care provider may recommend:  Regular vision checks. Poor vision and conditions such as cataracts can make you more likely to have a fall. If you wear glasses, make sure to get your prescription updated if your vision changes.  Medicine review. Work with your health care provider to regularly review all of the medicines you are taking, including over-the-counter medicines. Ask your health care provider about any side effects that may make you more likely to have a fall. Tell your health care provider if any medicines that you take make you feel dizzy or sleepy.  Osteoporosis screening. Osteoporosis is a condition that causes the bones to get weaker. This can make the bones weak and cause them to break more easily.  Blood pressure screening. Blood pressure changes and medicines to control blood pressure can make you feel dizzy.  Strength and balance checks. Your health care provider may recommend certain tests to check your strength and balance while standing, walking, or changing positions.  Foot health exam. Foot pain and numbness, as well as not wearing proper footwear, can make you more likely to have a fall.  Depression screening. You may be more likely to have a fall if you have a fear of falling, feel emotionally low, or feel unable to do activities that  you used to do.  Alcohol use screening. Using too much alcohol can affect your balance and may make you more likely to have a fall. What actions can I take to lower my risk of falls? General instructions  Talk with your health care provider about your risks for falling. Tell your health care provider if: ? You fall. Be sure to tell your health care provider about all falls, even ones that seem minor. ? You feel dizzy, sleepy, or off-balance.  Take over-the-counter and  prescription medicines only as told by your health care provider. These include any supplements.  Eat a healthy diet and maintain a healthy weight. A healthy diet includes low-fat dairy products, low-fat (lean) meats, and fiber from whole grains, beans, and lots of fruits and vegetables. Home safety  Remove any tripping hazards, such as rugs, cords, and clutter.  Install safety equipment such as grab bars in bathrooms and safety rails on stairs.  Keep rooms and walkways well-lit. Activity   Follow a regular exercise program to stay fit. This will help you maintain your balance. Ask your health care provider what types of exercise are appropriate for you.  If you need a cane or walker, use it as recommended by your health care provider.  Wear supportive shoes that have nonskid soles. Lifestyle  Do not drink alcohol if your health care provider tells you not to drink.  If you drink alcohol, limit how much you have: ? 0-1 drink a day for women. ? 0-2 drinks a day for men.  Be aware of how much alcohol is in your drink. In the [place] one drink equals one typical bottle of beer (12 oz), one-half glass of wine (5 oz), or one shot of hard liquor (1 oz).  Do not use any products that contain nicotine or tobacco, such as cigarettes and e-cigarettes. If you need help quitting, ask your health care provider. Summary  Having a healthy lifestyle and getting preventive care can help to protect your health and wellness after age 47.  Screening and testing are the best way to find a health problem early and help you avoid having a fall. Early diagnosis and treatment give you the best chance for managing medical conditions that are more common for people who are older than age 34.  Falls are a major cause of broken bones and head injuries in people who are older than age 56. Take precautions to prevent a fall at home.  Work with your health care provider to learn what changes you can make to  improve your health and wellness and to prevent falls. This information is not intended to replace advice given to you by your health care provider. Make sure you discuss any questions you have with your health care provider. Document Released: 01/12/2017 Document Revised: 06/22/2018 Document Reviewed: 01/12/2017 Elsevier Patient Education  2020 [employer].

## 2018-11-08 ENCOUNTER — Ambulatory Visit: Payer: Medicare Other | Admitting: Gastroenterology

## 2018-11-13 ENCOUNTER — Other Ambulatory Visit: Payer: Self-pay | Admitting: Medical

## 2018-11-13 DIAGNOSIS — Z1231 Encounter for screening mammogram for malignant neoplasm of breast: Secondary | ICD-10-CM

## 2018-11-27 ENCOUNTER — Ambulatory Visit: Payer: Medicare Other | Admitting: Gastroenterology

## 2018-11-27 ENCOUNTER — Telehealth: Payer: Self-pay

## 2018-11-27 NOTE — Telephone Encounter (Signed)
Called patient and she said she would like to cancel appointment because she felt like she didn't need one and she said she will call to schedule appointment when she needs one

## 2018-11-27 NOTE — Telephone Encounter (Signed)
Called patient and left voicemail to offer virtual ov

## 2018-12-14 DIAGNOSIS — Z85828 Personal history of other malignant neoplasm of skin: Secondary | ICD-10-CM | POA: Diagnosis not present

## 2018-12-14 DIAGNOSIS — C44311 Basal cell carcinoma of skin of nose: Secondary | ICD-10-CM | POA: Diagnosis not present

## 2018-12-14 DIAGNOSIS — Z08 Encounter for follow-up examination after completed treatment for malignant neoplasm: Secondary | ICD-10-CM | POA: Diagnosis not present

## 2018-12-14 DIAGNOSIS — D692 Other nonthrombocytopenic purpura: Secondary | ICD-10-CM | POA: Diagnosis not present

## 2018-12-14 DIAGNOSIS — L821 Other seborrheic keratosis: Secondary | ICD-10-CM | POA: Diagnosis not present

## 2018-12-14 DIAGNOSIS — C4401 Basal cell carcinoma of skin of lip: Secondary | ICD-10-CM | POA: Diagnosis not present

## 2018-12-14 DIAGNOSIS — C441121 Basal cell carcinoma of skin of right upper eyelid, including canthus: Secondary | ICD-10-CM | POA: Diagnosis not present

## 2018-12-21 ENCOUNTER — Encounter: Payer: Self-pay | Admitting: Gynecology

## 2018-12-27 ENCOUNTER — Ambulatory Visit
Admission: RE | Admit: 2018-12-27 | Discharge: 2018-12-27 | Disposition: A | Discharge status: Home / Self Care | Payer: Medicare Other | Source: Ambulatory Visit | Attending: Medical | Admitting: Medical

## 2018-12-27 ENCOUNTER — Other Ambulatory Visit: Payer: Self-pay

## 2018-12-27 DIAGNOSIS — Z1231 Encounter for screening mammogram for malignant neoplasm of breast: Secondary | ICD-10-CM | POA: Diagnosis not present

## 2018-12-29 ENCOUNTER — Other Ambulatory Visit: Payer: Self-pay | Admitting: Medical

## 2018-12-29 DIAGNOSIS — R928 Other abnormal and inconclusive findings on diagnostic imaging of breast: Secondary | ICD-10-CM

## 2019-01-02 ENCOUNTER — Other Ambulatory Visit: Payer: Self-pay | Admitting: Medical

## 2019-01-02 ENCOUNTER — Other Ambulatory Visit: Payer: Self-pay

## 2019-01-02 ENCOUNTER — Ambulatory Visit
Admission: RE | Admit: 2019-01-02 | Discharge: 2019-01-02 | Disposition: A | Discharge status: Home / Self Care | Payer: Medicare Other | Source: Ambulatory Visit | Attending: Medical | Admitting: Medical

## 2019-01-02 DIAGNOSIS — N6320 Unspecified lump in the left breast, unspecified quadrant: Secondary | ICD-10-CM

## 2019-01-02 DIAGNOSIS — R928 Other abnormal and inconclusive findings on diagnostic imaging of breast: Secondary | ICD-10-CM | POA: Diagnosis not present

## 2019-01-02 DIAGNOSIS — N6322 Unspecified lump in the left breast, upper inner quadrant: Secondary | ICD-10-CM | POA: Diagnosis not present

## 2019-01-04 ENCOUNTER — Ambulatory Visit
Admission: RE | Admit: 2019-01-04 | Discharge: 2019-01-04 | Disposition: A | Discharge status: Home / Self Care | Payer: Medicare Other | Source: Ambulatory Visit | Attending: Medical | Admitting: Medical

## 2019-01-04 ENCOUNTER — Other Ambulatory Visit: Payer: Self-pay

## 2019-01-04 DIAGNOSIS — N6322 Unspecified lump in the left breast, upper inner quadrant: Secondary | ICD-10-CM | POA: Diagnosis not present

## 2019-01-04 DIAGNOSIS — N6320 Unspecified lump in the left breast, unspecified quadrant: Secondary | ICD-10-CM

## 2019-01-04 DIAGNOSIS — C50212 Malignant neoplasm of upper-inner quadrant of left female breast: Secondary | ICD-10-CM | POA: Diagnosis not present

## 2019-01-09 ENCOUNTER — Ambulatory Visit: Payer: Self-pay | Admitting: Surgery

## 2019-01-09 ENCOUNTER — Other Ambulatory Visit: Payer: Self-pay | Admitting: Surgery

## 2019-01-09 ENCOUNTER — Other Ambulatory Visit: Payer: Self-pay | Admitting: Medical

## 2019-01-09 DIAGNOSIS — C50912 Malignant neoplasm of unspecified site of left female breast: Secondary | ICD-10-CM

## 2019-01-09 NOTE — H&P (Signed)
Maximiano Coss Documented: 01/09/2019 9:27 AM Location: [facility] Patient #: [id number] DOB: 1941/01/20 Single / Language: [place] / Race: White Female  History of Present Illness Rose Fus A. Kinney Sackmann MD; 01/09/2019 12:38 PM) Patient words: Patient sent at the request of the breast center of [facility] secondary due to mammographic abnormality noted on recent screening mammogram of her left breast. Masslike distortion was noted left breast upper inner quadrant. There are 2 foci of disease one being 1 cm and the other being 0.5 cm. Both were biopsied and found to be consistent with lobular carcinoma of the left breast upper inner quadrant as she receptor positive, progesterone receptor positive, HER-2/neu negative with a K-67 of 15%. She has no family history of breast cancer. She gives no history of breast mass, nipple discharge or change in the appearance of either breast. She has no breast pain.              ADDENDUM REPORT: 01/05/2019 10:54 ADDENDUM: Pathology revealed GRADE II INVASIVE MAMMARY CARCINOMA, MAMMARY CARCINOMA IN SITU of the Left breast, upper inner quadrant, 11 o'clock. This was found to be concordant by Dr. Quincy Carnes. Pathology revealed FOCAL INVASIVE MAMMARY CARCINOMA, LOBULAR NEOPLASIA (ATYPICAL LOBULAR HYPERPLASIA) of the Left breast, upper inner quadrant, 11:30 o'clock. This was found to be concordant by Dr. Quincy Carnes. Pathology results were discussed with the patient by telephone. The patient reported doing well after the biopsies with tenderness at the sites. Post biopsy instructions and care were reviewed and questions were answered. The patient was encouraged to call [facility] for any additional concerns. Surgical consultation has been arranged with Dr. Harriette Bouillon at [facility] on January 09, 2019. Pathology results reported by Rene Kocher, RN on 01/05/2019. Electronically Signed  By: Hulan Saas M.D. On: 01/05/2019 10:54 Addended by Hulan Saas, MD on 01/05/2019 1:02 PM  Study Result CLINICAL DATA: 78 year old with a screening detected approximate 1.0 cm mass associated with architectural distortion involving the UPPER INNER QUADRANT of the LEFT breast at the 11 o'clock position approximately 4 cm from. An adjacent satellite approximate 0.6 cm mass was identified on diagnostic workup at the 11:30 o'clock position approximately 4 cm from nipple. The adjacent masses are separated by 1 cm, and in total, the masses span 2.5 cm. EXAM: ULTRASOUND GUIDED LEFT BREAST CORE NEEDLE BIOPSY x 2 COMPARISON: Previous exam(s). FINDINGS: I met with the patient and we discussed the procedure of ultrasound-guided biopsy, including benefits and alternatives. We discussed the high likelihood of a successful procedure. We discussed the risks of the procedure, including infection, bleeding, tissue injury, clip migration, and inadequate sampling. Informed written consent was given. The usual time-out protocol was performed immediately prior to the procedure. # 1) Dominant 1.0 cm mass, lesion quadrant: UPPER INNER QUADRANT. Using sterile technique with chlorhexidine as skin antisepsis, 1% lidocaine and 1% lidocaine with epinephrine as local anesthetic, under direct ultrasound visualization, a 12 gauge Bard Marquee core needle device placed through an 11 gauge introducer needle was used to initially perform biopsy of the dominant 1 cm mass in the UPPER INNER QUADRANT of the LEFT breast using an inferomedial approach. At the conclusion of the procedure a ribbon shaped tissue marker clip was deployed into the biopsy cavity. # 2) Satellite 0.6 cm mass, lesion quadrant: UPPER INNER QUADRANT. Using the same dermatotomy site and prior local anesthetic placement, a 12 gauge Bard Marquee core needle device placed through an 11 gauge introducer needle was used to perform  biopsy of the  satellite 0.6 cm mass in the UPPER INNER QUADRANT the LEFT breast using an inferomedial approach. At the conclusion of the procedure a coil shaped tissue marker clip was deployed into the biopsy cavity. Follow up 2 view mammogram was performed and dictated separately. IMPRESSION: Ultrasound guided biopsy of 2 adjacent masses in the UPPER INNER QUADRANT of the LEFT breast. No apparent complications. Electronically Signed: By: Hulan Saas M.D. On: 01/04/2019 11:53   CLINICAL DATA: 78 year old female for further evaluation of possible LEFT breast mass/distortion on screening mammogram.  EXAM: DIGITAL DIAGNOSTIC LEFT MAMMOGRAM WITH TOMO  ULTRASOUND LEFT BREAST  COMPARISON: Previous exam(s).  ACR Breast Density Category b: There are scattered areas of fibroglandular density.  FINDINGS: 2D/3D spot compression views of the LEFT breast demonstrate a persistent irregular mass within the UPPER INNER LEFT breast.  Targeted ultrasound is performed, showing a 1 x 0.8 x 0.7 cm irregular hypoechoic mass at the 11 o'clock position of the LEFT breast 4 cm from the nipple and an adjacent 0.5 x 0.3 x 0.6 cm mass at the 11:30 position 4 cm from the nipple. These 2 masses are separated by a distance of 1 cm with equivocal hypoechoic material between these masses. These 2 masses encompass an area measuring 2.5 cm.  No abnormal LEFT axillary lymph nodes are noted.  IMPRESSION: 1. Suspicious adjacent 1 cm and 0.6 cm masses within UPPER INNER LEFT breast separated by a distance of 1 cm, with both masses encompassing an area measuring 2.5 cm. Tissue sampling of both of these masses recommended. 2. No abnormal LEFT axillary lymph nodes.  RECOMMENDATION: Ultrasound-guided biopsies of both UPPER INNER LEFT breast masses, which will be scheduled.  I have discussed the findings and recommendations with the patient. If applicable, a reminder letter will be sent to the patient regarding the  next appointment.  BI-RADS CATEGORY 4: Suspicious.              ADDITIONAL INFORMATION: E-cadherin is negative consistent with a lobular phenotype. Valinda Hoar MD Pathologist, Electronic Signature ( Signed 01/08/2019) 1. PROGNOSTIC INDICATORS Results: IMMUNOHISTOCHEMICAL AND MORPHOMETRIC ANALYSIS PERFORMED MANUALLY The tumor cells are NEGATIVE for Her2 (0). Estrogen Receptor: 100%, POSITIVE, STRONG STAINING INTENSITY Progesterone Receptor: 90%, POSITIVE, STRONG STAINING INTENSITY Proliferation Marker Ki67: 15% REFERENCE RANGE ESTROGEN RECEPTOR NEGATIVE 0% POSITIVE =>1% REFERENCE RANGE PROGESTERONE RECEPTOR NEGATIVE 0% POSITIVE =>1% All controls stained appropriately Pecola Leisure MD Pathologist, Electronic Signature ( Signed 01/08/2019) FINAL DIAGNOSIS 1 of 3 FINAL for Un, Shynia ESTES ([id number]) Diagnosis 1. Breast, left, needle core biopsy, upper inner quadrant, 11 o'clock - INVASIVE MAMMARY CARCINOMA, SEE COMMENT. - MAMMARY CARCINOMA IN SITU. 2. Breast, left, needle core biopsy, LUIQ 11:30 o'clock - FOCAL INVASIVE MAMMARY CARCINOMA. - LOBULAR NEOPLASIA (ATYPICAL LOBULAR HYPERPLASIA). Microscopic Comment 1. The carcinoma appears grade 2 and measures 6 mm in greatest linear extent. The small focus in part 2 has similar morphology. E-cadherin will be ordered. Prognostic makers will be ordered. Dr. Colonel Bald has reviewed the case. The case was called to [facility] on 01/05/2019. Valinda Hoar MD Pathologist, Electronic Signature (Case signed 01/05/2019) Specimen.  The patient is a 78 year old female.   Past Surgical History Marcelino Duster R. Brooks, CMA; 01/09/2019 9:28 AM) Appendectomy Colon Polyp Removal - Colonoscopy Hip Surgery Left. Oral Surgery Spinal Surgery - Lower Back Spinal Surgery - Neck Spinal Surgery Midback  Diagnostic Studies History Marcelino Duster R. Brooks, CMA; 01/09/2019 9:28 AM) Colonoscopy within last  year Mammogram within last year Pap  Smear 1-5 years ago  Allergies Marcelino Duster R. Brooks, CMA; 01/09/2019 9:28 AM) Morphine Sulfate *ANALGESICS - OPIOID* Amoxicillin *PENICILLINS*  Medication History (Michelle R. Brooks, CMA; 01/09/2019 9:30 AM) Alendronate Sodium (70MG  Tablet, Oral) Active. amLODIPine Besylate (5MG  Tablet, Oral) Active. Atomoxetine HCl (100MG  Capsule, Oral) Active. cloNIDine HCl (0.1MG  Tablet, Oral) Active. DULoxetine HCl (60MG  Capsule DR Part, Oral) Active. Levothyroxine Sodium ( Tablet, Oral) Active. Losartan Potassium (100MG  Tablet, Oral) Active. Oxybutynin Chloride ER (10MG  Tablet ER 24HR, Oral) Active. Aspirin (81MG  Tablet, Oral) Active. Glucosamine Chondr 500 Complex (Oral) Active. Slow Iron (160 (50 Fe)MG Tablet ER, Oral) Active. Medications Reconciled  Social History Marcelino Duster R. Brooks, CMA; 01/09/2019 9:28 AM) No alcohol use No caffeine use No drug use Tobacco use Former smoker.  Pregnancy / Birth History Marcelino Duster R. Brooks, CMA; 01/09/2019 9:28 AM) Age at menarche 12 years. Age of menopause 69-50 Gravida 6 Length (months) of breastfeeding 7-12 Maternal age 7-25 Para 5  Other Problems Marcelino Duster R. Brooks, CMA; 01/09/2019 9:28 AM) Arthritis Breast Cancer Hemorrhoids High blood pressure Melanoma Pancreatitis Thyroid Disease     Review of Systems [facility] R. Brooks CMA; 01/09/2019 9:28 AM) Gastrointestinal Present- Hemorrhoids. Not Present- Abdominal Pain, Bloating, Bloody Stool, Change in Bowel Habits, Chronic diarrhea, Constipation, Difficulty Swallowing, Excessive gas, Gets full quickly at meals, Indigestion, Nausea, Rectal Pain and Vomiting. Musculoskeletal Present- Back Pain and Joint Pain. Not Present- Joint Stiffness, Muscle Pain, Muscle Weakness and Swelling of Extremities. Neurological Present- Trouble walking. Not Present- Decreased Memory, Fainting, Headaches, Numbness, Seizures, Tingling,  Tremor and Weakness.  Vitals [employer] R. Brooks CMA; 01/09/2019 9:28 AM) 01/09/2019 9:27 AM Weight: 123.25 lb Height: 63in Body Surface Area: 1.57 m Body Mass Index: 21.83 kg/m  Pulse: 102 (Regular)  BP: 126/70 (Sitting, Left Arm, Standard)        Physical Exam (Riely Baskett A. Keilah Lemire MD; 01/09/2019 12:40 PM)  General Mental Status-Alert. General Appearance-Consistent with stated age. Hydration-Well hydrated. Voice-Normal.  Head and Neck Head-normocephalic, atraumatic with no lesions or palpable masses.  Breast Note: Left breast shows resulting upper quadrant without mass lesion. Left axilla normal. Small amount of crusting of left nipple which appears to be physiologic. Right breast normal.  Cardiovascular Note: Warm well perfused. Regular rate and rhythm  Neurologic Neurologic evaluation reveals -alert and oriented x 3 with no impairment of recent or remote memory. Mental Status-Normal.  Musculoskeletal Normal Exam - Left-Upper Extremity Strength Normal and Lower Extremity Strength Normal. Normal Exam - Right-Upper Extremity Strength Normal, Lower Extremity Weakness.  Lymphatic Note: No evidence of axillary adenopathy. His bilaterally. No cervical adenopathy. No supraclavicular adenopathy.    Assessment & Plan (Wilber Fini A. Tyrik Stetzer MD; 01/09/2019 12:41 PM)  BREAST CANCER, LEFT (C50.912) Impression: Discussed breast conservation versus mastectomy with reconstruction. Discussed sentinel lymph node mapping and the significance of this with respect to her breast cancer. The patient has opted for breast conservation. We'll schedule for left breast seed localized lumpectomy with 2 seeds to bracket the area. Discussed sentinel lymph node mapping the pros and cons of doing it at her age and significance of complication and or omission of it. Refer to medical and radiation oncology. Scheduled for left breast seed localized lumpectomy with left  axillary sentinel lymph node mapping this patient and daughter wish to proceed with lymph node biopsy after discussion of potential complications and or usefulness given her disease and age  Current Plans Referred to Oncology, for evaluation and follow up (Oncology). Routine. Referred to Radiation Oncology, for evaluation and follow up (Radiation Oncology). Routine.  Pt Education - [facility] Breast Cancer Information Given - [facility] "Breast Journey" Package You are being scheduled for surgery- Our schedulers will call you.  You should hear from our office's scheduling department within 5 working days about the location, date, and time of surgery. We try to make accommodations for patient's preferences in scheduling surgery, but sometimes the OR schedule or the surgeon's schedule prevents [place] from making those accommodations.  If you have not heard from our office [phone]) in 5 working days, call the office and ask for your surgeon's nurse.  If you have other questions about your diagnosis, plan, or surgery, call the office and ask for your surgeon's nurse.  We discussed the staging and pathophysiology of breast cancer. We discussed all of the different options for treatment for breast cancer including surgery, chemotherapy, radiation therapy, Herceptin, and antiestrogen therapy. We discussed a sentinel lymph node biopsy as she does not appear to having lymph node involvement right now. We discussed the performance of that with injection of radioactive tracer and blue dye. We discussed that she would have an incision underneath her axillary hairline. We discussed that there is a bout a 10-20% chance of having a positive node with a sentinel lymph node biopsy and we will await the permanent pathology to make any other first further decisions in terms of her treatment. One of these options might be to return to the operating room to perform an axillary lymph node dissection. We discussed about a 1-2%  risk lifetime of chronic shoulder pain as well as lymphedema associated with a sentinel lymph node biopsy. We discussed the options for treatment of the breast cancer which included lumpectomy versus a mastectomy. We discussed the performance of the lumpectomy with a wire placement. We discussed a 10-20% chance of a positive margin requiring reexcision in the operating room. We also discussed that she may need radiation therapy or antiestrogen therapy or both if she undergoes lumpectomy. We discussed the mastectomy and the postoperative care for that as well. We discussed that there is no difference in her survival whether she undergoes lumpectomy with radiation therapy or antiestrogen therapy versus a mastectomy. There is a slight difference in the local recurrence rate being 3-5% with lumpectomy and about 1% with a mastectomy. We discussed the risks of operation including bleeding, infection, possible reoperation. She understands her further therapy will be based on what her stages at the time of her operation.  Pt Education - [facility] breast cancer surgery: discussed with patient and provided information. Pt Education - [facility] Breast Biopsy [facility]: discussed with patient and provided information.

## 2019-01-10 ENCOUNTER — Telehealth: Payer: Self-pay | Admitting: Radiation Oncology

## 2019-01-10 ENCOUNTER — Encounter: Payer: Self-pay | Admitting: Adult Health

## 2019-01-10 DIAGNOSIS — C50212 Malignant neoplasm of upper-inner quadrant of left female breast: Secondary | ICD-10-CM | POA: Insufficient documentation

## 2019-01-10 DIAGNOSIS — Z170 Estrogen receptor positive status [ER+]: Secondary | ICD-10-CM | POA: Insufficient documentation

## 2019-01-10 NOTE — Telephone Encounter (Signed)
New Message: ° ° °LVM for patient to return call to schedule appt from referral received. °

## 2019-01-12 ENCOUNTER — Other Ambulatory Visit: Payer: Self-pay | Admitting: Surgery

## 2019-01-12 ENCOUNTER — Telehealth: Payer: Self-pay | Admitting: Hematology and Oncology

## 2019-01-12 DIAGNOSIS — C50912 Malignant neoplasm of unspecified site of left female breast: Secondary | ICD-10-CM

## 2019-01-12 NOTE — Telephone Encounter (Signed)
Confirmed with patient 11/3 new patient appointment with Dr. Lindi Adie.

## 2019-01-15 NOTE — Progress Notes (Signed)
[place] NOTE  Patient Care Team: Saguier, Iris Pert as PCP - General (Internal Medicine) Lyndal Pulley, DO as Attending Physician (Sports Medicine)  CHIEF COMPLAINTS/PURPOSE OF CONSULTATION:  Newly diagnosed breast cancer  HISTORY OF PRESENTING ILLNESS:  Rose [name] 78 y.o. female is here because of recent diagnosis of invasive mammary carcinoma of the left breast. The cancer was detected on a routine screening mammogram on 12/27/18. Diagnostic mammogram and [place] on 01/02/19 showed adjacent left breast masses measuring 1.0cm and 0.6, spanning 2.5cm in total, with no abnormal left axillary lymph nodes. Biopsy on 01/04/19 showed invasive and in situ mammary carcinoma, grade 2, HER-2 negative (0), ER+ 100%, PR+ 90%, Ki67 15%. She presents to the clinic today for initial evaluation and discussion of treatment options.   I reviewed her records extensively and collaborated the history with the patient.  SUMMARY OF ONCOLOGIC HISTORY: Oncology History  Malignant neoplasm of upper-inner quadrant of left breast in female, estrogen receptor positive ([place])  12/27/2018 Initial Diagnosis   Routine screening mammogram detected adjacent left breast masses measuring 1.0cm and 0.6, spanning 2.5cm in total, with no abnormal left axillary lymph nodes. Biopsy showed invasive and in situ mammary carcinoma, grade 2, HER-2 - (0), ER+ 100%, PR+ 90%, Ki67 15%.    01/04/2019 Cancer Staging   Staging form: Breast, AJCC 8th Edition - Clinical stage from 01/04/2019: Stage IA (cT1b, cN0, cM0, G2, ER+, PR+, HER2-) - Signed by Gardenia Phlegm, NP on 01/10/2019      MEDICAL HISTORY:  Past Medical History:  Diagnosis Date  . Abnormal blood pressure    left arm from old brachial artery repair  . Allergy   . Anemia   . Anxiety   . Atherosclerosis of renal artery ([place])   . Attention deficit disorder without mention of hyperactivity   . Basal cell carcinoma of face   .  Benign heart murmur   . Blood transfusion without reported diagnosis   . Carotid artery disease ([place])   . Carotid bruit    Left  . Chronic bronchitis ([place])   . Depression   . DJD (degenerative joint disease) of hip   . Esophageal reflux   . History of depression   . History of spinal fusion   . Hypertension   . Morton's neuroma    Hx of, Left, 2 on right   . Myalgia and myositis, unspecified   . Other tenosynovitis of hand and wrist   . Pancreatitis    03-2018  . Peripheral neuropathy   . Personal history of alcoholism ([place])   . Personal history of peptic ulcer disease   . Pulmonary nodule   . Rotator cuff disorder    pain  . Solitary cyst of breast   . Subclavian steal syndrome   . Substance abuse ([place])   . Unspecified hypothyroidism   . Urinary incontinence   . Wears glasses     SURGICAL HISTORY: Past Surgical History:  Procedure Laterality Date  . APPENDECTOMY    . CARPOMETACARPEL SUSPENSION PLASTY Right 08/07/2013   Procedure: CARPOMETACARPEL [facility]) SUSPENSION PLASTY RIGHT THUMB;  Surgeon: Wynonia Sours, MD;  Location: [place];  Service: Orthopedics;  Laterality: Right;  . CARPOMETACARPEL SUSPENSION PLASTY Left 10/23/2015   Procedure: SUSPENSION PLASTY LEFT THUMB TRAPEZIUM EXCISION ABDUCTOR POLLICIS LONGUS TRANSFER;  Surgeon: Daryll Brod, MD;  Location: [place];  Service: Orthopedics;  Laterality: Left;  . CERVICAL FUSION  2007   Dr. Valli Glance  approach  . COLONOSCOPY    . JOINT REPLACEMENT Left   . Left brachial artery repair of pseudoaneurysm  2001   post a cath  . LUMBAR FUSION  09/2004   T12-L5 (Dr. Patrice Paradise)  . MINOR IRRIGATION AND DEBRIDEMENT OF WOUND Right 08/27/2014   Procedure: MINOR IRRIGATION AND DEBRIDEMENT OF WOUND;  Surgeon: Daryll Brod, MD;  Location: [place];  Service: Orthopedics;  Laterality: Right;  . ORIF TIBIA FRACTURE  2010   Left distal  . Percutanous Transluminal Angioplasty of Renal Arteris     . REPAIR EXTENSOR TENDON Right 09/26/2014   Procedure: REPAIR EXTENSOR TENDON RIGHT RING FINGER ;  Surgeon: Daryll Brod, MD;  Location: [place];  Service: Orthopedics;  Laterality: Right;  . SHOULDER ARTHROSCOPY  09/2008   Left  . TONSILLECTOMY AND ADENOIDECTOMY    . TOTAL HIP ARTHROPLASTY  2008   left  . WISDOM TOOTH EXTRACTION      SOCIAL HISTORY: Social History   Socioeconomic History  . Marital status: Single    Spouse name: Not on file  . Number of children: Y  . Years of education: Not on file  . Highest education level: Not on file  Occupational History  . Occupation: retired    Fish farm manager: RETIRED    CommentClinical cytogeneticist, had own firm  Social Needs  . Financial resource strain: Not on file  . Food insecurity    Worry: Not on file    Inability: Not on file  . Transportation needs    Medical: Not on file    Non-medical: Not on file  Tobacco Use  . Smoking status: Former Smoker    Packs/day: 1.00    Years: 25.00    Pack years: 25.00    Types: Cigarettes    Quit date: 03/16/1983    Years since quitting: 35.8  . Smokeless tobacco: Never Used  Substance and Sexual Activity  . Alcohol use: Not Currently    Comment: 1-2 glasses wine daily  . Drug use: No  . Sexual activity: Never    Comment: 1st intercourse 78 yo-Fewer than 5 partners  Lifestyle  . Physical activity    Days per week: Not on file    Minutes per session: Not on file  . Stress: Not on file  Relationships  . Social Herbalist on phone: Not on file    Gets together: Not on file    Attends religious service: Not on file    Active member of club or organization: Not on file    Attends meetings of clubs or organizations: Not on file    Relationship status: Not on file  . Intimate partner violence    Fear of current or ex partner: Not on file    Emotionally abused: Not on file    Physically abused: Not on file    Forced sexual activity: Not on file  Other Topics Concern  . Not  on file  Social History Narrative   [employer]-accounting      Married '60-45yrdivorced; '84-2 years, divorced      3 daughters- '61, '70, '71; 2 sons-'53,'55 (schizophrenic-died); 10 grandchildren      Lives alone with 1 dog and 3 cats      Pt unsure of family history- was adopted.                   FAMILY HISTORY: Family History  Adopted: Yes  Problem Relation Age of  Onset  . Schizophrenia Son        Deceased 78  . Hypertension Daughter        Renal  . Healthy Son        x1  . Healthy Daughter        x2    ALLERGIES:  is allergic to amoxicillin; codeine; hydrocodone-acetaminophen; hydromorphone hcl; morphine and related; morphine sulfate; and nsaids.  MEDICATIONS:  Current Outpatient Medications  Medication Sig Dispense Refill  . alendronate (FOSAMAX) 70 MG tablet TAKE 1 TABLET BY MOUTH ONCE WEEKLY BEFORE BREAKFAST, ON AN EMPTY STOMACH: REMAIN UPRIGHT FOR 30 MINUTES:TAKE WITH 8 OUNCES OF WATER (Patient taking differently: Take 70 mg by mouth once a week. TAKE 1 TABLET BY MOUTH ONCE WEEKLY BEFORE BREAKFAST, ON AN EMPTY STOMACH: REMAIN UPRIGHT FOR 30 MINUTES:TAKE WITH 8 OUNCES OF WATER  Each Sunday) 4 tablet 12  . amLODipine (NORVASC) 5 MG tablet 1-2 tab po daily 60 tablet 0  . aspirin 81 MG tablet Take 81 mg by mouth daily.     Marland Kitchen atomoxetine (STRATTERA) 100 MG capsule Take 100 mg by mouth daily.    . cloNIDine (CATAPRES) 0.1 MG tablet TAKE ONE TABLET BY MOUTH THREE TIMES A DAY 90 tablet 2  . DULoxetine (CYMBALTA) 60 MG capsule Take 1 capsule (60 mg total) by mouth daily. 90 capsule 3  . ferrous sulfate dried (SLOW FE) 160 (50 FE) MG TBCR Take 160 mg by mouth 2 (two) times daily. Patient has been taking Once Daily    . Glucosamine 500 MG TABS Take 1 tablet by mouth daily. Glucosamine-Chondronton    . levothyroxine (SYNTHROID, LEVOTHROID) 125 MCG tablet TAKE ONE TABLET BY MOUTH DAILY 90 tablet 2  . losartan (COZAAR) 100 MG tablet TAKE ONE TABLET BY MOUTH  DAILY 90 tablet 0  . oxybutynin (DITROPAN-XL) 10 MG 24 hr tablet TAKE ONE TABLET BY MOUTH EVERY NIGHT AT BEDTIME 30 tablet 11   No current facility-administered medications for this visit.     REVIEW OF SYSTEMS:   Constitutional: Denies fevers, chills or abnormal night sweats Eyes: Denies blurriness of vision, double vision or watery eyes Ears, nose, mouth, throat, and face: Denies mucositis or sore throat Respiratory: Denies cough, dyspnea or wheezes Cardiovascular: Denies palpitation, chest discomfort or lower extremity swelling Gastrointestinal:  Denies nausea, heartburn or change in bowel habits Skin: Denies abnormal skin rashes Lymphatics: Denies new lymphadenopathy or easy bruising Neurological:Denies numbness, tingling or new weaknesses Behavioral/Psych: Mood is stable, no new changes  Breast: Denies any palpable lumps or discharge All other systems were reviewed with the patient and are negative.  PHYSICAL EXAMINATION: ECOG PERFORMANCE STATUS: 1 - Symptomatic but completely ambulatory  Vitals:   01/16/19 1411  BP: (!) 152/62  Pulse: (!) 109  Resp: 18  Temp: 99.1 F (37.3 C)  SpO2: 96%   Filed Weights   01/16/19 1411  Weight: 121 lb (54.9 kg)    GENERAL:alert, no distress and comfortable SKIN: skin color, texture, turgor are normal, no rashes or significant lesions EYES: normal, conjunctiva are pink and non-injected, sclera clear OROPHARYNX:no exudate, no erythema and lips, buccal mucosa, and tongue normal  NECK: supple, thyroid normal size, non-tender, without nodularity LYMPH:  no palpable lymphadenopathy in the cervical, axillary or inguinal LUNGS: clear to auscultation and percussion with normal breathing effort HEART: regular rate & rhythm and no murmurs and no lower extremity edema ABDOMEN:abdomen soft, non-tender and normal bowel sounds Musculoskeletal:no cyanosis of digits and no clubbing  PSYCH: alert &  oriented x 3 with fluent speech NEURO: no focal  motor/sensory deficits BREAST: No palpable nodules in breast. No palpable axillary or supraclavicular lymphadenopathy (exam performed in the presence of a chaperone)   LABORATORY DATA:  I have reviewed the data as listed Lab Results  Component Value Date   WBC 6.1 05/30/2018   HGB 11.6 (L) 05/30/2018   HCT 34.8 (L) 05/30/2018   MCV 98.5 05/30/2018   PLT 253.0 05/30/2018   Lab Results  Component Value Date   NA 144 10/13/2018   K 4.6 10/13/2018   CL 109 10/13/2018   CO2 28 10/13/2018    RADIOGRAPHIC STUDIES: I have personally reviewed the radiological reports and agreed with the findings in the report.  ASSESSMENT AND PLAN:  Malignant neoplasm of upper-inner quadrant of left breast in female, estrogen receptor positive ([place]) 12/27/18: Routine screening mammogram detected adjacent left breast masses measuring 1.0cm and 0.6, spanning 2.5cm in total, with no abnormal left axillary lymph nodes. Biopsy showed invasive and in situ mammary carcinoma, grade 2, HER-2 - (0), ER+ 100%, PR+ 90%, Ki67 15%.  T1b N0 M0 stage Ia clinical stage  Pathology and radiology counseling:Discussed with the patient, the details of pathology including the type of breast cancer,the clinical staging, the significance of ER, PR and HER-2/neu receptors and the implications for treatment. After reviewing the pathology in detail, we proceeded to discuss the different treatment options between surgery, radiation, chemotherapy, antiestrogen therapies.  Recommendations: 1. Breast conserving surgery followed by 2.  Plus or minus adjuvant radiation therapy followed by 3. Adjuvant antiestrogen therapy  Return to clinic after surgery to discuss final pathology report and then determine the adjuvant treatment plan Since the patient will be staying with her daughter in order as well, I will do a telephone follow-up visit 1 week after surgery on 02/21/2019 All questions were answered.    Rulon Eisenmenger, MD, MPH  01/16/2019   I, Molly Dorshimer, am acting as scribe for Nicholas Lose, MD.  I have reviewed the above documentation for accuracy and completeness, and I agree with the above.

## 2019-01-16 ENCOUNTER — Other Ambulatory Visit: Payer: Self-pay

## 2019-01-16 ENCOUNTER — Encounter: Payer: Self-pay | Admitting: *Deleted

## 2019-01-16 ENCOUNTER — Inpatient Hospital Stay: Payer: Medicare Other | Attending: Hematology and Oncology | Admitting: Hematology and Oncology

## 2019-01-16 DIAGNOSIS — Z87891 Personal history of nicotine dependence: Secondary | ICD-10-CM | POA: Diagnosis not present

## 2019-01-16 DIAGNOSIS — C50212 Malignant neoplasm of upper-inner quadrant of left female breast: Secondary | ICD-10-CM | POA: Diagnosis not present

## 2019-01-16 DIAGNOSIS — Z170 Estrogen receptor positive status [ER+]: Secondary | ICD-10-CM | POA: Diagnosis not present

## 2019-01-16 NOTE — Assessment & Plan Note (Signed)
12/27/18: Routine screening mammogram detected adjacent left breast masses measuring 1.0cm and 0.6, spanning 2.5cm in total, with no abnormal left axillary lymph nodes. Biopsy showed invasive and in situ mammary carcinoma, grade 2, HER-2 - (0), ER+ 100%, PR+ 90%, Ki67 15%.  T1b N0 M0 stage Ia clinical stage  Pathology and radiology counseling:Discussed with the patient, the details of pathology including the type of breast cancer,the clinical staging, the significance of ER, PR and HER-2/neu receptors and the implications for treatment. After reviewing the pathology in detail, we proceeded to discuss the different treatment options between surgery, radiation, chemotherapy, antiestrogen therapies.  Recommendations: 1. Breast conserving surgery followed by 2.  Plus or minus adjuvant radiation therapy followed by 3. Adjuvant antiestrogen therapy  Return to clinic after surgery to discuss final pathology report and then determine the adjuvant treatment plan

## 2019-01-17 ENCOUNTER — Telehealth: Payer: Self-pay | Admitting: Hematology and Oncology

## 2019-01-17 DIAGNOSIS — M5416 Radiculopathy, lumbar region: Secondary | ICD-10-CM | POA: Diagnosis not present

## 2019-01-17 DIAGNOSIS — M47896 Other spondylosis, lumbar region: Secondary | ICD-10-CM | POA: Diagnosis not present

## 2019-01-17 DIAGNOSIS — M545 Low back pain: Secondary | ICD-10-CM | POA: Diagnosis not present

## 2019-01-17 NOTE — Telephone Encounter (Signed)
Scheduled appt 11/3 sch message- pt aware of appt date and time

## 2019-01-25 ENCOUNTER — Other Ambulatory Visit: Payer: Self-pay

## 2019-01-25 ENCOUNTER — Ambulatory Visit
Admission: RE | Admit: 2019-01-25 | Discharge: 2019-01-25 | Disposition: A | Discharge status: Home / Self Care | Payer: Medicare Other | Source: Ambulatory Visit | Attending: Surgery | Admitting: Surgery

## 2019-01-25 DIAGNOSIS — N6489 Other specified disorders of breast: Secondary | ICD-10-CM | POA: Diagnosis not present

## 2019-01-25 DIAGNOSIS — C50912 Malignant neoplasm of unspecified site of left female breast: Secondary | ICD-10-CM

## 2019-01-25 MED ORDER — GADOBUTROL 1 MMOL/ML IV SOLN
5.0000 mL | Freq: Once | INTRAVENOUS | Status: AC | PRN
  Administered 2019-01-25: 5 mL via INTRAVENOUS

## 2019-01-29 ENCOUNTER — Telehealth: Payer: Self-pay | Admitting: Gastroenterology

## 2019-01-29 DIAGNOSIS — K5900 Constipation, unspecified: Secondary | ICD-10-CM

## 2019-01-29 NOTE — Telephone Encounter (Signed)
Please advise 

## 2019-01-30 ENCOUNTER — Telehealth: Payer: Self-pay | Admitting: Gastroenterology

## 2019-01-30 DIAGNOSIS — K5900 Constipation, unspecified: Secondary | ICD-10-CM

## 2019-01-30 NOTE — Telephone Encounter (Signed)
Certainly that is a good sign.  She was having diarrhea before. Can start taking Colace 1 tablet p.o. once a day Increase water intake.  RG

## 2019-01-30 NOTE — Telephone Encounter (Signed)
I have called patient and left message for her to return my call.  

## 2019-01-31 NOTE — Telephone Encounter (Signed)
Patient says she has been taking the Colace with no relief and would like to know what else she could try.

## 2019-01-31 NOTE — Telephone Encounter (Signed)
Can try lactulose (low dose) 15 cc p.o. twice daily until good bowel movement, then 15 cc p.o. once a day.  Please call in 1 bottle, 2 refills.  RG

## 2019-02-01 MED ORDER — LACTULOSE 20 GM/30ML PO SOLN: 15.0000 mL | Freq: Two times a day (BID) | ORAL | 2 refills | Status: DC

## 2019-02-01 NOTE — Telephone Encounter (Signed)
I have notified patient and sent prescription to pharmacy.

## 2019-02-01 NOTE — Telephone Encounter (Signed)
Pt states she has taken several Colace and they are not working.

## 2019-02-01 NOTE — Telephone Encounter (Signed)
Per previous documentation-Dr. Lyndel Safe wants patient to try [facility] sent to pharmacy of patient choice; Patient advised to call back to the office at [phone] should questions/concerns arise; Patient verbalized understanding of information/instructions;

## 2019-02-05 ENCOUNTER — Encounter (HOSPITAL_BASED_OUTPATIENT_CLINIC_OR_DEPARTMENT_OTHER): Payer: Self-pay

## 2019-02-05 ENCOUNTER — Other Ambulatory Visit: Payer: Self-pay

## 2019-02-06 NOTE — Progress Notes (Signed)
Location of Breast Cancer: Left Breast  Histology per Pathology Report:  01/04/19 Diagnosis 1. Breast, left, needle core biopsy, upper inner quadrant, 11 o'clock - INVASIVE MAMMARY CARCINOMA, SEE COMMENT. - MAMMARY CARCINOMA IN SITU. 2. Breast, left, needle core biopsy, LUIQ 11:30 o'clock - FOCAL INVASIVE MAMMARY CARCINOMA. - LOBULAR NEOPLASIA (ATYPICAL LOBULAR HYPERPLASIA).  Receptor Status: ER(100%), PR (90%), Her2-neu (NEG), Ki-(15%)  Did patient present with symptoms or was this found on screening mammography?: It was found on a screening mammogram.   Past/Anticipated interventions by surgeon, if any: Dr. Brantley Stage, surgery scheduled for 02/14/19.  Past/Anticipated interventions by medical oncology, if any:  01/16/19 Dr. Lindi Adie Recommendations: 1. Breast conserving surgery followed by 2.  Plus or minus adjuvant radiation therapy followed by 3. Adjuvant antiestrogen therapy  Return to clinic after surgery to discuss final pathology report and then determine the adjuvant treatment plan Since the patient will be staying with her daughter in order as well, I will do a telephone follow-up visit 1 week after surgery on 02/21/2019 All questions were answered.    Lymphedema issues, if any:  N/A  Pain issues, if any:  She denies.   SAFETY ISSUES:  Prior radiation? No  Pacemaker/ICD? No  Possible current pregnancy? No  Is the patient on methotrexate? No  Current Complaints / other details:      Rose [name], Stephani Police, RN 02/06/2019,12:06 PM

## 2019-02-07 ENCOUNTER — Encounter (HOSPITAL_BASED_OUTPATIENT_CLINIC_OR_DEPARTMENT_OTHER)
Admission: RE | Admit: 2019-02-07 | Discharge: 2019-02-07 | Disposition: A | Discharge status: Home / Self Care | Payer: Medicare Other | Source: Ambulatory Visit | Attending: Surgery | Admitting: Surgery

## 2019-02-07 DIAGNOSIS — Z01812 Encounter for preprocedural laboratory examination: Secondary | ICD-10-CM | POA: Insufficient documentation

## 2019-02-07 LAB — COMPREHENSIVE METABOLIC PANEL
ALT: 19 U/L (ref 0–44)
AST: 21 U/L (ref 15–41)
Albumin: 3.8 g/dL (ref 3.5–5.0)
Alkaline Phosphatase: 48 U/L (ref 38–126)
Anion gap: 11 (ref 5–15)
BUN: 22 mg/dL (ref 8–23)
CO2: 28 mmol/L (ref 22–32)
Calcium: 10.2 mg/dL (ref 8.9–10.3)
Chloride: 104 mmol/L (ref 98–111)
Creatinine, Ser: 1.03 mg/dL — ABNORMAL HIGH (ref 0.44–1.00)
GFR calc Af Amer: 59 mL/min — ABNORMAL LOW (ref >60)
GFR calc non Af Amer: 51 mL/min — ABNORMAL LOW (ref >60)
Glucose, Bld: 123 mg/dL — ABNORMAL HIGH (ref 70–99)
Potassium: 4.4 mmol/L (ref 3.5–5.1)
Sodium: 143 mmol/L (ref 135–145)
Total Bilirubin: 0.4 mg/dL (ref 0.3–1.2)
Total Protein: 6.9 g/dL (ref 6.5–8.1)

## 2019-02-07 LAB — CBC WITH DIFFERENTIAL/PLATELET
Abs Immature Granulocytes: 0.01 10*3/uL (ref 0.00–0.07)
Basophils Absolute: 0 10*3/uL (ref 0.0–0.1)
Basophils Relative: 0 %
Eosinophils Absolute: 0 10*3/uL (ref 0.0–0.5)
Eosinophils Relative: 1 %
HCT: 33.9 % — ABNORMAL LOW (ref 36.0–46.0)
Hemoglobin: 10.9 g/dL — ABNORMAL LOW (ref 12.0–15.0)
Immature Granulocytes: 0 %
Lymphocytes Relative: 32 %
Lymphs Abs: 1.6 10*3/uL (ref 0.7–4.0)
MCH: 32.3 pg (ref 26.0–34.0)
MCHC: 32.2 g/dL (ref 30.0–36.0)
MCV: 100.6 fL — ABNORMAL HIGH (ref 80.0–100.0)
Monocytes Absolute: 0.4 10*3/uL (ref 0.1–1.0)
Monocytes Relative: 9 %
Neutro Abs: 2.9 10*3/uL (ref 1.7–7.7)
Neutrophils Relative %: 58 %
Platelets: 160 10*3/uL (ref 150–400)
RBC: 3.37 MIL/uL — ABNORMAL LOW (ref 3.87–5.11)
RDW: 13.4 % (ref 11.5–15.5)
WBC: 5 10*3/uL (ref 4.0–10.5)
nRBC: 0 % (ref 0.0–0.2)

## 2019-02-07 NOTE — Progress Notes (Signed)
Attempted to give Ensure Pre-Surgery drink to patient.  Patient asked "Do I have to drink this?" Explained purpose of drink and patient declined to accept drink stating "I won't drink it."  Surgical scrub also given to patient with instructions for use. Patient verbalized understanding.

## 2019-02-09 ENCOUNTER — Telehealth: Payer: Self-pay | Admitting: Radiation Oncology

## 2019-02-09 NOTE — Telephone Encounter (Signed)
Left message for patient to verify telephone visit for pre reg °

## 2019-02-10 ENCOUNTER — Other Ambulatory Visit (HOSPITAL_COMMUNITY): Payer: Medicare Other

## 2019-02-12 ENCOUNTER — Ambulatory Visit
Admission: RE | Admit: 2019-02-12 | Discharge: 2019-02-12 | Disposition: A | Discharge status: Home / Self Care | Payer: Medicare Other | Source: Ambulatory Visit | Attending: Radiation Oncology | Admitting: Radiation Oncology

## 2019-02-12 ENCOUNTER — Encounter: Payer: Self-pay | Admitting: Radiation Oncology

## 2019-02-12 ENCOUNTER — Other Ambulatory Visit: Payer: Self-pay

## 2019-02-12 DIAGNOSIS — C50212 Malignant neoplasm of upper-inner quadrant of left female breast: Secondary | ICD-10-CM | POA: Diagnosis not present

## 2019-02-12 DIAGNOSIS — Z170 Estrogen receptor positive status [ER+]: Secondary | ICD-10-CM

## 2019-02-12 NOTE — Progress Notes (Signed)
Radiation Oncology         [phone] ________________________________  Initial outpatient Consultation by telephone as patient was unable to access MyChart video during pandemic precautions   Name: Rose [name] MRN: [id number]  Date: 02/12/2019  DOB: 02-09-1941  QQ:[id number], Sidney Ace, MD   REFERRING PHYSICIAN: Erroll Luna, MD  DIAGNOSIS:    ICD-10-CM   1. Malignant neoplasm of upper-inner quadrant of left breast in female, estrogen receptor positive ([place])  C50.212    Z17.0   Cancer Staging Malignant neoplasm of upper-inner quadrant of left breast in female, estrogen receptor positive ([place]) Staging form: Breast, AJCC 8th Edition - Clinical stage from 01/04/2019: Stage IA (cT1b, cN0, cM0, G2, ER+, PR+, HER2-) - Signed by Gardenia Phlegm, NP on 01/10/2019   CHIEF COMPLAINT: Here to discuss management of left breast cancer  [name] is a lovely 78 y.o. female who presented with breast abnormality on the following imaging: Screening left breast mammogram  This was followed by ultrasound of the left breast and axilla.  This revealed suspicious adjacent 1 cm and 6 mm masses at the upper inner left breast.  No suspicious left axillary lymph nodes.   Biopsy on 01/04/2019 of the upper inner quadrant of the left breast showed invasive mammary carcinoma with mammary carcinoma in situ.  2 biopsies were obtained, both positive for invasive mammary carcinoma.  These biopsies were at 11:00 and 1130 o'clock.  Carcinoma is grade 2.  The 2 biopsies had similar morphology.  The carcinoma is ER positive PR positive HER-2 negative.  MRI of the breasts showed enhancing mass and nonmasslike enhancement at the sites of the biopsies, spanning approximately 1.5 cm.  No suspicious lymphadenopathy bilaterally or evidence of malignancy in the right breast.  She is in her usual state of health.  She anticipates lumpectomy in early  December with Dr. Brantley Stage.  She anticipates taking the antiestrogen pill adjuvantly.  She denies any pain.  PREVIOUS RADIATION THERAPY: No  PAST MEDICAL HISTORY:  has a past medical history of Abnormal blood pressure, Allergy, Anemia, Anxiety, Atherosclerosis of renal artery (HCC), Attention deficit disorder without mention of hyperactivity, Basal cell carcinoma of face, Benign heart murmur, Blood transfusion without reported diagnosis, Carotid artery disease ([place]), Carotid bruit, Chronic bronchitis ([place]), Depression, DJD (degenerative joint disease) of hip, Esophageal reflux, History of depression, History of spinal fusion, Hypertension, Morton's neuroma, Myalgia and myositis, unspecified, Other tenosynovitis of hand and wrist, Pancreatitis, Peripheral neuropathy, Personal history of alcoholism ([place]), Personal history of peptic ulcer disease, Pulmonary nodule, Rotator cuff disorder, Solitary cyst of breast, Subclavian steal syndrome, Substance abuse ([place]), Unspecified hypothyroidism, Urinary incontinence, and Wears glasses.    PAST SURGICAL HISTORY: Past Surgical History:  Procedure Laterality Date   APPENDECTOMY     CARPOMETACARPEL SUSPENSION PLASTY Right 08/07/2013   Procedure: CARPOMETACARPEL [facility]) SUSPENSION PLASTY RIGHT THUMB;  Surgeon: Wynonia Sours, MD;  Location: [place];  Service: Orthopedics;  Laterality: Right;   CARPOMETACARPEL SUSPENSION PLASTY Left 10/23/2015   Procedure: SUSPENSION PLASTY LEFT THUMB TRAPEZIUM EXCISION ABDUCTOR POLLICIS LONGUS TRANSFER;  Surgeon: Daryll Brod, MD;  Location: [place];  Service: Orthopedics;  Laterality: Left;   CERVICAL FUSION  2007   Dr. Valli Glance approach   COLONOSCOPY     JOINT REPLACEMENT Left    Left brachial artery repair of pseudoaneurysm  2001   post a cath   LUMBAR FUSION  09/2004   T12-L5 (Dr. Patrice Paradise)  MINOR IRRIGATION AND DEBRIDEMENT OF WOUND Right 08/27/2014   Procedure: MINOR IRRIGATION AND  DEBRIDEMENT OF WOUND;  Surgeon: Daryll Brod, MD;  Location: [place];  Service: Orthopedics;  Laterality: Right;   ORIF TIBIA FRACTURE  2010   Left distal   Percutanous Transluminal Angioplasty of Renal Arteris     REPAIR EXTENSOR TENDON Right 09/26/2014   Procedure: REPAIR EXTENSOR TENDON RIGHT RING FINGER ;  Surgeon: Daryll Brod, MD;  Location: [place];  Service: Orthopedics;  Laterality: Right;   SHOULDER ARTHROSCOPY  09/2008   Left   TONSILLECTOMY AND ADENOIDECTOMY     TOTAL HIP ARTHROPLASTY  2008   left   WISDOM TOOTH EXTRACTION      FAMILY HISTORY: family history includes Healthy in her daughter and son; Hypertension in her daughter; Schizophrenia in her son. She was adopted.  SOCIAL HISTORY:  reports that she quit smoking about 35 years ago. Her smoking use included cigarettes. She has a 25.00 pack-year smoking history. She has never used smokeless tobacco. She reports previous alcohol use. She reports that she does not use drugs.  ALLERGIES: Amoxicillin, Codeine, Hydrocodone-acetaminophen, Hydromorphone hcl, Morphine and related, Morphine sulfate, and Nsaids  MEDICATIONS:  Current Outpatient Medications  Medication Sig Dispense Refill   alendronate (FOSAMAX) 70 MG tablet TAKE 1 TABLET BY MOUTH ONCE WEEKLY BEFORE BREAKFAST, ON AN EMPTY STOMACH: REMAIN UPRIGHT FOR 30 MINUTES:TAKE WITH 8 OUNCES OF WATER (Patient taking differently: Take 70 mg by mouth once a week. TAKE 1 TABLET BY MOUTH ONCE WEEKLY BEFORE BREAKFAST, ON AN EMPTY STOMACH: REMAIN UPRIGHT FOR 30 MINUTES:TAKE WITH 8 OUNCES OF WATER  Each Sunday) 4 tablet 12   amLODipine (NORVASC) 5 MG tablet 1-2 tab po daily 60 tablet 0   aspirin 81 MG tablet Take 81 mg by mouth daily.      atomoxetine (STRATTERA) 100 MG capsule Take 100 mg by mouth daily.     cloNIDine (CATAPRES) 0.1 MG tablet TAKE ONE TABLET BY MOUTH THREE TIMES A DAY 90 tablet 2   DULoxetine (CYMBALTA) 60 MG capsule Take 1  capsule (60 mg total) by mouth daily. 90 capsule 3   ferrous sulfate dried (SLOW FE) 160 (50 FE) MG TBCR Take 160 mg by mouth 2 (two) times daily. Patient has been taking Once Daily     Glucosamine 500 MG TABS Take 1 tablet by mouth daily. Glucosamine-Chondronton     Lactulose 20 GM/30ML SOLN Take 15 mLs (10 g total) by mouth 2 (two) times daily. Until having good bowel movements, then take once daily 1892 mL 2   levothyroxine (SYNTHROID, LEVOTHROID) 125 MCG tablet TAKE ONE TABLET BY MOUTH DAILY 90 tablet 2   losartan (COZAAR) 100 MG tablet TAKE ONE TABLET BY MOUTH DAILY 90 tablet 0   oxybutynin (DITROPAN-XL) 10 MG 24 hr tablet TAKE ONE TABLET BY MOUTH EVERY NIGHT AT BEDTIME 30 tablet 11   No current facility-administered medications for this encounter.     REVIEW OF SYSTEMS: As above  PHYSICAL EXAM:  vitals were not taken for this visit.   General: Alert and oriented, in no acute distress   LABORATORY DATA:  Lab Results  Component Value Date   WBC 5.0 02/07/2019   HGB 10.9 (L) 02/07/2019   HCT 33.9 (L) 02/07/2019   MCV 100.6 (H) 02/07/2019   PLT 160 02/07/2019   CMP     Component Value Date/Time   NA 143 02/07/2019 1430   K 4.4 02/07/2019 1430  CL 104 02/07/2019 1430   CO2 28 02/07/2019 1430   GLUCOSE 123 (H) 02/07/2019 1430   BUN 22 02/07/2019 1430   CREATININE 1.03 (H) 02/07/2019 1430   CREATININE 1.05 (H) 04/07/2018 1510   CALCIUM 10.2 02/07/2019 1430   PROT 6.9 02/07/2019 1430   ALBUMIN 3.8 02/07/2019 1430   AST 21 02/07/2019 1430   ALT 19 02/07/2019 1430   ALKPHOS 48 02/07/2019 1430   BILITOT 0.4 02/07/2019 1430   GFRNONAA 51 (L) 02/07/2019 1430   GFRNONAA 56 (L) 08/02/2013 1122   GFRAA 59 (L) 02/07/2019 1430   GFRAA 65 08/02/2013 1122         RADIOGRAPHY: Mr Breast Bilateral W Wo Contrast Inc Cad  Result Date: 01/30/2019 CLINICAL DATA:  78 year old female with 2 adjacent biopsy proven sites of left breast cancer. LABS:  None performed today and  site. EXAM: BILATERAL BREAST MRI WITH AND WITHOUT CONTRAST TECHNIQUE: Multiplanar, multisequence MR images of both breasts were obtained prior to and following the intravenous administration of 5 ml of Gadavist. Three-dimensional MR images were rendered by post-processing of the original MR data on an independent workstation. The three-dimensional MR images were interpreted, and findings are reported in the following complete MRI report for this study. Three dimensional images were evaluated at the independent DynaCad workstation Please note, evaluation of the inferior medial aspect of the left breast slightly limited due to inhomogeneous fat saturation artifact. The patient declined to return for additional imaging even the limitation have been explained to her. COMPARISON:  Previous exam(s). FINDINGS: Breast composition: c. Heterogeneous fibroglandular tissue. Background parenchymal enhancement: Mild. Right breast: No mass or abnormal enhancement. Left breast: There is an irregular, enhancing 7 x 6 x 6 mm mass in the upper inner quadrant at posterior depth. Additional non-mass enhancement is seen extending anteriorly by approximately 1.6 cm. This region encompasses the 2 sites of biopsy-proven malignancy. There is associated susceptibility artifact from post biopsy clips. Otherwise, no mass or abnormal enhancement. Please note evaluation of the inferior and medial aspect of the left breast is slightly limited due to inhomogeneous fat saturation artifact. Lymph nodes: No abnormal appearing lymph nodes. Ancillary findings:  None. IMPRESSION: 1. Enhancing mass and and associated non-mass enhancement in association with the patient's 2 sites of biopsy-proven malignancy. Overall extent spans approximately 1.5 cm in the AP dimension. 2. No additional suspicious enhancement within the remainder of the left breast. Please note, evaluation of the inferior medial aspect of the left breast is limited due to inhomogeneous fat  saturation artifact. The patient declined to return for additional imaging after the limitation was explained to her. 3. No MRI evidence of malignancy on the right. 4. No suspicious lymphadenopathy. RECOMMENDATION: Per clinical treatment plan. BI-RADS CATEGORY  6: Known biopsy-proven malignancy. Electronically Signed   By: Kristopher Oppenheim M.D.   On: 01/30/2019 15:10      IMPRESSION/PLAN: This is a lovely 78 year old woman with left breast cancer.  This is ER positive and clinically stage I.   For the patient's early stage favorable risk breast cancer, we had a thorough discussion about her options for adjuvant therapy. One option would be antiestrogen therapy as discussed with medical oncology. She would take a pill for approximately 5 years.  The more aggressive option would be to pursue both modalities.  Of note, I discussed the data from the [employer] al trial in the [place] [employer]. She understands that tamoxifen compared to radiation plus tamoxifen demonstrated no survival benefit  among the women in this study. The women were 72 years or older with stage I estrogen receptor positive breast cancer. Based on this study, I told the patient that her overall life expectancy should not be affected by adding radiotherapy to antiestrogen medication. She understands that the main benefit of  adding radiotherapy to anti estrogen therapy would be a very small but measurable local control benefit (risk of local recurrence to be lowered from ~10% --> ~2% over a decade).  She would like to think about the side effects of antiestrogen therapy and radiation therapy before making her decision.   We discussed the risks benefits and side effects of radiotherapy.  I reiterated that radiotherapy is absolutely optional for stage I ER positive cancer in her age group; she understands that the side effects would likely include some skin irritation and fatigue during the weeks of radiation.  I would anticipate  delivering approximately 3-4 weeks of radiotherapy.  After a thorough discussion, the patient would like to think about her options.  I recommend a post-op followup with me by telephone so we can review her pathology and discuss her options further.    This encounter was provided by telemedicine platform by telephone as patient was unable to access MyChart video during pandemic precautions The patient has given verbal consent for this type of encounter and has been advised to only accept a meeting of this type in a secure network environment. The time spent during this encounter was 10 minutes. The attendants for this meeting include Eppie Gibson  and Chriss Czar.  During the encounter, Eppie Gibson was located at [facility] Radiation Oncology Department.  Chriss Czar was located at home.     __________________________________________   Eppie Gibson, MD

## 2019-02-13 ENCOUNTER — Telehealth: Payer: Self-pay | Admitting: Hematology and Oncology

## 2019-02-13 ENCOUNTER — Inpatient Hospital Stay: Admission: RE | Admit: 2019-02-13 | Payer: Medicare Other | Source: Ambulatory Visit

## 2019-02-13 NOTE — Telephone Encounter (Signed)
Returned patient's phone call regarding transferring care to [facility], cancelled 12/09 per patient's request.

## 2019-02-14 ENCOUNTER — Ambulatory Visit (HOSPITAL_COMMUNITY): Payer: Medicare Other

## 2019-02-16 ENCOUNTER — Other Ambulatory Visit (HOSPITAL_COMMUNITY)
Admission: RE | Admit: 2019-02-16 | Discharge: 2019-02-16 | Disposition: A | Discharge status: Home / Self Care | Payer: Medicare Other | Source: Ambulatory Visit | Attending: Surgery | Admitting: Surgery

## 2019-02-16 DIAGNOSIS — Z20828 Contact with and (suspected) exposure to other viral communicable diseases: Secondary | ICD-10-CM | POA: Insufficient documentation

## 2019-02-16 DIAGNOSIS — Z01812 Encounter for preprocedural laboratory examination: Secondary | ICD-10-CM | POA: Diagnosis not present

## 2019-02-16 LAB — SARS CORONAVIRUS 2 (TAT 6-24 HRS): SARS Coronavirus 2: NEGATIVE

## 2019-02-19 ENCOUNTER — Other Ambulatory Visit: Payer: Self-pay

## 2019-02-19 ENCOUNTER — Ambulatory Visit
Admission: RE | Admit: 2019-02-19 | Discharge: 2019-02-19 | Disposition: A | Discharge status: Home / Self Care | Payer: Medicare Other | Source: Ambulatory Visit | Attending: Surgery | Admitting: Surgery

## 2019-02-19 DIAGNOSIS — C50912 Malignant neoplasm of unspecified site of left female breast: Secondary | ICD-10-CM

## 2019-02-19 DIAGNOSIS — C50212 Malignant neoplasm of upper-inner quadrant of left female breast: Secondary | ICD-10-CM | POA: Diagnosis not present

## 2019-02-20 ENCOUNTER — Ambulatory Visit
Admission: RE | Admit: 2019-02-20 | Discharge: 2019-02-20 | Disposition: A | Discharge status: Home / Self Care | Payer: Medicare Other | Source: Ambulatory Visit | Attending: Surgery | Admitting: Surgery

## 2019-02-20 ENCOUNTER — Encounter (HOSPITAL_BASED_OUTPATIENT_CLINIC_OR_DEPARTMENT_OTHER): Payer: Self-pay | Admitting: *Deleted

## 2019-02-20 ENCOUNTER — Ambulatory Visit (HOSPITAL_BASED_OUTPATIENT_CLINIC_OR_DEPARTMENT_OTHER)
Admission: RE | Admit: 2019-02-20 | Discharge: 2019-02-20 | Disposition: A | Discharge status: Home / Self Care | Payer: Medicare Other | Attending: Surgery | Admitting: Surgery

## 2019-02-20 ENCOUNTER — Ambulatory Visit (HOSPITAL_BASED_OUTPATIENT_CLINIC_OR_DEPARTMENT_OTHER): Payer: Medicare Other | Admitting: Anesthesiology

## 2019-02-20 ENCOUNTER — Other Ambulatory Visit: Payer: Self-pay

## 2019-02-20 ENCOUNTER — Encounter (HOSPITAL_BASED_OUTPATIENT_CLINIC_OR_DEPARTMENT_OTHER)
Admission: RE | Disposition: A | Discharge status: Home / Self Care | Payer: Self-pay | Source: Home / Self Care | Attending: Surgery

## 2019-02-20 ENCOUNTER — Ambulatory Visit (HOSPITAL_COMMUNITY)
Admission: RE | Admit: 2019-02-20 | Discharge: 2019-02-20 | Disposition: A | Discharge status: Home / Self Care | Payer: Medicare Other | Source: Ambulatory Visit | Attending: Surgery | Admitting: Surgery

## 2019-02-20 DIAGNOSIS — C50912 Malignant neoplasm of unspecified site of left female breast: Secondary | ICD-10-CM

## 2019-02-20 DIAGNOSIS — Z7982 Long term (current) use of aspirin: Secondary | ICD-10-CM | POA: Diagnosis not present

## 2019-02-20 DIAGNOSIS — Z79899 Other long term (current) drug therapy: Secondary | ICD-10-CM | POA: Insufficient documentation

## 2019-02-20 DIAGNOSIS — Z79890 Hormone replacement therapy (postmenopausal): Secondary | ICD-10-CM | POA: Diagnosis not present

## 2019-02-20 DIAGNOSIS — Z87891 Personal history of nicotine dependence: Secondary | ICD-10-CM | POA: Diagnosis not present

## 2019-02-20 DIAGNOSIS — Z170 Estrogen receptor positive status [ER+]: Secondary | ICD-10-CM | POA: Diagnosis not present

## 2019-02-20 DIAGNOSIS — Z85820 Personal history of malignant melanoma of skin: Secondary | ICD-10-CM | POA: Insufficient documentation

## 2019-02-20 DIAGNOSIS — E039 Hypothyroidism, unspecified: Secondary | ICD-10-CM | POA: Diagnosis not present

## 2019-02-20 DIAGNOSIS — I10 Essential (primary) hypertension: Secondary | ICD-10-CM | POA: Diagnosis not present

## 2019-02-20 DIAGNOSIS — C50212 Malignant neoplasm of upper-inner quadrant of left female breast: Secondary | ICD-10-CM | POA: Insufficient documentation

## 2019-02-20 DIAGNOSIS — G8918 Other acute postprocedural pain: Secondary | ICD-10-CM | POA: Diagnosis not present

## 2019-02-20 HISTORY — PX: BREAST LUMPECTOMY WITH RADIOACTIVE SEED AND SENTINEL LYMPH NODE BIOPSY: SHX6550

## 2019-02-20 SURGERY — BREAST LUMPECTOMY WITH RADIOACTIVE SEED AND SENTINEL LYMPH NODE BIOPSY
Anesthesia: General | Site: Breast | Laterality: Left

## 2019-02-20 MED ORDER — MIDAZOLAM HCL 2 MG/2ML IJ SOLN: 1.0000 mg | INTRAMUSCULAR | Status: DC | PRN

## 2019-02-20 MED ORDER — TECHNETIUM TC 99M SULFUR COLLOID FILTERED
1.0000 | Freq: Once | INTRAVENOUS | Status: AC | PRN
  Administered 2019-02-20: 1 via INTRADERMAL

## 2019-02-20 MED ORDER — PHENYLEPHRINE 40 MCG/ML (10ML) SYRINGE FOR IV PUSH (FOR BLOOD PRESSURE SUPPORT)
PREFILLED_SYRINGE | INTRAVENOUS | Status: DC | PRN
  Administered 2019-02-20 (×3): 80 ug via INTRAVENOUS
  Administered 2019-02-20: 120 ug via INTRAVENOUS
  Administered 2019-02-20 (×2): 40 ug via INTRAVENOUS

## 2019-02-20 MED ORDER — BUPIVACAINE HCL (PF) 0.25 % IJ SOLN
INTRAMUSCULAR | Status: DC | PRN
  Administered 2019-02-20: 10 mL

## 2019-02-20 MED ORDER — CLINDAMYCIN PHOSPHATE 900 MG/50ML IV SOLN
INTRAVENOUS | Status: AC
  Filled 2019-02-20: qty 50

## 2019-02-20 MED ORDER — MIDAZOLAM HCL 2 MG/2ML IJ SOLN
INTRAMUSCULAR | Status: AC
  Filled 2019-02-20: qty 2

## 2019-02-20 MED ORDER — FENTANYL CITRATE (PF) 100 MCG/2ML IJ SOLN
INTRAMUSCULAR | Status: AC
  Filled 2019-02-20: qty 2

## 2019-02-20 MED ORDER — DEXAMETHASONE SODIUM PHOSPHATE 10 MG/ML IJ SOLN
INTRAMUSCULAR | Status: DC | PRN
  Administered 2019-02-20: 5 mg via INTRAVENOUS

## 2019-02-20 MED ORDER — ROPIVACAINE HCL 5 MG/ML IJ SOLN
INTRAMUSCULAR | Status: DC | PRN
  Administered 2019-02-20: 30 mL via PERINEURAL

## 2019-02-20 MED ORDER — ONDANSETRON HCL 4 MG/2ML IJ SOLN: 4.0000 mg | Freq: Once | INTRAMUSCULAR | Status: DC | PRN

## 2019-02-20 MED ORDER — ACETAMINOPHEN 10 MG/ML IV SOLN: 1000.0000 mg | Freq: Once | INTRAVENOUS | Status: DC | PRN

## 2019-02-20 MED ORDER — FENTANYL CITRATE (PF) 100 MCG/2ML IJ SOLN: 25.0000 ug | INTRAMUSCULAR | Status: DC | PRN

## 2019-02-20 MED ORDER — FENTANYL CITRATE (PF) 100 MCG/2ML IJ SOLN
50.0000 ug | INTRAMUSCULAR | Status: DC | PRN
  Administered 2019-02-20: 25 ug via INTRAVENOUS

## 2019-02-20 MED ORDER — CLINDAMYCIN PHOSPHATE 900 MG/50ML IV SOLN
900.0000 mg | INTRAVENOUS | Status: AC
  Administered 2019-02-20: 900 mg via INTRAVENOUS

## 2019-02-20 MED ORDER — LACTATED RINGERS IV SOLN
INTRAVENOUS | Status: DC
  Administered 2019-02-20 (×2): via INTRAVENOUS

## 2019-02-20 MED ORDER — PROPOFOL 10 MG/ML IV BOLUS
INTRAVENOUS | Status: DC | PRN
  Administered 2019-02-20: 70 mg via INTRAVENOUS

## 2019-02-20 MED ORDER — SODIUM CHLORIDE (PF) 0.9 % IJ SOLN
INTRAVENOUS | Status: DC | PRN
  Administered 2019-02-20: 5 mL

## 2019-02-20 MED ORDER — ONDANSETRON HCL 4 MG/2ML IJ SOLN
INTRAMUSCULAR | Status: DC | PRN
  Administered 2019-02-20: 4 mg via INTRAVENOUS

## 2019-02-20 MED ORDER — ACETAMINOPHEN 325 MG PO TABS: 650.0000 mg | ORAL_TABLET | ORAL | 2 refills | Status: DC | PRN

## 2019-02-20 MED ORDER — CHLORHEXIDINE GLUCONATE CLOTH 2 % EX PADS: 6.0000 | MEDICATED_PAD | Freq: Once | CUTANEOUS | Status: DC

## 2019-02-20 MED ORDER — CLONIDINE HCL (ANALGESIA) 100 MCG/ML EP SOLN
EPIDURAL | Status: DC | PRN
  Administered 2019-02-20: 100 ug

## 2019-02-20 SURGICAL SUPPLY — 53 items
ADH SKN CLS APL DERMABOND .7 (GAUZE/BANDAGES/DRESSINGS) ×1
APL PRP STRL LF DISP 70% ISPRP (MISCELLANEOUS) ×1
APPLIER CLIP 9.375 MED OPEN (MISCELLANEOUS) ×2
APR CLP MED 9.3 20 MLT OPN (MISCELLANEOUS) ×1
BINDER BREAST LRG (GAUZE/BANDAGES/DRESSINGS) IMPLANT
BINDER BREAST MEDIUM (GAUZE/BANDAGES/DRESSINGS) ×1 IMPLANT
BINDER BREAST XLRG (GAUZE/BANDAGES/DRESSINGS) IMPLANT
BINDER BREAST XXLRG (GAUZE/BANDAGES/DRESSINGS) IMPLANT
BLADE SURG 15 STRL LF DISP TIS (BLADE) ×1 IMPLANT
BLADE SURG 15 STRL SS (BLADE) ×2
CANISTER SUC SOCK COL 7IN (MISCELLANEOUS) IMPLANT
CANISTER SUCT 1200ML W/VALVE (MISCELLANEOUS) ×2 IMPLANT
CHLORAPREP W/TINT 26 (MISCELLANEOUS) ×2 IMPLANT
CLIP APPLIE 9.375 MED OPEN (MISCELLANEOUS) ×1 IMPLANT
COVER BACK TABLE REUSABLE LG (DRAPES) ×2 IMPLANT
COVER MAYO STAND REUSABLE (DRAPES) ×2 IMPLANT
COVER PROBE W GEL 5X96 (DRAPES) ×2 IMPLANT
COVER WAND RF STERILE (DRAPES) IMPLANT
DECANTER SPIKE VIAL GLASS SM (MISCELLANEOUS) IMPLANT
DERMABOND ADVANCED (GAUZE/BANDAGES/DRESSINGS) ×1
DERMABOND ADVANCED .7 DNX12 (GAUZE/BANDAGES/DRESSINGS) ×1 IMPLANT
DRAPE LAPAROSCOPIC ABDOMINAL (DRAPES) ×2 IMPLANT
DRAPE UTILITY XL STRL (DRAPES) ×2 IMPLANT
ELECT COATED BLADE 2.86 ST (ELECTRODE) ×2 IMPLANT
ELECT REM PT RETURN 9FT ADLT (ELECTROSURGICAL) ×2
ELECTRODE REM PT RTRN 9FT ADLT (ELECTROSURGICAL) ×1 IMPLANT
GLOVE BIO SURGEON STRL SZ 6.5 (GLOVE) ×4 IMPLANT
GLOVE BIOGEL PI IND STRL 7.0 (GLOVE) IMPLANT
GLOVE BIOGEL PI IND STRL 8 (GLOVE) ×1 IMPLANT
GLOVE BIOGEL PI INDICATOR 7.0 (GLOVE) ×1
GLOVE BIOGEL PI INDICATOR 8 (GLOVE) ×1
GLOVE ECLIPSE 8.0 STRL XLNG CF (GLOVE) ×2 IMPLANT
GOWN STRL REUS W/ TWL LRG LVL3 (GOWN DISPOSABLE) ×2 IMPLANT
GOWN STRL REUS W/TWL LRG LVL3 (GOWN DISPOSABLE) ×4
HEMOSTAT ARISTA ABSORB 3G PWDR (HEMOSTASIS) IMPLANT
HEMOSTAT SNOW SURGICEL 2X4 (HEMOSTASIS) IMPLANT
KIT MARKER MARGIN INK (KITS) ×2 IMPLANT
NDL HYPO 25X1 1.5 SAFETY (NEEDLE) ×1 IMPLANT
NDL SAFETY ECLIPSE 18X1.5 (NEEDLE) IMPLANT
NEEDLE HYPO 18GX1.5 SHARP (NEEDLE)
NEEDLE HYPO 25X1 1.5 SAFETY (NEEDLE) ×2 IMPLANT
NS IRRIG 1000ML POUR BTL (IV SOLUTION) ×2 IMPLANT
PACK BASIN DAY SURGERY FS (CUSTOM PROCEDURE TRAY) ×2 IMPLANT
PENCIL SMOKE EVACUATOR (MISCELLANEOUS) ×2 IMPLANT
SLEEVE SCD COMPRESS KNEE MED (MISCELLANEOUS) ×2 IMPLANT
SPONGE LAP 4X18 RFD (DISPOSABLE) ×4 IMPLANT
SUT MNCRL AB 4-0 PS2 18 (SUTURE) ×3 IMPLANT
SUT VICRYL 3-0 CR8 SH (SUTURE) ×2 IMPLANT
SYR CONTROL 10ML LL (SYRINGE) ×2 IMPLANT
TOWEL GREEN STERILE FF (TOWEL DISPOSABLE) ×2 IMPLANT
TRAY FAXITRON CT DISP (TRAY / TRAY PROCEDURE) ×2 IMPLANT
TUBE CONNECTING 20X1/4 (TUBING) ×2 IMPLANT
YANKAUER SUCT BULB TIP NO VENT (SUCTIONS) ×2 IMPLANT

## 2019-02-20 NOTE — Progress Notes (Signed)
Assisted Dr. Valma Cava with left, ultrasound guided, pectoralis block. Side rails up, monitors on throughout procedure. See vital signs in flow sheet. Tolerated Procedure well.

## 2019-02-20 NOTE — Interval H&P Note (Signed)
History and Physical Interval Note:  02/20/2019 2:07 PM  Rose [name]  has presented today for surgery, with the diagnosis of mutilfocal left breast cancer.  The various methods of treatment have been discussed with the patient and family. After consideration of risks, benefits and other options for treatment, the patient has consented to  Procedure(s): LEFT BREAST LUMPECTOMY X2 WITH RADIOACTIVE SEED X2 AND SENTINEL LYMPH NODE MAPPING (Left) as a surgical intervention.  The patient's history has been reviewed, patient examined, no change in status, stable for surgery.  I have reviewed the patient's chart and labs.  Questions were answered to the patient's satisfaction.     [place]

## 2019-02-20 NOTE — Op Note (Signed)
Preop diagnosis: Stage I left breast cancer upper inner quadrant  Postoperative diagnosis: Same  Procedure: Left breast seed localized lumpectomy x2 and left axillary sentinel lymph node mapping with methylene blue dye of deep left axillary sentinel nodes  Surgeon: Erroll Luna, MD  Anesthesia: LMA with pectoral block and 0.25% Sensorcaine plain local  EBL: 30 cc  Specimen: Left breast tissue with 2 clips and a seed.  The second seed was extruded and sent separately in the specimen.  Radiograph revealed seeds and tissue to be present.  Additional margins of superior, inferior, medial, lateral taken.  The superficial margin was skin and the deep margin was pectoralis muscle.  Drains: None  IV fluids: Per anesthesia record  Indications for procedure: The patient is an 78 year old female who underwent work-up and was found to have a left breast cancer in the upper inner quadrant.  She opted for left breast lumpectomy with sentinel lymph node mapping after discussion of all of her treatment options to include mastectomy reconstruction and the pros and cons of sentinel lymph node mapping as well.The procedure has been discussed with the patient. Alternatives to surgery have been discussed with the patient.  Risks of surgery include bleeding,  Infection,  Seroma formation, death,  and the need for further surgery.   The patient understands and wishes to proceed.Sentinel lymph node mapping and dissection has been discussed with the patient.  Risk of bleeding,  Infection,  Seroma formation,  Additional procedures,,  Shoulder weakness ,  Shoulder stiffness,  Nerve and blood vessel injury and reaction to the mapping dyes have been discussed.  Alternatives to surgery have been discussed with the patient.  The patient agrees to proceed.  Description of procedure: The patient was met in the holding area and questions were answered.  Neoprobe used to verify both seeds in her left breast in the upper inner  quadrant.  She underwent a pectoral block by anesthesia and nuclear medicine injection by the radiology technician.  All questions were answered left side was marked as correct.  Her films were available for review with her seeds in place.  She was brought to the operative room.  She is placed supine upon the OR table.  After induction of general esthesia, left breast was prepped and draped in sterile fashion timeout was performed.  Proper patient, procedure were verified.  4 cc of methylene blue dye were injected in the subareolar position and massaged for 5 minutes.  Neoprobe settings were changed to iodine.  The 2 seeds were in the left upper inner quadrant.  Transverse incision was made of the signals and all tissue around both clips were excised.  The more medial seed was intact and the more lateral seed was extruded as the tissue was manipulated.  This was recovered and sent separately.  The Faxitron revealed the 1 remaining seed into clip to be present.  I took for additional margins to ensure a negative margin.  Hemostasis was achieved with clips and cautery.  We then closed the wound with 3-0 Vicryl and 4 Monocryl.  Next, the neoprobe settings were changed to technetium.  Hotspot identified in the left axilla.  Incision was made and a blue lymphatic was encountered and was traced down to the level on left axillary node.  This was removed.  A second blue and hot node were identified as well and left level 1 lymph node basin.  Both were removed.  Background counts are grade 0.  Hemostasis achieved and wound closed  with 3-0 Vicryl and 4-0 Monocryl.  Dermabond applied.  Breast binder placed.  All counts were found to be correct.  The patient was awoke extubated taken to recovery in satisfactory condition.

## 2019-02-20 NOTE — Progress Notes (Signed)
nuc med injection performed by nuc med staff. VS stable. No additional medication required. Patient tolerated well.

## 2019-02-20 NOTE — Anesthesia Postprocedure Evaluation (Signed)
Anesthesia Post Note  Patient: Rose [name]  Procedure(s) Performed: LEFT BREAST LUMPECTOMY X2 WITH RADIOACTIVE SEED X2 AND SENTINEL LYMPH NODE MAPPING (Left Breast)     Patient location during evaluation: PACU Anesthesia Type: General Level of consciousness: awake and alert Pain management: pain level controlled Vital Signs Assessment: post-procedure vital signs reviewed and stable Respiratory status: spontaneous breathing, nonlabored ventilation, respiratory function stable and patient connected to nasal cannula oxygen Cardiovascular status: blood pressure returned to baseline and stable Postop Assessment: no apparent nausea or vomiting Anesthetic complications: no    Last Vitals:  Vitals:   02/20/19 1553 02/20/19 1600  BP: (!) 174/78 (!) 174/74  Pulse: 70 69  Resp: 14 13  Temp:    SpO2: 100% 100%    Last Pain:  Vitals:   02/20/19 1549  TempSrc:   PainSc: Asleep                 Correen Bubolz DAVID

## 2019-02-20 NOTE — Anesthesia Procedure Notes (Signed)
Anesthesia Regional Block: Pectoralis block   Pre-Anesthetic Checklist: ,, timeout performed, Correct Patient, Correct Site, Correct Laterality, Correct Procedure, Correct Position, site marked, Risks and benefits discussed,  Surgical consent,  Pre-op evaluation,  At surgeon's request and post-op pain management  Laterality: Left  Prep: chloraprep       Needles:  Injection technique: Single-shot  Needle Type: Echogenic Needle     Needle Length: 9cm  Needle Gauge: 21     Additional Needles:   Procedures:,,,, ultrasound used (permanent image in chart),,,,  Narrative:  Start time: 02/20/2019 1:59 PM End time: 02/20/2019 2:05 PM Injection made incrementally with aspirations every 5 mL.  Performed by: Personally  Anesthesiologist: Barnet Glasgow, MD  Additional Notes: Block assessed. Patient tolerated procedure well.

## 2019-02-20 NOTE — H&P (Signed)
Rose [name]  Location: [facility] Surgery Patient #: [id number] DOB: 10/27/1940 Single / Language: Rose [name] / Race: White Female  History of Present Illness  Patient words: Patient sent at the request of the breast center of [place] secondary due to mammographic abnormality noted on recent screening mammogram of her left breast. Masslike distortion was noted left breast upper inner quadrant. There are 2 foci of disease one being 1 cm and the other being 0.5 cm. Both were biopsied and found to be consistent with lobular carcinoma of the left breast upper inner quadrant as she receptor positive, progesterone receptor positive, HER-2/neu negative with a K-67 of 15%. She has no family history of breast cancer. She gives no history of breast mass, nipple discharge or change in the appearance of either breast. She has no breast pain.              ADDENDUM REPORT: 01/05/2019 10:54 ADDENDUM: Pathology revealed GRADE II INVASIVE MAMMARY CARCINOMA, MAMMARY CARCINOMA IN SITU of the Left breast, upper inner quadrant, 11 o'clock. This was found to be concordant by Dr. Peggye Fothergill. Pathology revealed FOCAL INVASIVE MAMMARY CARCINOMA, LOBULAR NEOPLASIA (ATYPICAL LOBULAR HYPERPLASIA) of the Left breast, upper inner quadrant, 11:30 o'clock. This was found to be concordant by Dr. Peggye Fothergill. Pathology results were discussed with the patient by telephone. The patient reported doing well after the biopsies with tenderness at the sites. Post biopsy instructions and care were reviewed and questions were answered. The patient was encouraged to call [facility] for any additional concerns. Surgical consultation has been arranged with Dr. Erroll Luna at [facility] on January 09, 2019. Pathology results reported by Terie Purser, RN on 01/05/2019. Electronically Signed By: Evangeline Dakin M.D. On: 01/05/2019 10:54  Addended by Evangeline Dakin, MD on 01/05/2019 1:02 PM  Study Result CLINICAL DATA: 78 year old with a screening detected approximate 1.0 cm mass associated with architectural distortion involving the UPPER INNER QUADRANT of the LEFT breast at the 11 o'clock position approximately 4 cm from. An adjacent satellite approximate 0.6 cm mass was identified on diagnostic workup at the 11:30 o'clock position approximately 4 cm from nipple. The adjacent masses are separated by 1 cm, and in total, the masses span 2.5 cm. EXAM: ULTRASOUND GUIDED LEFT BREAST CORE NEEDLE BIOPSY x 2 COMPARISON: Previous exam(s). FINDINGS: I met with the patient and we discussed the procedure of ultrasound-guided biopsy, including benefits and alternatives. We discussed the high likelihood of a successful procedure. We discussed the risks of the procedure, including infection, bleeding, tissue injury, clip migration, and inadequate sampling. Informed written consent was given. The usual time-out protocol was performed immediately prior to the procedure. # 1) Dominant 1.0 cm mass, lesion quadrant: UPPER INNER QUADRANT. Using sterile technique with chlorhexidine as skin antisepsis, 1% lidocaine and 1% lidocaine with epinephrine as local anesthetic, under direct ultrasound visualization, a 12 gauge Bard Marquee core needle device placed through an 11 gauge introducer needle was used to initially perform biopsy of the dominant 1 cm mass in the [place] of the LEFT breast using an inferomedial approach. At the conclusion of the procedure a ribbon shaped tissue marker clip was deployed into the biopsy cavity. # 2) Satellite 0.6 cm mass, lesion quadrant: UPPER INNER QUADRANT. Using the same dermatotomy site and prior local anesthetic placement, a 12 gauge Bard Marquee core needle device placed through an 11 gauge introducer needle was used to perform biopsy of the satellite 0.6 cm mass in the  UPPER INNER QUADRANT the  LEFT breast using an inferomedial approach. At the conclusion of the procedure a coil shaped tissue marker clip was deployed into the biopsy cavity. Follow up 2 view mammogram was performed and dictated separately. IMPRESSION: Ultrasound guided biopsy of 2 adjacent masses in the UPPER INNER QUADRANT of the LEFT breast. No apparent complications. Electronically Signed: By: Evangeline Dakin M.D. On: 01/04/2019 11:53   CLINICAL DATA: 78 year old female for further evaluation of possible LEFT breast mass/distortion on screening mammogram.  EXAM: DIGITAL DIAGNOSTIC LEFT MAMMOGRAM WITH TOMO  ULTRASOUND LEFT BREAST  COMPARISON: Previous exam(s).  ACR Breast Density Category b: There are scattered areas of fibroglandular density.  FINDINGS: 2D/3D spot compression views of the LEFT breast demonstrate a persistent irregular mass within the UPPER INNER LEFT breast.  Targeted ultrasound is performed, showing a 1 x 0.8 x 0.7 cm irregular hypoechoic mass at the 11 o'clock position of the LEFT breast 4 cm from the nipple and an adjacent 0.5 x 0.3 x 0.6 cm mass at the 11:30 position 4 cm from the nipple. These 2 masses are separated by a distance of 1 cm with equivocal hypoechoic material between these masses. These 2 masses encompass an area measuring 2.5 cm.  No abnormal LEFT axillary lymph nodes are noted.  IMPRESSION: 1. Suspicious adjacent 1 cm and 0.6 cm masses within UPPER INNER LEFT breast separated by a distance of 1 cm, with both masses encompassing an area measuring 2.5 cm. Tissue sampling of both of these masses recommended. 2. No abnormal LEFT axillary lymph nodes.  RECOMMENDATION: Ultrasound-guided biopsies of both UPPER INNER LEFT breast masses, which will be scheduled.  I have discussed the findings and recommendations with the patient. If applicable, a reminder letter will be sent to the patient regarding the next appointment.  BI-RADS CATEGORY 4:  Suspicious.              ADDITIONAL INFORMATION: E-cadherin is negative consistent with a lobular phenotype. Vicente Males MD Pathologist, Electronic Signature ( Signed 01/08/2019) 1. PROGNOSTIC INDICATORS Results: IMMUNOHISTOCHEMICAL AND MORPHOMETRIC ANALYSIS PERFORMED MANUALLY The tumor cells are NEGATIVE for Her2 (0). Estrogen Receptor: 100%, POSITIVE, STRONG STAINING INTENSITY Progesterone Receptor: 90%, POSITIVE, STRONG STAINING INTENSITY Proliferation Marker Ki67: 15% REFERENCE RANGE ESTROGEN RECEPTOR NEGATIVE 0% POSITIVE =>1% REFERENCE RANGE PROGESTERONE RECEPTOR NEGATIVE 0% POSITIVE =>1% All controls stained appropriately Enid Cutter MD Pathologist, Electronic Signature ( Signed 01/08/2019) FINAL DIAGNOSIS 1 of 3 FINAL for Ragin, Dylin ESTES ([id number]) Diagnosis 1. Breast, left, needle core biopsy, upper inner quadrant, 11 o'clock - INVASIVE MAMMARY CARCINOMA, SEE COMMENT. - MAMMARY CARCINOMA IN SITU. 2. Breast, left, needle core biopsy, LUIQ 11:30 o'clock - FOCAL INVASIVE MAMMARY CARCINOMA. - LOBULAR NEOPLASIA (ATYPICAL LOBULAR HYPERPLASIA). Microscopic Comment 1. The carcinoma appears grade 2 and measures 6 mm in greatest linear extent. The small focus in part 2 has similar morphology. E-cadherin will be ordered. Prognostic makers will be ordered. Dr. Lyndon Code has reviewed the case. The case was called to [facility] on 01/05/2019. Vicente Males MD Pathologist, Electronic Signature (Case signed 01/05/2019) Specimen.  The patient is a 78 year old female.   Past Surgical History  Appendectomy Colon Polyp Removal - Colonoscopy Hip Surgery Left. Oral Surgery Spinal Surgery - Lower Back Spinal Surgery - Neck Spinal Surgery Midback  Diagnostic Studies History Sharyn Lull R. 2 Colonoscopy within last year Mammogram within last year Pap Smear 1-5 years ago  Allergies  Morphine Sulfate *ANALGESICS -  OPIOID* Amoxicillin *PENICILLINS*  Medication History  Alendronate  Sodium (70MG Tablet, Oral) Active. amLODIPine Besylate (5MG Tablet, Oral) Active. Atomoxetine HCl (100MG Capsule, Oral) Active. cloNIDine HCl (0.1MG Tablet, Oral) Active. DULoxetine HCl (60MG Capsule DR Part, Oral) Active. Levothyroxine Sodium (125MCG Tablet, Oral) Active. Losartan Potassium (100MG Tablet, Oral) Active. Oxybutynin Chloride ER (10MG Tablet ER 24HR, Oral) Active. Aspirin (81MG Tablet, Oral) Active. Glucosamine Chondr 500 Complex (Oral) Active. Slow Iron (160 (50 Fe)MG Tablet ER, Oral) Active. Medications Reconciled  Social History  No alcohol use No caffeine use No drug use Tobacco use Former smoker.  Pregnancy / Birth History Age at menarche 70 years. Age of menopause [age] Gravida 6 Length (months) of breastfeeding 7-12 Maternal age 29-25 Para 69  Other Problems  Arthritis Breast Cancer Hemorrhoids High blood pressure Melanoma Pancreatitis Thyroid Disease     Review of Systems ( Gastrointestinal Present- Hemorrhoids. Not Present- Abdominal Pain, Bloating, Bloody Stool, Change in Bowel Habits, Chronic diarrhea, Constipation, Difficulty Swallowing, Excessive gas, Gets full quickly at meals, Indigestion, Nausea, Rectal Pain and Vomiting. Musculoskeletal Present- Back Pain and Joint Pain. Not Present- Joint Stiffness, Muscle Pain, Muscle Weakness and Swelling of Extremities. Neurological Present- Trouble walking. Not Present- Decreased Memory, Fainting, Headaches, Numbness, Seizures, Tingling, Tremor and Weakness.  Vitals   Weight: 123.25 lb Height: 63in Body Surface Area: 1.57 m Body Mass Index: 21.83 kg/m  Pulse: 102 (Regular)  BP: 126/70 (Sitting, Left Arm, Standard)        Physical Exam   General Mental Status-Alert. General Appearance-Consistent with stated age. Hydration-Well  hydrated. Voice-Normal.  Head and Neck Head-normocephalic, atraumatic with no lesions or palpable masses.  Breast Note: Left breast shows resulting upper quadrant without mass lesion. Left axilla normal. Small amount of crusting of left nipple which appears to be physiologic. Right breast normal.  Cardiovascular Note: Warm well perfused. Regular rate and rhythm  Neurologic Neurologic evaluation reveals -alert and oriented x 3 with no impairment of recent or remote memory. Mental Status-Normal.  Musculoskeletal Normal Exam - Left-Upper Extremity Strength Normal and Lower Extremity Strength Normal. Normal Exam - Right-Upper Extremity Strength Normal, Lower Extremity Weakness.  Lymphatic Note: No evidence of axillary adenopathy. His bilaterally. No cervical adenopathy. No supraclavicular adenopathy.    Assessment & Plan   BREAST CANCER, LEFT (C50.912) Impression: Discussed breast conservation versus mastectomy with reconstruction. Discussed sentinel lymph node mapping and the significance of this with respect to her breast cancer. The patient has opted for breast conservation. We'll schedule for left breast seed localized lumpectomy with 2 seeds to bracket the area. Discussed sentinel lymph node mapping the pros and cons of doing it at her age and significance of complication and or omission of it. Refer to medical and radiation oncology. Scheduled for left breast seed localized lumpectomy with left axillary sentinel lymph node mapping this patient and daughter wish to proceed with lymph node biopsy after discussion of potential complications and or usefulness given her disease and age  Current Plans Referred to Oncology, for evaluation and follow up (Oncology). Routine. Referred to Radiation Oncology, for evaluation and follow up (Radiation Oncology). Routine. Pt Education - [facility] Breast Cancer Information Given - [facility] "Breast Journey" Package You are being  scheduled for surgery- Our schedulers will call you.  You should hear from our office's scheduling department within 5 working days about the location, date, and time of surgery. We try to make accommodations for patient's preferences in scheduling surgery, but sometimes the OR schedule or the surgeon's schedule prevents [place] from making those accommodations.  If you have not  heard from our office [phone]) in 5 working days, call the office and ask for your surgeon's nurse.  If you have other questions about your diagnosis, plan, or surgery, call the office and ask for your surgeon's nurse.  We discussed the staging and pathophysiology of breast cancer. We discussed all of the different options for treatment for breast cancer including surgery, chemotherapy, radiation therapy, Herceptin, and antiestrogen therapy. We discussed a sentinel lymph node biopsy as she does not appear to having lymph node involvement right now. We discussed the performance of that with injection of radioactive tracer and blue dye. We discussed that she would have an incision underneath her axillary hairline. We discussed that there is a bout a 10-20% chance of having a positive node with a sentinel lymph node biopsy and we will await the permanent pathology to make any other first further decisions in terms of her treatment. One of these options might be to return to the operating room to perform an axillary lymph node dissection. We discussed about a 1-2% risk lifetime of chronic shoulder pain as well as lymphedema associated with a sentinel lymph node biopsy. We discussed the options for treatment of the breast cancer which included lumpectomy versus a mastectomy. We discussed the performance of the lumpectomy with a wire placement. We discussed a 10-20% chance of a positive margin requiring reexcision in the operating room. We also discussed that she may need radiation therapy or antiestrogen therapy or both if she  undergoes lumpectomy. We discussed the mastectomy and the postoperative care for that as well. We discussed that there is no difference in her survival whether she undergoes lumpectomy with radiation therapy or antiestrogen therapy versus a mastectomy. There is a slight difference in the local recurrence rate being 3-5% with lumpectomy and about 1% with a mastectomy. We discussed the risks of operation including bleeding, infection, possible reoperation. She understands her further therapy will be based on what her stages at the time of her operation.  Pt Education - [facility] breast cancer surgery: discussed with patient and provided information. Pt Education - [facility] Breast Biopsy HCI: discussed with patient and provided information.

## 2019-02-20 NOTE — Transfer of Care (Signed)
Immediate Anesthesia Transfer of Care Note  Patient: Rose [name]  Procedure(s) Performed: LEFT BREAST LUMPECTOMY X2 WITH RADIOACTIVE SEED X2 AND SENTINEL LYMPH NODE MAPPING (Left Breast)  Patient Location: PACU  Anesthesia Type:GA combined with regional for post-op pain  Level of Consciousness: drowsy and patient cooperative  Airway & Oxygen Therapy: Patient Spontanous Breathing and Patient connected to nasal cannula oxygen  Post-op Assessment: Report given to RN and Post -op Vital signs reviewed and stable  Post vital signs: Reviewed and stable  Last Vitals:  Vitals Value Taken Time  BP 173/73 02/20/19 1548  Temp    Pulse 68 02/20/19 1550  Resp 14 02/20/19 1550  SpO2 100 % 02/20/19 1550  Vitals shown include unvalidated device data.  Last Pain:  Vitals:   02/20/19 1252  TempSrc: Temporal  PainSc: 0-No pain      Patients Stated Pain Goal: 1 ([id number])  Complications: No apparent anesthesia complications

## 2019-02-20 NOTE — Discharge Instructions (Signed)
[facility] °Office Phone Number [phone] ° °BREAST BIOPSY/ PARTIAL MASTECTOMY: POST OP INSTRUCTIONS ° °Always review your discharge instruction sheet given to you by the facility where your surgery was performed. ° °IF YOU HAVE DISABILITY OR FAMILY LEAVE FORMS, YOU MUST BRING THEM TO THE OFFICE FOR PROCESSING.  DO NOT GIVE THEM TO YOUR DOCTOR. ° °1. A prescription for pain medication may be given to you upon discharge.  Take your pain medication as prescribed, if needed.  If narcotic pain medicine is not needed, then you may take acetaminophen (Tylenol) or ibuprofen (Advil) as needed. °2. Take your usually prescribed medications unless otherwise directed °3. If you need a refill on your pain medication, please contact your pharmacy.  They will contact our office to request authorization.  Prescriptions will not be filled after 5pm or on week-ends. °4. You should eat very light the first 24 hours after surgery, such as soup, crackers, pudding, etc.  Resume your normal diet the day after surgery. °5. Most patients will experience some swelling and bruising in the breast.  Ice packs and a good support bra will help.  Swelling and bruising can take several days to resolve.  °6. It is common to experience some constipation if taking pain medication after surgery.  Increasing fluid intake and taking a stool softener will usually help or prevent this problem from occurring.  A mild laxative (Milk of Magnesia or Miralax) should be taken according to package directions if there are no bowel movements after 48 hours. °7. Unless discharge instructions indicate otherwise, you may remove your bandages 24-48 hours after surgery, and you may shower at that time.  You may have steri-strips (small skin tapes) in place directly over the incision.  These strips should be left on the skin for 7-10 days.  If your surgeon used skin glue on the incision, you may shower in 24 hours.  The glue will flake off over the  next 2-3 weeks.  Any sutures or staples will be removed at the office during your follow-up visit. °8. ACTIVITIES:  You may resume regular daily activities (gradually increasing) beginning the next day.  Wearing a good support bra or sports bra minimizes pain and swelling.  You may have sexual intercourse when it is comfortable. °a. You may drive when you no longer are taking prescription pain medication, you can comfortably wear a seatbelt, and you can safely maneuver your car and apply brakes. °b. RETURN TO WORK:  ______________________________________________________________________________________ °9. You should see your doctor in the office for a follow-up appointment approximately two weeks after your surgery.  Your doctor’s nurse will typically make your follow-up appointment when she calls you with your pathology report.  Expect your pathology report 2-3 business days after your surgery.  You may call to check if you do not hear from us after three days. °10. OTHER INSTRUCTIONS: _______________________________________________________________________________________________ _____________________________________________________________________________________________________________________________________ °_____________________________________________________________________________________________________________________________________ °_____________________________________________________________________________________________________________________________________ ° °WHEN TO CALL YOUR DOCTOR: °1. Fever over 101.0 °2. Nausea and/or vomiting. °3. Extreme swelling or bruising. °4. Continued bleeding from incision. °5. Increased pain, redness, or drainage from the incision. ° °The clinic staff is available to answer your questions during regular business hours.  Please don’t hesitate to call and ask to speak to one of the nurses for clinical concerns.  If you have a medical emergency, go to the nearest  emergency room or call 911.  A surgeon from [facility] is always on call at the hospital. ° °For further questions, please visit [email or web address]  ° ° ° ° ° ° °  Post Anesthesia Home Care Instructions ° °Activity: °Get plenty of rest for the remainder of the day. A responsible individual must stay with you for 24 hours following the procedure.  °For the next 24 hours, DO NOT: °-Drive a car °-Operate machinery °-Drink alcoholic beverages °-Take any medication unless instructed by your physician °-Make any legal decisions or sign important papers. ° °Meals: °Start with liquid foods such as gelatin or soup. Progress to regular foods as tolerated. Avoid greasy, spicy, heavy foods. If nausea and/or vomiting occur, drink only clear liquids until the nausea and/or vomiting subsides. Call your physician if vomiting continues. ° °Special Instructions/Symptoms: °Your throat may feel dry or sore from the anesthesia or the breathing tube placed in your throat during surgery. If this causes discomfort, gargle with warm salt water. The discomfort should disappear within 24 hours. ° °If you had a scopolamine patch placed behind your ear for the management of post- operative nausea and/or vomiting: ° °1. The medication in the patch is effective for 72 hours, after which it should be removed.  Wrap patch in a tissue and discard in the trash. Wash hands thoroughly with soap and water. °2. You may remove the patch earlier than 72 hours if you experience unpleasant side effects which may include dry mouth, dizziness or visual disturbances. °3. Avoid touching the patch. Wash your hands with soap and water after contact with the patch. °  ° ° ° ° ° ° ° °Regional Anesthesia Blocks ° °1. Numbness or the inability to move the "blocked" extremity may last from 3-48 hours after placement. The length of time depends on the medication injected and your individual response to the medication. If the numbness is not going  away after 48 hours, call your surgeon. ° °2. The extremity that is blocked will need to be protected until the numbness is gone and the  Strength has returned. Because you cannot feel it, you will need to take extra care to avoid injury. Because it may be weak, you may have difficulty moving it or using it. You may not know what position it is in without looking at it while the block is in effect. ° °3. For blocks in the legs and feet, returning to weight bearing and walking needs to be done carefully. You will need to wait until the numbness is entirely gone and the strength has returned. You should be able to move your leg and foot normally before you try and bear weight or walk. You will need someone to be with you when you first try to ensure you do not fall and possibly risk injury. ° °4. Bruising and tenderness at the needle site are common side effects and will resolve in a few days. ° °5. Persistent numbness or new problems with movement should be communicated to the surgeon or the [facility] ([phone])/ [facility] ([phone]). °

## 2019-02-20 NOTE — Anesthesia Procedure Notes (Signed)
Procedure Name: LMA Insertion Date/Time: 02/20/2019 2:30 PM Performed by: Raenette Rover, CRNA Pre-anesthesia Checklist: Patient identified, Emergency Drugs available, Suction available and Patient being monitored Patient Re-evaluated:Patient Re-evaluated prior to induction Oxygen Delivery Method: Circle system utilized Preoxygenation: Pre-oxygenation with 100% oxygen Induction Type: IV induction LMA: LMA inserted LMA Size: 4.0 Number of attempts: 1 Placement Confirmation: positive ETCO2 and breath sounds checked- equal and bilateral Tube secured with: Tape Dental Injury: Teeth and Oropharynx as per pre-operative assessment

## 2019-02-20 NOTE — Anesthesia Preprocedure Evaluation (Signed)
Anesthesia Evaluation  Patient identified by MRN, date of birth, ID band Patient awake    Reviewed: Allergy & Precautions, NPO status , Patient's Chart, lab work & pertinent test results  Airway Mallampati: II  TM Distance: >3 FB Neck ROM: Full    Dental no notable dental hx. (+) Teeth Intact, Poor Dentition, Dental Advisory Given   Pulmonary former smoker,    Pulmonary exam normal breath sounds clear to auscultation       Cardiovascular Exercise Tolerance: Good hypertension, Pt. on medications Normal cardiovascular exam Rhythm:Regular Rate:Normal     Neuro/Psych PSYCHIATRIC DISORDERS    GI/Hepatic GERD  ,  Endo/Other  Hypothyroidism   Renal/GU K+ 4.4     Musculoskeletal   Abdominal   Peds  Hematology  (+) anemia , Hgb 10.9   Anesthesia Other Findings   Reproductive/Obstetrics                            Anesthesia Physical Anesthesia Plan  ASA: III  Anesthesia Plan: General   Post-op Pain Management:  Regional for Post-op pain   Induction:   PONV Risk Score and Plan: 2 and Treatment may vary due to age or medical condition and Ondansetron  Airway Management Planned: LMA  Additional Equipment: None  Intra-op Plan:   Post-operative Plan:   Informed Consent:     Dental advisory given  Plan Discussed with:   Anesthesia Plan Comments: (LMA w L [place] block)        Anesthesia Quick Evaluation

## 2019-02-21 ENCOUNTER — Ambulatory Visit: Payer: Medicare Other | Admitting: Hematology & Oncology

## 2019-02-21 ENCOUNTER — Encounter (HOSPITAL_BASED_OUTPATIENT_CLINIC_OR_DEPARTMENT_OTHER): Payer: Self-pay | Admitting: Surgery

## 2019-02-21 ENCOUNTER — Other Ambulatory Visit: Payer: Medicare Other

## 2019-02-21 ENCOUNTER — Ambulatory Visit: Payer: Medicare Other | Admitting: Hematology and Oncology

## 2019-02-22 ENCOUNTER — Other Ambulatory Visit: Payer: Self-pay

## 2019-02-23 LAB — SURGICAL PATHOLOGY

## 2019-02-26 ENCOUNTER — Ambulatory Visit: Payer: Medicare Other | Admitting: Hematology

## 2019-02-26 ENCOUNTER — Other Ambulatory Visit: Payer: Medicare Other

## 2019-03-07 ENCOUNTER — Other Ambulatory Visit: Payer: Medicare Other

## 2019-03-07 ENCOUNTER — Encounter: Payer: Self-pay | Admitting: Hematology & Oncology

## 2019-03-07 ENCOUNTER — Other Ambulatory Visit: Payer: Self-pay

## 2019-03-07 ENCOUNTER — Inpatient Hospital Stay: Payer: Medicare Other | Attending: Hematology and Oncology

## 2019-03-07 ENCOUNTER — Ambulatory Visit: Payer: Medicare Other | Admitting: Hematology & Oncology

## 2019-03-07 ENCOUNTER — Inpatient Hospital Stay: Payer: Medicare Other | Admitting: Hematology & Oncology

## 2019-03-07 VITALS — BP 116/60 | HR 78 | Temp 98.0°F | Resp 20 | Ht 63.0 in | Wt 120.8 lb

## 2019-03-07 DIAGNOSIS — C44309 Unspecified malignant neoplasm of skin of other parts of face: Secondary | ICD-10-CM

## 2019-03-07 DIAGNOSIS — C50212 Malignant neoplasm of upper-inner quadrant of left female breast: Secondary | ICD-10-CM | POA: Diagnosis not present

## 2019-03-07 LAB — CBC WITH DIFFERENTIAL (CANCER CENTER ONLY)
Abs Immature Granulocytes: 0.02 10*3/uL (ref 0.00–0.07)
Basophils Absolute: 0 10*3/uL (ref 0.0–0.1)
Basophils Relative: 0 %
Eosinophils Absolute: 0 10*3/uL (ref 0.0–0.5)
Eosinophils Relative: 1 %
HCT: 32.9 % — ABNORMAL LOW (ref 36.0–46.0)
Hemoglobin: 10.7 g/dL — ABNORMAL LOW (ref 12.0–15.0)
Immature Granulocytes: 0 %
Lymphocytes Relative: 38 %
Lymphs Abs: 2 10*3/uL (ref 0.7–4.0)
MCH: 32.2 pg (ref 26.0–34.0)
MCHC: 32.5 g/dL (ref 30.0–36.0)
MCV: 99.1 fL (ref 80.0–100.0)
Monocytes Absolute: 0.6 10*3/uL (ref 0.1–1.0)
Monocytes Relative: 10 %
Neutro Abs: 2.7 10*3/uL (ref 1.7–7.7)
Neutrophils Relative %: 51 %
Platelet Count: 181 10*3/uL (ref 150–400)
RBC: 3.32 MIL/uL — ABNORMAL LOW (ref 3.87–5.11)
RDW: 12.9 % (ref 11.5–15.5)
WBC Count: 5.4 10*3/uL (ref 4.0–10.5)
nRBC: 0 % (ref 0.0–0.2)

## 2019-03-07 LAB — CMP (CANCER CENTER ONLY)
ALT: 11 U/L (ref 0–44)
AST: 14 U/L — ABNORMAL LOW (ref 15–41)
Albumin: 4.1 g/dL (ref 3.5–5.0)
Alkaline Phosphatase: 48 U/L (ref 38–126)
Anion gap: 7 (ref 5–15)
BUN: 29 mg/dL — ABNORMAL HIGH (ref 8–23)
CO2: 28 mmol/L (ref 22–32)
Calcium: 10.4 mg/dL — ABNORMAL HIGH (ref 8.9–10.3)
Chloride: 108 mmol/L (ref 98–111)
Creatinine: 0.92 mg/dL (ref 0.44–1.00)
GFR, Est AFR Am: >60 mL/min (ref >60)
GFR, Estimated: 59 mL/min — ABNORMAL LOW (ref >60)
Glucose, Bld: 94 mg/dL (ref 70–99)
Potassium: 4.6 mmol/L (ref 3.5–5.1)
Sodium: 143 mmol/L (ref 135–145)
Total Bilirubin: 0.3 mg/dL (ref 0.3–1.2)
Total Protein: 6.9 g/dL (ref 6.5–8.1)

## 2019-03-10 ENCOUNTER — Other Ambulatory Visit: Payer: Self-pay | Admitting: Medical

## 2019-03-12 ENCOUNTER — Encounter: Payer: Self-pay | Admitting: *Deleted

## 2019-03-23 ENCOUNTER — Encounter: Payer: Self-pay | Admitting: *Deleted

## 2019-03-29 ENCOUNTER — Other Ambulatory Visit: Payer: Self-pay

## 2019-03-29 ENCOUNTER — Inpatient Hospital Stay: Payer: Medicare Other | Attending: Hematology and Oncology | Admitting: Hematology & Oncology

## 2019-03-29 DIAGNOSIS — C50212 Malignant neoplasm of upper-inner quadrant of left female breast: Secondary | ICD-10-CM | POA: Insufficient documentation

## 2019-03-29 DIAGNOSIS — Z170 Estrogen receptor positive status [ER+]: Secondary | ICD-10-CM | POA: Insufficient documentation

## 2019-03-29 DIAGNOSIS — Z780 Asymptomatic menopausal state: Secondary | ICD-10-CM | POA: Insufficient documentation

## 2019-03-29 MED ORDER — ALENDRONATE SODIUM 70 MG PO TABS: ORAL_TABLET | ORAL | 0 refills | Status: DC

## 2019-04-02 ENCOUNTER — Other Ambulatory Visit: Payer: Self-pay | Admitting: Medical

## 2019-04-06 DIAGNOSIS — M5136 Other intervertebral disc degeneration, lumbar region: Secondary | ICD-10-CM | POA: Diagnosis not present

## 2019-04-06 DIAGNOSIS — M5416 Radiculopathy, lumbar region: Secondary | ICD-10-CM | POA: Diagnosis not present

## 2019-04-09 ENCOUNTER — Other Ambulatory Visit: Payer: Self-pay

## 2019-04-09 ENCOUNTER — Other Ambulatory Visit: Payer: Self-pay | Admitting: Medical

## 2019-04-09 MED ORDER — OXYBUTYNIN CHLORIDE ER 10 MG PO TB24: 10.0000 mg | ORAL_TABLET | Freq: Every day | ORAL | 0 refills | Status: DC

## 2019-04-09 NOTE — Telephone Encounter (Signed)
Message sent to Rose [name] to call her and schedule her annual exam.

## 2019-04-13 ENCOUNTER — Other Ambulatory Visit: Payer: Self-pay

## 2019-04-13 ENCOUNTER — Inpatient Hospital Stay (HOSPITAL_BASED_OUTPATIENT_CLINIC_OR_DEPARTMENT_OTHER): Payer: Medicare Other | Admitting: Hematology & Oncology

## 2019-04-13 ENCOUNTER — Inpatient Hospital Stay: Payer: Medicare Other

## 2019-04-13 ENCOUNTER — Encounter: Payer: Self-pay | Admitting: Hematology & Oncology

## 2019-04-13 VITALS — BP 134/56 | HR 84 | Temp 97.8°F | Resp 18 | Ht 63.0 in | Wt 119.0 lb

## 2019-04-13 DIAGNOSIS — Z170 Estrogen receptor positive status [ER+]: Secondary | ICD-10-CM

## 2019-04-13 DIAGNOSIS — M19012 Primary osteoarthritis, left shoulder: Secondary | ICD-10-CM

## 2019-04-13 DIAGNOSIS — C50212 Malignant neoplasm of upper-inner quadrant of left female breast: Secondary | ICD-10-CM

## 2019-04-13 DIAGNOSIS — C44309 Unspecified malignant neoplasm of skin of other parts of face: Secondary | ICD-10-CM | POA: Diagnosis not present

## 2019-04-13 DIAGNOSIS — Z780 Asymptomatic menopausal state: Secondary | ICD-10-CM | POA: Diagnosis not present

## 2019-04-13 LAB — CBC WITH DIFFERENTIAL (CANCER CENTER ONLY)
Abs Immature Granulocytes: 0.01 10*3/uL (ref 0.00–0.07)
Basophils Absolute: 0 10*3/uL (ref 0.0–0.1)
Basophils Relative: 0 %
Eosinophils Absolute: 0.1 10*3/uL (ref 0.0–0.5)
Eosinophils Relative: 1 %
HCT: 34.4 % — ABNORMAL LOW (ref 36.0–46.0)
Hemoglobin: 11.3 g/dL — ABNORMAL LOW (ref 12.0–15.0)
Immature Granulocytes: 0 %
Lymphocytes Relative: 34 %
Lymphs Abs: 2.5 10*3/uL (ref 0.7–4.0)
MCH: 32.8 pg (ref 26.0–34.0)
MCHC: 32.8 g/dL (ref 30.0–36.0)
MCV: 100 fL (ref 80.0–100.0)
Monocytes Absolute: 0.5 10*3/uL (ref 0.1–1.0)
Monocytes Relative: 7 %
Neutro Abs: 4.1 10*3/uL (ref 1.7–7.7)
Neutrophils Relative %: 58 %
Platelet Count: 176 10*3/uL (ref 150–400)
RBC: 3.44 MIL/uL — ABNORMAL LOW (ref 3.87–5.11)
RDW: 12.7 % (ref 11.5–15.5)
WBC Count: 7.2 10*3/uL (ref 4.0–10.5)
nRBC: 0 % (ref 0.0–0.2)

## 2019-04-13 LAB — CMP (CANCER CENTER ONLY)
ALT: 9 U/L (ref 0–44)
AST: 11 U/L — ABNORMAL LOW (ref 15–41)
Albumin: 4.2 g/dL (ref 3.5–5.0)
Alkaline Phosphatase: 46 U/L (ref 38–126)
Anion gap: 6 (ref 5–15)
BUN: 37 mg/dL — ABNORMAL HIGH (ref 8–23)
CO2: 29 mmol/L (ref 22–32)
Calcium: 10.8 mg/dL — ABNORMAL HIGH (ref 8.9–10.3)
Chloride: 110 mmol/L (ref 98–111)
Creatinine: 0.9 mg/dL (ref 0.44–1.00)
GFR, Est AFR Am: >60 mL/min (ref >60)
GFR, Estimated: >60 mL/min (ref >60)
Glucose, Bld: 93 mg/dL (ref 70–99)
Potassium: 4.2 mmol/L (ref 3.5–5.1)
Sodium: 145 mmol/L (ref 135–145)
Total Bilirubin: 0.3 mg/dL (ref 0.3–1.2)
Total Protein: 6.9 g/dL (ref 6.5–8.1)

## 2019-04-14 LAB — CANCER ANTIGEN 27.29: CA 27.29: 13.7 U/mL (ref 0.0–38.6)

## 2019-04-16 LAB — LACTATE DEHYDROGENASE: LDH: 136 U/L (ref 98–192)

## 2019-04-16 MED ORDER — LETROZOLE 2.5 MG PO TABS: 2.5000 mg | ORAL_TABLET | Freq: Every day | ORAL | 12 refills | Status: DC

## 2019-04-16 NOTE — Progress Notes (Signed)
Hematology and Oncology Follow Up Visit  Rose [name] [id number] 22-Mar-1940 79 y.o. 04/16/2019   Principle Diagnosis:   Stage 1A ([id number]) Invasive lobular carcinoma of the LEFT breast -- ER+/PR+/HER2-  Current Therapy:    Lumpectomy on 02/22/2019  Femara 2.5 mg po q day -- start on 04/14/2019     Interim History:  Ms. Rose [name] is in for first office visit.  She lives in [place].  It is a lot easier for her to be seen at our office.  She has seen Dr. Lindi Adie.  As expected, his evaluation and work-up were quite thorough.  She underwent her lumpectomy back in December.  This was a left lobectomy.  The pathology report ([id number]) showed an invasive lobular carcinoma that was grade 2.  It measured 1.6 cm.  However, it was less than 1 mm from one of the margins.  The tumor was ER positive, PR positive, and HER-2 negative.  I do not believe there is any indication for a Oncotype test.  She had 4 sentinel lymph nodes that were all negative.  She is recovered quite nicely.  She has no pain in the left arm.  There is no numbness in the left tricep.  She has had no discharge.  She has had no problems with nausea or vomiting.  There is been no cough or shortness of breath.  She has had no change in bowel or bladder habits.  Overall, I would have to say that her performance status is probably ECOG 1.  She is from [place].  It was nice talking to her about [place].  She has been down in [place] probably for over 30 years.  There is no family history of breast cancer.  She was never on any antiestrogens..  She has I think for her 5 children.  She does not smoke.  I think she smoked probably 40 years ago.  Medications:  Current Outpatient Medications:  .  acetaminophen (TYLENOL) 325 MG tablet, Take 2 tablets (650 mg total) by mouth every 4 (four) hours as needed for moderate pain., Disp: 40 tablet, Rfl: 2 .  alendronate (FOSAMAX) 70 MG tablet, TAKE 1 TABLET BY MOUTH ONCE  WEEKLY BEFORE BREAKFAST, ON AN EMPTY STOMACH: REMAIN UPRIGHT FOR 30 MINUTES:TAKE WITH 8 OUNCES OF WATER, Disp: 4 tablet, Rfl: 0 .  amLODipine (NORVASC) 5 MG tablet, TAKE 1 TO 2 TABLETS BY MOUTH EVERY DAY, Disp: 60 tablet, Rfl: 0 .  aspirin 81 MG tablet, Take 81 mg by mouth daily. , Disp: , Rfl:  .  atomoxetine (STRATTERA) 100 MG capsule, Take 100 mg by mouth daily., Disp: , Rfl:  .  cloNIDine (CATAPRES) 0.1 MG tablet, TAKE ONE TABLET BY MOUTH THREE TIMES A DAY, Disp: 90 tablet, Rfl: 1 .  divalproex (DEPAKOTE ER) 500 MG 24 hr tablet, 500 mg at bedtime., Disp: , Rfl:  .  DULoxetine (CYMBALTA) 60 MG capsule, Take 1 capsule (60 mg total) by mouth daily., Disp: 90 capsule, Rfl: 3 .  ferrous sulfate dried (SLOW FE) 160 (50 FE) MG TBCR, Take 160 mg by mouth 2 (two) times daily. Patient has been taking Once Daily, Disp: , Rfl:  .  Glucosamine 500 MG TABS, Take 1 tablet by mouth daily. Glucosamine-Chondronton, Disp: , Rfl:  .  Lactulose 20 GM/30ML SOLN, Take 15 mLs (10 g total) by mouth 2 (two) times daily. Until having good bowel movements, then take once daily (Patient taking differently: Take 15 mLs by mouth daily. Until having  good bowel movements, then take once daily), Disp: 1892 mL, Rfl: 2 .  levothyroxine (SYNTHROID) 125 MCG tablet, TAKE ONE TABLET BY MOUTH DAILY, Disp: 90 tablet, Rfl: 1 .  losartan (COZAAR) 100 MG tablet, TAKE ONE TABLET BY MOUTH DAILY, Disp: 90 tablet, Rfl: 0 .  OLANZapine (ZYPREXA) 15 MG tablet, Take 15 mg by mouth at bedtime., Disp: , Rfl:  .  oxybutynin (DITROPAN-XL) 10 MG 24 hr tablet, Take 1 tablet (10 mg total) by mouth at bedtime., Disp: 30 tablet, Rfl: 0 .  PREVIDENT 5000 BOOSTER PLUS 1.1 % PSTE, daily., Disp: , Rfl:   Allergies:  Allergies  Allergen Reactions  . Amoxicillin     REACTION: Rash  DID THE REACTION INVOLVE: Swelling of the face/tongue/throat, SOB, or low BP? No Sudden or severe rash/hives, skin peeling, or the inside of the mouth or nose? Yes Did it  require medical treatment? No When did it last happen?1990s If all above answers are "NO", may proceed with cephalosporin use.   . Codeine Hives  . Hydrocodone-Acetaminophen     REACTION: itching  . Hydromorphone Hcl   . Morphine And Related Hives and Itching  . Morphine Sulfate     REACTION: unspecified  . Nsaids     Ulcer    Past Medical History, Surgical history, Social history, and Family History were reviewed and updated.  Review of Systems: Review of Systems  Constitutional: Negative.   HENT:  Negative.   Eyes: Negative.   Respiratory: Negative.   Cardiovascular: Negative.   Gastrointestinal: Negative.   Endocrine: Negative.   Genitourinary: Negative.    Musculoskeletal: Negative.   Skin: Negative.   Neurological: Negative.   Hematological: Negative.   Psychiatric/Behavioral: Negative.     Physical Exam:  height is 5' 3" (1.6 m) and weight is 119 lb (54 kg). Her temporal temperature is 97.8 F (36.6 C). Her blood pressure is 134/56 (abnormal) and her pulse is 84. Her respiration is 18 and oxygen saturation is 99%.   Wt Readings from Last 3 Encounters:  04/13/19 119 lb (54 kg)  03/07/19 120 lb 12.8 oz (54.8 kg)  02/20/19 121 lb 0.5 oz (54.9 kg)    Physical Exam Vitals reviewed.  Constitutional:      Comments: Breast exam shows right breast with no masses, edema or erythema.  There is no right axillary adenopathy.  Left breast shows well-healed lumpectomy scar at about the 2 o'clock position.  This is no tenderness.  There is no erythema.  There is no left axillary adenopathy.  Her sentinel node biopsy site is well-healed.  HENT:     Head: Normocephalic and atraumatic.  Eyes:     Pupils: Pupils are equal, round, and reactive to light.  Cardiovascular:     Rate and Rhythm: Normal rate and regular rhythm.     Heart sounds: Normal heart sounds.  Pulmonary:     Effort: Pulmonary effort is normal.     Breath sounds: Normal breath sounds.  Abdominal:      General: Bowel sounds are normal.     Palpations: Abdomen is soft.  Musculoskeletal:        General: No tenderness or deformity. Normal range of motion.     Cervical back: Normal range of motion.  Lymphadenopathy:     Cervical: No cervical adenopathy.  Skin:    General: Skin is warm and dry.     Findings: No erythema or rash.  Neurological:     Mental Status: She is alert  and oriented to person, place, and time.  Psychiatric:        Behavior: Behavior normal.        Thought Content: Thought content normal.        Judgment: Judgment normal.      Lab Results  Component Value Date   WBC 7.2 04/13/2019   HGB 11.3 (L) 04/13/2019   HCT 34.4 (L) 04/13/2019   MCV 100.0 04/13/2019   PLT 176 04/13/2019     Chemistry      Component Value Date/Time   NA 145 04/13/2019 1505   K 4.2 04/13/2019 1505   CL 110 04/13/2019 1505   CO2 29 04/13/2019 1505   BUN 37 (H) 04/13/2019 1505   CREATININE 0.90 04/13/2019 1505   CREATININE 1.05 (H) 04/07/2018 1510      Component Value Date/Time   CALCIUM 10.8 (H) 04/13/2019 1505   ALKPHOS 46 04/13/2019 1505   AST 11 (L) 04/13/2019 1505   ALT 9 04/13/2019 1505   BILITOT 0.3 04/13/2019 1505       Impression and Plan: Ms. Jaffee is an 79 year old postmenopausal female.  She has a early stage-stage Ia-lobular carcinoma of the left breast.  She underwent lumpectomy on the left side.  Typically, there would not be a role for radiation therapy in this situation.  However, there is a margin that is very close.  She does have a higher grade tumor.  As such, she might be candidate for radiation therapy.  We will speak with Dr. Lenard Galloway, at [facility], and see what she has to say.  I do think she will need aromatase inhibitor therapy.  We will go ahead and get her on Femara.  I think her outcome should be quite good.  I would think that the risk of recurrence should be less than 10%.  I do not see that we have to do any additional  studies on her.  We spent about 45 minutes together.  It was nice to talk with her.  She is definitely very eloquent.  She loves to read books.  We talked a lot about book reading.  I would like to see her back in about 6 weeks.  I want to make sure we follow-up with her to see that things are going okay.   Volanda Napoleon, MD 2/1/20214:52 PM

## 2019-04-18 ENCOUNTER — Other Ambulatory Visit: Payer: Self-pay | Admitting: Family

## 2019-04-18 DIAGNOSIS — C50212 Malignant neoplasm of upper-inner quadrant of left female breast: Secondary | ICD-10-CM

## 2019-04-18 DIAGNOSIS — Z170 Estrogen receptor positive status [ER+]: Secondary | ICD-10-CM

## 2019-04-26 ENCOUNTER — Other Ambulatory Visit: Payer: Self-pay | Admitting: Women's Health

## 2019-04-26 DIAGNOSIS — Z170 Estrogen receptor positive status [ER+]: Secondary | ICD-10-CM | POA: Diagnosis not present

## 2019-04-26 DIAGNOSIS — C50212 Malignant neoplasm of upper-inner quadrant of left female breast: Secondary | ICD-10-CM | POA: Diagnosis not present

## 2019-04-26 DIAGNOSIS — Z9889 Other specified postprocedural states: Secondary | ICD-10-CM | POA: Diagnosis not present

## 2019-04-26 NOTE — Telephone Encounter (Signed)
annual exam 05/07/19

## 2019-04-30 DIAGNOSIS — Z510 Encounter for antineoplastic radiation therapy: Secondary | ICD-10-CM | POA: Diagnosis not present

## 2019-04-30 DIAGNOSIS — Z170 Estrogen receptor positive status [ER+]: Secondary | ICD-10-CM | POA: Diagnosis not present

## 2019-04-30 DIAGNOSIS — C50212 Malignant neoplasm of upper-inner quadrant of left female breast: Secondary | ICD-10-CM | POA: Diagnosis not present

## 2019-05-01 DIAGNOSIS — M5414 Radiculopathy, thoracic region: Secondary | ICD-10-CM | POA: Diagnosis not present

## 2019-05-04 ENCOUNTER — Other Ambulatory Visit: Payer: Self-pay

## 2019-05-07 ENCOUNTER — Other Ambulatory Visit: Payer: Self-pay

## 2019-05-07 ENCOUNTER — Ambulatory Visit (INDEPENDENT_AMBULATORY_CARE_PROVIDER_SITE_OTHER): Payer: Medicare Other | Admitting: Obstetrics and Gynecology

## 2019-05-07 ENCOUNTER — Encounter: Payer: Self-pay | Admitting: Obstetrics and Gynecology

## 2019-05-07 ENCOUNTER — Ambulatory Visit: Payer: Medicare Other | Admitting: Hematology & Oncology

## 2019-05-07 ENCOUNTER — Inpatient Hospital Stay: Payer: Medicare Other

## 2019-05-07 VITALS — BP 118/76 | Ht 62.0 in | Wt 118.0 lb

## 2019-05-07 DIAGNOSIS — Z01419 Encounter for gynecological examination (general) (routine) without abnormal findings: Secondary | ICD-10-CM | POA: Diagnosis not present

## 2019-05-07 DIAGNOSIS — Z9289 Personal history of other medical treatment: Secondary | ICD-10-CM | POA: Diagnosis not present

## 2019-05-07 DIAGNOSIS — Z170 Estrogen receptor positive status [ER+]: Secondary | ICD-10-CM

## 2019-05-07 DIAGNOSIS — M810 Age-related osteoporosis without current pathological fracture: Secondary | ICD-10-CM

## 2019-05-07 DIAGNOSIS — C50912 Malignant neoplasm of unspecified site of left female breast: Secondary | ICD-10-CM

## 2019-05-07 NOTE — Progress Notes (Signed)
Rose [name] 1940/09/05 [id number]  SUBJECTIVE:  79 y.o. G6P6 female for annual routine gynecologic exam. She has no gynecologic concerns. She was recently diagnosed with left breast cancer, underwent lumpectomy in December, started on Femara and is starting radiation therapy next week.  Current Outpatient Medications  Medication Sig Dispense Refill  . acetaminophen (TYLENOL) 325 MG tablet Take 2 tablets (650 mg total) by mouth every 4 (four) hours as needed for moderate pain. 40 tablet 2  . alendronate (FOSAMAX) 70 MG tablet TAKE 1 TABLET BY MOUTH ONCE WEEKLY ON AN EMPTY STOMACH BEFORE BREAKFAST. REMAIN UPRIGHT FOR 30 MINUTES & TAKE WITH 8 OUNCES OF WATER 4 tablet 0  . amLODipine (NORVASC) 5 MG tablet TAKE 1 TO 2 TABLETS BY MOUTH EVERY DAY 60 tablet 0  . aspirin 81 MG tablet Take 81 mg by mouth daily.     Marland Kitchen atomoxetine (STRATTERA) 100 MG capsule Take 100 mg by mouth daily.    . cloNIDine (CATAPRES) 0.1 MG tablet TAKE ONE TABLET BY MOUTH THREE TIMES A DAY 90 tablet 1  . divalproex (DEPAKOTE ER) 500 MG 24 hr tablet 500 mg at bedtime.    . DULoxetine (CYMBALTA) 60 MG capsule Take 1 capsule (60 mg total) by mouth daily. 90 capsule 3  . ferrous sulfate dried (SLOW FE) 160 (50 FE) MG TBCR Take 160 mg by mouth 2 (two) times daily. Patient has been taking Once Daily    . Glucosamine 500 MG TABS Take 1 tablet by mouth daily. Glucosamine-Chondronton    . Lactulose 20 GM/30ML SOLN Take 15 mLs (10 g total) by mouth 2 (two) times daily. Until having good bowel movements, then take once daily (Patient taking differently: Take 15 mLs by mouth daily. Until having good bowel movements, then take once daily) 1892 mL 2  . letrozole (FEMARA) 2.5 MG tablet Take 1 tablet (2.5 mg total) by mouth daily. 30 tablet 12  . levothyroxine (SYNTHROID) 125 MCG tablet TAKE ONE TABLET BY MOUTH DAILY 90 tablet 1  . losartan (COZAAR) 100 MG tablet TAKE ONE TABLET BY MOUTH DAILY 90 tablet 0  . OLANZapine (ZYPREXA) 15 MG  tablet Take 15 mg by mouth at bedtime.    Marland Kitchen oxybutynin (DITROPAN-XL) 10 MG 24 hr tablet Take 1 tablet (10 mg total) by mouth at bedtime. 30 tablet 0  . PREVIDENT 5000 BOOSTER PLUS 1.1 % PSTE daily.     No current facility-administered medications for this visit.   Allergies: Amoxicillin, Codeine, Hydrocodone-acetaminophen, Hydromorphone hcl, Morphine and related, Morphine sulfate, and Nsaids  No LMP recorded. Patient is postmenopausal.  Past medical history,surgical history, problem list, medications, allergies, family history and social history were all reviewed and documented as reviewed in the EPIC chart.  ROS:  Feeling well. No dyspnea or chest pain on exertion.  No abdominal pain, change in bowel habits, black or bloody stools.  No urinary tract symptoms. GYN ROS: no abnormal bleeding, pelvic pain or discharge, no breast pain or new or enlarging lumps on self exam. No neurological complaints.    OBJECTIVE:  BP 118/76   Ht '5\' 2"'  (1.575 m)   Wt 118 lb (53.5 kg)   BMI 21.58 kg/m  The patient appears well, alert, oriented x 3, in no distress. ENT normal.  Neck supple. No cervical or supraclavicular adenopathy or thyromegaly.  Lungs are clear, good air entry, no wheezes, rhonchi or rales. S1 and S2 normal, no murmurs, regular rate and rhythm.  Abdomen soft without tenderness, guarding, mass  or organomegaly.  Neurological is normal, no focal findings.  BREAST EXAM: breasts appear normal, no suspicious masses, no skin or nipple changes or axillary nodes.  Left lumpectomy incision well-healed.  PELVIC EXAM: VULVA: normal appearing vulva with atrophic changes and with no masses, tenderness or lesions, 'parchment paper' white changes around the bilateral labia minora, perineum, and perianal area in the classic figure-of-eight appearance, VAGINA: normal appearing vagina with normal color and discharge, no lesions, CERVIX: normal appearing cervix without discharge or lesions, UTERUS: uterus is  normal size, shape, consistency and nontender, ADNEXA: normal adnexa in size, nontender and no masses, RECTAL: normal rectal, no masses  Chaperone: Caryn Bee present during the examination  ASSESSMENT:  79 y.o. G6P6 here for annual gynecologic exam  PLAN:   1. Postmenopausal.  No concerns, no vaginal bleeding, no hot flashes.  I described the vulvar and perianal skin changes that may be suggestive of development of lichen sclerosis but she is asymptomatic at this time.  She will keep an eye out for development of symptoms and let us know. 2. Pap smear 03/2017 normal.  Next Pap smear due 2022 if she desires to continue screening, will readdress at the next appointment. 3. Recent left breast cancer diagnosis and treatment.  Stage 1A Invasive lobular carcinoma of the left breast -- ER+/PR+/HER2-.  Lumpectomy 02/22/2019.  Started Femara at end of 03/2019 and will be starting radiation therapy soon.  Managed by Oncology, recently saw Dr. Marin Olp.  Normal breast exam today.  4. Colonoscopy 2020.  Recommended that she follow up at the recommended interval.   5. DEXA 2017.  T score -2.9.  Started taking alendronate in 2019 and tolerating fine.  No refill needed right now.  Next DEXA recommended asap so she plans to schedule this when able to. 6. Health maintenance.  No labs today as she normally has these completed with her primary care provider.   Return annually or sooner, prn.  Joseph Pierini MD  05/07/19

## 2019-05-07 NOTE — Patient Instructions (Signed)
We will plan to repeat the DEXA/bone density scan as soon as possible.  Continue weight bearing exercise, and vitamin D/calcium intake.

## 2019-05-08 ENCOUNTER — Other Ambulatory Visit: Payer: Self-pay | Admitting: Women's Health

## 2019-05-08 ENCOUNTER — Other Ambulatory Visit: Payer: Self-pay | Admitting: Medical

## 2019-05-08 DIAGNOSIS — C50212 Malignant neoplasm of upper-inner quadrant of left female breast: Secondary | ICD-10-CM | POA: Diagnosis not present

## 2019-05-08 DIAGNOSIS — Z170 Estrogen receptor positive status [ER+]: Secondary | ICD-10-CM | POA: Diagnosis not present

## 2019-05-08 DIAGNOSIS — Z510 Encounter for antineoplastic radiation therapy: Secondary | ICD-10-CM | POA: Diagnosis not present

## 2019-05-09 ENCOUNTER — Other Ambulatory Visit: Payer: Self-pay | Admitting: Medical

## 2019-05-09 NOTE — Telephone Encounter (Signed)
Rx was prescribed Rx at 03/18/17 visit "Urge incontinence"

## 2019-05-10 DIAGNOSIS — Z510 Encounter for antineoplastic radiation therapy: Secondary | ICD-10-CM | POA: Diagnosis not present

## 2019-05-10 DIAGNOSIS — Z170 Estrogen receptor positive status [ER+]: Secondary | ICD-10-CM | POA: Diagnosis not present

## 2019-05-10 DIAGNOSIS — C50212 Malignant neoplasm of upper-inner quadrant of left female breast: Secondary | ICD-10-CM | POA: Diagnosis not present

## 2019-05-14 DIAGNOSIS — C50212 Malignant neoplasm of upper-inner quadrant of left female breast: Secondary | ICD-10-CM | POA: Diagnosis not present

## 2019-05-14 DIAGNOSIS — Z510 Encounter for antineoplastic radiation therapy: Secondary | ICD-10-CM | POA: Diagnosis not present

## 2019-05-14 DIAGNOSIS — Z170 Estrogen receptor positive status [ER+]: Secondary | ICD-10-CM | POA: Diagnosis not present

## 2019-05-15 DIAGNOSIS — Z510 Encounter for antineoplastic radiation therapy: Secondary | ICD-10-CM | POA: Diagnosis not present

## 2019-05-15 DIAGNOSIS — Z170 Estrogen receptor positive status [ER+]: Secondary | ICD-10-CM | POA: Diagnosis not present

## 2019-05-15 DIAGNOSIS — C50212 Malignant neoplasm of upper-inner quadrant of left female breast: Secondary | ICD-10-CM | POA: Diagnosis not present

## 2019-05-16 DIAGNOSIS — Z510 Encounter for antineoplastic radiation therapy: Secondary | ICD-10-CM | POA: Diagnosis not present

## 2019-05-16 DIAGNOSIS — Z170 Estrogen receptor positive status [ER+]: Secondary | ICD-10-CM | POA: Diagnosis not present

## 2019-05-16 DIAGNOSIS — C50212 Malignant neoplasm of upper-inner quadrant of left female breast: Secondary | ICD-10-CM | POA: Diagnosis not present

## 2019-05-17 ENCOUNTER — Ambulatory Visit: Payer: Medicare Other | Attending: Internal Medicine

## 2019-05-17 DIAGNOSIS — Z510 Encounter for antineoplastic radiation therapy: Secondary | ICD-10-CM | POA: Diagnosis not present

## 2019-05-17 DIAGNOSIS — Z170 Estrogen receptor positive status [ER+]: Secondary | ICD-10-CM | POA: Diagnosis not present

## 2019-05-17 DIAGNOSIS — C50212 Malignant neoplasm of upper-inner quadrant of left female breast: Secondary | ICD-10-CM | POA: Diagnosis not present

## 2019-05-17 DIAGNOSIS — Z23 Encounter for immunization: Secondary | ICD-10-CM

## 2019-05-17 NOTE — Progress Notes (Signed)
   Covid-19 Vaccination Clinic  Name:  JAYDY AZCARATE    MRN: [id number] DOB: 21-Sep-1940  05/17/2019  Ms. Leidecker was observed post Covid-19 immunization for 15 minutes without incident. She was provided with Vaccine Information Sheet and instruction to access the V-Safe system.   Ms. Carmouche was instructed to call 911 with any severe reactions post vaccine: Marland Kitchen Difficulty breathing  . Swelling of face and throat  . A fast heartbeat  . A bad rash all over body  . Dizziness and weakness   Immunizations Administered    Name Date Dose VIS Date Route   Pfizer COVID-19 Vaccine 05/17/2019 10:47 AM 0.3 mL 02/23/2019 Intramuscular   Manufacturer: [place]   Lot: [id number]   [place]: [id number]

## 2019-05-18 DIAGNOSIS — Z170 Estrogen receptor positive status [ER+]: Secondary | ICD-10-CM | POA: Diagnosis not present

## 2019-05-18 DIAGNOSIS — Z510 Encounter for antineoplastic radiation therapy: Secondary | ICD-10-CM | POA: Diagnosis not present

## 2019-05-18 DIAGNOSIS — C50212 Malignant neoplasm of upper-inner quadrant of left female breast: Secondary | ICD-10-CM | POA: Diagnosis not present

## 2019-05-21 DIAGNOSIS — Z170 Estrogen receptor positive status [ER+]: Secondary | ICD-10-CM | POA: Diagnosis not present

## 2019-05-21 DIAGNOSIS — C50212 Malignant neoplasm of upper-inner quadrant of left female breast: Secondary | ICD-10-CM | POA: Diagnosis not present

## 2019-05-21 DIAGNOSIS — Z510 Encounter for antineoplastic radiation therapy: Secondary | ICD-10-CM | POA: Diagnosis not present

## 2019-05-22 DIAGNOSIS — Z170 Estrogen receptor positive status [ER+]: Secondary | ICD-10-CM | POA: Diagnosis not present

## 2019-05-22 DIAGNOSIS — Z510 Encounter for antineoplastic radiation therapy: Secondary | ICD-10-CM | POA: Diagnosis not present

## 2019-05-22 DIAGNOSIS — C50212 Malignant neoplasm of upper-inner quadrant of left female breast: Secondary | ICD-10-CM | POA: Diagnosis not present

## 2019-05-23 DIAGNOSIS — R479 Unspecified speech disturbances: Secondary | ICD-10-CM | POA: Diagnosis not present

## 2019-05-23 DIAGNOSIS — I129 Hypertensive chronic kidney disease with stage 1 through stage 4 chronic kidney disease, or unspecified chronic kidney disease: Secondary | ICD-10-CM | POA: Diagnosis not present

## 2019-05-23 DIAGNOSIS — J449 Chronic obstructive pulmonary disease, unspecified: Secondary | ICD-10-CM | POA: Diagnosis not present

## 2019-05-23 DIAGNOSIS — N189 Chronic kidney disease, unspecified: Secondary | ICD-10-CM | POA: Diagnosis not present

## 2019-05-23 DIAGNOSIS — R410 Disorientation, unspecified: Secondary | ICD-10-CM | POA: Diagnosis not present

## 2019-05-23 DIAGNOSIS — R4182 Altered mental status, unspecified: Secondary | ICD-10-CM | POA: Diagnosis not present

## 2019-05-23 DIAGNOSIS — G459 Transient cerebral ischemic attack, unspecified: Secondary | ICD-10-CM | POA: Diagnosis not present

## 2019-05-23 DIAGNOSIS — F329 Major depressive disorder, single episode, unspecified: Secondary | ICD-10-CM | POA: Diagnosis not present

## 2019-05-23 DIAGNOSIS — C50919 Malignant neoplasm of unspecified site of unspecified female breast: Secondary | ICD-10-CM | POA: Diagnosis not present

## 2019-05-23 DIAGNOSIS — H532 Diplopia: Secondary | ICD-10-CM | POA: Diagnosis not present

## 2019-05-23 DIAGNOSIS — I440 Atrioventricular block, first degree: Secondary | ICD-10-CM | POA: Diagnosis not present

## 2019-05-23 DIAGNOSIS — C50212 Malignant neoplasm of upper-inner quadrant of left female breast: Secondary | ICD-10-CM | POA: Diagnosis not present

## 2019-05-25 DIAGNOSIS — F330 Major depressive disorder, recurrent, mild: Secondary | ICD-10-CM | POA: Diagnosis not present

## 2019-05-25 DIAGNOSIS — I10 Essential (primary) hypertension: Secondary | ICD-10-CM | POA: Diagnosis not present

## 2019-05-25 DIAGNOSIS — R410 Disorientation, unspecified: Secondary | ICD-10-CM | POA: Diagnosis not present

## 2019-05-25 DIAGNOSIS — N189 Chronic kidney disease, unspecified: Secondary | ICD-10-CM | POA: Diagnosis not present

## 2019-05-25 DIAGNOSIS — C50919 Malignant neoplasm of unspecified site of unspecified female breast: Secondary | ICD-10-CM | POA: Diagnosis not present

## 2019-05-25 DIAGNOSIS — I440 Atrioventricular block, first degree: Secondary | ICD-10-CM | POA: Diagnosis not present

## 2019-05-25 DIAGNOSIS — Z510 Encounter for antineoplastic radiation therapy: Secondary | ICD-10-CM | POA: Diagnosis not present

## 2019-05-25 DIAGNOSIS — C50212 Malignant neoplasm of upper-inner quadrant of left female breast: Secondary | ICD-10-CM | POA: Diagnosis not present

## 2019-05-25 DIAGNOSIS — Z170 Estrogen receptor positive status [ER+]: Secondary | ICD-10-CM | POA: Diagnosis not present

## 2019-05-25 DIAGNOSIS — J449 Chronic obstructive pulmonary disease, unspecified: Secondary | ICD-10-CM | POA: Diagnosis not present

## 2019-05-25 DIAGNOSIS — H532 Diplopia: Secondary | ICD-10-CM | POA: Diagnosis not present

## 2019-05-25 DIAGNOSIS — I6523 Occlusion and stenosis of bilateral carotid arteries: Secondary | ICD-10-CM | POA: Diagnosis not present

## 2019-05-25 DIAGNOSIS — G459 Transient cerebral ischemic attack, unspecified: Secondary | ICD-10-CM | POA: Diagnosis not present

## 2019-05-25 DIAGNOSIS — E079 Disorder of thyroid, unspecified: Secondary | ICD-10-CM | POA: Diagnosis not present

## 2019-05-25 DIAGNOSIS — Z853 Personal history of malignant neoplasm of breast: Secondary | ICD-10-CM | POA: Diagnosis not present

## 2019-05-25 DIAGNOSIS — R479 Unspecified speech disturbances: Secondary | ICD-10-CM | POA: Diagnosis not present

## 2019-05-25 DIAGNOSIS — R413 Other amnesia: Secondary | ICD-10-CM | POA: Diagnosis not present

## 2019-05-26 DIAGNOSIS — Z7983 Long term (current) use of bisphosphonates: Secondary | ICD-10-CM | POA: Diagnosis not present

## 2019-05-26 DIAGNOSIS — Z79811 Long term (current) use of aromatase inhibitors: Secondary | ICD-10-CM | POA: Diagnosis not present

## 2019-05-26 DIAGNOSIS — Z85828 Personal history of other malignant neoplasm of skin: Secondary | ICD-10-CM | POA: Diagnosis not present

## 2019-05-26 DIAGNOSIS — R413 Other amnesia: Secondary | ICD-10-CM | POA: Diagnosis not present

## 2019-05-26 DIAGNOSIS — Z96642 Presence of left artificial hip joint: Secondary | ICD-10-CM | POA: Diagnosis present

## 2019-05-26 DIAGNOSIS — N189 Chronic kidney disease, unspecified: Secondary | ICD-10-CM | POA: Diagnosis present

## 2019-05-26 DIAGNOSIS — R278 Other lack of coordination: Secondary | ICD-10-CM | POA: Diagnosis not present

## 2019-05-26 DIAGNOSIS — Z7982 Long term (current) use of aspirin: Secondary | ICD-10-CM | POA: Diagnosis not present

## 2019-05-26 DIAGNOSIS — Z87891 Personal history of nicotine dependence: Secondary | ICD-10-CM | POA: Diagnosis not present

## 2019-05-26 DIAGNOSIS — I10 Essential (primary) hypertension: Secondary | ICD-10-CM | POA: Diagnosis not present

## 2019-05-26 DIAGNOSIS — G459 Transient cerebral ischemic attack, unspecified: Secondary | ICD-10-CM | POA: Diagnosis not present

## 2019-05-26 DIAGNOSIS — I129 Hypertensive chronic kidney disease with stage 1 through stage 4 chronic kidney disease, or unspecified chronic kidney disease: Secondary | ICD-10-CM | POA: Diagnosis present

## 2019-05-26 DIAGNOSIS — Z9049 Acquired absence of other specified parts of digestive tract: Secondary | ICD-10-CM | POA: Diagnosis not present

## 2019-05-26 DIAGNOSIS — Z170 Estrogen receptor positive status [ER+]: Secondary | ICD-10-CM | POA: Diagnosis not present

## 2019-05-26 DIAGNOSIS — J449 Chronic obstructive pulmonary disease, unspecified: Secondary | ICD-10-CM | POA: Diagnosis present

## 2019-05-26 DIAGNOSIS — Z20822 Contact with and (suspected) exposure to covid-19: Secondary | ICD-10-CM | POA: Diagnosis present

## 2019-05-26 DIAGNOSIS — R4789 Other speech disturbances: Secondary | ICD-10-CM | POA: Diagnosis not present

## 2019-05-26 DIAGNOSIS — I272 Pulmonary hypertension, unspecified: Secondary | ICD-10-CM | POA: Diagnosis not present

## 2019-05-26 DIAGNOSIS — R471 Dysarthria and anarthria: Secondary | ICD-10-CM | POA: Diagnosis present

## 2019-05-26 DIAGNOSIS — E039 Hypothyroidism, unspecified: Secondary | ICD-10-CM | POA: Diagnosis present

## 2019-05-26 DIAGNOSIS — Z853 Personal history of malignant neoplasm of breast: Secondary | ICD-10-CM | POA: Diagnosis not present

## 2019-05-26 DIAGNOSIS — Z79899 Other long term (current) drug therapy: Secondary | ICD-10-CM | POA: Diagnosis not present

## 2019-05-26 DIAGNOSIS — R29702 NIHSS score 2: Secondary | ICD-10-CM | POA: Diagnosis not present

## 2019-05-26 DIAGNOSIS — M1812 Unilateral primary osteoarthritis of first carpometacarpal joint, left hand: Secondary | ICD-10-CM | POA: Diagnosis present

## 2019-05-26 DIAGNOSIS — I361 Nonrheumatic tricuspid (valve) insufficiency: Secondary | ICD-10-CM | POA: Diagnosis not present

## 2019-05-26 DIAGNOSIS — R4182 Altered mental status, unspecified: Secondary | ICD-10-CM | POA: Diagnosis present

## 2019-05-26 DIAGNOSIS — C50212 Malignant neoplasm of upper-inner quadrant of left female breast: Secondary | ICD-10-CM | POA: Diagnosis not present

## 2019-05-26 DIAGNOSIS — F329 Major depressive disorder, single episode, unspecified: Secondary | ICD-10-CM | POA: Diagnosis present

## 2019-05-26 DIAGNOSIS — Z981 Arthrodesis status: Secondary | ICD-10-CM | POA: Diagnosis not present

## 2019-05-26 DIAGNOSIS — Z510 Encounter for antineoplastic radiation therapy: Secondary | ICD-10-CM | POA: Diagnosis not present

## 2019-05-26 DIAGNOSIS — R479 Unspecified speech disturbances: Secondary | ICD-10-CM | POA: Diagnosis not present

## 2019-05-26 DIAGNOSIS — R410 Disorientation, unspecified: Secondary | ICD-10-CM | POA: Diagnosis not present

## 2019-05-28 ENCOUNTER — Other Ambulatory Visit: Payer: Self-pay | Admitting: Obstetrics and Gynecology

## 2019-05-28 MED ORDER — ENOXAPARIN SODIUM 40 MG/0.4ML ~~LOC~~ SOLN
40.00 | SUBCUTANEOUS | Status: DC
Start: 2019-05-30 — End: 2019-05-28

## 2019-05-28 MED ORDER — PREGABALIN 75 MG PO CAPS
150.00 | ORAL_CAPSULE | ORAL | Status: DC
Start: 2019-05-30 — End: 2019-05-28

## 2019-05-28 MED ORDER — ONDANSETRON HCL 4 MG/2ML IJ SOLN
4.00 | INTRAMUSCULAR | Status: DC
Start: ? — End: 2019-05-28

## 2019-05-28 MED ORDER — OXYBUTYNIN CHLORIDE ER 5 MG PO TB24
10.00 | ORAL_TABLET | ORAL | Status: DC
Start: 2019-05-31 — End: 2019-05-28

## 2019-05-28 MED ORDER — CHOLECALCIFEROL 25 MCG (1000 UT) PO TABS
1000.00 | ORAL_TABLET | ORAL | Status: DC
Start: 2019-05-31 — End: 2019-05-28

## 2019-05-28 MED ORDER — MELATONIN 3 MG PO TABS
6.00 | ORAL_TABLET | ORAL | Status: DC
Start: ? — End: 2019-05-28

## 2019-05-28 MED ORDER — LEVOTHYROXINE SODIUM 125 MCG PO TABS
125.00 | ORAL_TABLET | ORAL | Status: DC
Start: 2019-05-31 — End: 2019-05-28

## 2019-05-28 MED ORDER — ACETAMINOPHEN 325 MG PO TABS
650.00 | ORAL_TABLET | ORAL | Status: DC
Start: ? — End: 2019-05-28

## 2019-05-28 MED ORDER — CLONIDINE HCL 0.1 MG PO TABS
0.10 | ORAL_TABLET | ORAL | Status: DC
Start: 2019-05-30 — End: 2019-05-28

## 2019-05-28 MED ORDER — ASPIRIN 81 MG PO TBEC
81.00 | DELAYED_RELEASE_TABLET | ORAL | Status: DC
Start: 2019-05-31 — End: 2019-05-28

## 2019-05-28 MED ORDER — AMLODIPINE BESYLATE 5 MG PO TABS
5.00 | ORAL_TABLET | ORAL | Status: DC
Start: 2019-05-30 — End: 2019-05-28

## 2019-05-28 MED ORDER — CLOPIDOGREL BISULFATE 75 MG PO TABS
75.00 | ORAL_TABLET | ORAL | Status: DC
Start: 2019-05-31 — End: 2019-05-28

## 2019-05-28 MED ORDER — ATORVASTATIN CALCIUM 40 MG PO TABS
40.00 | ORAL_TABLET | ORAL | Status: DC
Start: 2019-05-30 — End: 2019-05-28

## 2019-05-28 MED ORDER — LOSARTAN POTASSIUM 50 MG PO TABS
100.00 | ORAL_TABLET | ORAL | Status: DC
Start: 2019-05-31 — End: 2019-05-28

## 2019-05-28 MED ORDER — FERROUS SULFATE 325 (65 FE) MG PO TABS
325.00 | ORAL_TABLET | ORAL | Status: DC
Start: 2019-05-31 — End: 2019-05-28

## 2019-05-29 DIAGNOSIS — C50212 Malignant neoplasm of upper-inner quadrant of left female breast: Secondary | ICD-10-CM | POA: Diagnosis not present

## 2019-05-29 DIAGNOSIS — Z510 Encounter for antineoplastic radiation therapy: Secondary | ICD-10-CM | POA: Diagnosis not present

## 2019-05-30 ENCOUNTER — Telehealth: Payer: Self-pay

## 2019-05-30 NOTE — Telephone Encounter (Signed)
faxed

## 2019-05-30 NOTE — Telephone Encounter (Signed)
Patient nurse Burlene Arnt  called from [facility] and they need a recent vaccine shot records,  History of recent physical and the most recent office visit notes.Please call Tabatha at   [phone] if there is any question. Please fax the following information to: [phone]  Thanks,

## 2019-05-31 ENCOUNTER — Non-Acute Institutional Stay (SKILLED_NURSING_FACILITY): Payer: Medicare Other | Admitting: Adult Health

## 2019-05-31 ENCOUNTER — Encounter: Payer: Self-pay | Admitting: Adult Health

## 2019-05-31 DIAGNOSIS — M810 Age-related osteoporosis without current pathological fracture: Secondary | ICD-10-CM

## 2019-05-31 DIAGNOSIS — G459 Transient cerebral ischemic attack, unspecified: Secondary | ICD-10-CM

## 2019-05-31 DIAGNOSIS — Z170 Estrogen receptor positive status [ER+]: Secondary | ICD-10-CM

## 2019-05-31 DIAGNOSIS — F909 Attention-deficit hyperactivity disorder, unspecified type: Secondary | ICD-10-CM | POA: Diagnosis not present

## 2019-05-31 DIAGNOSIS — G609 Hereditary and idiopathic neuropathy, unspecified: Secondary | ICD-10-CM

## 2019-05-31 DIAGNOSIS — G458 Other transient cerebral ischemic attacks and related syndromes: Secondary | ICD-10-CM | POA: Diagnosis not present

## 2019-05-31 DIAGNOSIS — I701 Atherosclerosis of renal artery: Secondary | ICD-10-CM

## 2019-05-31 DIAGNOSIS — E039 Hypothyroidism, unspecified: Secondary | ICD-10-CM

## 2019-05-31 DIAGNOSIS — I10 Essential (primary) hypertension: Secondary | ICD-10-CM

## 2019-05-31 DIAGNOSIS — J449 Chronic obstructive pulmonary disease, unspecified: Secondary | ICD-10-CM

## 2019-05-31 DIAGNOSIS — C50212 Malignant neoplasm of upper-inner quadrant of left female breast: Secondary | ICD-10-CM

## 2019-05-31 DIAGNOSIS — I700 Atherosclerosis of aorta: Secondary | ICD-10-CM

## 2019-05-31 MED ORDER — PREGABALIN 150 MG PO CAPS: 150.0000 mg | ORAL_CAPSULE | Freq: Two times a day (BID) | ORAL | 1 refills | Status: DC

## 2019-05-31 NOTE — Progress Notes (Addendum)
Location:  Occupational psychologist of Service:  SNF (31) Provider:   Cindi Carbon, [place] [phone]  [place]ier, Percell Miller, PA-C  Patient Care Team: Elise Benne as PCP - General (Internal Medicine) Lyndal Pulley, DO as Attending Physician (Sports Medicine) Rockwell Germany, RN as Oncology Nurse Navigator Mauro Kaufmann, RN as Oncology Nurse Navigator  Extended Emergency Contact Information Primary Emergency Contact: Natasha Bence, [place] Phone: [phone] Relation: Daughter  Code Status:  Full code, obtaining records  Goals of care: Advanced Directive information Advanced Directives 04/13/2019  Does Patient Have a Medical Advance Directive? Yes  Type of Paramedic of [place];Living will  Does patient want to make changes to medical advance directive? No - Patient declined  Copy of [place] in Chart? -  Would patient like information on creating a medical advance directive? -     Chief Complaint  Patient presents with  . Hospitalization Follow-up    HPI:  Pt is a 79 y.o. female seen today for a hospital f/u s/p admission from [facility] 05/25/19-05/29/19 due to increased weakness, confusion, and slurred speech. She previously lived at home independently and is now at [facility] for rehab services. She reports that she would like to return home to [place] [facility]. PMH significant for ADD, alcoholism, former smoker with COPD, carotid bruit, spinal fusion, PUD HTN, subclavian steal syndrome, carotid artery disease, renal artery stenosis s/p angioplasty, urinary incontinence, breast cancer on the left due to estrogen receptor positive s/p lumpectomy with radioactive seed/sentinal node bx and currently undergoing radiation, peripheral neuropathy, htn, and  Hypothyroidism.  She reports that she fell at home and was  confused with slurred speech but does not remember any further details. During her hospital stay a CTA of the head and neck showed atherosclerosis but no LVO.  Echo with normal EF and no shunt. A1C was 5.2, LDL 104, and other labs were WNL. She was diagnosed with a TIA and placed on plavix with aspirin. She reports that she is back to normal but slightly weak and is here to received physical therapy. She is using a cane at home but we provided a walker for her gait. She does not have any complaints at this time and vitals are WNL.   Of note she was placed on Femara by Dr. Marin Olp for breast ca in Jan but this was not on the [facility] at discharge from the hospital.   She reports chronic frequency and incontinence for which she takes ditropan and vesicare.   Reports a life long hx of ADD and is on Strattera. She reports she sees a Designer, jewellery that manages this and in review of epic she has been on many different regimens  She has had 1 Pfizer covid vaccine 05/17/19  Past Medical History:  Diagnosis Date  . Abnormal blood pressure    left arm from old brachial artery repair  . Allergy   . Anemia   . Anxiety   . Atherosclerosis of renal artery ([place])   . Attention deficit disorder without mention of hyperactivity   . Basal cell carcinoma of face   . Benign heart murmur   . Blood transfusion without reported diagnosis   . Breast cancer ([place])   . Carotid artery disease ([place])   . Carotid bruit    Left  .  Chronic bronchitis ([place])   . Depression   . DJD (degenerative joint disease) of hip   . Esophageal reflux   . History of depression   . History of spinal fusion   . Hypertension   . Morton's neuroma    Hx of, Left, 2 on right   . Myalgia and myositis, unspecified   . Other tenosynovitis of hand and wrist   . Pancreatitis    03-2018  . Peripheral neuropathy   . Personal history of alcoholism ([place])   . Personal history of peptic ulcer disease   . Pulmonary nodule   . Rotator cuff  disorder    pain  . Solitary cyst of breast   . Subclavian steal syndrome   . Substance abuse ([place])   . Unspecified hypothyroidism   . Urinary incontinence   . Wears glasses    Past Surgical History:  Procedure Laterality Date  . APPENDECTOMY    . BREAST LUMPECTOMY WITH RADIOACTIVE SEED AND SENTINEL LYMPH NODE BIOPSY Left 02/20/2019   Procedure: LEFT BREAST LUMPECTOMY X2 WITH RADIOACTIVE SEED X2 AND SENTINEL LYMPH NODE MAPPING;  Surgeon: Erroll Luna, MD;  Location: [place];  Service: General;  Laterality: Left;  . CARPOMETACARPEL SUSPENSION PLASTY Right 08/07/2013   Procedure: CARPOMETACARPEL [facility]) SUSPENSION PLASTY RIGHT THUMB;  Surgeon: Wynonia Sours, MD;  Location: [place];  Service: Orthopedics;  Laterality: Right;  . CARPOMETACARPEL SUSPENSION PLASTY Left 10/23/2015   Procedure: SUSPENSION PLASTY LEFT THUMB TRAPEZIUM EXCISION ABDUCTOR POLLICIS LONGUS TRANSFER;  Surgeon: Daryll Brod, MD;  Location: [place];  Service: Orthopedics;  Laterality: Left;  . CERVICAL FUSION  2007   Dr. Valli Glance approach  . COLONOSCOPY    . JOINT REPLACEMENT Left   . Left brachial artery repair of pseudoaneurysm  2001   post a cath  . LUMBAR FUSION  09/2004   T12-L5 (Dr. Patrice Paradise)  . MINOR IRRIGATION AND DEBRIDEMENT OF WOUND Right 08/27/2014   Procedure: MINOR IRRIGATION AND DEBRIDEMENT OF WOUND;  Surgeon: Daryll Brod, MD;  Location: [place];  Service: Orthopedics;  Laterality: Right;  . ORIF TIBIA FRACTURE  2010   Left distal  . Percutanous Transluminal Angioplasty of Renal Arteris    . REPAIR EXTENSOR TENDON Right 09/26/2014   Procedure: REPAIR EXTENSOR TENDON RIGHT RING FINGER ;  Surgeon: Daryll Brod, MD;  Location: [place];  Service: Orthopedics;  Laterality: Right;  . SHOULDER ARTHROSCOPY  09/2008   Left  . TONSILLECTOMY AND ADENOIDECTOMY    . TOTAL HIP ARTHROPLASTY  2008   left  . WISDOM TOOTH EXTRACTION        Allergies  Allergen Reactions  . Amoxicillin     REACTION: Rash  DID THE REACTION INVOLVE: Swelling of the face/tongue/throat, SOB, or low BP? No Sudden or severe rash/hives, skin peeling, or the inside of the mouth or nose? Yes Did it require medical treatment? No When did it last happen?1990s If all above answers are "NO", may proceed with cephalosporin use.   . Codeine Hives  . Hydrocodone-Acetaminophen     REACTION: itching  . Hydromorphone Hcl   . Morphine And Related Hives and Itching  . Morphine Sulfate     REACTION: unspecified  . Nsaids     Ulcer    Outpatient Encounter Medications as of 05/31/2019  Medication Sig  . alendronate (FOSAMAX) 70 MG tablet TAKE 1 TABLET BY MOUTH ONCE WEEKLY ON AN EMPTY STOMACH BEFORE BREAKFAST. REMAIN UPRIGHT FOR  30 MINUTES & TAKE WITH 8 OUNCES OF WATER  . amLODipine (NORVASC) 5 MG tablet TAKE 1 TO 2 TABLETS BY MOUTH ONCE DAILY (Patient taking differently: 5 mg in the morning and at bedtime. TAKE 1 TO 2 TABLETS BY MOUTH ONCE DAILY)  . aspirin 81 MG tablet Take 81 mg by mouth daily.   Marland Kitchen atomoxetine (STRATTERA) 100 MG capsule Take 100 mg by mouth daily.  Marland Kitchen atorvastatin (LIPITOR) 40 MG tablet Take 40 mg by mouth daily.  . DULoxetine (CYMBALTA) 60 MG capsule Take 1 capsule (60 mg total) by mouth daily.  . ferrous sulfate dried (SLOW FE) 160 (50 FE) MG TBCR Take 160 mg by mouth every other day.   . Glucosamine 500 MG TABS Take 1 tablet by mouth daily. Glucosamine-Chondronton  . levothyroxine (SYNTHROID) 125 MCG tablet TAKE ONE TABLET BY MOUTH DAILY  . losartan (COZAAR) 100 MG tablet TAKE ONE TABLET BY MOUTH DAILY  . Melatonin 1 MG CAPS Take 3 mg by mouth daily as needed.  Marland Kitchen oxybutynin (DITROPAN-XL) 10 MG 24 hr tablet TAKE ONE TABLET BY MOUTH AT BEDTIME  . pregabalin (LYRICA) 150 MG capsule Take 1 capsule (150 mg total) by mouth 2 (two) times daily.  . solifenacin (VESICARE) 10 MG tablet Take by mouth daily.  Marland Kitchen triamcinolone cream  (KENALOG) 0.1 % Apply 1 application topically 2 (two) times daily.  . [DISCONTINUED] divalproex (DEPAKOTE ER) 500 MG 24 hr tablet 500 mg at bedtime.  . [DISCONTINUED] pregabalin (LYRICA) 150 MG capsule Take 150 mg by mouth 2 (two) times daily.  Marland Kitchen PREVIDENT 5000 BOOSTER PLUS 1.1 % PSTE daily.  . [DISCONTINUED] acetaminophen (TYLENOL) 325 MG tablet Take 2 tablets (650 mg total) by mouth every 4 (four) hours as needed for moderate pain.  . [DISCONTINUED] cloNIDine (CATAPRES) 0.1 MG tablet TAKE ONE TABLET BY MOUTH THREE TIMES A DAY  . [DISCONTINUED] Lactulose 20 GM/30ML SOLN Take 15 mLs (10 g total) by mouth 2 (two) times daily. Until having good bowel movements, then take once daily (Patient taking differently: Take 15 mLs by mouth daily. Until having good bowel movements, then take once daily)  . [DISCONTINUED] letrozole (FEMARA) 2.5 MG tablet Take 1 tablet (2.5 mg total) by mouth daily.  . [DISCONTINUED] OLANZapine (ZYPREXA) 15 MG tablet Take 15 mg by mouth at bedtime.   No facility-administered encounter medications on file as of 05/31/2019.    Review of Systems  Constitutional: Positive for activity change. Negative for appetite change, chills, diaphoresis, fatigue, fever and unexpected weight change.  HENT: Negative for congestion.   Respiratory: Negative for cough, shortness of breath and wheezing.   Cardiovascular: Negative for chest pain, palpitations and leg swelling.  Gastrointestinal: Negative for abdominal distention, abdominal pain, constipation and diarrhea.  Genitourinary: Negative for difficulty urinating and dysuria.  Musculoskeletal: Positive for gait problem. Negative for arthralgias, back pain, joint swelling and myalgias.  Neurological: Positive for weakness (generalized). Negative for dizziness, tremors, seizures, syncope, facial asymmetry, speech difficulty, light-headedness, numbness and headaches.  Psychiatric/Behavioral: Negative for agitation, behavioral problems and  confusion.    Immunization History  Administered Date(s) Administered  . PFIZER SARS-COV-2 Vaccination 05/17/2019  . PPD Test 10/03/2013, 05/14/2014, 05/17/2014  . Pneumococcal Polysaccharide-23 11/29/2007  . Td 03/16/1999, 11/25/2009   Pertinent  Health Maintenance Due  Topic Date Due  . PNA vac Low Risk Adult (2 of 2 - PCV13) 11/28/2008  . INFLUENZA VACCINE  10/14/2018  . DEXA SCAN  Completed   Fall Risk  05/31/2019 10/30/2018 10/20/2017  09/21/2017 07/14/2017  Falls in the past year? 1 0 No Yes No  Comment - - - - -  Number falls in past yr: 0 - - 1 -  Injury with Fall? 1 - - No -  Risk Factor Category  - - - - -  Risk for fall due to : History of fall(s);Medication side effect - - Impaired mobility;Impaired balance/gait -  Follow up Falls evaluation completed;Education provided - - - -   Functional Status Survey: Is the patient deaf or have difficulty hearing?: No Does the patient have difficulty seeing, even when wearing glasses/contacts?: Yes Does the patient have difficulty concentrating, remembering, or making decisions?: No Does the patient have difficulty walking or climbing stairs?: Yes Does the patient have difficulty dressing or bathing?: No Does the patient have difficulty doing errands alone such as visiting a doctor's office or shopping?: No  Vitals:   05/31/19 1611  BP: (!) 100/52  Pulse: 69  Resp: 16  Temp: 97.9 F (36.6 C)  Weight: 121 lb 4.8 oz (55 kg)   Body mass index is 22.19 kg/m. Physical Exam Vitals and nursing note reviewed.  Constitutional:      General: She is not in acute distress.    Appearance: She is not diaphoretic.  HENT:     Head: Normocephalic and atraumatic.     Nose: Nose normal. No congestion.     Mouth/Throat:     Mouth: Mucous membranes are moist.     Pharynx: Oropharynx is clear.  Eyes:     General:        Right eye: No discharge.        Left eye: No discharge.     Extraocular Movements: Extraocular movements intact.      Conjunctiva/sclera: Conjunctivae normal.     Pupils: Pupils are equal, round, and reactive to light.  Neck:     Vascular: No JVD.  Cardiovascular:     Rate and Rhythm: Normal rate and regular rhythm.     Heart sounds: No murmur.  Pulmonary:     Effort: Pulmonary effort is normal. No respiratory distress.     Breath sounds: Normal breath sounds. No wheezing.  Abdominal:     General: Bowel sounds are normal. There is no distension.     Tenderness: There is no right CVA tenderness or left CVA tenderness.  Musculoskeletal:     Cervical back: No rigidity.     Right lower leg: No edema.  Lymphadenopathy:     Cervical: No cervical adenopathy.  Skin:    General: Skin is warm and dry.  Neurological:     General: No focal deficit present.     Mental Status: She is alert and oriented to person, place, and time. Mental status is at baseline.     Cranial Nerves: No cranial nerve deficit.     Comments: Slight generalized weakness   Psychiatric:        Mood and Affect: Mood normal.     Labs reviewed: Recent Labs    02/07/19 1430 03/07/19 1343 04/13/19 1505  NA 143 143 145  K 4.4 4.6 4.2  CL 104 108 110  CO2 28 28 29   GLUCOSE 123* 94 93  BUN 22 29* 37*  CREATININE 1.03* 0.92 0.90  CALCIUM 10.2 10.4* 10.8*   Recent Labs    02/07/19 1430 03/07/19 1343 04/13/19 1505  AST 21 14* 11*  ALT 19 11 9   ALKPHOS 48 48 46  BILITOT 0.4 0.3 0.3  PROT 6.9 6.9 6.9  ALBUMIN 3.8 4.1 4.2   Recent Labs    02/07/19 1430 03/07/19 1343 04/13/19 1505  WBC 5.0 5.4 7.2  NEUTROABS 2.9 2.7 4.1  HGB 10.9* 10.7* 11.3*  HCT 33.9* 32.9* 34.4*  MCV 100.6* 99.1 100.0  PLT 160 181 176   Lab Results  Component Value Date   TSH 23.31 (H) 02/07/2018   Lab Results  Component Value Date   HGBA1C 5.9 06/22/2017   Lab Results  Component Value Date   CHOL 136 03/25/2018   HDL 53 03/25/2018   LDLCALC 75 03/25/2018   LDLDIRECT 161.2 06/24/2011   TRIG 42 03/25/2018   CHOLHDL 2.6 03/25/2018     Significant Diagnostic Results in last 30 days:  NM Sentinel Node Inj-No Rpt (Breast)  Result Date: 02/20/2019 Sulfur colloid was injected by the nuclear medicine technologist for melanoma sentinel node.   MM Breast Surgical Specimen  Result Date: 02/20/2019 CLINICAL DATA:  Left lumpectomy for 2 adjacent invasive mammary carcinoma is in the upper inner quadrant of the left breast. EXAM: SPECIMEN RADIOGRAPH OF THE LEFT BREAST COMPARISON:  Previous exam(s). FINDINGS: Status post excision of the left breast. One of the radioactive seeds, ribbon shaped biopsy marker clip and coil shaped biopsy marker clip are in the specimen. The other seed is in a separate cup. The seeds and clips are intact. IMPRESSION: Specimen radiograph of the left breast. Electronically Signed   By: Claudie Revering M.D.   On: 02/20/2019 15:07    Assessment/Plan 1. TIA (transient ischemic attack) Resolved has slight weakness and needs PT  Continue Plavix for 15 more days  Continue baby aspirin and lipitor 40 mg qd   2. Aortic atherosclerosis (HCC) Noted on CT scan   3. Attention deficit hyperactivity disorder (ADHD), unspecified ADHD type No reported issues Continue straterra  4. Essential hypertension Continue Norvasc 5 mg bid ( usually a once daily regimen not sure if this needed to be divided for her to tolerate it )  ??of the Clonidine is for htn or ADD  5. Malignant neoplasm of upper-inner quadrant of left breast in female, estrogen receptor positive ([place]) S/p lumpectomy and two weeks of radiation. To resume radiation on 06/04/19. Discussed Femara with Dr. Marin Olp via epic messaging. He does not feel there is a risk of thromboembolism with this med, however, given her status change he agreed with discontinuing it and he can re address it in the next visit.   6. RENAL ARTERY STENOSIS S/p angioplasty   7. Subclavian steal syndrome On the left per patient but no treatment was indicated by her account and she  is asymptomatic   8. Chronic obstructive pulmonary disease, unspecified COPD type ([place]) Former smoker, not currently on meds or having symptoms   9. Hypothyroidism, unspecified type TSH 0.54 05/25/19 Continue Synthroid 125 mcg qd   10. Hereditary and idiopathic peripheral neuropathy Controlled, continue lyrica 150 mg bid  11. Age-related osteoporosis without current pathological fracture Continue Fosmax 70 mg qweekly   I have some concerns for polypharmacy and medication s/e given age and fall risk.  We will monitor her while here in rehab and possibly make appropriate changes in conjunction with the specialists, patient, and her family if needed.   Family/ staff Communication: discussed with resident   Labs/tests ordered:  NA

## 2019-06-01 DIAGNOSIS — R413 Other amnesia: Secondary | ICD-10-CM | POA: Diagnosis not present

## 2019-06-01 DIAGNOSIS — R278 Other lack of coordination: Secondary | ICD-10-CM | POA: Diagnosis not present

## 2019-06-01 DIAGNOSIS — M62571 Muscle wasting and atrophy, not elsewhere classified, right ankle and foot: Secondary | ICD-10-CM | POA: Diagnosis not present

## 2019-06-01 DIAGNOSIS — R4182 Altered mental status, unspecified: Secondary | ICD-10-CM | POA: Diagnosis not present

## 2019-06-01 DIAGNOSIS — R2689 Other abnormalities of gait and mobility: Secondary | ICD-10-CM | POA: Diagnosis not present

## 2019-06-01 DIAGNOSIS — M6389 Disorders of muscle in diseases classified elsewhere, multiple sites: Secondary | ICD-10-CM | POA: Diagnosis not present

## 2019-06-01 DIAGNOSIS — C50212 Malignant neoplasm of upper-inner quadrant of left female breast: Secondary | ICD-10-CM | POA: Diagnosis not present

## 2019-06-01 DIAGNOSIS — R2681 Unsteadiness on feet: Secondary | ICD-10-CM | POA: Diagnosis not present

## 2019-06-01 DIAGNOSIS — Z8673 Personal history of transient ischemic attack (TIA), and cerebral infarction without residual deficits: Secondary | ICD-10-CM | POA: Diagnosis not present

## 2019-06-01 DIAGNOSIS — G458 Other transient cerebral ischemic attacks and related syndromes: Secondary | ICD-10-CM | POA: Diagnosis not present

## 2019-06-04 DIAGNOSIS — M62571 Muscle wasting and atrophy, not elsewhere classified, right ankle and foot: Secondary | ICD-10-CM | POA: Diagnosis not present

## 2019-06-04 DIAGNOSIS — R2689 Other abnormalities of gait and mobility: Secondary | ICD-10-CM | POA: Diagnosis not present

## 2019-06-04 DIAGNOSIS — M6389 Disorders of muscle in diseases classified elsewhere, multiple sites: Secondary | ICD-10-CM | POA: Diagnosis not present

## 2019-06-04 DIAGNOSIS — C50212 Malignant neoplasm of upper-inner quadrant of left female breast: Secondary | ICD-10-CM | POA: Diagnosis not present

## 2019-06-04 DIAGNOSIS — R278 Other lack of coordination: Secondary | ICD-10-CM | POA: Diagnosis not present

## 2019-06-04 DIAGNOSIS — Z8673 Personal history of transient ischemic attack (TIA), and cerebral infarction without residual deficits: Secondary | ICD-10-CM | POA: Diagnosis not present

## 2019-06-04 DIAGNOSIS — Z510 Encounter for antineoplastic radiation therapy: Secondary | ICD-10-CM | POA: Diagnosis not present

## 2019-06-04 DIAGNOSIS — Z170 Estrogen receptor positive status [ER+]: Secondary | ICD-10-CM | POA: Diagnosis not present

## 2019-06-04 DIAGNOSIS — G458 Other transient cerebral ischemic attacks and related syndromes: Secondary | ICD-10-CM | POA: Diagnosis not present

## 2019-06-05 ENCOUNTER — Non-Acute Institutional Stay (SKILLED_NURSING_FACILITY): Payer: Medicare Other | Admitting: Internal Medicine

## 2019-06-05 ENCOUNTER — Encounter: Payer: Self-pay | Admitting: Internal Medicine

## 2019-06-05 DIAGNOSIS — J449 Chronic obstructive pulmonary disease, unspecified: Secondary | ICD-10-CM

## 2019-06-05 DIAGNOSIS — E039 Hypothyroidism, unspecified: Secondary | ICD-10-CM

## 2019-06-05 DIAGNOSIS — R2689 Other abnormalities of gait and mobility: Secondary | ICD-10-CM | POA: Diagnosis not present

## 2019-06-05 DIAGNOSIS — Z9189 Other specified personal risk factors, not elsewhere classified: Secondary | ICD-10-CM | POA: Diagnosis not present

## 2019-06-05 DIAGNOSIS — G458 Other transient cerebral ischemic attacks and related syndromes: Secondary | ICD-10-CM | POA: Diagnosis not present

## 2019-06-05 DIAGNOSIS — F909 Attention-deficit hyperactivity disorder, unspecified type: Secondary | ICD-10-CM | POA: Diagnosis not present

## 2019-06-05 DIAGNOSIS — F1021 Alcohol dependence, in remission: Secondary | ICD-10-CM

## 2019-06-05 DIAGNOSIS — G459 Transient cerebral ischemic attack, unspecified: Secondary | ICD-10-CM

## 2019-06-05 DIAGNOSIS — R278 Other lack of coordination: Secondary | ICD-10-CM | POA: Diagnosis not present

## 2019-06-05 DIAGNOSIS — Z8673 Personal history of transient ischemic attack (TIA), and cerebral infarction without residual deficits: Secondary | ICD-10-CM | POA: Diagnosis not present

## 2019-06-05 DIAGNOSIS — M6389 Disorders of muscle in diseases classified elsewhere, multiple sites: Secondary | ICD-10-CM | POA: Diagnosis not present

## 2019-06-05 DIAGNOSIS — C50212 Malignant neoplasm of upper-inner quadrant of left female breast: Secondary | ICD-10-CM | POA: Diagnosis not present

## 2019-06-05 DIAGNOSIS — Z510 Encounter for antineoplastic radiation therapy: Secondary | ICD-10-CM | POA: Diagnosis not present

## 2019-06-05 DIAGNOSIS — Z170 Estrogen receptor positive status [ER+]: Secondary | ICD-10-CM

## 2019-06-05 DIAGNOSIS — I10 Essential (primary) hypertension: Secondary | ICD-10-CM

## 2019-06-05 DIAGNOSIS — M62571 Muscle wasting and atrophy, not elsewhere classified, right ankle and foot: Secondary | ICD-10-CM | POA: Diagnosis not present

## 2019-06-05 NOTE — Progress Notes (Signed)
Patient ID: Rose [name], female   DOB: 07-25-1940, 79 y.o.   MRN: [id number]  Provider:  Gwenith Spitz. Renato Gails, D.O., C.M.D. [facility] Room Number: 128Place of Service:  SNF (31)SNF (rehab)  PCP: Esperanza Richters, PA-C Patient Care Team: Saguier, Kateri Mc as PCP - General (Internal Medicine) Judi Saa, DO as Attending Physician (Sports Medicine) Donnelly Angelica, RN as Oncology Nurse Navigator Pershing Proud, RN as Oncology Nurse Navigator  Extended Emergency Contact Information Primary Emergency Contact: Enzo Bi, [place] Home Phone: [phone] Relation: Daughter  Code Status: FULL CODE Goals of Care: Advanced Directive information Advanced Directives 06/05/2019  Does Patient Have a Medical Advance Directive? Yes  Type of Advance Directive -  Does patient want to make changes to medical advance directive? No - Patient declined  Copy of Healthcare Power of Attorney in Chart? -  Would patient like information on creating a medical advance directive? -   Chief Complaint  Patient presents with  . New Admit To SNF    New admit to SNF    HPI: Patient is a 79 y.o. female with h/o ADD, bipolar, vascular disease, breast cancer amid radiation therapy, prior smoker, COPD, peripheral neuropathy, htn, hypothyroidism seen today for [facility] s/p hospitalization at [facility] for suspected TIA.  NP note reviewed (hospital f/u done due to confusion about pt's medications b/w hospital list, home meds, meds pt had brought and was taking in room and meds daughter reported she took--bupropion and lithium).  We still need clarification of meds from [facility] local [facility] based on refills--we did receive a list from them which included zyprexa and depakote on it neither of which is in her pills, list, daughter's phone call, etc.    She's had covid pfizer  vaccine #1 on 05/17/19.    When seen, she requested lactulose be added which she takes for constipation and melatonin which she's been taking an absurd amount of at 35mg  nightly sometimes.  I agreed to 10mg .  When I visited, she was sitting in her recliner reading an old textbook and mentioned she's a history buff.  She's a retired IT trainer.  She has two cats at home.    Past Medical History:  Diagnosis Date  . Abnormal blood pressure    left arm from old brachial artery repair  . Allergy   . Anemia   . Anxiety   . Atherosclerosis of renal artery (HCC)   . Attention deficit disorder without mention of hyperactivity   . Basal cell carcinoma of face   . Benign heart murmur   . Blood transfusion without reported diagnosis   . Breast cancer (HCC)   . Carotid artery disease (HCC)   . Carotid bruit    Left  . Chronic bronchitis (HCC)   . Depression   . DJD (degenerative joint disease) of hip   . Esophageal reflux   . History of depression   . History of spinal fusion   . Hypertension   . Morton's neuroma    Hx of, Left, 2 on right   . Myalgia and myositis, unspecified   . Other tenosynovitis of hand and wrist   . Pancreatitis    03-2018  . Peripheral neuropathy   . Personal history of alcoholism (HCC)   . Personal history of peptic ulcer disease   . Pulmonary nodule   .  Rotator cuff disorder    pain  . Solitary cyst of breast   . Subclavian steal syndrome   . Substance abuse (HCC)   . Unspecified hypothyroidism   . Urinary incontinence   . Wears glasses    Past Surgical History:  Procedure Laterality Date  . APPENDECTOMY    . BREAST LUMPECTOMY WITH RADIOACTIVE SEED AND SENTINEL LYMPH NODE BIOPSY Left 02/20/2019   Procedure: LEFT BREAST LUMPECTOMY X2 WITH RADIOACTIVE SEED X2 AND SENTINEL LYMPH NODE MAPPING;  Surgeon: Harriette Bouillon, MD;  Location: [facility];  Service: General;  Laterality: Left;  . CARPOMETACARPEL SUSPENSION PLASTY Right 08/07/2013   Procedure:  CARPOMETACARPEL [facility]) SUSPENSION PLASTY RIGHT THUMB;  Surgeon: Nicki Reaper, MD;  Location: [facility];  Service: Orthopedics;  Laterality: Right;  . CARPOMETACARPEL SUSPENSION PLASTY Left 10/23/2015   Procedure: SUSPENSION PLASTY LEFT THUMB TRAPEZIUM EXCISION ABDUCTOR POLLICIS LONGUS TRANSFER;  Surgeon: Cindee Salt, MD;  Location: [facility];  Service: Orthopedics;  Laterality: Left;  . CERVICAL FUSION  2007   Dr. Maylene Roes approach  . COLONOSCOPY    . JOINT REPLACEMENT Left   . Left brachial artery repair of pseudoaneurysm  2001   post a cath  . LUMBAR FUSION  09/2004   T12-L5 (Dr. Noel Gerold)  . MINOR IRRIGATION AND DEBRIDEMENT OF WOUND Right 08/27/2014   Procedure: MINOR IRRIGATION AND DEBRIDEMENT OF WOUND;  Surgeon: Cindee Salt, MD;  Location: [facility];  Service: Orthopedics;  Laterality: Right;  . ORIF TIBIA FRACTURE  2010   Left distal  . Percutanous Transluminal Angioplasty of Renal Arteris    . REPAIR EXTENSOR TENDON Right 09/26/2014   Procedure: REPAIR EXTENSOR TENDON RIGHT RING FINGER ;  Surgeon: Cindee Salt, MD;  Location: [facility];  Service: Orthopedics;  Laterality: Right;  . SHOULDER ARTHROSCOPY  09/2008   Left  . TONSILLECTOMY AND ADENOIDECTOMY    . TOTAL HIP ARTHROPLASTY  2008   left  . WISDOM TOOTH EXTRACTION      Social History   Socioeconomic History  . Marital status: Single    Spouse name: Not on file  . Number of children: Y  . Years of education: Not on file  . Highest education level: Not on file  Occupational History  . Occupation: retired    Associate Professor: RETIRED    CommentEngineer, site, had own firm  Tobacco Use  . Smoking status: Former Smoker    Packs/day: 1.00    Years: 25.00    Pack years: 25.00    Types: Cigarettes    Quit date: 03/16/1983    Years since quitting: 36.2  . Smokeless tobacco: Never Used  Substance and Sexual Activity  . Alcohol use: Not Currently  . Drug use: No  . Sexual  activity: Never    Comment: 1st intercourse 79 yo-Fewer than 5 partners  Other Topics Concern  . Not on file  Social History Narrative   [employer]-accounting      Married '60-53yr divorced; '84-2 years, divorced      3 daughters- '61, '70, '71; 2 sons-'53,'55 (schizophrenic-died); 10 grandchildren      Lives alone with 1 dog and 3 cats      Pt unsure of family history- was adopted.                  Social Determinants of Health   Financial Resource Strain:   . Difficulty of Paying Living Expenses:   Food Insecurity:   .  Worried About Programme researcher, broadcasting/film/video in the Last Year:   . Barista in the Last Year:   Transportation Needs: No Transportation Needs  . Lack of Transportation (Medical): No  . Lack of Transportation (Non-Medical): No  Physical Activity:   . Days of Exercise per Week:   . Minutes of Exercise per Session:   Stress:   . Feeling of Stress :   Social Connections:   . Frequency of Communication with Friends and Family:   . Frequency of Social Gatherings with Friends and Family:   . Attends Religious Services:   . Active Member of Clubs or Organizations:   . Attends Banker Meetings:   Marland Kitchen Marital Status:     reports that she quit smoking about 36 years ago. Her smoking use included cigarettes. She has a 25.00 pack-year smoking history. She has never used smokeless tobacco. She reports previous alcohol use. She reports that she does not use drugs.  Functional Status Survey:    Family History  Adopted: Yes  Problem Relation Age of Onset  . Schizophrenia Son        Deceased 13  . Hypertension Daughter        Renal  . Healthy Son        x1  . Healthy Daughter        x2    Health Maintenance  Topic Date Due  . PNA vac Low Risk Adult (2 of 2 - PCV13) 11/28/2008  . INFLUENZA VACCINE  10/14/2018  . TETANUS/TDAP  11/26/2019  . DEXA SCAN  Completed    Allergies  Allergen Reactions  . Amoxicillin     REACTION:  Rash  DID THE REACTION INVOLVE: Swelling of the face/tongue/throat, SOB, or low BP? No Sudden or severe rash/hives, skin peeling, or the inside of the mouth or nose? Yes Did it require medical treatment? No When did it last happen?1990s If all above answers are "NO", may proceed with cephalosporin use.   . Codeine Hives  . Hydrocodone-Acetaminophen     REACTION: itching  . Hydromorphone Hcl   . Morphine And Related Hives and Itching  . Morphine Sulfate     REACTION: unspecified  . Nsaids     Ulcer    Outpatient Encounter Medications as of 06/05/2019  Medication Sig  . alendronate (FOSAMAX) 70 MG tablet TAKE 1 TABLET BY MOUTH ONCE WEEKLY ON AN EMPTY STOMACH BEFORE BREAKFAST. REMAIN UPRIGHT FOR 30 MINUTES & TAKE WITH 8 OUNCES OF WATER  . amLODipine (NORVASC) 5 MG tablet Take 5 mg by mouth daily. Take 5 mg PO 2 times aday  . aspirin 81 MG tablet Take 81 mg by mouth daily.   Marland Kitchen atomoxetine (STRATTERA) 100 MG capsule Take 100 mg by mouth daily.  Marland Kitchen atorvastatin (LIPITOR) 40 MG tablet Take 40 mg by mouth daily.  . Cholecalciferol 25 MCG (1000 UT) capsule Take 1,000 Units by mouth daily.  . clopidogrel (PLAVIX) 75 MG tablet Take 75 mg by mouth daily.  . DULoxetine (CYMBALTA) 60 MG capsule Take 1 capsule (60 mg total) by mouth daily.  . ferrous sulfate dried (SLOW FE) 160 (50 FE) MG TBCR Take 160 mg by mouth every other day.   . Glucosamine 500 MG TABS Take 1 tablet by mouth daily. Glucosamine-Chondronton  . levothyroxine (SYNTHROID) 125 MCG tablet TAKE ONE TABLET BY MOUTH DAILY  . losartan (COZAAR) 100 MG tablet TAKE ONE TABLET BY MOUTH DAILY  . Melatonin 1 MG CAPS Take  3 mg by mouth daily as needed.  Marland Kitchen oxybutynin (DITROPAN-XL) 10 MG 24 hr tablet TAKE ONE TABLET BY MOUTH AT BEDTIME  . pregabalin (LYRICA) 150 MG capsule Take 1 capsule (150 mg total) by mouth 2 (two) times daily.  Marland Kitchen PREVIDENT 5000 BOOSTER PLUS 1.1 % PSTE daily.  . solifenacin (VESICARE) 10 MG tablet Take by mouth  daily.  Marland Kitchen triamcinolone cream (KENALOG) 0.1 % Apply 1 application topically 2 (two) times daily.  . [DISCONTINUED] amLODipine (NORVASC) 5 MG tablet TAKE 1 TO 2 TABLETS BY MOUTH ONCE DAILY (Patient taking differently: 5 mg in the morning and at bedtime. TAKE 1 TO 2 TABLETS BY MOUTH ONCE DAILY)   No facility-administered encounter medications on file as of 06/05/2019.    Review of Systems  Constitutional: Negative for chills, fever and malaise/fatigue.  HENT: Negative for congestion and sore throat.   Eyes: Negative for blurred vision.  Respiratory: Negative for cough and shortness of breath.   Cardiovascular: Negative for chest pain, palpitations and leg swelling.  Gastrointestinal: Positive for constipation. Negative for abdominal pain, blood in stool, diarrhea and melena.  Genitourinary: Negative for dysuria.  Musculoskeletal: Positive for falls.  Skin: Negative for rash.  Neurological: Negative for dizziness and loss of consciousness.  Psychiatric/Behavioral: Positive for depression and memory loss. The patient has insomnia. The patient is not nervous/anxious.     Vitals:   06/05/19 1017  BP: 138/72  Pulse: 74  Temp: (!) 97 F (36.1 C)  SpO2: 93%  Weight: 121 lb (54.9 kg)  Height: 5\' 2"  (1.575 m)   Body mass index is 22.13 kg/m. Physical Exam Vitals reviewed.  Constitutional:      General: She is not in acute distress.    Appearance: Normal appearance. She is not toxic-appearing.     Comments: pale  HENT:     Head: Normocephalic and atraumatic.     Right Ear: External ear normal.     Left Ear: External ear normal.     Nose: Nose normal.     Mouth/Throat:     Pharynx: Oropharynx is clear.  Eyes:     Extraocular Movements: Extraocular movements intact.     Conjunctiva/sclera: Conjunctivae normal.     Pupils: Pupils are equal, round, and reactive to light.  Cardiovascular:     Rate and Rhythm: Normal rate and regular rhythm.     Pulses: Normal pulses.     Heart  sounds: Normal heart sounds.  Pulmonary:     Effort: Pulmonary effort is normal.     Breath sounds: Normal breath sounds. No wheezing, rhonchi or rales.  Abdominal:     General: Bowel sounds are normal. There is no distension.     Palpations: Abdomen is soft. There is no mass.     Tenderness: There is no abdominal tenderness. There is no guarding or rebound.  Musculoskeletal:        General: Normal range of motion.     Cervical back: Neck supple.     Right lower leg: No edema.     Left lower leg: No edema.  Lymphadenopathy:     Cervical: No cervical adenopathy.  Skin:    General: Skin is warm and dry.     Coloration: Skin is pale.  Neurological:     General: No focal deficit present.     Mental Status: She is alert and oriented to person, place, and time.     Cranial Nerves: No cranial nerve deficit.     Motor:  Weakness present.     Gait: Gait abnormal.     Comments: To be using walker, but has cane next to her and reports she is fine with it despite therapy recs  Psychiatric:     Comments: Was pleasant with me but only answered questions quickly, did not initiate conversation     Labs reviewed: Basic Metabolic Panel: Recent Labs    02/07/19 1430 03/07/19 1343 04/13/19 1505  NA 143 143 145  K 4.4 4.6 4.2  CL 104 108 110  CO2 28 28 29   GLUCOSE 123* 94 93  BUN 22 29* 37*  CREATININE 1.03* 0.92 0.90  CALCIUM 10.2 10.4* 10.8*   Liver Function Tests: Recent Labs    02/07/19 1430 03/07/19 1343 04/13/19 1505  AST 21 14* 11*  ALT 19 11 9   ALKPHOS 48 48 46  BILITOT 0.4 0.3 0.3  PROT 6.9 6.9 6.9  ALBUMIN 3.8 4.1 4.2   No results for input(s): LIPASE, AMYLASE in the last 8760 hours. No results for input(s): AMMONIA in the last 8760 hours. CBC: Recent Labs    02/07/19 1430 03/07/19 1343 04/13/19 1505  WBC 5.0 5.4 7.2  NEUTROABS 2.9 2.7 4.1  HGB 10.9* 10.7* 11.3*  HCT 33.9* 32.9* 34.4*  MCV 100.6* 99.1 100.0  PLT 160 181 176   Cardiac Enzymes: No results  for input(s): CKTOTAL, CKMB, CKMBINDEX, TROPONINI in the last 8760 hours. BNP: Invalid input(s): POCBNP Lab Results  Component Value Date   HGBA1C 5.9 06/22/2017   Lab Results  Component Value Date   TSH 23.31 (H) 02/07/2018   Lab Results  Component Value Date   VITAMINB12 719 10/13/2018   No results found for: FOLATE Lab Results  Component Value Date   IRON 72 04/07/2018   TIBC 302 04/07/2018   FERRITIN 37.5 05/04/2016    Imaging and Procedures obtained prior to SNF admission: NM Sentinel Node Inj-No Rpt (Breast)  Result Date: 02/20/2019 Sulfur colloid was injected by the nuclear medicine technologist for melanoma sentinel node.   MM Breast Surgical Specimen  Result Date: 02/20/2019 CLINICAL DATA:  Left lumpectomy for 2 adjacent invasive mammary carcinoma is in the upper inner quadrant of the left breast. EXAM: SPECIMEN RADIOGRAPH OF THE LEFT BREAST COMPARISON:  Previous exam(s). FINDINGS: Status post excision of the left breast. One of the radioactive seeds, ribbon shaped biopsy marker clip and coil shaped biopsy marker clip are in the specimen. The other seed is in a separate cup. The seeds and clips are intact. IMPRESSION: Specimen radiograph of the left breast. Electronically Signed   By: Beckie Salts M.D.   On: 02/20/2019 15:07    Assessment/Plan 1. Personal history of alcoholism (HCC) -noted; reports she quit drinking in January  2. TIA (transient ischemic attack) -seems this was determined due to no acute stroke and her vascular history; however, meds are a mystery for her with different lists in different health systems, from her, from her daughter, from the pharmacy and in a bottle of various different pills she had in her room, plus she mentions having taken 35mg  melatonin at night which is an awful lot -I'm concerned that #3 may be the culprit situation -cont PT  3. At risk for polypharmacy -see #2--meds are still not clear  -I'm not so sure she can manage  these herself either safely--we need to get to know her more  4. Attention deficit hyperactivity disorder (ADHD), unspecified ADHD type -reportedly on strattera for this, but daughter reports she's bipolar  and should be on lithium and bupropion; pharmacy list includes zyprexa and depakote  5. Malignant neoplasm of upper-inner quadrant of left breast in female, estrogen receptor positive (HCC) -currently getting XRT and tolerating well--feels a little fatigued, but not having pain  6. Essential hypertension -bp controlled with amlodipine which she oddly takes bid and losartan  7. Chronic obstructive pulmonary disease, unspecified COPD type (HCC) -not on tx  8. Hypothyroidism, unspecified type -on levothyroxine daily -needs f/u tsh if not already done Lab Results  Component Value Date   TSH 23.31 (H) 02/07/2018   Family/ staff Communication: discussed with SNF nurse and nurse managers  Labs/tests ordered:  Will need TSH   Martesha Niedermeier L. Amos Micheals, D.O. Geriatrics [facility] [address], [place] [address] Cell Phone (Mon-Fri 8am-5pm):  [phone] On Call:  [phone] & follow prompts after 5pm & weekends Office Phone:  [phone] Office Fax:  [phone]

## 2019-06-06 ENCOUNTER — Telehealth: Payer: Self-pay | Admitting: Medical

## 2019-06-06 DIAGNOSIS — M62571 Muscle wasting and atrophy, not elsewhere classified, right ankle and foot: Secondary | ICD-10-CM | POA: Diagnosis not present

## 2019-06-06 DIAGNOSIS — Z170 Estrogen receptor positive status [ER+]: Secondary | ICD-10-CM | POA: Diagnosis not present

## 2019-06-06 DIAGNOSIS — R2689 Other abnormalities of gait and mobility: Secondary | ICD-10-CM | POA: Diagnosis not present

## 2019-06-06 DIAGNOSIS — Z510 Encounter for antineoplastic radiation therapy: Secondary | ICD-10-CM | POA: Diagnosis not present

## 2019-06-06 DIAGNOSIS — R278 Other lack of coordination: Secondary | ICD-10-CM | POA: Diagnosis not present

## 2019-06-06 DIAGNOSIS — C50212 Malignant neoplasm of upper-inner quadrant of left female breast: Secondary | ICD-10-CM | POA: Diagnosis not present

## 2019-06-06 DIAGNOSIS — G458 Other transient cerebral ischemic attacks and related syndromes: Secondary | ICD-10-CM | POA: Diagnosis not present

## 2019-06-06 DIAGNOSIS — M6389 Disorders of muscle in diseases classified elsewhere, multiple sites: Secondary | ICD-10-CM | POA: Diagnosis not present

## 2019-06-06 DIAGNOSIS — Z8673 Personal history of transient ischemic attack (TIA), and cerebral infarction without residual deficits: Secondary | ICD-10-CM | POA: Diagnosis not present

## 2019-06-06 NOTE — Telephone Encounter (Signed)
Tabitha from [facility]   Requesting patient's last office visit Last CPE Vaccination Records   Please fax to [phone]

## 2019-06-06 NOTE — Telephone Encounter (Signed)
Faxed again

## 2019-06-07 DIAGNOSIS — C50212 Malignant neoplasm of upper-inner quadrant of left female breast: Secondary | ICD-10-CM | POA: Diagnosis not present

## 2019-06-07 DIAGNOSIS — M6389 Disorders of muscle in diseases classified elsewhere, multiple sites: Secondary | ICD-10-CM | POA: Diagnosis not present

## 2019-06-07 DIAGNOSIS — M62571 Muscle wasting and atrophy, not elsewhere classified, right ankle and foot: Secondary | ICD-10-CM | POA: Diagnosis not present

## 2019-06-07 DIAGNOSIS — G458 Other transient cerebral ischemic attacks and related syndromes: Secondary | ICD-10-CM | POA: Diagnosis not present

## 2019-06-07 DIAGNOSIS — Z8673 Personal history of transient ischemic attack (TIA), and cerebral infarction without residual deficits: Secondary | ICD-10-CM | POA: Diagnosis not present

## 2019-06-07 DIAGNOSIS — Z170 Estrogen receptor positive status [ER+]: Secondary | ICD-10-CM | POA: Diagnosis not present

## 2019-06-07 DIAGNOSIS — Z510 Encounter for antineoplastic radiation therapy: Secondary | ICD-10-CM | POA: Diagnosis not present

## 2019-06-07 DIAGNOSIS — R278 Other lack of coordination: Secondary | ICD-10-CM | POA: Diagnosis not present

## 2019-06-07 DIAGNOSIS — R2689 Other abnormalities of gait and mobility: Secondary | ICD-10-CM | POA: Diagnosis not present

## 2019-06-08 ENCOUNTER — Other Ambulatory Visit: Payer: Self-pay | Admitting: Medical

## 2019-06-08 DIAGNOSIS — Z9189 Other specified personal risk factors, not elsewhere classified: Secondary | ICD-10-CM | POA: Diagnosis not present

## 2019-06-08 DIAGNOSIS — Z8673 Personal history of transient ischemic attack (TIA), and cerebral infarction without residual deficits: Secondary | ICD-10-CM | POA: Diagnosis not present

## 2019-06-08 DIAGNOSIS — G458 Other transient cerebral ischemic attacks and related syndromes: Secondary | ICD-10-CM | POA: Diagnosis not present

## 2019-06-08 DIAGNOSIS — Z20828 Contact with and (suspected) exposure to other viral communicable diseases: Secondary | ICD-10-CM | POA: Diagnosis not present

## 2019-06-08 DIAGNOSIS — Z170 Estrogen receptor positive status [ER+]: Secondary | ICD-10-CM | POA: Diagnosis not present

## 2019-06-08 DIAGNOSIS — R2689 Other abnormalities of gait and mobility: Secondary | ICD-10-CM | POA: Diagnosis not present

## 2019-06-08 DIAGNOSIS — R278 Other lack of coordination: Secondary | ICD-10-CM | POA: Diagnosis not present

## 2019-06-08 DIAGNOSIS — C50212 Malignant neoplasm of upper-inner quadrant of left female breast: Secondary | ICD-10-CM | POA: Diagnosis not present

## 2019-06-08 DIAGNOSIS — Z510 Encounter for antineoplastic radiation therapy: Secondary | ICD-10-CM | POA: Diagnosis not present

## 2019-06-08 DIAGNOSIS — M6389 Disorders of muscle in diseases classified elsewhere, multiple sites: Secondary | ICD-10-CM | POA: Diagnosis not present

## 2019-06-08 DIAGNOSIS — M62571 Muscle wasting and atrophy, not elsewhere classified, right ankle and foot: Secondary | ICD-10-CM | POA: Diagnosis not present

## 2019-06-11 ENCOUNTER — Encounter: Payer: Self-pay | Admitting: Internal Medicine

## 2019-06-11 DIAGNOSIS — M62571 Muscle wasting and atrophy, not elsewhere classified, right ankle and foot: Secondary | ICD-10-CM | POA: Diagnosis not present

## 2019-06-11 DIAGNOSIS — Z8673 Personal history of transient ischemic attack (TIA), and cerebral infarction without residual deficits: Secondary | ICD-10-CM | POA: Diagnosis not present

## 2019-06-11 DIAGNOSIS — M6389 Disorders of muscle in diseases classified elsewhere, multiple sites: Secondary | ICD-10-CM | POA: Diagnosis not present

## 2019-06-11 DIAGNOSIS — C50212 Malignant neoplasm of upper-inner quadrant of left female breast: Secondary | ICD-10-CM | POA: Diagnosis not present

## 2019-06-11 DIAGNOSIS — R2689 Other abnormalities of gait and mobility: Secondary | ICD-10-CM | POA: Diagnosis not present

## 2019-06-11 DIAGNOSIS — G458 Other transient cerebral ischemic attacks and related syndromes: Secondary | ICD-10-CM | POA: Diagnosis not present

## 2019-06-11 DIAGNOSIS — Z170 Estrogen receptor positive status [ER+]: Secondary | ICD-10-CM | POA: Diagnosis not present

## 2019-06-11 DIAGNOSIS — Z510 Encounter for antineoplastic radiation therapy: Secondary | ICD-10-CM | POA: Diagnosis not present

## 2019-06-11 DIAGNOSIS — R278 Other lack of coordination: Secondary | ICD-10-CM | POA: Diagnosis not present

## 2019-06-12 ENCOUNTER — Encounter: Payer: Self-pay | Admitting: Internal Medicine

## 2019-06-12 ENCOUNTER — Ambulatory Visit: Payer: Medicare Other | Attending: Internal Medicine

## 2019-06-12 DIAGNOSIS — C50212 Malignant neoplasm of upper-inner quadrant of left female breast: Secondary | ICD-10-CM | POA: Diagnosis not present

## 2019-06-12 DIAGNOSIS — M62571 Muscle wasting and atrophy, not elsewhere classified, right ankle and foot: Secondary | ICD-10-CM | POA: Diagnosis not present

## 2019-06-12 DIAGNOSIS — Z510 Encounter for antineoplastic radiation therapy: Secondary | ICD-10-CM | POA: Diagnosis not present

## 2019-06-12 DIAGNOSIS — R2689 Other abnormalities of gait and mobility: Secondary | ICD-10-CM | POA: Diagnosis not present

## 2019-06-12 DIAGNOSIS — R278 Other lack of coordination: Secondary | ICD-10-CM | POA: Diagnosis not present

## 2019-06-12 DIAGNOSIS — Z170 Estrogen receptor positive status [ER+]: Secondary | ICD-10-CM | POA: Diagnosis not present

## 2019-06-12 DIAGNOSIS — G458 Other transient cerebral ischemic attacks and related syndromes: Secondary | ICD-10-CM | POA: Diagnosis not present

## 2019-06-12 DIAGNOSIS — Z8673 Personal history of transient ischemic attack (TIA), and cerebral infarction without residual deficits: Secondary | ICD-10-CM | POA: Diagnosis not present

## 2019-06-12 DIAGNOSIS — M6389 Disorders of muscle in diseases classified elsewhere, multiple sites: Secondary | ICD-10-CM | POA: Diagnosis not present

## 2019-06-12 DIAGNOSIS — Z23 Encounter for immunization: Secondary | ICD-10-CM

## 2019-06-12 NOTE — Progress Notes (Signed)
This encounter was created in error - please disregard.

## 2019-06-12 NOTE — Progress Notes (Signed)
  Location:  [facility] Room Number: 128 Place of Service:  SNF (31) Provider:  Lenton Gendreau L. Mariea Clonts, D.O., C.M.D.  Saguier, Percell Miller, PA-C  Patient Care Team: Elise Benne as PCP - General (Internal Medicine) Lyndal Pulley, DO as Attending Physician (Sports Medicine) Rockwell Germany, RN as Oncology Nurse Navigator Mauro Kaufmann, RN as Oncology Nurse Navigator Gabriel Carina, MD as Referring Physician (Radiation Oncology)  Extended Emergency Contact Information Primary Emergency Contact: Natasha Bence, [place] Phone: [phone] Relation: Daughter

## 2019-06-12 NOTE — Progress Notes (Signed)
   Covid-19 Vaccination Clinic  Name:  TYRONZA KITTELL    MRN: [id number] DOB: 04-07-1940  06/12/2019  Ms. Oreilly was observed post Covid-19 immunization for 15 minutes without incident. She was provided with Vaccine Information Sheet and instruction to access the V-Safe system.   Ms. Sala was instructed to call 911 with any severe reactions post vaccine: Marland Kitchen Difficulty breathing  . Swelling of face and throat  . A fast heartbeat  . A bad rash all over body  . Dizziness and weakness   Immunizations Administered    Name Date Dose VIS Date Route   Pfizer COVID-19 Vaccine 06/12/2019  2:40 PM 0.3 mL 02/23/2019 Intramuscular   Manufacturer: [employer], [employer]   Lot: [id number]   [place]: [id number]

## 2019-06-13 ENCOUNTER — Encounter: Payer: Self-pay | Admitting: Internal Medicine

## 2019-06-13 ENCOUNTER — Non-Acute Institutional Stay (SKILLED_NURSING_FACILITY): Payer: Medicare Other | Admitting: Internal Medicine

## 2019-06-13 DIAGNOSIS — F3130 Bipolar disorder, current episode depressed, mild or moderate severity, unspecified: Secondary | ICD-10-CM | POA: Diagnosis not present

## 2019-06-13 DIAGNOSIS — F1021 Alcohol dependence, in remission: Secondary | ICD-10-CM | POA: Diagnosis not present

## 2019-06-13 DIAGNOSIS — Z170 Estrogen receptor positive status [ER+]: Secondary | ICD-10-CM

## 2019-06-13 DIAGNOSIS — G459 Transient cerebral ischemic attack, unspecified: Secondary | ICD-10-CM | POA: Diagnosis not present

## 2019-06-13 DIAGNOSIS — Z9189 Other specified personal risk factors, not elsewhere classified: Secondary | ICD-10-CM

## 2019-06-13 DIAGNOSIS — C50212 Malignant neoplasm of upper-inner quadrant of left female breast: Secondary | ICD-10-CM

## 2019-06-13 DIAGNOSIS — I10 Essential (primary) hypertension: Secondary | ICD-10-CM

## 2019-06-13 MED ORDER — ATOMOXETINE HCL 100 MG PO CAPS: 100.0000 mg | ORAL_CAPSULE | Freq: Every day | ORAL | 11 refills | Status: AC

## 2019-06-13 MED ORDER — MELATONIN 1 MG PO CAPS: 3.0000 mg | ORAL_CAPSULE | Freq: Every day | ORAL | 11 refills | Status: AC | PRN

## 2019-06-13 MED ORDER — SLOW IRON 160 (50 FE) MG PO TBCR: 160.0000 mg | EXTENDED_RELEASE_TABLET | ORAL | 11 refills | Status: AC

## 2019-06-13 MED ORDER — PREGABALIN 150 MG PO CAPS: 150.0000 mg | ORAL_CAPSULE | Freq: Two times a day (BID) | ORAL | 5 refills | Status: DC

## 2019-06-13 MED ORDER — DIVALPROEX SODIUM ER 500 MG PO TB24: 500.0000 mg | ORAL_TABLET | Freq: Every day | ORAL | 11 refills | Status: DC

## 2019-06-13 MED ORDER — DULOXETINE HCL 60 MG PO CPEP: 60.0000 mg | ORAL_CAPSULE | Freq: Every day | ORAL | 11 refills | Status: DC

## 2019-06-13 MED ORDER — ASPIRIN 81 MG PO TBEC: 81.0000 mg | DELAYED_RELEASE_TABLET | Freq: Every day | ORAL | 11 refills | Status: AC

## 2019-06-13 MED ORDER — TRIAMCINOLONE ACETONIDE 0.1 % EX CREA: 1.0000 "application " | TOPICAL_CREAM | Freq: Two times a day (BID) | CUTANEOUS | 11 refills | Status: AC

## 2019-06-13 MED ORDER — AMLODIPINE BESYLATE 5 MG PO TABS: 5.0000 mg | ORAL_TABLET | Freq: Two times a day (BID) | ORAL | 11 refills | Status: AC

## 2019-06-13 MED ORDER — MIRABEGRON ER 25 MG PO TB24: 25.0000 mg | ORAL_TABLET | Freq: Every day | ORAL | 11 refills | Status: AC

## 2019-06-13 MED ORDER — OLANZAPINE 15 MG PO TABS: 15.0000 mg | ORAL_TABLET | Freq: Every day | ORAL | 11 refills | Status: DC

## 2019-06-13 MED ORDER — LACTULOSE 10 GM/15ML PO SOLN: 20.0000 g | Freq: Every day | ORAL | 11 refills | Status: AC | PRN

## 2019-06-13 MED ORDER — ALENDRONATE SODIUM 70 MG PO TABS: ORAL_TABLET | ORAL | 11 refills | Status: AC

## 2019-06-13 MED ORDER — LEVOTHYROXINE SODIUM 125 MCG PO TABS: 125.0000 ug | ORAL_TABLET | Freq: Every day | ORAL | 11 refills | Status: AC

## 2019-06-13 MED ORDER — CLONIDINE HCL 0.1 MG PO TABS: 0.1000 mg | ORAL_TABLET | Freq: Three times a day (TID) | ORAL | 11 refills | Status: DC

## 2019-06-13 MED ORDER — CHOLECALCIFEROL 25 MCG (1000 UT) PO CAPS: 1000.0000 [IU] | ORAL_CAPSULE | Freq: Every day | ORAL | 11 refills | Status: AC

## 2019-06-13 MED ORDER — GLUCOSAMINE 500 MG PO CAPS: 500.0000 mg | ORAL_CAPSULE | Freq: Every day | ORAL | 11 refills | Status: AC

## 2019-06-13 MED ORDER — ATORVASTATIN CALCIUM 40 MG PO TABS: 40.0000 mg | ORAL_TABLET | Freq: Every day | ORAL | 11 refills | Status: AC

## 2019-06-13 MED ORDER — LOSARTAN POTASSIUM 100 MG PO TABS: 100.0000 mg | ORAL_TABLET | Freq: Every day | ORAL | 11 refills | Status: AC

## 2019-06-13 MED ORDER — CLOPIDOGREL BISULFATE 75 MG PO TABS: 75.0000 mg | ORAL_TABLET | Freq: Every day | ORAL | 0 refills | Status: AC

## 2019-06-13 MED ORDER — SOLIFENACIN SUCCINATE 10 MG PO TABS: 10.0000 mg | ORAL_TABLET | Freq: Every day | ORAL | 11 refills | Status: DC

## 2019-06-13 NOTE — Progress Notes (Signed)
Patient ID: Rose [name], female   DOB: May 12, 1940, 79 y.o.   MRN: [id number]  Location:  [facility] Room Number: 128A Place of Service:  SNF (31)  Provider: Cassadee Vanzandt L. Renato Gails, D.O., C.M.D.  PCP: Esperanza Richters, PA-C Patient Care Team: Marisue Brooklyn as PCP - General (Internal Medicine) Judi Saa, DO as Attending Physician (Sports Medicine) Donnelly Angelica, RN as Oncology Nurse Navigator Pershing Proud, RN as Oncology Nurse Navigator Dorothyann Gibbs, MD as Referring Physician (Radiation Oncology)  Extended Emergency Contact Information Primary Emergency Contact: Enzo Bi, [place] Home Phone: [phone] Relation: Daughter  Code Status: full code Goals of care:  Advanced Directive information Advanced Directives 06/13/2019  Does Patient Have a Medical Advance Directive? No  Type of Advance Directive -  Does patient want to make changes to medical advance directive? No - Patient declined  Copy of Healthcare Power of Attorney in Chart? -  Would patient like information on creating a medical advance directive? -     Allergies  Allergen Reactions  . Amoxicillin     REACTION: Rash  DID THE REACTION INVOLVE: Swelling of the face/tongue/throat, SOB, or low BP? No Sudden or severe rash/hives, skin peeling, or the inside of the mouth or nose? Yes Did it require medical treatment? No When did it last happen?1990s If all above answers are "NO", may proceed with cephalosporin use.   . Codeine Hives  . Hydrocodone-Acetaminophen     REACTION: itching  . Hydromorphone Hcl   . Morphine And Related Hives and Itching  . Morphine Sulfate     REACTION: unspecified  . Nsaids     Ulcer    Chief Complaint  Patient presents with  . Discharge Note    Discharge to home after 4 meds are reconciled .Marland Kitchen F/U with P/[place] within 2 weeks     HPI:  79 y.o. female with h/o ADD, senile  osteoporosis, hyperlipidemia, hypothyroidism, carotid artery disease, COPD, neuropathy, alcohol abuse, left breast cancer receiving radiation who was here s/p possible TIA for rehab stay.  She has completed her PT and doing well.  She is to use her rolling walker with skis ongoing.  There were lots of concerns about her medication list which varied b/w her, her daughter, her [facility] and her cone chart and she had a bottle of mixed pills in her possession.  Finally, on the day of her discharge, we were able to obtain her notes from her psychiatry office [facility] [place]).  It indicates diagnoses of bipolar disorder and alcohol dependence which she had ceased drinking in feb 2020.    They indicated the following as her med list: depakote er 500mg  qhs--not getting here bc pt did not report taking zyprexa 15mg  qhs--not getting here b/c pt did not report taking cymbalta 60mg  qam strattera 100mg  qam Melatonin 3mg  qhs Atarax 10mg  po bid--not on our list and prefer to avoid it Alendronate 70mg  weekly Amlodipine 5mg  po bid Asa 81mg  daily Clonidine 0.1mg  po tid Ferrous sulfate 160mg  po bid--our list is qod Glucosamine 500mg  daily Synthroid 112 mcg daily Valsartan 50mg  qam--not on our list and bp controlled here with losartan 100mg  daily instead MVI daily--not on our list Tylenol prn--not on our list We have lyrica 150mg  po bid, vesicare 100mg  daily, kenalog cream and lactulose 20g daily prn NOT on psychiatry list.  Past Medical History:  Diagnosis Date  .  Abnormal blood pressure    left arm from old brachial artery repair  . Allergy   . Anemia   . Anxiety   . Atherosclerosis of renal artery (HCC)   . Attention deficit disorder without mention of hyperactivity   . Basal cell carcinoma of face   . Benign heart murmur   . Blood transfusion without reported diagnosis   . Breast cancer (HCC)   . Carotid artery disease (HCC)   . Carotid bruit    Left  . Chronic bronchitis (HCC)   .  Depression   . DJD (degenerative joint disease) of hip   . Esophageal reflux   . History of depression   . History of spinal fusion   . Hypertension   . Morton's neuroma    Hx of, Left, 2 on right   . Myalgia and myositis, unspecified   . Other tenosynovitis of hand and wrist   . Pancreatitis    03-2018  . Peripheral neuropathy   . Personal history of alcoholism (HCC)   . Personal history of peptic ulcer disease   . Pulmonary nodule   . Rotator cuff disorder    pain  . Solitary cyst of breast   . Subclavian steal syndrome   . Substance abuse (HCC)   . Unspecified hypothyroidism   . Urinary incontinence   . Wears glasses     Past Surgical History:  Procedure Laterality Date  . APPENDECTOMY    . BREAST LUMPECTOMY WITH RADIOACTIVE SEED AND SENTINEL LYMPH NODE BIOPSY Left 02/20/2019   Procedure: LEFT BREAST LUMPECTOMY X2 WITH RADIOACTIVE SEED X2 AND SENTINEL LYMPH NODE MAPPING;  Surgeon: Harriette Bouillon, MD;  Location: [facility];  Service: General;  Laterality: Left;  . CARPOMETACARPEL SUSPENSION PLASTY Right 08/07/2013   Procedure: CARPOMETACARPEL [facility]) SUSPENSION PLASTY RIGHT THUMB;  Surgeon: Nicki Reaper, MD;  Location: [facility];  Service: Orthopedics;  Laterality: Right;  . CARPOMETACARPEL SUSPENSION PLASTY Left 10/23/2015   Procedure: SUSPENSION PLASTY LEFT THUMB TRAPEZIUM EXCISION ABDUCTOR POLLICIS LONGUS TRANSFER;  Surgeon: Cindee Salt, MD;  Location: [facility];  Service: Orthopedics;  Laterality: Left;  . CERVICAL FUSION  2007   Dr. Maylene Roes approach  . COLONOSCOPY    . JOINT REPLACEMENT Left   . Left brachial artery repair of pseudoaneurysm  2001   post a cath  . LUMBAR FUSION  09/2004   T12-L5 (Dr. Noel Gerold)  . MINOR IRRIGATION AND DEBRIDEMENT OF WOUND Right 08/27/2014   Procedure: MINOR IRRIGATION AND DEBRIDEMENT OF WOUND;  Surgeon: Cindee Salt, MD;  Location: [facility];  Service: Orthopedics;  Laterality:  Right;  . ORIF TIBIA FRACTURE  2010   Left distal  . Percutanous Transluminal Angioplasty of Renal Arteris    . REPAIR EXTENSOR TENDON Right 09/26/2014   Procedure: REPAIR EXTENSOR TENDON RIGHT RING FINGER ;  Surgeon: Cindee Salt, MD;  Location: [facility];  Service: Orthopedics;  Laterality: Right;  . SHOULDER ARTHROSCOPY  09/2008   Left  . TONSILLECTOMY AND ADENOIDECTOMY    . TOTAL HIP ARTHROPLASTY  2008   left  . WISDOM TOOTH EXTRACTION        reports that she quit smoking about 36 years ago. Her smoking use included cigarettes. She has a 25.00 pack-year smoking history. She has never used smokeless tobacco. She reports previous alcohol use. She reports that she does not use drugs.  Functional Status Survey:    Allergies  Allergen Reactions  . Amoxicillin  REACTION: Rash  DID THE REACTION INVOLVE: Swelling of the face/tongue/throat, SOB, or low BP? No Sudden or severe rash/hives, skin peeling, or the inside of the mouth or nose? Yes Did it require medical treatment? No When did it last happen?1990s If all above answers are "NO", may proceed with cephalosporin use.   . Codeine Hives  . Hydrocodone-Acetaminophen     REACTION: itching  . Hydromorphone Hcl   . Morphine And Related Hives and Itching  . Morphine Sulfate     REACTION: unspecified  . Nsaids     Ulcer    Pertinent  Health Maintenance Due  Topic Date Due  . PNA vac Low Risk Adult (2 of 2 - PCV13) 11/28/2008  . INFLUENZA VACCINE  10/14/2018  . DEXA SCAN  Completed    Medications: Outpatient Encounter Medications as of 06/13/2019  Medication Sig  . alendronate (FOSAMAX) 70 MG tablet TAKE 1 TABLET BY MOUTH ONCE WEEKLY ON AN EMPTY STOMACH BEFORE BREAKFAST. REMAIN UPRIGHT FOR 30 MINUTES & TAKE WITH 8 OUNCES OF WATER  . amLODipine (NORVASC) 5 MG tablet Take 5 mg by mouth daily. Take 5 mg PO 2 times aday  . aspirin 81 MG tablet Take 81 mg by mouth daily.   Marland Kitchen atomoxetine (STRATTERA) 100 MG  capsule Take 100 mg by mouth daily.  Marland Kitchen atorvastatin (LIPITOR) 40 MG tablet Take 40 mg by mouth daily.  . Cholecalciferol 25 MCG (1000 UT) capsule Take 1,000 Units by mouth daily.  . cloNIDine (CATAPRES) 0.1 MG tablet TAKE ONE TABLET BY MOUTH THREE TIMES A DAY  . clopidogrel (PLAVIX) 75 MG tablet Take 75 mg by mouth daily.  . DULoxetine (CYMBALTA) 60 MG capsule Take 1 capsule (60 mg total) by mouth daily.  . ferrous sulfate dried (SLOW FE) 160 (50 FE) MG TBCR Take 160 mg by mouth every other day.   . Glucosamine 500 MG TABS Take 1 tablet by mouth daily. Glucosamine-Chondronton  . lactulose (CHRONULAC) 10 GM/15ML solution Take 20 g by mouth daily as needed for mild constipation.  Marland Kitchen levothyroxine (SYNTHROID) 125 MCG tablet TAKE ONE TABLET BY MOUTH DAILY  . losartan (COZAAR) 100 MG tablet TAKE ONE TABLET BY MOUTH DAILY  . Melatonin 1 MG CAPS Take 3 mg by mouth daily as needed.  . mirabegron ER (MYRBETRIQ) 25 MG TB24 tablet Take 25 mg by mouth daily. Overactive bladder  . oxybutynin (DITROPAN-XL) 10 MG 24 hr tablet TAKE ONE TABLET BY MOUTH AT BEDTIME  . pregabalin (LYRICA) 150 MG capsule Take 1 capsule (150 mg total) by mouth 2 (two) times daily.  Marland Kitchen PREVIDENT 5000 BOOSTER PLUS 1.1 % PSTE daily.  . solifenacin (VESICARE) 10 MG tablet Take by mouth daily.  Marland Kitchen triamcinolone cream (KENALOG) 0.1 % Apply 1 application topically 2 (two) times daily.   No facility-administered encounter medications on file as of 06/13/2019.    Review of Systems  Constitutional: Negative for chills and fever.  HENT: Negative for congestion, hearing loss and sore throat.   Eyes:       Glasses for reading  Respiratory: Negative for cough and shortness of breath.   Cardiovascular: Negative for chest pain, palpitations and leg swelling.  Gastrointestinal: Positive for constipation. Negative for abdominal pain, blood in stool, diarrhea and melena.  Genitourinary: Negative for dysuria.  Musculoskeletal: Positive for back  pain and falls. Negative for joint pain.  Skin: Negative for itching and rash.  Neurological: Positive for tingling and sensory change.  Endo/Heme/Allergies: Bruises/bleeds easily.  Psychiatric/Behavioral: Positive  for depression and memory loss. The patient has insomnia. The patient is not nervous/anxious.     Vitals:   06/13/19 1045  BP: 115/61  Pulse: 68  Temp: 97.7 F (36.5 C)  SpO2: 99%  Weight: 121 lb 4.8 oz (55 kg)  Height: 5\' 2"  (1.575 m)   Body mass index is 22.19 kg/m. Physical Exam Vitals reviewed.  Constitutional:      General: She is not in acute distress.    Appearance: Normal appearance. She is not ill-appearing or toxic-appearing.  HENT:     Head: Normocephalic and atraumatic.     Right Ear: External ear normal.     Left Ear: External ear normal.  Eyes:     Extraocular Movements: Extraocular movements intact.     Conjunctiva/sclera: Conjunctivae normal.     Pupils: Pupils are equal, round, and reactive to light.     Comments: Reading glasses  Cardiovascular:     Rate and Rhythm: Normal rate and regular rhythm.  Pulmonary:     Effort: Pulmonary effort is normal.     Breath sounds: Normal breath sounds. No wheezing, rhonchi or rales.  Abdominal:     General: Bowel sounds are normal.  Musculoskeletal:        General: Normal range of motion.     Right lower leg: No edema.     Left lower leg: No edema.     Comments: Ambulating using rolling walker with skis  Skin:    General: Skin is warm and dry.     Capillary Refill: Capillary refill takes less than 2 seconds.  Neurological:     General: No focal deficit present.     Mental Status: She is alert and oriented to person, place, and time.     Comments: But 25/30 on mmse, failed clock  Psychiatric:     Comments: Pleasant with me, but has flat affect     Labs reviewed: Basic Metabolic Panel: Recent Labs    02/07/19 1430 03/07/19 1343 04/13/19 1505  NA 143 143 145  K 4.4 4.6 4.2  CL 104 108 110    CO2 28 28 29   GLUCOSE 123* 94 93  BUN 22 29* 37*  CREATININE 1.03* 0.92 0.90  CALCIUM 10.2 10.4* 10.8*   Liver Function Tests: Recent Labs    02/07/19 1430 03/07/19 1343 04/13/19 1505  AST 21 14* 11*  ALT 19 11 9   ALKPHOS 48 48 46  BILITOT 0.4 0.3 0.3  PROT 6.9 6.9 6.9  ALBUMIN 3.8 4.1 4.2   No results for input(s): LIPASE, AMYLASE in the last 8760 hours. No results for input(s): AMMONIA in the last 8760 hours. CBC: Recent Labs    02/07/19 1430 03/07/19 1343 04/13/19 1505  WBC 5.0 5.4 7.2  NEUTROABS 2.9 2.7 4.1  HGB 10.9* 10.7* 11.3*  HCT 33.9* 32.9* 34.4*  MCV 100.6* 99.1 100.0  PLT 160 181 176   Cardiac Enzymes: No results for input(s): CKTOTAL, CKMB, CKMBINDEX, TROPONINI in the last 8760 hours. BNP: Invalid input(s): POCBNP CBG: No results for input(s): GLUCAP in the last 8760 hours.  Procedures and Imaging Studies During Stay: Hospital records were reviewed  Assessment/Plan:   1. At risk for polypharmacy -pt has not been taking her meds as directed  2. TIA (transient ischemic attack) -not so sure she truly had a TIA -suspect she took her meds incorrectly and it caused her slurred speech -continue bp, lipid mgt to prevent strokes  3. Personal history of alcoholism (HCC) -reports  quitting last jan--records say last feb after pancreatitis hospitalization -encouraged her to continue off alcohol  4. Bipolar affective disorder, current episode depressed, current episode severity unspecified (HCC) - has not been taking depakote or zyprexa as directed prior to admission or during admission b/c we were not certain of her list until today -will add these two to her medications and see how she is doing when she follows up at [facility] - divalproex (DEPAKOTE ER) 500 MG 24 hr tablet; Take 1 tablet (500 mg total) by mouth daily.  Dispense: 30 tablet; Refill: 11 - OLANZapine (ZYPREXA) 15 MG tablet; Take 1 tablet (15 mg total) by mouth at bedtime.  Dispense: 30 tablet;  Refill: 11  5. Malignant neoplasm of upper-inner quadrant of left breast in female, estrogen receptor positive (HCC) -cont radiation therapy  -currently off her anti-estrogen tx that was prescribed (was approved to be off due to her acute admission)  6. Essential hypertension -bp is controlled on arb and CCB  Patient is being discharged with the following home health services:  None  Will be getting pill packs through [facility].    Patient is being discharged with the following durable medical equipment:  None  Patient has been advised to f/u at [facility] in 1-2 weeks to for a transitions of care visit.    Pt was provided with adequate prescriptions of noncontrolled medications to reach the scheduled appointment .   Future labs/tests needed:  TBD at next visit  Rose [name], D.O. Geriatrics [facility] [address], [place] [address] Cell Phone (Mon-Fri 8am-5pm):  [phone] On Call:  [phone] & follow prompts after 5pm & weekends Office Phone:  [phone] Office Fax:  [phone]

## 2019-06-13 NOTE — Progress Notes (Deleted)
Location:  Occup[facility] psychologist of Service:  SNF (31)  Provider: Austyn Seier L. Mariea Clonts, D.O., C.M.D.  PCP: Mackie Pai, PA-C Patient Care Team: Elise Benne as PCP - General (Internal Medicine) Lyndal Pulley, DO as Attending Physician (Sports Medicine) Rockwell Germany, RN as Oncology Nurse Navigator Mauro Kaufmann, RN as Oncology Nurse Navigator Gabriel Carina, MD as Referring Physician (Radiation Oncology)  Extended Emergency Contact Information Primary Emergency Contact: Natasha Bence, [place] Phone: [phone] Relation: Daughter  Code Status: *** Goals of care:  Advanced Directive information Advanced Directives 06/12/2019  Does Patient Have a Medical Advance Directive? No  Type of Advance Directive -  Does patient want to make changes to medical advance directive? No - Guardian declined  Copy of [place] in Chart? -  Would patient like information on creating a medical advance directive? -     Allergies  Allergen Reactions  . Amoxicillin     REACTION: Rash  DID THE REACTION INVOLVE: Swelling of the face/tongue/throat, SOB, or low BP? No Sudden or severe rash/hives, skin peeling, or the inside of the mouth or nose? Yes Did it require medical treatment? No When did it last happen?1990s If all above answers are "NO", may proceed with cephalosporin use.   . Codeine Hives  . Hydrocodone-Acetaminophen     REACTION: itching  . Hydromorphone Hcl   . Morphine And Related Hives and Itching  . Morphine Sulfate     REACTION: unspecified  . Nsaids     Ulcer    No chief complaint on file.   HPI:  79 y.o. female      Past Medical History:  Diagnosis Date  . Abnormal blood pressure    left arm from old brachial artery repair  . Allergy   . Anemia   . Anxiety   . Atherosclerosis of renal artery ([place])   . Attention deficit disorder without mention of  hyperactivity   . Basal cell carcinoma of face   . Benign heart murmur   . Blood transfusion without reported diagnosis   . Breast cancer ([place])   . Carotid artery disease ([place])   . Carotid bruit    Left  . Chronic bronchitis ([place])   . Depression   . DJD (degenerative joint disease) of hip   . Esophageal reflux   . History of depression   . History of spinal fusion   . Hypertension   . Morton's neuroma    Hx of, Left, 2 on right   . Myalgia and myositis, unspecified   . Other tenosynovitis of hand and wrist   . Pancreatitis    03-2018  . Peripheral neuropathy   . Personal history of alcoholism ([place])   . Personal history of peptic ulcer disease   . Pulmonary nodule   . Rotator cuff disorder    pain  . Solitary cyst of breast   . Subclavian steal syndrome   . Substance abuse ([place])   . Unspecified hypothyroidism   . Urinary incontinence   . Wears glasses     Past Surgical History:  Procedure Laterality Date  . APPENDECTOMY    . BREAST LUMPECTOMY WITH RADIOACTIVE SEED AND SENTINEL LYMPH NODE BIOPSY Left 02/20/2019   Procedure: LEFT BREAST LUMPECTOMY X2 WITH RADIOACTIVE SEED X2 AND SENTINEL LYMPH NODE MAPPING;  Surgeon: Erroll Luna, MD;  Location: [facility];  Service: General;  Laterality: Left;  . CARPOMETACARPEL SUSPENSION PLASTY Right 08/07/2013   Procedure: CARPOMETACARPEL [facility]) SUSPENSION PLASTY RIGHT THUMB;  Surgeon: Wynonia Sours, MD;  Location: [facility];  Service: Orthopedics;  Laterality: Right;  . CARPOMETACARPEL SUSPENSION PLASTY Left 10/23/2015   Procedure: SUSPENSION PLASTY LEFT THUMB TRAPEZIUM EXCISION ABDUCTOR POLLICIS LONGUS TRANSFER;  Surgeon: Daryll Brod, MD;  Location: [facility];  Service: Orthopedics;  Laterality: Left;  . CERVICAL FUSION  2007   Dr. Valli Glance approach  . COLONOSCOPY    . JOINT REPLACEMENT Left   . Left brachial artery repair of pseudoaneurysm  2001   post a cath  . LUMBAR FUSION   09/2004   T12-L5 (Dr. Patrice Paradise)  . MINOR IRRIGATION AND DEBRIDEMENT OF WOUND Right 08/27/2014   Procedure: MINOR IRRIGATION AND DEBRIDEMENT OF WOUND;  Surgeon: Daryll Brod, MD;  Location: [facility];  Service: Orthopedics;  Laterality: Right;  . ORIF TIBIA FRACTURE  2010   Left distal  . Percutanous Transluminal Angioplasty of Renal Arteris    . REPAIR EXTENSOR TENDON Right 09/26/2014   Procedure: REPAIR EXTENSOR TENDON RIGHT RING FINGER ;  Surgeon: Daryll Brod, MD;  Location: [facility];  Service: Orthopedics;  Laterality: Right;  . SHOULDER ARTHROSCOPY  09/2008   Left  . TONSILLECTOMY AND ADENOIDECTOMY    . TOTAL HIP ARTHROPLASTY  2008   left  . WISDOM TOOTH EXTRACTION        reports that she quit smoking about 36 years ago. Her smoking use included cigarettes. She has a 25.00 pack-year smoking history. She has never used smokeless tobacco. She reports previous alcohol use. She reports that she does not use drugs.  Functional Status Survey:    Allergies  Allergen Reactions  . Amoxicillin     REACTION: Rash  DID THE REACTION INVOLVE: Swelling of the face/tongue/throat, SOB, or low BP? No Sudden or severe rash/hives, skin peeling, or the inside of the mouth or nose? Yes Did it require medical treatment? No When did it last happen?1990s If all above answers are "NO", may proceed with cephalosporin use.   . Codeine Hives  . Hydrocodone-Acetaminophen     REACTION: itching  . Hydromorphone Hcl   . Morphine And Related Hives and Itching  . Morphine Sulfate     REACTION: unspecified  . Nsaids     Ulcer    Pertinent  Health Maintenance Due  Topic Date Due  . PNA vac Low Risk Adult (2 of 2 - PCV13) 11/28/2008  . INFLUENZA VACCINE  10/14/2018  . DEXA SCAN  Completed    Medications: Outpatient Encounter Medications as of 06/13/2019  Medication Sig  . alendronate (FOSAMAX) 70 MG tablet TAKE 1 TABLET BY MOUTH ONCE WEEKLY ON AN EMPTY STOMACH BEFORE  BREAKFAST. REMAIN UPRIGHT FOR 30 MINUTES & TAKE WITH 8 OUNCES OF WATER  . amLODipine (NORVASC) 5 MG tablet Take 5 mg by mouth daily. Take 5 mg PO 2 times aday  . aspirin 81 MG tablet Take 81 mg by mouth daily.   Marland Kitchen atomoxetine (STRATTERA) 100 MG capsule Take 100 mg by mouth daily.  Marland Kitchen atorvastatin (LIPITOR) 40 MG tablet Take 40 mg by mouth daily.  . Cholecalciferol 25 MCG (1000 UT) capsule Take 1,000 Units by mouth daily.  . cloNIDine (CATAPRES) 0.1 MG tablet TAKE ONE TABLET BY MOUTH THREE TIMES A DAY  . clopidogrel (PLAVIX) 75 MG tablet Take 75 mg by mouth daily.  . DULoxetine (CYMBALTA) 60 MG capsule Take  1 capsule (60 mg total) by mouth daily.  . ferrous sulfate dried (SLOW FE) 160 (50 FE) MG TBCR Take 160 mg by mouth every other day.   . Glucosamine 500 MG TABS Take 1 tablet by mouth daily. Glucosamine-Chondronton  . lactulose (CHRONULAC) 10 GM/15ML solution Take 20 g by mouth daily as needed for mild constipation.  Marland Kitchen levothyroxine (SYNTHROID) 125 MCG tablet TAKE ONE TABLET BY MOUTH DAILY  . losartan (COZAAR) 100 MG tablet TAKE ONE TABLET BY MOUTH DAILY  . Melatonin 1 MG CAPS Take 3 mg by mouth daily as needed.  . mirabegron ER (MYRBETRIQ) 25 MG TB24 tablet Take 25 mg by mouth daily. Overactive bladder  . oxybutynin (DITROPAN-XL) 10 MG 24 hr tablet TAKE ONE TABLET BY MOUTH AT BEDTIME  . pregabalin (LYRICA) 150 MG capsule Take 1 capsule (150 mg total) by mouth 2 (two) times daily.  Marland Kitchen PREVIDENT 5000 BOOSTER PLUS 1.1 % PSTE daily.  . solifenacin (VESICARE) 10 MG tablet Take by mouth daily.  Marland Kitchen triamcinolone cream (KENALOG) 0.1 % Apply 1 application topically 2 (two) times daily.   No facility-administered encounter medications on file as of 06/13/2019.    ROS  There were no vitals filed for this visit. There is no height or weight on file to calculate BMI. Physical Exam  Labs reviewed: Basic Metabolic Panel: Recent Labs    02/07/19 1430 03/07/19 1343 04/13/19 1505  NA 143 143 145    K 4.4 4.6 4.2  CL 104 108 110  CO2 28 28 29   GLUCOSE 123* 94 93  BUN 22 29* 37*  CREATININE 1.03* 0.92 0.90  CALCIUM 10.2 10.4* 10.8*   Liver Function Tests: Recent Labs    02/07/19 1430 03/07/19 1343 04/13/19 1505  AST 21 14* 11*  ALT 19 11 9   ALKPHOS 48 48 46  BILITOT 0.4 0.3 0.3  PROT 6.9 6.9 6.9  ALBUMIN 3.8 4.1 4.2   No results for input(s): LIPASE, AMYLASE in the last 8760 hours. No results for input(s): AMMONIA in the last 8760 hours. CBC: Recent Labs    02/07/19 1430 03/07/19 1343 04/13/19 1505  WBC 5.0 5.4 7.2  NEUTROABS 2.9 2.7 4.1  HGB 10.9* 10.7* 11.3*  HCT 33.9* 32.9* 34.4*  MCV 100.6* 99.1 100.0  PLT 160 181 176   Cardiac Enzymes: No results for input(s): CKTOTAL, CKMB, CKMBINDEX, TROPONINI in the last 8760 hours. BNP: Invalid input(s): POCBNP CBG: No results for input(s): GLUCAP in the last 8760 hours.  Procedures and Imaging Studies During Stay: No results found.  Assessment/Plan:   There are no diagnoses linked to this encounter.   Patient is being discharged with the following home health services:    Patient is being discharged with the following durable medical equipment:    Patient has been advised to f/u with their PCP in 1-2 weeks to for a transitions of care visit.  Social services at their facility was responsible for arranging this appointment.  Pt was provided with adequate prescriptions of noncontrolled medications to reach the scheduled appointment .  For controlled substances, a limited supply was provided as appropriate for the individual patient.  If the pt normally receives these medications from a pain clinic or has a contract with another physician, these medications should be received from that clinic or physician only).     Future labs/tests needed:  ***  Shameeka Silliman L. Isaias Dowson, D.O. [facility] [address], [place] [address] Cell Phone (  Mon-Fri 8am-5pm):   [phone] On Call:  [phone] & follow prompts after 5pm & weekends Office Phone:  [phone] Office Fax:  [phone]

## 2019-06-19 ENCOUNTER — Other Ambulatory Visit: Payer: Self-pay | Admitting: Internal Medicine

## 2019-06-19 ENCOUNTER — Telehealth: Payer: Self-pay

## 2019-06-19 NOTE — Progress Notes (Signed)
Medications have been discontinued. 

## 2019-06-19 NOTE — Telephone Encounter (Signed)
Those two medications were prescribed by the NP at Mrs. Bandel's psychiatry office due to a diagnosis of bipolar disorder.  The depakote is a mood stabilizer and the zyprexa is an antipsychotic that would also help with sleep.  She did not get those two while here at [facility], but she was supposed to be taking them outpatient.  If she is not tolerating them well, they can be discontinued.  I thought she was doing ok without them here at [facility].  If Judson Roch would like to stop them, I am ok with that.

## 2019-06-19 NOTE — Telephone Encounter (Signed)
I discontinued them.

## 2019-06-19 NOTE — Telephone Encounter (Signed)
Patients daughter called and wanted to know why her mother is on Depakote ER 500 mg and Olanzapine 15 mg she said this ,medication is causing her mother to act strange and not like herself and that it had been prescribed by a NP   Before you but no one will tell her the reason she has to take these medication and would like for you to tell her why if you can because she doesn't feel as though her mother needs to be on theses medications as she is a [employer] scientist and can not find any reason for them her number is [phone] and her name is Judson Roch if you want to speak to her directly if not Please Advise

## 2019-06-19 NOTE — Telephone Encounter (Signed)
I spoke with patients daughter and discussed Dr.Reed's response. Rose [name] would like medications stopped and medication list updated

## 2019-06-25 ENCOUNTER — Ambulatory Visit (INDEPENDENT_AMBULATORY_CARE_PROVIDER_SITE_OTHER): Payer: Medicare Other | Admitting: Internal Medicine

## 2019-06-25 ENCOUNTER — Encounter: Payer: Self-pay | Admitting: Internal Medicine

## 2019-06-25 ENCOUNTER — Other Ambulatory Visit: Payer: Self-pay

## 2019-06-25 VITALS — BP 120/60 | HR 62 | Temp 98.7°F | Resp 16 | Ht 62.0 in | Wt 120.4 lb

## 2019-06-25 DIAGNOSIS — Z170 Estrogen receptor positive status [ER+]: Secondary | ICD-10-CM | POA: Diagnosis not present

## 2019-06-25 DIAGNOSIS — Z7189 Other specified counseling: Secondary | ICD-10-CM | POA: Diagnosis not present

## 2019-06-25 DIAGNOSIS — D509 Iron deficiency anemia, unspecified: Secondary | ICD-10-CM | POA: Diagnosis not present

## 2019-06-25 DIAGNOSIS — G3184 Mild cognitive impairment, so stated: Secondary | ICD-10-CM | POA: Diagnosis not present

## 2019-06-25 DIAGNOSIS — C50212 Malignant neoplasm of upper-inner quadrant of left female breast: Secondary | ICD-10-CM

## 2019-06-25 DIAGNOSIS — F909 Attention-deficit hyperactivity disorder, unspecified type: Secondary | ICD-10-CM

## 2019-06-25 DIAGNOSIS — F3130 Bipolar disorder, current episode depressed, mild or moderate severity, unspecified: Secondary | ICD-10-CM | POA: Diagnosis not present

## 2019-06-25 DIAGNOSIS — M5432 Sciatica, left side: Secondary | ICD-10-CM

## 2019-06-25 DIAGNOSIS — I701 Atherosclerosis of renal artery: Secondary | ICD-10-CM

## 2019-06-25 DIAGNOSIS — Z9189 Other specified personal risk factors, not elsewhere classified: Secondary | ICD-10-CM

## 2019-06-25 NOTE — Progress Notes (Signed)
Location:  [facility] clinic Provider:  Kaydee Magel L. Mariea Clonts, D.O., C.M.D.  Code Status: full code--she and her daughter are going to discuss this more at home--I explained to her the probability of survival at her advanced age, also the short and long-term outcomes of a resuscitation process and she did not like the idea of being on machines or kept alive artificially nor did she like the concept that her quality of life may be poor afterward requiring nursing home placement and dependence on others day to day  Goals of Care:  Advanced Directives 06/25/2019  Does Patient Have a Medical Advance Directive? No  Type of Advance Directive -  Does patient want to make changes to medical advance directive? No - Patient declined  Copy of [place] in Chart? -  Would patient like information on creating a medical advance directive? -     Chief Complaint  Patient presents with  . Follow-up    Release from [facility].     HPI: Patient is a 79 y.o. female seen today for follow-up after rehab stay at [facility] and to formally become an outpatient of [employer].  She established while at [facility] for rehab.   She does not have advance directives on file with [place] or with [facility].    Stopped zyprexa and depakote 6 days ago due to extreme lethargy similar to what took her to the ED.   She's never had hallucinations per her daughter who actually works in the [employer].  .    When her daughter had seen her at Christmas on meds from psych before, she was very sleepy.   She was diagnosed with bipolar later in life but was able to function.  It did not impact her life.  She may have been a bit paranoid.  The meds were knocking her out.  The symptoms recurred when those meds were resumed after rehab.  She was disoriented.    Rose [name] has been organizing her meds and triple checking them.  There were two days of meds left but she called her daughter last night out of meds (should have  still had enough for today).  She did not remember taking them or not.  A few mins later in the discussion, she reported she had only taken what she's supposed to take and was insisting that Rose [name] made an error.  Rose [name] notes that her mom's confusion is getting worse.    She has also had three falls already since returning to her community independent living apt in [place].   First time, she was going around a corner in a room, she slipped or something and landed on her coccyx.  It was sore for a few days.   Did the same thing again a couple days later.   There was a third one she does not recollect.   She's not locking her door at night.   Her family friend was stepping in and closing the door for her and did share that information with Rose [name] when she found the door unlocked recently.   She does not think she needs help with her meds or remembering appts, keeping doors locked, etc.  I discussed the dangers behind this and recommended AL level of care back at [facility] or a similar community locally or closer to [facility] in [place]. Wants to have her cats wherever she moves.  .  BP is usually good at home and great here today.    Bladder  control:  Doing better with vesicare and myrbetriq.  Says she is urinating when she wants to.  We opted to try to stop vesicare due to its slight anticholinergic effect which could worsen her cognition and keep myrbetriq.    BMs are normal, too. She was taking her iron 150 twice a day, but has done better with bms on qod iron.  She's not needing the lactulose like she did.  Doing melatonin 10mg  for sleep.  She had said she was taking 35mg  when I did her admission to [facility] rehab.  She is now on 10mg  and sleeping just fine.  We may even be able to reduce in the future.    Left sciatica pain is what lyrica is for.  Bothers her when she walks.   On MRI brain 3/12 [facility] ED:  She had mild chronic microvascular ischemic changes.  She also had  prominence of her ventricles indicating volume loss.  No metastatic disease.  Reviewed these findings as contributors to the memory loss she's experiencing and that the medications she was on, taking them improperly were worsening matters.  Past Medical History:  Diagnosis Date  . Abnormal blood pressure    left arm from old brachial artery repair  . Allergy   . Anemia   . Anxiety   . Atherosclerosis of renal artery ([place])   . Attention deficit disorder without mention of hyperactivity   . Basal cell carcinoma of face   . Benign heart murmur   . Blood transfusion without reported diagnosis   . Breast cancer ([place])   . Carotid artery disease ([place])   . Carotid bruit    Left  . Chronic bronchitis ([place])   . Depression   . DJD (degenerative joint disease) of hip   . Esophageal reflux   . History of depression   . History of spinal fusion   . Hypertension   . Morton's neuroma    Hx of, Left, 2 on right   . Myalgia and myositis, unspecified   . Other tenosynovitis of hand and wrist   . Pancreatitis    03-2018  . Peripheral neuropathy   . Personal history of alcoholism ([place])   . Personal history of peptic ulcer disease   . Pulmonary nodule   . Rotator cuff disorder    pain  . Solitary cyst of breast   . Subclavian steal syndrome   . Substance abuse ([place])   . Unspecified hypothyroidism   . Urinary incontinence   . Wears glasses     Past Surgical History:  Procedure Laterality Date  . APPENDECTOMY    . BREAST LUMPECTOMY WITH RADIOACTIVE SEED AND SENTINEL LYMPH NODE BIOPSY Left 02/20/2019   Procedure: LEFT BREAST LUMPECTOMY X2 WITH RADIOACTIVE SEED X2 AND SENTINEL LYMPH NODE MAPPING;  Surgeon: Erroll Luna, MD;  Location: [facility];  Service: General;  Laterality: Left;  . CARPOMETACARPEL SUSPENSION PLASTY Right 08/07/2013   Procedure: CARPOMETACARPEL [facility]) SUSPENSION PLASTY RIGHT THUMB;  Surgeon: Wynonia Sours, MD;  Location: [facility];  Service:  Orthopedics;  Laterality: Right;  . CARPOMETACARPEL SUSPENSION PLASTY Left 10/23/2015   Procedure: SUSPENSION PLASTY LEFT THUMB TRAPEZIUM EXCISION ABDUCTOR POLLICIS LONGUS TRANSFER;  Surgeon: Daryll Brod, MD;  Location: [facility];  Service: Orthopedics;  Laterality: Left;  . CERVICAL FUSION  2007   Dr. Valli Glance approach  . COLONOSCOPY    . JOINT REPLACEMENT Left   . Left brachial artery repair of pseudoaneurysm  2001   post a cath  . LUMBAR FUSION  09/2004   T12-L5 (Dr. Patrice Paradise)  . MINOR IRRIGATION AND DEBRIDEMENT OF WOUND Right 08/27/2014   Procedure: MINOR IRRIGATION AND DEBRIDEMENT OF WOUND;  Surgeon: Daryll Brod, MD;  Location: [facility];  Service: Orthopedics;  Laterality: Right;  . ORIF TIBIA FRACTURE  2010   Left distal  . Percutanous Transluminal Angioplasty of Renal Arteris    . REPAIR EXTENSOR TENDON Right 09/26/2014   Procedure: REPAIR EXTENSOR TENDON RIGHT RING FINGER ;  Surgeon: Daryll Brod, MD;  Location: [facility];  Service: Orthopedics;  Laterality: Right;  . SHOULDER ARTHROSCOPY  09/2008   Left  . TONSILLECTOMY AND ADENOIDECTOMY    . TOTAL HIP ARTHROPLASTY  2008   left  . WISDOM TOOTH EXTRACTION      Allergies  Allergen Reactions  . Amoxicillin     REACTION: Rash  DID THE REACTION INVOLVE: Swelling of the face/tongue/throat, SOB, or low BP? No Sudden or severe rash/hives, skin peeling, or the inside of the mouth or nose? Yes Did it require medical treatment? No When did it last happen?1990s If all above answers are "NO", may proceed with cephalosporin use.   . Codeine Hives  . Hydrocodone-Acetaminophen     REACTION: itching  . Hydromorphone Hcl   . Morphine And Related Hives and Itching  . Morphine Sulfate     REACTION: unspecified  . Nsaids     Ulcer    Outpatient Encounter Medications as of 06/25/2019  Medication Sig  . alendronate (FOSAMAX) 70 MG tablet Take with a full glass of water on an empty  stomach.  Marland Kitchen amLODipine (NORVASC) 5 MG tablet Take 1 tablet (5 mg total) by mouth in the morning and at bedtime. Take 5 mg PO 2 times aday  . aspirin 81 MG EC tablet Take 1 tablet (81 mg total) by mouth daily. Swallow whole.  Marland Kitchen atomoxetine (STRATTERA) 100 MG capsule Take 1 capsule (100 mg total) by mouth daily.  Marland Kitchen atorvastatin (LIPITOR) 40 MG tablet Take 1 tablet (40 mg total) by mouth daily.  . Cholecalciferol 25 MCG (1000 UT) capsule Take 1 capsule (1,000 Units total) by mouth daily.  . cloNIDine (CATAPRES) 0.1 MG tablet Take 1 tablet (0.1 mg total) by mouth 3 (three) times daily.  . DULoxetine (CYMBALTA) 60 MG capsule Take 1 capsule (60 mg total) by mouth daily.  . ferrous sulfate (SLOW IRON) 160 (50 Fe) MG TBCR SR tablet Take 1 tablet (160 mg total) by mouth every other day.  . Glucosamine 500 MG CAPS Take 1 capsule (500 mg total) by mouth daily.  Marland Kitchen lactulose (CHRONULAC) 10 GM/15ML solution Take 30 mLs (20 g total) by mouth daily as needed for mild constipation.  Marland Kitchen levothyroxine (SYNTHROID) 125 MCG tablet Take 1 tablet (125 mcg total) by mouth daily.  Marland Kitchen losartan (COZAAR) 100 MG tablet Take 1 tablet (100 mg total) by mouth daily.  . Melatonin 1 MG CAPS Take 3 capsules (3 mg total) by mouth daily as needed.  . mirabegron ER (MYRBETRIQ) 25 MG TB24 tablet Take 1 tablet (25 mg total) by mouth daily. Overactive bladder  . pregabalin (LYRICA) 150 MG capsule Take 1 capsule (150 mg total) by mouth 2 (two) times daily.  Marland Kitchen PREVIDENT 5000 BOOSTER PLUS 1.1 % PSTE daily.  . solifenacin (VESICARE) 10 MG tablet Take 1 tablet (10 mg total) by mouth daily.  Marland Kitchen triamcinolone cream (KENALOG) 0.1 % Apply 1 application topically 2 (  two) times daily.   No facility-administered encounter medications on file as of 06/25/2019.    Review of Systems:  Review of Systems  Constitutional: Negative for chills, fever and malaise/fatigue.  HENT: Negative for congestion and sore throat.   Eyes: Negative for blurred vision.        Reading glasses  Respiratory: Negative for cough and shortness of breath.   Cardiovascular: Negative for chest pain, palpitations and leg swelling.  Gastrointestinal: Negative for abdominal pain, blood in stool, constipation, diarrhea and melena.  Genitourinary: Negative for dysuria.       Had been having urgency and frequency with dx of OAB  Musculoskeletal: Positive for back pain and falls.  Neurological: Positive for tingling and sensory change. Negative for dizziness and loss of consciousness.       Left hip/thigh  Endo/Heme/Allergies: Bruises/bleeds easily.  Psychiatric/Behavioral: Positive for depression and memory loss. Negative for hallucinations and suicidal ideas. The patient is not nervous/anxious and does not have insomnia.        H/o alcohol abuse but quit a year ago Jan or early feb    Health Maintenance  Topic Date Due  . PNA vac Low Risk Adult (2 of 2 - PCV13) 11/28/2008  . INFLUENZA VACCINE  10/14/2019  . TETANUS/TDAP  11/26/2019  . DEXA SCAN  Completed    Physical Exam: Vitals:   06/25/19 0906  BP: 120/60  Pulse: 62  Resp: 16  Temp: 98.7 F (37.1 C)  SpO2: 96%  Weight: 120 lb 6.4 oz (54.6 kg)  Height: 5\' 2"  (1.575 m)   Body mass index is 22.02 kg/m. Physical Exam Vitals reviewed.  Constitutional:      General: She is not in acute distress.    Appearance: Normal appearance. She is not toxic-appearing.  Eyes:     Extraocular Movements: Extraocular movements intact.     Conjunctiva/sclera: Conjunctivae normal.     Pupils: Pupils are equal, round, and reactive to light.     Comments: Has reading glasses  Cardiovascular:     Rate and Rhythm: Normal rate and regular rhythm.     Pulses: Normal pulses.     Heart sounds: Normal heart sounds.  Pulmonary:     Effort: Pulmonary effort is normal.     Breath sounds: Normal breath sounds. No wheezing, rhonchi or rales.  Abdominal:     General: Bowel sounds are normal.     Palpations: Abdomen is soft.  There is no mass.     Tenderness: There is no abdominal tenderness. There is no guarding or rebound.  Neurological:     General: No focal deficit present.     Mental Status: She is alert. Mental status is at baseline.     Cranial Nerves: No cranial nerve deficit.     Gait: Gait abnormal.     Comments: Stooped posture walking with walker and says her coccyx is hurting  Psychiatric:     Comments: Flat affect, but pleasant and appropriate responses (except if incorrect due to poor recall)     Labs reviewed: Basic Metabolic Panel: Recent Labs    02/07/19 1430 03/07/19 1343 04/13/19 1505  NA 143 143 145  K 4.4 4.6 4.2  CL 104 108 110  CO2 28 28 29   GLUCOSE 123* 94 93  BUN 22 29* 37*  CREATININE 1.03* 0.92 0.90  CALCIUM 10.2 10.4* 10.8*   Liver Function Tests: Recent Labs    02/07/19 1430 03/07/19 1343 04/13/19 1505  AST 21 14* 11*  ALT 19 11 9   ALKPHOS 48 48 46  BILITOT 0.4 0.3 0.3  PROT 6.9 6.9 6.9  ALBUMIN 3.8 4.1 4.2   No results for input(s): LIPASE, AMYLASE in the last 8760 hours. No results for input(s): AMMONIA in the last 8760 hours. CBC: Recent Labs    02/07/19 1430 03/07/19 1343 04/13/19 1505  WBC 5.0 5.4 7.2  NEUTROABS 2.9 2.7 4.1  HGB 10.9* 10.7* 11.3*  HCT 33.9* 32.9* 34.4*  MCV 100.6* 99.1 100.0  PLT 160 181 176   Lipid Panel: No results for input(s): CHOL, HDL, LDLCALC, TRIG, CHOLHDL, LDLDIRECT in the last 8760 hours. Lab Results  Component Value Date   HGBA1C 5.9 06/22/2017    Procedures since last visit: MRI reviewed above  Assessment/Plan 1. Mild cognitive impairment with memory loss -seems this may be early dementia with her difficulty managing meds, locking the door, etc -recommend AL level of care at present  -avoid anticholinergic meds that will worsen cognitive status, avoid sedative hypnotics as well -redo MMSE at next appt after off the anticholinergic meds and on a stable regimen  2. Bipolar affective disorder, current  episode depressed, current episode severity unspecified ([place]) -depakote plus 15mg  (high dose) zyprexa was way too much for her and has been stopped -monitor for worsening symptoms -does not seem at all manic -appears still a bit depressed, apathetic -also continues on cymbalta as antidepressant which should help both mood and back pain  3. At risk for polypharmacy -we cleaned up her med list considerably during her [facility] stay -cont to work on this -she also had too many doctors prescribing and no one overseeing everything which I'm happy to do as long as I can get the notes from her specialists and she stays locally   4. Malignant neoplasm of upper-inner quadrant of left breast in female, estrogen receptor positive ([place]) -resume letrozole as previously taken before rehab admission for suspect TIA (that I believe was a medication overdose/polypharmacy) -f/u with oncology as planned -next due for bilateral mammogram in 10/21 -completed course of radiation after breast conserving therapy  5. Attention deficit hyperactivity disorder (ADHD), unspecified ADHD type -cont straterra per psych  6. Left sided sciatica -cont lyrica for this pain and use of walker to help posture (vs cane)  7. Iron deficiency anemia, unspecified iron deficiency anemia type -cont qod iron and recheck labs before I see her next - CBC with Differential/Platelet; Future - Iron, TIBC and Ferritin Panel; Future  8.  Advance care planning: 17 mins spent discussing code status, repercussions, qol, etc Patient was still not sure and wanted to keep full code and discuss more with her family She also has only HCPOA at home (we don't have copies), but no living will or MOST  Labs/tests ordered:   Lab Orders     CBC with Differential/Platelet     Iron, TIBC and Ferritin Panel  Next appt:  Visit date not found--daughter said they'd call for next appt (she may be moving)  Rose [name] L. Elison Worrel,  D.O. [facility] [address], [place] [address] Cell Phone (Mon-Fri 8am-5pm):  [phone] On Call:  [phone] & follow prompts after 5pm & weekends Office Phone:  [phone] Office Fax:  [phone]

## 2019-06-26 MED ORDER — LETROZOLE 2.5 MG PO TABS: 2.5000 mg | ORAL_TABLET | Freq: Every day | ORAL | 3 refills | Status: DC

## 2019-07-09 ENCOUNTER — Telehealth: Payer: Self-pay | Admitting: *Deleted

## 2019-07-09 NOTE — Telephone Encounter (Signed)
Dr. Mariea Clonts filled out FL2 form. Faxed to facility.  Sent copy to scanning.

## 2019-07-09 NOTE — Telephone Encounter (Signed)
Rose [name], daughter called and stated that they have found patient an [facility] in [place]. Needs an FL2 form filled out and faxed to  [facility]: Fax: [phone]  Daughter is trying to get patient moved in by next week.  I filled out an FL2 form with the medications and Diagnosis and placed in Dr. Cyndi Lennert folder to review, fill out and sign. To be faxed once completed.

## 2019-07-10 DIAGNOSIS — D2361 Other benign neoplasm of skin of right upper limb, including shoulder: Secondary | ICD-10-CM | POA: Diagnosis not present

## 2019-07-10 DIAGNOSIS — D225 Melanocytic nevi of trunk: Secondary | ICD-10-CM | POA: Diagnosis not present

## 2019-07-10 DIAGNOSIS — D2262 Melanocytic nevi of left upper limb, including shoulder: Secondary | ICD-10-CM | POA: Diagnosis not present

## 2019-07-10 DIAGNOSIS — L821 Other seborrheic keratosis: Secondary | ICD-10-CM | POA: Diagnosis not present

## 2019-07-10 DIAGNOSIS — D1801 Hemangioma of skin and subcutaneous tissue: Secondary | ICD-10-CM | POA: Diagnosis not present

## 2019-07-10 DIAGNOSIS — D2261 Melanocytic nevi of right upper limb, including shoulder: Secondary | ICD-10-CM | POA: Diagnosis not present

## 2019-07-10 NOTE — Telephone Encounter (Signed)
Dr. Mariea Clonts received paperwork from [facility] [phone]. FL2 form completed, Diet Clarification Form Completed, PRN Medication Authorization Letter Completed. Paperwork faxed to facility [phone]  Daughter stated that she is going to take patient to [facility] to perform the TB testing and she will send results to the facility.

## 2019-07-10 NOTE — Telephone Encounter (Signed)
Sounds great.  Thank you. 

## 2019-07-12 NOTE — Telephone Encounter (Signed)
That's fine.  That's how we filled it in at first until I got that fax from the facility.  I hope you kept the copy of the first one.

## 2019-07-12 NOTE — Telephone Encounter (Signed)
Done and returned to [facility]

## 2019-07-12 NOTE — Telephone Encounter (Signed)
Filled out new FL2 form and placed in folder for Dr. Mariea Clonts to review, fill out and sign.

## 2019-07-12 NOTE — Telephone Encounter (Signed)
Daughter, Clarise Cruz called and stated that she would like for the [facility] form changed to AL instead of Memory Care. Stated that she feels patient doesn't need Memory care, she feels she is doing better now that medications are managed correctly. Stated that patient is Lucid and Oriented now that the medication is managed correctly.  Please Advise.

## 2019-07-13 NOTE — Telephone Encounter (Signed)
Updated FL2 form faxed to facility.

## 2019-07-30 DIAGNOSIS — C50212 Malignant neoplasm of upper-inner quadrant of left female breast: Secondary | ICD-10-CM | POA: Diagnosis not present

## 2019-07-30 DIAGNOSIS — Z923 Personal history of irradiation: Secondary | ICD-10-CM | POA: Diagnosis not present

## 2019-07-30 DIAGNOSIS — Z9889 Other specified postprocedural states: Secondary | ICD-10-CM | POA: Diagnosis not present

## 2019-07-30 DIAGNOSIS — Z170 Estrogen receptor positive status [ER+]: Secondary | ICD-10-CM | POA: Diagnosis not present

## 2019-08-06 ENCOUNTER — Telehealth: Payer: Self-pay

## 2019-08-06 NOTE — Telephone Encounter (Signed)
I recommend they monitor her blood pressure daily one hour after her morning medications.  If readings are consistently over [id number] systolic (top number), then resume clonidine.  Do they plan to continue to follow-up here or will she be establishing with a PCP local to where she is staying now?

## 2019-08-06 NOTE — Telephone Encounter (Signed)
Patient's daughter, Clarise Cruz, called and said they have gotten Ms. Wikstrom moved to [place] and into assisted living. She says she took Ms. Griffin to go see her Oncologist today and her blood pressure was 145/65,and wanted to know if she should start taking the Clonidine,0.1 mg, tablet again.

## 2019-08-09 DIAGNOSIS — I959 Hypotension, unspecified: Secondary | ICD-10-CM | POA: Diagnosis not present

## 2019-08-09 DIAGNOSIS — K8000 Calculus of gallbladder with acute cholecystitis without obstruction: Secondary | ICD-10-CM | POA: Diagnosis not present

## 2019-08-09 DIAGNOSIS — Z85828 Personal history of other malignant neoplasm of skin: Secondary | ICD-10-CM | POA: Diagnosis not present

## 2019-08-09 DIAGNOSIS — K567 Ileus, unspecified: Secondary | ICD-10-CM | POA: Diagnosis not present

## 2019-08-09 DIAGNOSIS — K8010 Calculus of gallbladder with chronic cholecystitis without obstruction: Secondary | ICD-10-CM | POA: Diagnosis not present

## 2019-08-09 DIAGNOSIS — D509 Iron deficiency anemia, unspecified: Secondary | ICD-10-CM | POA: Diagnosis present

## 2019-08-09 DIAGNOSIS — R188 Other ascites: Secondary | ICD-10-CM | POA: Diagnosis not present

## 2019-08-09 DIAGNOSIS — R945 Abnormal results of liver function studies: Secondary | ICD-10-CM | POA: Diagnosis not present

## 2019-08-09 DIAGNOSIS — K861 Other chronic pancreatitis: Secondary | ICD-10-CM | POA: Diagnosis present

## 2019-08-09 DIAGNOSIS — R42 Dizziness and giddiness: Secondary | ICD-10-CM | POA: Diagnosis not present

## 2019-08-09 DIAGNOSIS — Z885 Allergy status to narcotic agent status: Secondary | ICD-10-CM | POA: Diagnosis not present

## 2019-08-09 DIAGNOSIS — R52 Pain, unspecified: Secondary | ICD-10-CM | POA: Diagnosis not present

## 2019-08-09 DIAGNOSIS — Z20822 Contact with and (suspected) exposure to covid-19: Secondary | ICD-10-CM | POA: Diagnosis present

## 2019-08-09 DIAGNOSIS — D539 Nutritional anemia, unspecified: Secondary | ICD-10-CM | POA: Diagnosis not present

## 2019-08-09 DIAGNOSIS — K859 Acute pancreatitis without necrosis or infection, unspecified: Secondary | ICD-10-CM | POA: Diagnosis not present

## 2019-08-09 DIAGNOSIS — R1084 Generalized abdominal pain: Secondary | ICD-10-CM | POA: Diagnosis not present

## 2019-08-09 DIAGNOSIS — Z7982 Long term (current) use of aspirin: Secondary | ICD-10-CM | POA: Diagnosis not present

## 2019-08-09 DIAGNOSIS — Z923 Personal history of irradiation: Secondary | ICD-10-CM | POA: Diagnosis not present

## 2019-08-09 DIAGNOSIS — K8020 Calculus of gallbladder without cholecystitis without obstruction: Secondary | ICD-10-CM | POA: Diagnosis not present

## 2019-08-09 DIAGNOSIS — R739 Hyperglycemia, unspecified: Secondary | ICD-10-CM | POA: Diagnosis not present

## 2019-08-09 DIAGNOSIS — I10 Essential (primary) hypertension: Secondary | ICD-10-CM | POA: Diagnosis not present

## 2019-08-09 DIAGNOSIS — K828 Other specified diseases of gallbladder: Secondary | ICD-10-CM | POA: Diagnosis not present

## 2019-08-09 DIAGNOSIS — R0902 Hypoxemia: Secondary | ICD-10-CM | POA: Diagnosis not present

## 2019-08-09 DIAGNOSIS — Z9049 Acquired absence of other specified parts of digestive tract: Secondary | ICD-10-CM | POA: Diagnosis not present

## 2019-08-09 DIAGNOSIS — R109 Unspecified abdominal pain: Secondary | ICD-10-CM | POA: Diagnosis not present

## 2019-08-09 DIAGNOSIS — Z8659 Personal history of other mental and behavioral disorders: Secondary | ICD-10-CM | POA: Diagnosis not present

## 2019-08-09 DIAGNOSIS — Z853 Personal history of malignant neoplasm of breast: Secondary | ICD-10-CM | POA: Diagnosis not present

## 2019-08-09 DIAGNOSIS — K851 Biliary acute pancreatitis without necrosis or infection: Secondary | ICD-10-CM | POA: Diagnosis not present

## 2019-08-09 NOTE — Telephone Encounter (Signed)
Result given Judson Roch and they will be  establishing care with a PCP but right now she is in the hospital with pancreatitis.

## 2019-08-14 ENCOUNTER — Telehealth: Payer: Self-pay | Admitting: *Deleted

## 2019-08-14 NOTE — Telephone Encounter (Signed)
Daughter, Judson Roch called with Blood Pressure Readings: 08/09/19-  192/67 Pulse 73 08/12/19-  189/72 08/13/19-  179/66 Pulse 68 08/14/19-    168/69 08/14/19-    193/90 Pulse 77  Stated that these are taken an hour after medications. Requesting to go back on her Clonidine .1mg  three times daily.  Please Advise.

## 2019-08-14 NOTE — Telephone Encounter (Signed)
Agree with resuming clonidine 0.1mg  po tid for hypertension.  Please sent in to her pharmacy of choice.

## 2019-08-14 NOTE — Telephone Encounter (Signed)
Daughter notified and stated that patient does not need a Rx at this time. Stated that she has a whole bottle from before.   Medication list updated.

## 2019-08-22 DIAGNOSIS — Z853 Personal history of malignant neoplasm of breast: Secondary | ICD-10-CM | POA: Diagnosis not present

## 2019-08-22 DIAGNOSIS — D509 Iron deficiency anemia, unspecified: Secondary | ICD-10-CM | POA: Diagnosis not present

## 2019-08-22 DIAGNOSIS — K859 Acute pancreatitis without necrosis or infection, unspecified: Secondary | ICD-10-CM | POA: Diagnosis not present

## 2019-08-24 DIAGNOSIS — J90 Pleural effusion, not elsewhere classified: Secondary | ICD-10-CM | POA: Diagnosis not present

## 2019-08-24 DIAGNOSIS — K851 Biliary acute pancreatitis without necrosis or infection: Secondary | ICD-10-CM | POA: Diagnosis not present

## 2019-08-24 DIAGNOSIS — K660 Peritoneal adhesions (postprocedural) (postinfection): Secondary | ICD-10-CM | POA: Diagnosis not present

## 2019-10-01 ENCOUNTER — Telehealth: Payer: Self-pay | Admitting: *Deleted

## 2019-10-01 ENCOUNTER — Other Ambulatory Visit: Payer: Self-pay | Admitting: *Deleted

## 2019-10-01 DIAGNOSIS — C50212 Malignant neoplasm of upper-inner quadrant of left female breast: Secondary | ICD-10-CM

## 2019-10-01 DIAGNOSIS — Z170 Estrogen receptor positive status [ER+]: Secondary | ICD-10-CM

## 2019-10-01 MED ORDER — LETROZOLE 2.5 MG PO TABS: 2.5000 mg | ORAL_TABLET | Freq: Every day | ORAL | 3 refills | Status: DC

## 2019-10-01 NOTE — Telephone Encounter (Signed)
Message received from patient's daughter, Clarise Cruz wanting to know if patient should continue Letrozole.  Call placed back to Clarise Cruz and Clarise Cruz notified per order of Dr. Marin Olp for pt to continue Letrozole as prescribed.  Clarise Cruz states that pt currently resides in an assisted living facility in [place].  New address given for pt.'s chart.  Message sent to scheduling to reschedule pt.'s f/u and refill for [facility]ole sent [facility] [place] per Sara's request.  Clarise Cruz is appreciative of call back and has no further questions at this time.

## 2019-10-15 ENCOUNTER — Other Ambulatory Visit: Payer: Self-pay | Admitting: *Deleted

## 2019-10-15 DIAGNOSIS — D509 Iron deficiency anemia, unspecified: Secondary | ICD-10-CM | POA: Diagnosis not present

## 2019-10-15 DIAGNOSIS — N390 Urinary tract infection, site not specified: Secondary | ICD-10-CM | POA: Diagnosis not present

## 2019-10-15 DIAGNOSIS — Z1211 Encounter for screening for malignant neoplasm of colon: Secondary | ICD-10-CM | POA: Diagnosis not present

## 2019-10-15 MED ORDER — PREGABALIN 150 MG PO CAPS: 150.0000 mg | ORAL_CAPSULE | Freq: Two times a day (BID) | ORAL | 0 refills | Status: AC

## 2019-10-15 MED ORDER — DULOXETINE HCL 60 MG PO CPEP: 60.0000 mg | ORAL_CAPSULE | Freq: Every day | ORAL | 0 refills | Status: AC

## 2019-10-15 NOTE — Telephone Encounter (Signed)
Daughter is aware and is actively seeking another PCP.

## 2019-10-15 NOTE — Telephone Encounter (Signed)
Patient daughter called requesting refills. Stated that patient has moved to [facility] to [place] but has not found a Primary Dr. Yet and needs refills.   Pended and sent to Dr. Mariea Clonts for approval.

## 2019-10-15 NOTE — Telephone Encounter (Signed)
Ok to fill for 30 days.  Notify her daughter that they need to seek out a primary b/c I'm not comfortable filling meds ongoing without seeing her mother.  Thanks.

## 2019-10-30 DIAGNOSIS — F988 Other specified behavioral and emotional disorders with onset usually occurring in childhood and adolescence: Secondary | ICD-10-CM | POA: Diagnosis not present

## 2019-10-30 DIAGNOSIS — R0989 Other specified symptoms and signs involving the circulatory and respiratory systems: Secondary | ICD-10-CM | POA: Diagnosis not present

## 2019-10-30 DIAGNOSIS — Z853 Personal history of malignant neoplasm of breast: Secondary | ICD-10-CM | POA: Diagnosis not present

## 2019-10-30 DIAGNOSIS — E7800 Pure hypercholesterolemia, unspecified: Secondary | ICD-10-CM | POA: Diagnosis not present

## 2019-10-30 DIAGNOSIS — R011 Cardiac murmur, unspecified: Secondary | ICD-10-CM | POA: Diagnosis not present

## 2019-10-30 DIAGNOSIS — E039 Hypothyroidism, unspecified: Secondary | ICD-10-CM | POA: Diagnosis not present

## 2019-10-30 DIAGNOSIS — M810 Age-related osteoporosis without current pathological fracture: Secondary | ICD-10-CM | POA: Diagnosis not present

## 2019-10-30 DIAGNOSIS — D508 Other iron deficiency anemias: Secondary | ICD-10-CM | POA: Diagnosis not present

## 2019-10-30 DIAGNOSIS — N3941 Urge incontinence: Secondary | ICD-10-CM | POA: Diagnosis not present

## 2019-10-30 DIAGNOSIS — E559 Vitamin D deficiency, unspecified: Secondary | ICD-10-CM | POA: Diagnosis not present

## 2019-10-30 DIAGNOSIS — I10 Essential (primary) hypertension: Secondary | ICD-10-CM | POA: Diagnosis not present

## 2019-10-31 ENCOUNTER — Telehealth: Payer: Self-pay | Admitting: Hematology & Oncology

## 2019-10-31 NOTE — Telephone Encounter (Signed)
I've called patient and LMVM for her daughter to call back to schedule.  She has not returned any of my messages since 7/19

## 2019-11-12 DIAGNOSIS — D508 Other iron deficiency anemias: Secondary | ICD-10-CM | POA: Diagnosis not present

## 2019-11-12 DIAGNOSIS — R0989 Other specified symptoms and signs involving the circulatory and respiratory systems: Secondary | ICD-10-CM | POA: Diagnosis not present

## 2019-11-12 DIAGNOSIS — E7800 Pure hypercholesterolemia, unspecified: Secondary | ICD-10-CM | POA: Diagnosis not present

## 2019-11-12 DIAGNOSIS — I7789 Other specified disorders of arteries and arterioles: Secondary | ICD-10-CM | POA: Diagnosis not present

## 2019-11-27 DIAGNOSIS — E538 Deficiency of other specified B group vitamins: Secondary | ICD-10-CM | POA: Diagnosis not present

## 2019-11-27 DIAGNOSIS — D649 Anemia, unspecified: Secondary | ICD-10-CM | POA: Diagnosis not present

## 2019-11-28 DIAGNOSIS — M810 Age-related osteoporosis without current pathological fracture: Secondary | ICD-10-CM | POA: Diagnosis not present

## 2019-11-28 DIAGNOSIS — I10 Essential (primary) hypertension: Secondary | ICD-10-CM | POA: Diagnosis not present

## 2019-11-28 DIAGNOSIS — E7800 Pure hypercholesterolemia, unspecified: Secondary | ICD-10-CM | POA: Diagnosis not present

## 2019-11-28 DIAGNOSIS — F988 Other specified behavioral and emotional disorders with onset usually occurring in childhood and adolescence: Secondary | ICD-10-CM | POA: Diagnosis not present

## 2019-11-28 DIAGNOSIS — E039 Hypothyroidism, unspecified: Secondary | ICD-10-CM | POA: Diagnosis not present

## 2019-11-28 DIAGNOSIS — E559 Vitamin D deficiency, unspecified: Secondary | ICD-10-CM | POA: Diagnosis not present

## 2019-11-28 DIAGNOSIS — N3941 Urge incontinence: Secondary | ICD-10-CM | POA: Diagnosis not present

## 2019-12-03 DIAGNOSIS — I6529 Occlusion and stenosis of unspecified carotid artery: Secondary | ICD-10-CM | POA: Diagnosis not present

## 2019-12-03 DIAGNOSIS — I6523 Occlusion and stenosis of bilateral carotid arteries: Secondary | ICD-10-CM | POA: Diagnosis not present

## 2019-12-12 DIAGNOSIS — I6523 Occlusion and stenosis of bilateral carotid arteries: Secondary | ICD-10-CM | POA: Diagnosis not present

## 2019-12-17 DIAGNOSIS — S92353A Displaced fracture of fifth metatarsal bone, unspecified foot, initial encounter for closed fracture: Secondary | ICD-10-CM | POA: Diagnosis not present

## 2019-12-17 DIAGNOSIS — S92352A Displaced fracture of fifth metatarsal bone, left foot, initial encounter for closed fracture: Secondary | ICD-10-CM | POA: Diagnosis not present

## 2019-12-21 DIAGNOSIS — S99922A Unspecified injury of left foot, initial encounter: Secondary | ICD-10-CM | POA: Diagnosis not present

## 2019-12-21 DIAGNOSIS — S92355A Nondisplaced fracture of fifth metatarsal bone, left foot, initial encounter for closed fracture: Secondary | ICD-10-CM | POA: Diagnosis not present

## 2019-12-26 DIAGNOSIS — I10 Essential (primary) hypertension: Secondary | ICD-10-CM | POA: Diagnosis not present

## 2019-12-26 DIAGNOSIS — M25511 Pain in right shoulder: Secondary | ICD-10-CM | POA: Diagnosis not present

## 2019-12-26 DIAGNOSIS — I6521 Occlusion and stenosis of right carotid artery: Secondary | ICD-10-CM | POA: Diagnosis not present

## 2019-12-26 DIAGNOSIS — F988 Other specified behavioral and emotional disorders with onset usually occurring in childhood and adolescence: Secondary | ICD-10-CM | POA: Diagnosis not present

## 2019-12-26 DIAGNOSIS — Z853 Personal history of malignant neoplasm of breast: Secondary | ICD-10-CM | POA: Diagnosis not present

## 2019-12-26 DIAGNOSIS — E559 Vitamin D deficiency, unspecified: Secondary | ICD-10-CM | POA: Diagnosis not present

## 2019-12-26 DIAGNOSIS — M810 Age-related osteoporosis without current pathological fracture: Secondary | ICD-10-CM | POA: Diagnosis not present

## 2019-12-26 DIAGNOSIS — N3941 Urge incontinence: Secondary | ICD-10-CM | POA: Diagnosis not present

## 2019-12-26 DIAGNOSIS — E039 Hypothyroidism, unspecified: Secondary | ICD-10-CM | POA: Diagnosis not present

## 2019-12-26 DIAGNOSIS — I708 Atherosclerosis of other arteries: Secondary | ICD-10-CM | POA: Diagnosis not present

## 2019-12-26 DIAGNOSIS — L989 Disorder of the skin and subcutaneous tissue, unspecified: Secondary | ICD-10-CM | POA: Diagnosis not present

## 2019-12-26 DIAGNOSIS — E7800 Pure hypercholesterolemia, unspecified: Secondary | ICD-10-CM | POA: Diagnosis not present

## 2019-12-28 ENCOUNTER — Other Ambulatory Visit: Payer: Self-pay | Admitting: Hematology & Oncology

## 2019-12-28 DIAGNOSIS — R300 Dysuria: Secondary | ICD-10-CM | POA: Diagnosis not present

## 2019-12-28 DIAGNOSIS — Z170 Estrogen receptor positive status [ER+]: Secondary | ICD-10-CM

## 2019-12-28 DIAGNOSIS — C50212 Malignant neoplasm of upper-inner quadrant of left female breast: Secondary | ICD-10-CM

## 2019-12-28 DIAGNOSIS — R197 Diarrhea, unspecified: Secondary | ICD-10-CM | POA: Diagnosis not present

## 2020-01-01 ENCOUNTER — Other Ambulatory Visit: Payer: Self-pay | Admitting: Hematology & Oncology

## 2020-01-01 DIAGNOSIS — C50212 Malignant neoplasm of upper-inner quadrant of left female breast: Secondary | ICD-10-CM

## 2020-01-01 DIAGNOSIS — Z170 Estrogen receptor positive status [ER+]: Secondary | ICD-10-CM

## 2020-01-05 DIAGNOSIS — M25512 Pain in left shoulder: Secondary | ICD-10-CM | POA: Diagnosis not present

## 2020-01-08 ENCOUNTER — Other Ambulatory Visit: Payer: Self-pay | Admitting: Radiation Oncology

## 2020-01-08 DIAGNOSIS — Z9889 Other specified postprocedural states: Secondary | ICD-10-CM

## 2020-01-09 DIAGNOSIS — N3941 Urge incontinence: Secondary | ICD-10-CM | POA: Diagnosis not present

## 2020-01-09 DIAGNOSIS — M25511 Pain in right shoulder: Secondary | ICD-10-CM | POA: Diagnosis not present

## 2020-01-09 DIAGNOSIS — M25512 Pain in left shoulder: Secondary | ICD-10-CM | POA: Diagnosis not present

## 2020-01-29 DIAGNOSIS — S62347D Nondisplaced fracture of base of fifth metacarpal bone. left hand, subsequent encounter for fracture with routine healing: Secondary | ICD-10-CM | POA: Diagnosis not present

## 2020-01-29 DIAGNOSIS — S92355D Nondisplaced fracture of fifth metatarsal bone, left foot, subsequent encounter for fracture with routine healing: Secondary | ICD-10-CM | POA: Diagnosis not present

## 2020-02-13 DIAGNOSIS — Z853 Personal history of malignant neoplasm of breast: Secondary | ICD-10-CM | POA: Diagnosis not present

## 2020-02-19 DIAGNOSIS — S92355D Nondisplaced fracture of fifth metatarsal bone, left foot, subsequent encounter for fracture with routine healing: Secondary | ICD-10-CM | POA: Diagnosis not present

## 2020-03-03 DIAGNOSIS — R011 Cardiac murmur, unspecified: Secondary | ICD-10-CM | POA: Diagnosis not present

## 2020-03-12 DIAGNOSIS — Z23 Encounter for immunization: Secondary | ICD-10-CM | POA: Diagnosis not present

## 2020-05-05 ENCOUNTER — Encounter: Payer: Self-pay | Admitting: Internal Medicine

## 2020-05-07 DIAGNOSIS — M818 Other osteoporosis without current pathological fracture: Secondary | ICD-10-CM | POA: Diagnosis not present

## 2020-05-07 DIAGNOSIS — N3941 Urge incontinence: Secondary | ICD-10-CM | POA: Diagnosis not present

## 2020-05-07 DIAGNOSIS — R0689 Other abnormalities of breathing: Secondary | ICD-10-CM | POA: Diagnosis not present

## 2020-05-07 DIAGNOSIS — F988 Other specified behavioral and emotional disorders with onset usually occurring in childhood and adolescence: Secondary | ICD-10-CM | POA: Diagnosis not present

## 2020-05-07 DIAGNOSIS — I6521 Occlusion and stenosis of right carotid artery: Secondary | ICD-10-CM | POA: Diagnosis not present

## 2020-05-07 DIAGNOSIS — E039 Hypothyroidism, unspecified: Secondary | ICD-10-CM | POA: Diagnosis not present

## 2020-05-07 DIAGNOSIS — I10 Essential (primary) hypertension: Secondary | ICD-10-CM | POA: Diagnosis not present

## 2020-05-07 DIAGNOSIS — E7800 Pure hypercholesterolemia, unspecified: Secondary | ICD-10-CM | POA: Diagnosis not present

## 2020-05-07 DIAGNOSIS — E559 Vitamin D deficiency, unspecified: Secondary | ICD-10-CM | POA: Diagnosis not present

## 2020-05-07 DIAGNOSIS — M5489 Other dorsalgia: Secondary | ICD-10-CM | POA: Diagnosis not present

## 2020-05-09 DIAGNOSIS — Z87891 Personal history of nicotine dependence: Secondary | ICD-10-CM | POA: Diagnosis not present

## 2020-05-12 DIAGNOSIS — I10 Essential (primary) hypertension: Secondary | ICD-10-CM | POA: Diagnosis not present

## 2020-05-12 DIAGNOSIS — M25511 Pain in right shoulder: Secondary | ICD-10-CM | POA: Diagnosis not present

## 2020-05-12 DIAGNOSIS — N3941 Urge incontinence: Secondary | ICD-10-CM | POA: Diagnosis not present

## 2020-05-12 DIAGNOSIS — Z87891 Personal history of nicotine dependence: Secondary | ICD-10-CM | POA: Diagnosis not present

## 2020-05-12 DIAGNOSIS — E039 Hypothyroidism, unspecified: Secondary | ICD-10-CM | POA: Diagnosis not present

## 2020-05-12 DIAGNOSIS — M25512 Pain in left shoulder: Secondary | ICD-10-CM | POA: Diagnosis not present

## 2020-05-12 DIAGNOSIS — F900 Attention-deficit hyperactivity disorder, predominantly inattentive type: Secondary | ICD-10-CM | POA: Diagnosis not present

## 2020-05-12 DIAGNOSIS — I6521 Occlusion and stenosis of right carotid artery: Secondary | ICD-10-CM | POA: Diagnosis not present

## 2020-05-12 DIAGNOSIS — M818 Other osteoporosis without current pathological fracture: Secondary | ICD-10-CM | POA: Diagnosis not present

## 2020-05-12 DIAGNOSIS — E7800 Pure hypercholesterolemia, unspecified: Secondary | ICD-10-CM | POA: Diagnosis not present

## 2020-05-12 DIAGNOSIS — Z9181 History of falling: Secondary | ICD-10-CM | POA: Diagnosis not present

## 2020-05-12 DIAGNOSIS — E559 Vitamin D deficiency, unspecified: Secondary | ICD-10-CM | POA: Diagnosis not present

## 2020-05-14 DIAGNOSIS — M818 Other osteoporosis without current pathological fracture: Secondary | ICD-10-CM | POA: Diagnosis not present

## 2020-05-14 DIAGNOSIS — I6521 Occlusion and stenosis of right carotid artery: Secondary | ICD-10-CM | POA: Diagnosis not present

## 2020-05-14 DIAGNOSIS — I10 Essential (primary) hypertension: Secondary | ICD-10-CM | POA: Diagnosis not present

## 2020-05-14 DIAGNOSIS — E7800 Pure hypercholesterolemia, unspecified: Secondary | ICD-10-CM | POA: Diagnosis not present

## 2020-05-14 DIAGNOSIS — M25512 Pain in left shoulder: Secondary | ICD-10-CM | POA: Diagnosis not present

## 2020-05-14 DIAGNOSIS — M25511 Pain in right shoulder: Secondary | ICD-10-CM | POA: Diagnosis not present

## 2020-05-20 DIAGNOSIS — M818 Other osteoporosis without current pathological fracture: Secondary | ICD-10-CM | POA: Diagnosis not present

## 2020-05-20 DIAGNOSIS — M25511 Pain in right shoulder: Secondary | ICD-10-CM | POA: Diagnosis not present

## 2020-05-20 DIAGNOSIS — M25512 Pain in left shoulder: Secondary | ICD-10-CM | POA: Diagnosis not present

## 2020-05-20 DIAGNOSIS — E7800 Pure hypercholesterolemia, unspecified: Secondary | ICD-10-CM | POA: Diagnosis not present

## 2020-05-20 DIAGNOSIS — I6521 Occlusion and stenosis of right carotid artery: Secondary | ICD-10-CM | POA: Diagnosis not present

## 2020-05-20 DIAGNOSIS — I10 Essential (primary) hypertension: Secondary | ICD-10-CM | POA: Diagnosis not present

## 2020-05-21 DIAGNOSIS — E7800 Pure hypercholesterolemia, unspecified: Secondary | ICD-10-CM | POA: Diagnosis not present

## 2020-05-21 DIAGNOSIS — I10 Essential (primary) hypertension: Secondary | ICD-10-CM | POA: Diagnosis not present

## 2020-05-21 DIAGNOSIS — R202 Paresthesia of skin: Secondary | ICD-10-CM | POA: Diagnosis not present

## 2020-05-21 DIAGNOSIS — R0989 Other specified symptoms and signs involving the circulatory and respiratory systems: Secondary | ICD-10-CM | POA: Diagnosis not present

## 2020-05-21 DIAGNOSIS — E559 Vitamin D deficiency, unspecified: Secondary | ICD-10-CM | POA: Diagnosis not present

## 2020-05-21 DIAGNOSIS — E039 Hypothyroidism, unspecified: Secondary | ICD-10-CM | POA: Diagnosis not present

## 2020-05-21 DIAGNOSIS — M818 Other osteoporosis without current pathological fracture: Secondary | ICD-10-CM | POA: Diagnosis not present

## 2020-05-22 DIAGNOSIS — M25512 Pain in left shoulder: Secondary | ICD-10-CM | POA: Diagnosis not present

## 2020-05-22 DIAGNOSIS — M25511 Pain in right shoulder: Secondary | ICD-10-CM | POA: Diagnosis not present

## 2020-05-22 DIAGNOSIS — M818 Other osteoporosis without current pathological fracture: Secondary | ICD-10-CM | POA: Diagnosis not present

## 2020-05-22 DIAGNOSIS — E7800 Pure hypercholesterolemia, unspecified: Secondary | ICD-10-CM | POA: Diagnosis not present

## 2020-05-22 DIAGNOSIS — I10 Essential (primary) hypertension: Secondary | ICD-10-CM | POA: Diagnosis not present

## 2020-05-22 DIAGNOSIS — I6521 Occlusion and stenosis of right carotid artery: Secondary | ICD-10-CM | POA: Diagnosis not present

## 2020-05-23 DIAGNOSIS — I70213 Atherosclerosis of native arteries of extremities with intermittent claudication, bilateral legs: Secondary | ICD-10-CM | POA: Diagnosis not present

## 2020-05-23 DIAGNOSIS — R0989 Other specified symptoms and signs involving the circulatory and respiratory systems: Secondary | ICD-10-CM | POA: Diagnosis not present

## 2020-05-27 DIAGNOSIS — M25512 Pain in left shoulder: Secondary | ICD-10-CM | POA: Diagnosis not present

## 2020-05-27 DIAGNOSIS — I10 Essential (primary) hypertension: Secondary | ICD-10-CM | POA: Diagnosis not present

## 2020-05-27 DIAGNOSIS — E7800 Pure hypercholesterolemia, unspecified: Secondary | ICD-10-CM | POA: Diagnosis not present

## 2020-05-27 DIAGNOSIS — M818 Other osteoporosis without current pathological fracture: Secondary | ICD-10-CM | POA: Diagnosis not present

## 2020-05-27 DIAGNOSIS — M25511 Pain in right shoulder: Secondary | ICD-10-CM | POA: Diagnosis not present

## 2020-05-27 DIAGNOSIS — I6521 Occlusion and stenosis of right carotid artery: Secondary | ICD-10-CM | POA: Diagnosis not present

## 2020-05-29 DIAGNOSIS — M25511 Pain in right shoulder: Secondary | ICD-10-CM | POA: Diagnosis not present

## 2020-05-29 DIAGNOSIS — M25512 Pain in left shoulder: Secondary | ICD-10-CM | POA: Diagnosis not present

## 2020-05-29 DIAGNOSIS — I10 Essential (primary) hypertension: Secondary | ICD-10-CM | POA: Diagnosis not present

## 2020-05-29 DIAGNOSIS — I6521 Occlusion and stenosis of right carotid artery: Secondary | ICD-10-CM | POA: Diagnosis not present

## 2020-05-29 DIAGNOSIS — M818 Other osteoporosis without current pathological fracture: Secondary | ICD-10-CM | POA: Diagnosis not present

## 2020-05-29 DIAGNOSIS — E7800 Pure hypercholesterolemia, unspecified: Secondary | ICD-10-CM | POA: Diagnosis not present

## 2020-05-30 DIAGNOSIS — E7800 Pure hypercholesterolemia, unspecified: Secondary | ICD-10-CM | POA: Diagnosis not present

## 2020-05-30 DIAGNOSIS — R202 Paresthesia of skin: Secondary | ICD-10-CM | POA: Diagnosis not present

## 2020-05-30 DIAGNOSIS — M818 Other osteoporosis without current pathological fracture: Secondary | ICD-10-CM | POA: Diagnosis not present

## 2020-05-30 DIAGNOSIS — R0989 Other specified symptoms and signs involving the circulatory and respiratory systems: Secondary | ICD-10-CM | POA: Diagnosis not present

## 2020-05-30 DIAGNOSIS — I10 Essential (primary) hypertension: Secondary | ICD-10-CM | POA: Diagnosis not present

## 2020-05-30 DIAGNOSIS — E559 Vitamin D deficiency, unspecified: Secondary | ICD-10-CM | POA: Diagnosis not present

## 2020-05-30 DIAGNOSIS — E039 Hypothyroidism, unspecified: Secondary | ICD-10-CM | POA: Diagnosis not present

## 2020-06-03 DIAGNOSIS — E7800 Pure hypercholesterolemia, unspecified: Secondary | ICD-10-CM | POA: Diagnosis not present

## 2020-06-03 DIAGNOSIS — M25512 Pain in left shoulder: Secondary | ICD-10-CM | POA: Diagnosis not present

## 2020-06-03 DIAGNOSIS — M25511 Pain in right shoulder: Secondary | ICD-10-CM | POA: Diagnosis not present

## 2020-06-03 DIAGNOSIS — M818 Other osteoporosis without current pathological fracture: Secondary | ICD-10-CM | POA: Diagnosis not present

## 2020-06-03 DIAGNOSIS — I6521 Occlusion and stenosis of right carotid artery: Secondary | ICD-10-CM | POA: Diagnosis not present

## 2020-06-03 DIAGNOSIS — I10 Essential (primary) hypertension: Secondary | ICD-10-CM | POA: Diagnosis not present

## 2020-06-04 DIAGNOSIS — M818 Other osteoporosis without current pathological fracture: Secondary | ICD-10-CM | POA: Diagnosis not present

## 2020-06-04 DIAGNOSIS — Z853 Personal history of malignant neoplasm of breast: Secondary | ICD-10-CM | POA: Diagnosis not present

## 2020-06-04 DIAGNOSIS — N644 Mastodynia: Secondary | ICD-10-CM | POA: Diagnosis not present

## 2020-06-04 DIAGNOSIS — Z0000 Encounter for general adult medical examination without abnormal findings: Secondary | ICD-10-CM | POA: Diagnosis not present

## 2020-06-05 DIAGNOSIS — M25512 Pain in left shoulder: Secondary | ICD-10-CM | POA: Diagnosis not present

## 2020-06-05 DIAGNOSIS — M818 Other osteoporosis without current pathological fracture: Secondary | ICD-10-CM | POA: Diagnosis not present

## 2020-06-10 ENCOUNTER — Other Ambulatory Visit: Payer: Self-pay | Admitting: Internal Medicine

## 2020-06-10 DIAGNOSIS — I10 Essential (primary) hypertension: Secondary | ICD-10-CM | POA: Diagnosis not present

## 2020-06-10 DIAGNOSIS — I6521 Occlusion and stenosis of right carotid artery: Secondary | ICD-10-CM | POA: Diagnosis not present

## 2020-06-10 DIAGNOSIS — M25512 Pain in left shoulder: Secondary | ICD-10-CM | POA: Diagnosis not present

## 2020-06-10 DIAGNOSIS — E7800 Pure hypercholesterolemia, unspecified: Secondary | ICD-10-CM | POA: Diagnosis not present

## 2020-06-10 DIAGNOSIS — M818 Other osteoporosis without current pathological fracture: Secondary | ICD-10-CM | POA: Diagnosis not present

## 2020-06-10 DIAGNOSIS — M25511 Pain in right shoulder: Secondary | ICD-10-CM | POA: Diagnosis not present

## 2020-06-11 DIAGNOSIS — Z853 Personal history of malignant neoplasm of breast: Secondary | ICD-10-CM | POA: Diagnosis not present

## 2020-06-11 DIAGNOSIS — R202 Paresthesia of skin: Secondary | ICD-10-CM | POA: Diagnosis not present

## 2020-06-11 DIAGNOSIS — I6521 Occlusion and stenosis of right carotid artery: Secondary | ICD-10-CM | POA: Diagnosis not present

## 2020-06-11 DIAGNOSIS — I10 Essential (primary) hypertension: Secondary | ICD-10-CM | POA: Diagnosis not present

## 2020-06-11 DIAGNOSIS — M810 Age-related osteoporosis without current pathological fracture: Secondary | ICD-10-CM | POA: Diagnosis not present

## 2020-06-11 DIAGNOSIS — I739 Peripheral vascular disease, unspecified: Secondary | ICD-10-CM | POA: Diagnosis not present

## 2020-06-11 DIAGNOSIS — E7800 Pure hypercholesterolemia, unspecified: Secondary | ICD-10-CM | POA: Diagnosis not present

## 2020-06-11 DIAGNOSIS — E039 Hypothyroidism, unspecified: Secondary | ICD-10-CM | POA: Diagnosis not present

## 2020-06-14 ENCOUNTER — Other Ambulatory Visit: Payer: Self-pay | Admitting: Internal Medicine

## 2020-06-17 DIAGNOSIS — M25512 Pain in left shoulder: Secondary | ICD-10-CM | POA: Diagnosis not present

## 2020-06-17 DIAGNOSIS — M818 Other osteoporosis without current pathological fracture: Secondary | ICD-10-CM | POA: Diagnosis not present

## 2020-06-17 DIAGNOSIS — I10 Essential (primary) hypertension: Secondary | ICD-10-CM | POA: Diagnosis not present

## 2020-06-17 DIAGNOSIS — M25511 Pain in right shoulder: Secondary | ICD-10-CM | POA: Diagnosis not present

## 2020-06-26 DIAGNOSIS — M542 Cervicalgia: Secondary | ICD-10-CM | POA: Diagnosis not present

## 2020-06-26 DIAGNOSIS — M5033 Other cervical disc degeneration, cervicothoracic region: Secondary | ICD-10-CM | POA: Diagnosis not present

## 2020-06-28 ENCOUNTER — Other Ambulatory Visit: Payer: Self-pay | Admitting: Internal Medicine

## 2020-06-29 ENCOUNTER — Other Ambulatory Visit: Payer: Self-pay | Admitting: Internal Medicine

## 2020-07-06 ENCOUNTER — Other Ambulatory Visit: Payer: Self-pay | Admitting: Hematology & Oncology

## 2020-07-06 ENCOUNTER — Other Ambulatory Visit: Payer: Self-pay | Admitting: Internal Medicine

## 2020-07-06 DIAGNOSIS — C50212 Malignant neoplasm of upper-inner quadrant of left female breast: Secondary | ICD-10-CM

## 2020-07-06 DIAGNOSIS — Z170 Estrogen receptor positive status [ER+]: Secondary | ICD-10-CM

## 2020-07-08 ENCOUNTER — Other Ambulatory Visit: Payer: Self-pay | Admitting: Internal Medicine

## 2020-10-25 ENCOUNTER — Other Ambulatory Visit: Payer: Self-pay | Admitting: Hematology & Oncology

## 2020-10-25 DIAGNOSIS — C50212 Malignant neoplasm of upper-inner quadrant of left female breast: Secondary | ICD-10-CM

## 2020-10-25 DIAGNOSIS — Z170 Estrogen receptor positive status [ER+]: Secondary | ICD-10-CM

## 2021-06-26 ENCOUNTER — Other Ambulatory Visit: Payer: Self-pay | Admitting: Hematology & Oncology

## 2021-06-26 DIAGNOSIS — Z170 Estrogen receptor positive status [ER+]: Secondary | ICD-10-CM
